# Patient Record
Sex: Female | Born: 1935 | Race: White | Hispanic: No | Smoking: Never smoker
Health system: Southern US, Community
[De-identification: ages and names within clinical notes are randomized; demographics above are authoritative.]

## PROBLEM LIST (undated history)

## (undated) DIAGNOSIS — N189 Chronic kidney disease, unspecified: Secondary | ICD-10-CM

## (undated) DIAGNOSIS — R001 Bradycardia, unspecified: Secondary | ICD-10-CM

## (undated) DIAGNOSIS — I5030 Unspecified diastolic (congestive) heart failure: Secondary | ICD-10-CM

## (undated) DIAGNOSIS — E039 Hypothyroidism, unspecified: Secondary | ICD-10-CM

## (undated) DIAGNOSIS — M5136 Other intervertebral disc degeneration, lumbar region: Secondary | ICD-10-CM

## (undated) DIAGNOSIS — M4120 Other idiopathic scoliosis, site unspecified: Secondary | ICD-10-CM

## (undated) DIAGNOSIS — I509 Heart failure, unspecified: Secondary | ICD-10-CM

## (undated) DIAGNOSIS — I480 Paroxysmal atrial fibrillation: Secondary | ICD-10-CM

## (undated) DIAGNOSIS — M549 Dorsalgia, unspecified: Secondary | ICD-10-CM

## (undated) DIAGNOSIS — T8859XA Other complications of anesthesia, initial encounter: Secondary | ICD-10-CM

## (undated) DIAGNOSIS — I4891 Unspecified atrial fibrillation: Secondary | ICD-10-CM

## (undated) DIAGNOSIS — M810 Age-related osteoporosis without current pathological fracture: Secondary | ICD-10-CM

## (undated) DIAGNOSIS — M51369 Other intervertebral disc degeneration, lumbar region without mention of lumbar back pain or lower extremity pain: Secondary | ICD-10-CM

## (undated) DIAGNOSIS — G459 Transient cerebral ischemic attack, unspecified: Secondary | ICD-10-CM

## (undated) DIAGNOSIS — R011 Cardiac murmur, unspecified: Secondary | ICD-10-CM

## (undated) DIAGNOSIS — I10 Essential (primary) hypertension: Secondary | ICD-10-CM

## (undated) DIAGNOSIS — G500 Trigeminal neuralgia: Secondary | ICD-10-CM

## (undated) DIAGNOSIS — G8929 Other chronic pain: Secondary | ICD-10-CM

## (undated) DIAGNOSIS — T4145XA Adverse effect of unspecified anesthetic, initial encounter: Secondary | ICD-10-CM

## (undated) DIAGNOSIS — E079 Disorder of thyroid, unspecified: Secondary | ICD-10-CM

## (undated) DIAGNOSIS — M1990 Unspecified osteoarthritis, unspecified site: Secondary | ICD-10-CM

## (undated) DIAGNOSIS — J90 Pleural effusion, not elsewhere classified: Secondary | ICD-10-CM

## (undated) DIAGNOSIS — Z95810 Presence of automatic (implantable) cardiac defibrillator: Secondary | ICD-10-CM

## (undated) DIAGNOSIS — R2681 Unsteadiness on feet: Secondary | ICD-10-CM

## (undated) DIAGNOSIS — I639 Cerebral infarction, unspecified: Secondary | ICD-10-CM

## (undated) DIAGNOSIS — G9340 Encephalopathy, unspecified: Secondary | ICD-10-CM

## (undated) DIAGNOSIS — I5033 Acute on chronic diastolic (congestive) heart failure: Secondary | ICD-10-CM

## (undated) DIAGNOSIS — J189 Pneumonia, unspecified organism: Secondary | ICD-10-CM

## (undated) DIAGNOSIS — K9041 Non-celiac gluten sensitivity: Secondary | ICD-10-CM

## (undated) HISTORY — DX: Non-celiac gluten sensitivity: K90.41

## (undated) HISTORY — DX: Other intervertebral disc degeneration, lumbar region without mention of lumbar back pain or lower extremity pain: M51.369

## (undated) HISTORY — DX: Unsteadiness on feet: R26.81

## (undated) HISTORY — PX: PACEMAKER INSERTION: SHX728

## (undated) HISTORY — DX: Other idiopathic scoliosis, site unspecified: M41.20

## (undated) HISTORY — DX: Chronic kidney disease, unspecified: N18.9

## (undated) HISTORY — DX: Encephalopathy, unspecified: G93.40

## (undated) HISTORY — PX: TOENAIL AVULSION: SHX1074

## (undated) HISTORY — PX: CATARACT EXTRACTION W/ INTRAOCULAR LENS  IMPLANT, BILATERAL: SHX1307

## (undated) HISTORY — DX: Other intervertebral disc degeneration, lumbar region: M51.36

---
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
## 2009-12-25 DIAGNOSIS — J90 Pleural effusion, not elsewhere classified: Secondary | ICD-10-CM

## 2009-12-25 HISTORY — DX: Pleural effusion, not elsewhere classified: J90

## 2013-08-25 HISTORY — PX: LOOP RECORDER IMPLANT: SHX5954

## 2014-07-14 ENCOUNTER — Ambulatory Visit
Admission: RE | Admit: 2014-07-14 | Discharge: 2014-07-14 | Disposition: A | Discharge status: Home / Self Care | Payer: PRIVATE HEALTH INSURANCE | Source: Ambulatory Visit | Attending: Family Medicine | Admitting: Family Medicine

## 2014-07-14 ENCOUNTER — Other Ambulatory Visit: Payer: Self-pay | Admitting: Family Medicine

## 2014-07-14 DIAGNOSIS — M545 Low back pain, unspecified: Secondary | ICD-10-CM

## 2014-12-13 ENCOUNTER — Emergency Department (HOSPITAL_COMMUNITY): Payer: Medicare PPO

## 2014-12-13 ENCOUNTER — Encounter (HOSPITAL_COMMUNITY): Payer: Self-pay | Admitting: Nurse Practitioner

## 2014-12-13 ENCOUNTER — Inpatient Hospital Stay (HOSPITAL_COMMUNITY)
Admission: EM | Admit: 2014-12-13 | Discharge: 2014-12-16 | DRG: 193 | Payer: Medicare PPO | Attending: Internal Medicine | Admitting: Internal Medicine

## 2014-12-13 DIAGNOSIS — Z8673 Personal history of transient ischemic attack (TIA), and cerebral infarction without residual deficits: Secondary | ICD-10-CM

## 2014-12-13 DIAGNOSIS — J189 Pneumonia, unspecified organism: Secondary | ICD-10-CM | POA: Diagnosis not present

## 2014-12-13 DIAGNOSIS — I480 Paroxysmal atrial fibrillation: Secondary | ICD-10-CM | POA: Diagnosis present

## 2014-12-13 DIAGNOSIS — R0602 Shortness of breath: Secondary | ICD-10-CM | POA: Diagnosis present

## 2014-12-13 DIAGNOSIS — I5031 Acute diastolic (congestive) heart failure: Secondary | ICD-10-CM | POA: Diagnosis present

## 2014-12-13 DIAGNOSIS — I4891 Unspecified atrial fibrillation: Secondary | ICD-10-CM | POA: Diagnosis present

## 2014-12-13 DIAGNOSIS — I509 Heart failure, unspecified: Secondary | ICD-10-CM

## 2014-12-13 DIAGNOSIS — G500 Trigeminal neuralgia: Secondary | ICD-10-CM

## 2014-12-13 DIAGNOSIS — M419 Scoliosis, unspecified: Secondary | ICD-10-CM | POA: Diagnosis present

## 2014-12-13 DIAGNOSIS — R232 Flushing: Secondary | ICD-10-CM

## 2014-12-13 DIAGNOSIS — R609 Edema, unspecified: Secondary | ICD-10-CM

## 2014-12-13 DIAGNOSIS — G459 Transient cerebral ischemic attack, unspecified: Secondary | ICD-10-CM

## 2014-12-13 HISTORY — DX: Trigeminal neuralgia: G50.0

## 2014-12-13 HISTORY — DX: Pneumonia, unspecified organism: J18.9

## 2014-12-13 HISTORY — DX: Transient cerebral ischemic attack, unspecified: G45.9

## 2014-12-13 LAB — I-STAT TROPONIN, ED: Troponin i, poc: 0 ng/mL (ref 0.00–0.08)

## 2014-12-13 LAB — CBC
HEMATOCRIT: 38.9 % (ref 36.0–46.0)
HEMOGLOBIN: 13.3 g/dL (ref 12.0–15.0)
MCH: 31.2 pg (ref 26.0–34.0)
MCHC: 34.2 g/dL (ref 30.0–36.0)
MCV: 91.3 fL (ref 78.0–100.0)
Platelets: 233 10*3/uL (ref 150–400)
RBC: 4.26 MIL/uL (ref 3.87–5.11)
RDW: 14.4 % (ref 11.5–15.5)
WBC: 9.3 10*3/uL (ref 4.0–10.5)

## 2014-12-13 LAB — BASIC METABOLIC PANEL
Anion gap: 14 (ref 5–15)
BUN: 12 mg/dL (ref 6–23)
CALCIUM: 8.7 mg/dL (ref 8.4–10.5)
CO2: 24 mEq/L (ref 19–32)
Chloride: 94 mEq/L — ABNORMAL LOW (ref 96–112)
Creatinine, Ser: 0.43 mg/dL — ABNORMAL LOW (ref 0.50–1.10)
GFR calc Af Amer: >90 mL/min (ref >90)
GFR, EST NON AFRICAN AMERICAN: 85 mL/min — AB (ref >90)
GLUCOSE: 80 mg/dL (ref 70–99)
Potassium: 4.2 mEq/L (ref 3.7–5.3)
Sodium: 132 mEq/L — ABNORMAL LOW (ref 137–147)

## 2014-12-13 LAB — CBG MONITORING, ED
GLUCOSE-CAPILLARY: 69 mg/dL — AB (ref 70–99)
GLUCOSE-CAPILLARY: 116 mg/dL — AB (ref 70–99)

## 2014-12-13 LAB — PRO B NATRIURETIC PEPTIDE: Pro B Natriuretic peptide (BNP): 2339 pg/mL — ABNORMAL HIGH (ref 0–450)

## 2014-12-13 MED ORDER — FUROSEMIDE 10 MG/ML IJ SOLN
40.0000 mg | Freq: Two times a day (BID) | INTRAMUSCULAR | Status: DC
  Administered 2014-12-13 – 2014-12-15 (×4): 40 mg via INTRAVENOUS
  Filled 2014-12-13 (×6): qty 4

## 2014-12-13 MED ORDER — FUROSEMIDE 10 MG/ML IJ SOLN
40.0000 mg | Freq: Once | INTRAMUSCULAR | Status: AC
  Administered 2014-12-13: 40 mg via INTRAVENOUS
  Filled 2014-12-13: qty 4

## 2014-12-13 MED ORDER — POLYETHYLENE GLYCOL 3350 17 G PO PACK
17.0000 g | PACK | Freq: Every day | ORAL | Status: DC | PRN
  Filled 2014-12-13: qty 1

## 2014-12-13 MED ORDER — METOPROLOL SUCCINATE ER 25 MG PO TB24: 25.0000 mg | ORAL_TABLET | Freq: Every day | ORAL | Status: DC

## 2014-12-13 MED ORDER — ACETAMINOPHEN 650 MG RE SUPP
650.0000 mg | Freq: Four times a day (QID) | RECTAL | Status: DC | PRN
Start: 2014-12-13 — End: 2014-12-16

## 2014-12-13 MED ORDER — PROGESTERONE MICRONIZED 100 MG PO CAPS
100.0000 mg | ORAL_CAPSULE | Freq: Every day | ORAL | Status: DC
  Administered 2014-12-13: 100 mg via ORAL
  Filled 2014-12-13 (×4): qty 1

## 2014-12-13 MED ORDER — CYCLOSPORINE 0.05 % OP EMUL
1.0000 [drp] | Freq: Every day | OPHTHALMIC | Status: DC
  Administered 2014-12-13 – 2014-12-15 (×3): 1 [drp] via OPHTHALMIC
  Filled 2014-12-13 (×4): qty 1

## 2014-12-13 MED ORDER — SODIUM CHLORIDE 0.9 % IJ SOLN
3.0000 mL | Freq: Two times a day (BID) | INTRAMUSCULAR | Status: DC
  Administered 2014-12-13 – 2014-12-16 (×4): 3 mL via INTRAVENOUS

## 2014-12-13 MED ORDER — SODIUM CHLORIDE 0.9 % IJ SOLN
3.0000 mL | Freq: Two times a day (BID) | INTRAMUSCULAR | Status: DC
  Administered 2014-12-13 – 2014-12-16 (×6): 3 mL via INTRAVENOUS

## 2014-12-13 MED ORDER — LORATADINE 10 MG PO TABS
10.0000 mg | ORAL_TABLET | Freq: Every day | ORAL | Status: DC
Start: 2014-12-13 — End: 2014-12-16
  Administered 2014-12-13 – 2014-12-14 (×2): 10 mg via ORAL
  Filled 2014-12-13 (×4): qty 1

## 2014-12-13 MED ORDER — DEXTROSE 5 % IV SOLN
500.0000 mg | INTRAVENOUS | Status: DC
  Administered 2014-12-13 – 2014-12-15 (×3): 500 mg via INTRAVENOUS
  Filled 2014-12-13 (×4): qty 500

## 2014-12-13 MED ORDER — CLOPIDOGREL BISULFATE 75 MG PO TABS
75.0000 mg | ORAL_TABLET | Freq: Every day | ORAL | Status: DC
  Administered 2014-12-13 – 2014-12-15 (×3): 75 mg via ORAL
  Filled 2014-12-13 (×4): qty 1

## 2014-12-13 MED ORDER — LEVOFLOXACIN IN D5W 750 MG/150ML IV SOLN
750.0000 mg | Freq: Once | INTRAVENOUS | Status: AC
  Administered 2014-12-13: 750 mg via INTRAVENOUS
  Filled 2014-12-13: qty 150

## 2014-12-13 MED ORDER — ALUM & MAG HYDROXIDE-SIMETH 200-200-20 MG/5ML PO SUSP: 30.0000 mL | Freq: Four times a day (QID) | ORAL | Status: DC | PRN

## 2014-12-13 MED ORDER — METOPROLOL SUCCINATE ER 50 MG PO TB24
50.0000 mg | ORAL_TABLET | Freq: Every day | ORAL | Status: DC
  Administered 2014-12-13 – 2014-12-15 (×3): 50 mg via ORAL
  Filled 2014-12-13 (×3): qty 1

## 2014-12-13 MED ORDER — ONDANSETRON HCL 4 MG/2ML IJ SOLN: 4.0000 mg | Freq: Four times a day (QID) | INTRAMUSCULAR | Status: DC | PRN

## 2014-12-13 MED ORDER — ENOXAPARIN SODIUM 40 MG/0.4ML ~~LOC~~ SOLN: 40.0000 mg | SUBCUTANEOUS | Status: DC

## 2014-12-13 MED ORDER — SODIUM CHLORIDE 0.9 % IJ SOLN: 3.0000 mL | INTRAMUSCULAR | Status: DC | PRN

## 2014-12-13 MED ORDER — PREGABALIN 25 MG PO CAPS
75.0000 mg | ORAL_CAPSULE | Freq: Two times a day (BID) | ORAL | Status: DC
  Administered 2014-12-13 – 2014-12-16 (×6): 75 mg via ORAL
  Filled 2014-12-13 (×6): qty 3

## 2014-12-13 MED ORDER — ESTRADIOL 1 MG PO TABS
0.5000 mg | ORAL_TABLET | Freq: Every day | ORAL | Status: DC
  Administered 2014-12-13 – 2014-12-14 (×2): 0.5 mg via ORAL
  Filled 2014-12-13 (×4): qty 0.5

## 2014-12-13 MED ORDER — SELENIUM 50 MCG PO TABS
50.0000 ug | ORAL_TABLET | Freq: Every day | ORAL | Status: DC
  Administered 2014-12-13 – 2014-12-15 (×2): 50 ug via ORAL
  Filled 2014-12-13 (×4): qty 1

## 2014-12-13 MED ORDER — DEXTROSE 5 % IV SOLN
1.0000 g | INTRAVENOUS | Status: DC
  Administered 2014-12-13 – 2014-12-15 (×3): 1 g via INTRAVENOUS
  Filled 2014-12-13 (×4): qty 10

## 2014-12-13 MED ORDER — SODIUM CHLORIDE 0.9 % IV SOLN: 250.0000 mL | INTRAVENOUS | Status: DC | PRN

## 2014-12-13 MED ORDER — ONDANSETRON HCL 4 MG PO TABS: 4.0000 mg | ORAL_TABLET | Freq: Four times a day (QID) | ORAL | Status: DC | PRN

## 2014-12-13 MED ORDER — DIPHENHYDRAMINE HCL 50 MG/ML IJ SOLN
25.0000 mg | Freq: Four times a day (QID) | INTRAMUSCULAR | Status: DC | PRN
  Administered 2014-12-13: 25 mg via INTRAVENOUS
  Filled 2014-12-13: qty 1

## 2014-12-13 MED ORDER — METOPROLOL SUCCINATE ER 25 MG PO TB24
25.0000 mg | ORAL_TABLET | Freq: Once | ORAL | Status: AC
  Administered 2014-12-13: 25 mg via ORAL

## 2014-12-13 MED ORDER — ACETAMINOPHEN 325 MG PO TABS: 650.0000 mg | ORAL_TABLET | Freq: Four times a day (QID) | ORAL | Status: DC | PRN

## 2014-12-13 NOTE — ED Notes (Signed)
Called to give report. RN to call back. 

## 2014-12-13 NOTE — ED Provider Notes (Signed)
CSN: [id number]     Arrival date & time 12/13/14  1157 History   First MD Initiated Contact with Patient 12/13/14 1231     Chief Complaint  Patient presents with  . Shortness of Breath     (Consider location/radiation/quality/duration/timing/severity/associated sxs/prior Treatment) HPI  Pt is a 80yo with h/o irregular heart beat and TIA who presents for increased shortness of breath following pneumonia diagnosis 2 days ago. She reports that recently she has had some cough and runny nose/congestion but no fevers. She was placed on azithro for CAP, 2 days ago by her PCP.   Patient's daughter, Dr. Selena BattenWendy McNeil, was present at the bedside and reports she has had intermittent afib over the past few years but nothing sustained and has never been on anticoagulation. She is on metoprolol for primarily sinus tachycardia.  Past Medical History  Diagnosis Date  . Sinus tachycardia   . Pneumonia   . Scoliosis   . TIA (transient ischemic attack)    Past Surgical History  Procedure Laterality Date  . Tonsillectomy     No family history on file. History  Substance Use Topics  . Smoking status: Never Smoker   . Smokeless tobacco: Not on file  . Alcohol Use: No   OB History    No data available     Review of Systems No fever, CP. No n/v/d.   Allergies  Codeine and Xylocaine  Home Medications   Prior to Admission medications   Not on File   BP 137/82 mmHg  Pulse 104  Temp(Src) 97.8 F (36.6 C) (Oral)  Resp 20  Ht 4\' 7"  (1.397 m)  Wt 100 lb (45.36 kg)  BMI 23.24 kg/m2  SpO2 99% Physical Exam  ED Course  Procedures (including critical care time) Labs Review Labs Reviewed  CBC  BASIC METABOLIC PANEL  PRO B NATRIURETIC PEPTIDE  I-STAT TROPOININ, ED    Imaging Review Dg Chest 2 View  12/13/2014   CLINICAL DATA:  Cough and difficulty breathing for 2 days  EXAM: CHEST  2 VIEW  COMPARISON:  December 11, 2014  FINDINGS: There is underlying emphysematous change. There is  again noted opacity in the lateral left base, probably representing a small area of consolidation with small effusion. Lungs elsewhere clear. Heart is mildly enlarged with pulmonary vascularity within normal limits. No adenopathy. Bones are osteoporotic. There is a focal area of calcification in the left carotid artery.  IMPRESSION: Opacity lateral left base, upright representing combination of focal infiltrate with small effusion. Lungs elsewhere clear. Underlying emphysema. Stable cardiomegaly.   Electronically Signed   By: Bretta BangWilliam  Woodruff M.D.   On: 12/13/2014 13:22     EKG Interpretation   Date/Time:  Sunday December 13 2014 12:09:57 EST Ventricular Rate:  113 PR Interval:    QRS Duration: 92 QT Interval:  326 QTC Calculation: 447 R Axis:   -75 Text Interpretation:  Atrial fibrillation with rapid ventricular response  Left anterior fascicular block Possible Anterior infarct , age  undetermined Abnormal ECG Confirmed by BEATON  MD, ROBERT ([id number]) on  12/13/2014 12:32:05 PM      MDM   Final diagnoses:  SOB (shortness of breath)   SOB, found to be in afib with RVR. Unclear whether pneumonia or afib is the primary etiology. CXR with LLL infiltrate plus small effusion. Awaiting BNP, trop, CBC, BMP.  BNP elevation and SOB with afib concerning for new onset heart failure. Lasix ordered. Will consult cardiology and call [facility] for admission  Abram SanderElena M Yehudah Standing, MD 12/14/14 1723  Nelia Shiobert L Beaton, MD 12/21/14 [phone]

## 2014-12-13 NOTE — ED Notes (Signed)
Pt has returned from being out of the department; pt placed back on the monitor, continuous pulse oximetry and blood pressure cuff

## 2014-12-13 NOTE — H&P (Addendum)
[facility] History and Physical  Melanie Chancellorrene H Boman XBJ:[id number] DOB: [birth year] DOA: 12/13/2014   PCP: Gaye AlkenBARNES,ELIZABETH STEWART, MD    Chief Complaint: Chest tightness  HPI: Melanie Obrien is a 80 y.o. female with past medical history of TIA, trigeminal neuralgia, hot flashes and severe scoliosis who presents with an episode of tightness in the center of her chest associated with some shortness of breath. The patient's daughter who is an M.D. is at the bedside and assisting with history.   The patient came to the ER because of the chest tightness and was noted to be in A. fib with RVR. Rate has slowed down spontaneously and is now in the 80s. She also gives a history of a cough which has been going on for a few days now. According to the daughter who is at the bedside, she coughed up some bloody sputum yesterday but otherwise has had a nonproductive cough. The patient does not have any other type of chest pain and has not had any fevers chills or sweats or other shortness of breath. She has had pneumonia once in the past with some loculations secondary to mucous plugging. Is not have a history of COPD or asthma but most likely has restrictive lung disease secondary to scoliosis.  Of note the daughter would also like to mention that the patient tends to have a "personality change" towards the evening. The patient does not have any dementia or problems with sundowning.   General: The patient denies anorexia, fever, weight loss Cardiac: Denie syncope, palpitations, pedal edema  Respiratory: Denies wheezing GI: Denies severe indigestion/heartburn, abdominal pain, nausea, vomiting, diarrhea and constipation- has Gluten intolerance GU: Denies hematuria, incontinence, dysuria  Musculoskeletal: has arthritis - does not take any medication for it Skin: Denies suspicious skin lesions Neurologic: Denies focal weakness or numbness, change in vision Psychiatry: Denies depression or anxiety.    Past Medical History  Diagnosis Date  . Pneumonia   . Scoliosis   . TIA (transient ischemic attack)   . Trigeminal neuralgia of left side of face after tooth extraction    . Hot flashes     Past Surgical History  Procedure Laterality Date  . Tonsillectomy      Social History: never smoked, does not drink alcohol Lives in independent living at [facility].  Uses a walker for her posture.    Allergies  Allergen Reactions  . Codeine Other (See Comments)    unknown  . Xylocaine [Lidocaine Hcl] Other (See Comments)    Uncontrolled shaking    Family history- none   Prior to Admission medications   Medication Sig Start Date End Date Taking? Authorizing Provider  Ascorbic Acid (VITAMIN C PO) Take 1 tablet by mouth daily.   Yes Historical Provider, MD  azithromycin (ZITHROMAX) 250 MG tablet Take 250-500 mg by mouth daily. 500mg  on the first day, then take 1 tablet daily   Yes Historical Provider, MD  b complex vitamins tablet Take 1 tablet by mouth daily.   Yes Historical Provider, MD  BETA CAROTENE PO Take 1 tablet by mouth daily.   Yes Historical Provider, MD  CALCIUM PO Take 1 tablet by mouth daily.   Yes Historical Provider, MD  CHROMIUM PO Take 1 tablet by mouth daily.   Yes Historical Provider, MD  clopidogrel (PLAVIX) 75 MG tablet Take 75 mg by mouth daily.   Yes Historical Provider, MD  Cyanocobalamin (VITAMIN B 12 PO) Take 1 tablet by mouth daily.   Yes Historical  Provider, MD  cycloSPORINE (RESTASIS) 0.05 % ophthalmic emulsion Place 1 drop into both eyes daily.   Yes Historical Provider, MD  estradiol (ESTRACE) 1 MG tablet Take 0.5 mg by mouth daily.   Yes Historical Provider, MD  fexofenadine (ALLEGRA) 180 MG tablet Take 180 mg by mouth daily.   Yes Historical Provider, MD  MAGNESIUM PO Take 1 tablet by mouth daily.   Yes Historical Provider, MD  metoprolol succinate (TOPROL-XL) 25 MG 24 hr tablet Take 25 mg by mouth daily.   Yes Historical Provider, MD  Multiple  Vitamins-Minerals (MULTIVITAMIN PO) Take 1 tablet by mouth daily.   Yes Historical Provider, MD  Multiple Vitamins-Minerals (ZINC PO) Take 1 tablet by mouth daily.   Yes Historical Provider, MD  PANTOTHENIC ACID PO Take 1 tablet by mouth daily.   Yes Historical Provider, MD  pregabalin (LYRICA) 75 MG capsule Take 75 mg by mouth 2 (two) times daily.   Yes Historical Provider, MD  progesterone (PROMETRIUM) 100 MG capsule Take 100 mg by mouth daily.   Yes Historical Provider, MD  selenium 50 MCG TABS tablet Take 50 mcg by mouth daily.   Yes Historical Provider, MD  VITAMIN A PO Take 1 tablet by mouth daily.   Yes Historical Provider, MD  VITAMIN E PO Take 1 tablet by mouth daily.   Yes Historical Provider, MD     Physical Exam: Filed Vitals:   12/13/14 1525 12/13/14 1530 12/13/14 1630 12/13/14 1706  BP: 148/83 130/87 123/53 129/73  Pulse: 111 107 86 85  Temp:      TempSrc:      Resp: 18 17 10 17   Height:      Weight:      SpO2: 94% 95% 95% 94%     General: Awake alert oriented 3, heart of hearing, no acute distress HEENT: Normocephalic and Atraumatic, Mucous membranes pink -tongue is dry                PERRLA; EOM intact; No scleral icterus,                 Nares: Patent, Oropharynx: Clear, Fair Dentition                 Neck: FROM, no cervical lymphadenopathy, thyromegaly, carotid bruit or JVD;  Breasts: deferred CHEST WALL: No tenderness  CHEST: Normal respiration, mild crackles in the left lung base-no wheezing-oxygenating in the mid 90s on room air HEART: Regular rate and rhythm; no murmurs rubs or gallops  BACK: + scoliosis; no CVA tenderness  ABDOMEN: Positive Bowel Sounds, soft, non-tender; no masses, no organomegaly Rectal Exam: deferred EXTREMITIES: No cyanosis, clubbing, or edema Genitalia: not examined  SKIN:  no rash or ulceration  CNS: Alert and Oriented x 4, Nonfocal exam, CN 2-12 intact  Labs on Admission:  Basic Metabolic Panel:  Recent Labs Lab  12/13/14 1405  NA 132*  K 4.2  CL 94*  CO2 24  GLUCOSE 80  BUN 12  CREATININE 0.43*  CALCIUM 8.7   Liver Function Tests: No results for input(s): AST, ALT, ALKPHOS, BILITOT, PROT, ALBUMIN in the last 168 hours. No results for input(s): LIPASE, AMYLASE in the last 168 hours. No results for input(s): AMMONIA in the last 168 hours. CBC:  Recent Labs Lab 12/13/14 1405  WBC 9.3  HGB 13.3  HCT 38.9  MCV 91.3  PLT 233   Cardiac Enzymes: No results for input(s): CKTOTAL, CKMB, CKMBINDEX, TROPONINI in the last 168 hours.  BNP (  last 3 results)  Recent Labs  12/13/14 1405  PROBNP 2339.0*   CBG:  Recent Labs Lab 12/13/14 1413 12/13/14 1537  GLUCAP 69* 116*    Radiological Exams on Admission: Dg Chest 2 View  12/13/2014   CLINICAL DATA:  Cough and difficulty breathing for 2 days  EXAM: CHEST  2 VIEW  COMPARISON:  December 11, 2014  FINDINGS: There is underlying emphysematous change. There is again noted opacity in the lateral left base, probably representing a small area of consolidation with small effusion. Lungs elsewhere clear. Heart is mildly enlarged with pulmonary vascularity within normal limits. No adenopathy. Bones are osteoporotic. There is a focal area of calcification in the left carotid artery.  IMPRESSION: Opacity lateral left base, upright representing combination of focal infiltrate with small effusion. Lungs elsewhere clear. Underlying emphysema. Stable cardiomegaly.   Electronically Signed   By: Bretta Bang M.D.   On: 12/13/2014 13:22    EKG: Independently reviewed. A-fib at 115 bpm  Assessment/Plan Principal Problem:   Pneumonia -Community acquired- left lower lobe -will place her on Levaquin -Other orders per pneumonia protocol include strep and Legionella antigens, sputum culture and HIV  Active Problems:   Atrial fibrillation/ CHF unspecified -Per cardiology notes, the plan is to increase Toprol from 25 mg to 50 mg daily-I will give her a  dose of 25 now as she already took 25 this morning and then increase the dose for tomorrow morning -She is currently rate controlled -Will place her on Lovenox for anticoagulation until a decision can be made in regards to more permanent anticoagulation -2-D echo has been ordered -Thyroid studies will be ordered for tomorrow morning -Cardiology is following- lasix ordered by cardiology- f/u on K+    TIA (transient ischemic attack) -Noted that she is not on an aspirin or a statin    Trigeminal neuralgia of left side of face -Continue Lyrica    Hot flashes -Continue estrogen and progesterone  Personality changes in the evening - Per history obtained from daughter, this is not sundowning  Consulted: [facility]  Code Status: full code  Family Communication: Selena Batten, MD - daughter ([place])- [phone] is at the bedside in case has been discussed with DVT Prophylaxis: Lovenox  Time spent: 65 minutes  Diontre Harps, MD [facility]  If 7PM-7AM, please contact night-coverage [email or web address] 12/13/2014, 5:30 PM

## 2014-12-13 NOTE — Progress Notes (Signed)
ANTIBIOTIC CONSULT NOTE - INITIAL  Pharmacy Consult for Ceftriaxone and Azithromycin Indication: CAP  Allergies  Allergen Reactions  . Codeine Other (See Comments)    unknown  . Levaquin [Levofloxacin In D5w] Itching  . Xylocaine [Lidocaine Hcl] Other (See Comments)    Uncontrolled shaking    Patient Measurements: Height: 4\' 7"  (139.7 cm) Weight: 100 lb (45.36 kg) IBW/kg (Calculated) : 34 Adjusted Body Weight:   Vital Signs: Temp: 97.8 F (36.6 C) (12/20 1209) Temp Source: Oral (12/20 1209) BP: 126/89 mmHg (12/20 1800) Pulse Rate: 120 (12/20 1800) Intake/Output from previous day:   Intake/Output from this shift: Total I/O In: -  Out: 2325 [Urine:2325]  Labs:  Recent Labs  12/13/14 1405  WBC 9.3  HGB 13.3  PLT 233  CREATININE 0.43*   Estimated Creatinine Clearance: 26.8 mL/min (by C-G formula based on Cr of 0.43). No results for input(s): VANCOTROUGH, VANCOPEAK, VANCORANDOM, GENTTROUGH, GENTPEAK, GENTRANDOM, TOBRATROUGH, TOBRAPEAK, TOBRARND, AMIKACINPEAK, AMIKACINTROU, AMIKACIN in the last 72 hours.   Microbiology: No results found for this or any previous visit (from the past 720 hour(s)).  Medical History: Past Medical History  Diagnosis Date  . Sinus tachycardia   . Pneumonia   . Scoliosis   . TIA (transient ischemic attack)   . Trigeminal neuralgia of left side of face 12/13/2014  . Hot flashes 12/13/2014    Medications:  See electronic med rec  Assessment: 89yof admitted for possible CAP. Patient was started on Levofloxacin but developed an allergic reaction. Pharmacy now consulted to start Azithromycin and Ceftriaxone for CAP. - Afeb, WBC wnl - Crcl 27    Plan:  1. Ceftriaxone 1g IV q24h 2. Azithromycin 500mg  IV q24h 3. No renal dose adjustment needed on these antibiotics. Pharmacy will sign off. Please reconsult if additional assistance is needed.   Cleon DewDulaney, [place] Robert  [phone] 12/13/2014,6:19 PM

## 2014-12-13 NOTE — ED Provider Notes (Signed)
Cardiology has called, believe pna is primary process. Dr. Butler Denmarkizwan w/ [facility] will admit, will start levaquin. Pt feeling somewhat improved after lasix.   1. Acute heart failure, unspecified heart failure type   2. SOB (shortness of breath)   3. Atrial fibrillation with RVR   4. CAP (community acquired pneumonia)      Toy CookeyMegan Haja Crego, MD 12/13/14 [phone]

## 2014-12-13 NOTE — Consult Note (Signed)
CARDIOLOGY CONSULT NOTE      Patient ID: Melanie Obrien MRN: [id number] DOB/AGE: 07/30/1919 80 y.o.  Admit date: 12/13/2014 Referring Rudean CurtPhysicianMegan Docherty, MD Primary Elson ClanPhysicianBARNES,ELIZABETH STEWART, MD Primary Cardiologist Eldridge DaceVaranasi Reason for Consultation shortness of breath  HPI: 80 year old woman who is known to me. She has had intermittent atrial fibrillation in the past. Over the course of the past several days, she had been short of breath. She was seen by her primary care doctor. A chest x-ray was done which showed a left lower lobe pneumonia. She was started on azithromycin. She took several doses but did not have any improvement. She noticed some increased palpitations as well. She noticed some lower extremity edema. Earlier today, she called her daughter because she could not walk within her house without being significantly short of breath. She presented to the emergency room. She has received a dose of Lasix. Repeat chest x-ray was performed.  Review of systems complete and found to be negative unless listed above   Past Medical History  Diagnosis Date  . Sinus tachycardia   . Pneumonia   . Scoliosis   . TIA (transient ischemic attack)     No family history on file.  History   Social History  . Marital Status: Widowed    Spouse Name: N/A    Number of Children: N/A  . Years of Education: N/A   Occupational History  . Not on file.   Social History Main Topics  . Smoking status: Never Smoker   . Smokeless tobacco: Not on file  . Alcohol Use: No  . Drug Use: No  . Sexual Activity: Not on file   Other Topics Concern  . Not on file   Social History Narrative  . No narrative on file    Past Surgical History  Procedure Laterality Date  . Tonsillectomy        (Not in a hospital admission)  Physical Exam: Vitals:   Filed Vitals:   12/13/14 1445 12/13/14 1524 12/13/14 1525 12/13/14 1530  BP: 151/77 135/111 148/83 130/87  Pulse: 105 107 111 107    Temp:      TempSrc:      Resp: 15 34 18 17  Height:      Weight:      SpO2: 96% 96% 94% 95%   I&O's:   Intake/Output Summary (Last 24 hours) at 12/13/14 1634 Last data filed at 12/13/14 1609  Gross per 24 hour  Intake      0 ml  Output   1050 ml  Net  -1050 ml   Physical exam: Frail, comfortable [place]/AT EOMI No JVD, No carotid bruit Irregularly irregular S1S2  No wheezing Soft. NT, nondistended No edema. No focal motor or sensory deficits Normal affect No rash  Labs:   Lab Results  Component Value Date   WBC 9.3 12/13/2014   HGB 13.3 12/13/2014   HCT 38.9 12/13/2014   MCV 91.3 12/13/2014   PLT 233 12/13/2014    Recent Labs Lab 12/13/14 1405  NA 132*  K 4.2  CL 94*  CO2 24  BUN 12  CREATININE 0.43*  CALCIUM 8.7  GLUCOSE 80   No results found for: CKTOTAL, CKMB, CKMBINDEX, TROPONINI No results found for: CHOL No results found for: HDL No results found for: LDLCALC No results found for: TRIG No results found for: CHOLHDL No results found for: LDLDIRECT    Radiology:  Left lower lobe pneumonia EKG:  Atrial fibrillation with rapid ventricular response.  ASSESSMENT AND PLAN:    Pneumonia: She may need a different antibiotic compared to azithromycin. I personally reviewed her chest x-ray from today as well as 2 days ago. Will defer to the hospitalist service.  Atrial fibrillation with rapid ventricular response: We'll obtain echocardiogram. I suspect some of this is diastolic dysfunction due to increased heart rate. The trigger for her atrial fibrillation was likely the pneumonia. Will increase metoprolol to metoprolol XL 50 mg daily at this point.  Edema: She has received IV Lasix. She will likely need several doses of IV Lasix to help with her fluid overload. BNP is also elevated.  Will follow along. Signed:   Fredric MareJay S. Yamil Oelke, MD, [facility] 12/13/2014, 4:34 PM

## 2014-12-13 NOTE — ED Notes (Addendum)
Pt started to have red streaking along veins after Levaquin was started. Pt c/o itching at the site. Antibiotic stopped and Dr Butler Denmarkizwan notified. IV is still patent with good blood return.

## 2014-12-13 NOTE — ED Notes (Addendum)
Daughter brought the pt today for increasing sob over this weekend. She was seen by PCP last week and started on zpak for possible pneumonia. She c/o mild discomfort in chest. Daughter went to check on her today and she could not walk across room without becoming SOB and increased heart rate. She is A&OX4, BREATHING EASILY NOW

## 2014-12-14 ENCOUNTER — Encounter (HOSPITAL_COMMUNITY): Payer: Self-pay

## 2014-12-14 DIAGNOSIS — G500 Trigeminal neuralgia: Secondary | ICD-10-CM

## 2014-12-14 DIAGNOSIS — R0602 Shortness of breath: Secondary | ICD-10-CM

## 2014-12-14 DIAGNOSIS — I359 Nonrheumatic aortic valve disorder, unspecified: Secondary | ICD-10-CM

## 2014-12-14 DIAGNOSIS — G459 Transient cerebral ischemic attack, unspecified: Secondary | ICD-10-CM

## 2014-12-14 LAB — CBC
HEMATOCRIT: 35 % — AB (ref 36.0–46.0)
HEMOGLOBIN: 11.9 g/dL — AB (ref 12.0–15.0)
MCH: 31.2 pg (ref 26.0–34.0)
MCHC: 34 g/dL (ref 30.0–36.0)
MCV: 91.9 fL (ref 78.0–100.0)
Platelets: 221 10*3/uL (ref 150–400)
RBC: 3.81 MIL/uL — ABNORMAL LOW (ref 3.87–5.11)
RDW: 14.8 % (ref 11.5–15.5)
WBC: 8.2 10*3/uL (ref 4.0–10.5)

## 2014-12-14 LAB — HIV ANTIBODY (ROUTINE TESTING W REFLEX): HIV 1&2 Ab, 4th Generation: NONREACTIVE

## 2014-12-14 LAB — BASIC METABOLIC PANEL
Anion gap: 12 (ref 5–15)
BUN: 15 mg/dL (ref 6–23)
CHLORIDE: 97 meq/L (ref 96–112)
CO2: 26 meq/L (ref 19–32)
CREATININE: 0.62 mg/dL (ref 0.50–1.10)
Calcium: 8.6 mg/dL (ref 8.4–10.5)
GFR calc Af Amer: 87 mL/min — ABNORMAL LOW (ref >90)
GFR calc non Af Amer: 75 mL/min — ABNORMAL LOW (ref >90)
Glucose, Bld: 92 mg/dL (ref 70–99)
Potassium: 4.2 mEq/L (ref 3.7–5.3)
Sodium: 135 mEq/L — ABNORMAL LOW (ref 137–147)

## 2014-12-14 LAB — T4, FREE: Free T4: 1.2 ng/dL (ref 0.80–1.80)

## 2014-12-14 LAB — TSH: TSH: 3.49 u[IU]/mL (ref 0.350–4.500)

## 2014-12-14 MED ORDER — OFF THE BEAT BOOK
Freq: Once | Status: AC
  Administered 2014-12-14: 1
  Filled 2014-12-14: qty 1

## 2014-12-14 MED ORDER — ENOXAPARIN SODIUM 30 MG/0.3ML ~~LOC~~ SOLN
30.0000 mg | SUBCUTANEOUS | Status: DC
  Administered 2014-12-14 – 2014-12-15 (×2): 30 mg via SUBCUTANEOUS
  Filled 2014-12-14 (×3): qty 0.3

## 2014-12-14 NOTE — Progress Notes (Signed)
ANTICOAGULATION CONSULT NOTE - Follow Up Consult  Pharmacy Consult for Lovenox Indication: VTE prophylaxis  Allergies  Allergen Reactions  . Codeine Other (See Comments)    unknown  . Levaquin [Levofloxacin In D5w] Itching  . Xylocaine [Lidocaine Hcl] Other (See Comments)    Uncontrolled shaking    Patient Measurements: Height: 4\' 7"  (139.7 cm) Weight: 92 lb 5.1 oz (41.876 kg) (Scale B) IBW/kg (Calculated) : 34 Heparin Dosing Weight:    Vital Signs: Temp: 97.8 F (36.6 C) (12/21 0507) Temp Source: Oral (12/21 0507) BP: 119/58 mmHg (12/21 0507) Pulse Rate: 78 (12/21 0507)  Labs:  Recent Labs  12/13/14 1405 12/14/14 0427  HGB 13.3 11.9*  HCT 38.9 35.0*  PLT 233 221  CREATININE 0.43* 0.62    Estimated Creatinine Clearance: 25.8 mL/min (by C-G formula based on Cr of 0.62).  Assessment: SOB 80 y/o F admitted with PNA vs afib. CXR with infiltrate: Azithro/Rocephin.  Anticoagulation: Patient to begin LMWH for VTE prophylaxis. With weight <45 kg (at 41kg) and CrCl<30 (25), requires reduced dosing. Baseline Hgb 13.3>>11.9 this AM. Plts 221 ok.  Goal of Therapy:  VTE prophx. Monitor platelets by anticoagulation protocol: Yes   Plan:  Lovenox 30mg  SQ q24h. No further dosage adjustment required. Pharmacy will sign off.   Candela Krul S. Merilynn Finlandobertson, PharmD, [facility] Clinical Staff Pharmacist Pager [phone]  Misty Stanleyobertson, Anjolie Majer Stillinger 12/14/2014,8:48 AM

## 2014-12-14 NOTE — Progress Notes (Addendum)
Patient Name: Melanie Obrien Date of Encounter: 12/14/2014     Principal Problem:   Pneumonia Active Problems:   Atrial fibrillation   TIA (transient ischemic attack)   Trigeminal neuralgia of left side of face   Hot flashes    SUBJECTIVE  The patient feels better, improved SOB, no CP.  CURRENT MEDS . azithromycin  500 mg Intravenous Q24H  . cefTRIAXone (ROCEPHIN)  IV  1 g Intravenous Q24H  . clopidogrel  75 mg Oral QHS  . cycloSPORINE  1 drop Both Eyes QHS  . enoxaparin (LOVENOX) injection  30 mg Subcutaneous Q24H  . estradiol  0.5 mg Oral Daily  . furosemide  40 mg Intravenous BID  . loratadine  10 mg Oral Daily  . metoprolol succinate  50 mg Oral Daily  . pregabalin  75 mg Oral BID  . progesterone  100 mg Oral Daily  . selenium  50 mcg Oral Daily  . sodium chloride  3 mL Intravenous Q12H  . sodium chloride  3 mL Intravenous Q12H    OBJECTIVE  Filed Vitals:   12/13/14 1840 12/13/14 2140 12/14/14 0200 12/14/14 0507  BP: 146/81 117/71 118/69 119/58  Pulse: 127 81 78 78  Temp: 98.3 F (36.8 C) 98 F (36.7 C) 97.9 F (36.6 C) 97.8 F (36.6 C)  TempSrc: Oral Oral Oral Oral  Resp: 22  20 20   Height: 4\' 7"  (1.397 m)     Weight: 94 lb 2.2 oz (42.7 kg)   92 lb 5.1 oz (41.876 kg)  SpO2: 99% 96% 96% 100%    Intake/Output Summary (Last 24 hours) at 12/14/14 1315 Last data filed at 12/14/14 0421  Gross per 24 hour  Intake      0 ml  Output   5025 ml  Net  -5025 ml   Filed Weights   12/13/14 1209 12/13/14 1840 12/14/14 0507  Weight: 100 lb (45.36 kg) 94 lb 2.2 oz (42.7 kg) 92 lb 5.1 oz (41.876 kg)    PHYSICAL EXAM  General: Pleasant, NAD. Neuro: Alert and oriented X 3. Moves all extremities spontaneously. Psych: Normal affect. HEENT:  Normal  Neck: Supple without bruits or JVD. Lungs:  Resp regular and unlabored, CTA. Heart: RRR no s3, s4, or murmurs. Abdomen: Soft, non-tender, non-distended, BS + x 4.  Extremities: No clubbing, cyanosis or edema.  DP/PT/Radials 2+ and equal bilaterally.  Accessory Clinical Findings  CBC  Recent Labs  12/13/14 1405 12/14/14 0427  WBC 9.3 8.2  HGB 13.3 11.9*  HCT 38.9 35.0*  MCV 91.3 91.9  PLT 233 221   Basic Metabolic Panel  Recent Labs  12/13/14 1405 12/14/14 0427  NA 132* 135*  K 4.2 4.2  CL 94* 97  CO2 24 26  GLUCOSE 80 92  BUN 12 15  CREATININE 0.43* 0.62  CALCIUM 8.7 8.6     Recent Labs  12/14/14 0427  TSH 3.490    TELE  Now back in NSR- with some NSVT  Radiology/Studies  Dg Chest 2 View  12/13/2014   CLINICAL DATA:  Cough and difficulty breathing for 2 days  EXAM: CHEST  2 VIEW  COMPARISON:  December 11, 2014  FINDINGS: There is underlying emphysematous change. There is again noted opacity in the lateral left base, probably representing a small area of consolidation with small effusion. Lungs elsewhere clear. Heart is mildly enlarged with pulmonary vascularity within normal limits. No adenopathy. Bones are osteoporotic. There is a focal area of calcification in the left  carotid artery.  IMPRESSION: Opacity lateral left base, upright representing combination of focal infiltrate with small effusion. Lungs elsewhere clear. Underlying emphysema. Stable cardiomegaly.   Electronically Signed   By: Bretta BangWilliam  Woodruff M.D.   On: 12/13/2014 13:22    ASSESSMENT AND PLAN  Pneumonia: cont Abx per IM  Atrial fibrillation w/ RVR:  -- Spontaneously converted into NSR. -- Continue Toprol XL 50mg  qd  NSVT- noted on tele. HR 60s. Cannot really increase BB. Continue to monitor  -- Continue Toprol XL 50mg  qd  Edema: Continue IV Lasix 40mg  BID -- 2D ECHO performed and reading pending  Signed, Janetta HoraHOMPSON, KATHRYN R PA-C  Pager [phone]   The patient was seen, examined and discussed with Carlean JewsKatie Thompson, PA-C and I agree with the above.   80 year old female admitted with CAP, found to be in a-fib with RVR, now cardioverted spontaneously to SR with few episodes of nsVT - 4-7  cycles. There is no much for increasing BB as she is bradycardic at baseline. She is mildly fluid overloaded, I would give another dose of lasix 40 mg tonight and re-evaluate in the am. She has diuresed significantly overnight with -5 L and negative 8 lbs.  CHADS2-VASc score 5, but poor anticoagulation candidate considering her age and fall risk.  Lars MassonELSON, Aniko Finnigan H 12/14/2014

## 2014-12-14 NOTE — Progress Notes (Signed)
UR completed Aarian Cleaver K. Maximiano Lott, RN, BSN, MSHL, CCM  12/14/2014 10:57 AM

## 2014-12-14 NOTE — Care Management Note (Signed)
    Page 1 of 1   12/14/2014     10:51:23 AM CARE MANAGEMENT NOTE 12/14/2014  Patient:  Melanie ChancellorWATERS,Melanie H   Account Number:  [id number]  Date Initiated:  12/14/2014  Documentation initiated by:  Donato SchultzHUTCHINSON,Koray Soter  Subjective/Objective Assessment:   CAP,- left lower lobe  Afib with New onset CHF possibly associated to  AVR     Action/Plan:   CM to follow for disposition needs   Anticipated DC Date:  12/16/2014   Anticipated DC Plan:  HOME/SELF CARE         Choice offered to / List presented to:             Status of service:  In process, will continue to follow Medicare Important Message given?  NA - LOS <3 / Initial given by admissions (If response is "NO", the following Medicare IM given date fields will be blank) Date Medicare IM given:   Medicare IM given by:   Date Additional Medicare IM given:   Additional Medicare IM given by:    Discharge Disposition:    Per UR Regulation:  Reviewed for med. necessity/level of care/duration of stay  If discussed at Long Length of Stay Meetings, dates discussed:    Comments:  Sherly Brodbeck RN, BSN, MSHL, CCM  Nurse - Case Manager,  ([facility] [place])  [phone]  12/14/2014 CAP,- left lower lobe Afib with New onset CHF possibly associated to  AVR IV Rocepin IV Lasix 40 BID Social:  93 you from home.  Patient's DTR is a physician. Hx/o 1 admission over the past 6 months. Dispo Plan:  Pending

## 2014-12-14 NOTE — Evaluation (Signed)
Physical Therapy Evaluation Patient Details Name: Melanie Obrien MRN: [id number] DOB: May 26, 1919 Today's Date: 12/14/2014   History of Present Illness  Patient is a 80 y/o female with PMH of TIA, trigeminal neuralgia, hot flashes and severe scoliosis who presents with an episode of tightness in the center of her chest associated with some shortness of breath and noted to be in A-fib with RVR. Admitted with CAP. Now returned to NSR.    Clinical Impression  Patient presents with functional limitations due to deficits listed in PT problem list (see below). Pt with balance deficits, dyspnea and drop in Sa02 with activity. Education on pursed lip breathing to improve ventilation and Sa02 during exercise. Pt would benefit from skilled PT to improve gait, balance and overall mobility so pt can maximize independence and return to PLOF.    Follow Up Recommendations Supervision/Assistance - 24 hour;No PT follow up    Equipment Recommendations  None recommended by PT    Recommendations for Other Services       Precautions / Restrictions Precautions Precautions: Fall Precaution Comments: drop in Sa02 Restrictions Weight Bearing Restrictions: No      Mobility  Bed Mobility Overal bed mobility: Needs Assistance Bed Mobility: Supine to Sit     Supine to sit: Supervision;HOB elevated     General bed mobility comments: USe of rails for assist. SUpervision for safety.  Transfers Overall transfer level: Needs assistance Equipment used: Rolling walker (2 wheeled) Transfers: Sit to/from Stand Sit to Stand: Min guard         General transfer comment: Min guard for safety. Posterior lean with LEs using bed rail to stabilize. Stood from [employer]OB x1, from chair x1.  Ambulation/Gait Ambulation/Gait assistance: Min guard;Min assist Ambulation Distance (Feet): 150 Feet Assistive device: Rolling walker (2 wheeled) Gait Pattern/deviations: Step-through pattern;Decreased stride length;Trunk  flexed   Gait velocity interpretation: Below normal speed for age/gender General Gait Details: Mildly unsteady gait. No overt LOB. Dyspnea present with Sa02 drop to 85%. VC's for pursed lip breathing, resolving Sa02 to >93%. 1 LOB when reaching backwards to grab something off counter requiring Min A to prevent LOB.  Stairs            Wheelchair Mobility    Modified Rankin (Stroke Patients Only)       Balance Overall balance assessment: Needs assistance Sitting-balance support: Feet supported;No upper extremity supported Sitting balance-Leahy Scale: Good Sitting balance - Comments: Able to reach outside BoS to donn shoes, increased time.    Standing balance support: During functional activity Standing balance-Leahy Scale: Fair Standing balance comment: Furniture reaching within room for support, requires BUE support on RW for longer distances.                              Pertinent Vitals/Pain Pain Assessment: No/denies pain    Home Living Family/patient expects to be discharged to:: Assisted living ([facility])               Home Equipment: Walker - 4 wheels      Prior Function Level of Independence: Independent with assistive device(s);Needs assistance      ADL's / Homemaking Assistance Needed: (I) for ADLs and does some meal prep. Goes to dining hall for 1 meal/day.  Comments: Independent ambulator within home and uses rollator for hallway/community ambulation.     Hand Dominance        Extremity/Trunk Assessment   Upper Extremity Assessment:  Overall [facility] IncWFL for tasks assessed;Defer to OT evaluation           Lower Extremity Assessment: Overall WFL for tasks assessed      Cervical / Trunk Assessment: Kyphotic  Communication   Communication: HOH  Cognition Arousal/Alertness: Awake/alert Behavior During Therapy: WFL for tasks assessed/performed Overall Cognitive Status: Within Functional Limits for tasks  assessed                      General Comments General comments (skin integrity, edema, etc.): Daughter present during PT evaluation. Discussed possibly staying at assisted living portion of [facility] for a few days.    Exercises        Assessment/Plan    PT Assessment Patient needs continued PT services  PT Diagnosis Difficulty walking   PT Problem List Cardiopulmonary status limiting activity;Decreased activity tolerance;Decreased balance;Decreased mobility  PT Treatment Interventions Balance training;Gait training;Patient/family education;Functional mobility training;Therapeutic activities;Therapeutic exercise;Neuromuscular re-education   PT Goals (Current goals can be found in the Care Plan section) Acute Rehab PT Goals Patient Stated Goal: to return home PT Goal Formulation: With patient Time For Goal Achievement: 12/28/14 Potential to Achieve Goals: Good    Frequency Min 3X/week   Barriers to discharge Decreased caregiver support Pt lives alone    Co-evaluation               End of Session Equipment Utilized During Treatment: Gait belt Activity Tolerance: Patient tolerated treatment well;Other (comment) (drop in Sa02) Patient left: in bed;with call bell/phone within reach;with family/visitor present (Instructed daughter to help pt return to bed prior to leaving and to put on bed alarm.) Nurse Communication: Mobility status         Time: [id number] PT Time Calculation (min) (ACUTE ONLY): 35 min   Charges:   PT Evaluation $Initial PT Evaluation Tier I: 1 Procedure PT Treatments $Gait Training: 8-22 mins   PT G CodesAlvie Heidelberg:          Folan, Dorian Renfro A 12/14/2014, 4:37 PM Alvie HeidelbergShauna Folan, PT, DPT [phone]

## 2014-12-14 NOTE — Progress Notes (Signed)
Patient ID: Melanie Obrien  female  ZOX:[id number]    DOB: 04-20-1919    DOA: 12/13/2014  PCP: Gaye AlkenBARNES,ELIZABETH STEWART, MD  Brief history of present illness Patient is a 80 year old female with TIA, trigeminal neuralgia, scoliosis, presented with episode of chest tightness, shortness of breath to the ER. Patient was noted to be in atrial fib relation with RVR. Patient also reported cough with some bloody sputum today before the admission. Otherwise no fevers or chills, nausea or vomiting. Patient has a history of pneumonia once in the past.  Chest x-ray in ED showed opacities in the left lateral base representing focal infiltrate with small effusion, underlying emphysema. Cardiology was consulted in ED.    Assessment/Plan: Principal Problem:  CAP/left lung pneumonia - Patient was seen by PCP last week and was started on Z-Pak for possible pneumonia, but patient had only taken 3 doses. Patient was given 1 dose of IV Levaquin in ED which caused a significant allergic reaction, red streaking along the veins after Levaquin was started with itching. - Continue IV Zithromax and IV Rocephin - HIV negative -Continue symptomatic treatment, out of bed to chair, PT evaluation    Active Problems:   Paroxysmal Atrial fibrillation - Currently back in normal sinus rhythm, continue beta blocker  - Discussed in detail with patient's daughter, Dr. Selena BattenWendy McNeil, patient has history of falls in the past however lately she has [facility] and has not had any falls.  - will defer to cardiology regarding decision about anticoagulation for her, for now continue Plavix     TIA (transient ischemic attack) - No focal neurological deficits, continue Plavix     Trigeminal neuralgia of left side of face -  continue Lyrica   DVT Prophylaxis: Levaquin   Code Status: full code   Family Communication: discussed in detail with Dr. Uvaldo RisingMcNeil, patient's  daughter   Disposition: hopefully tomorrow back to the facility, await PT evaluation   Consultants:  Cardiology  Procedures: Chest x-ray  Antibiotics   IV Rocephin 12/20    IV Zithromax, 12/20  Subjective: Patient seen and examined, currently normal sinus rhythm, stable, no fevers or chills any chest pain or shortness of breath   Objective  Weight change:   Intake/Output Summary (Last 24 hours) at 12/14/14 1045 Last data filed at 12/14/14 0421  Gross per 24 hour  Intake      0 ml  Output   5025 ml  Net  -5025 ml   Blood pressure 119/58, pulse 78, temperature 97.8 F (36.6 C), temperature source Oral, resp. rate 20, height 4\' 7"  (1.397 m), weight 41.876 kg (92 lb 5.1 oz), SpO2 100 %.  Physical Exam: General: Alert and awake, oriented x3, not in any acute distress. CVS: S1-S2 clear, NSR Chest: dec BS at bases  Abdomen: soft nontender, nondistended, normal bowel sounds  Extremities: no cyanosis, clubbing or edema noted bilaterally Neuro: Cranial nerves II-XII intact, no focal neurological deficits  Lab Results: Basic Metabolic Panel:  Recent Labs Lab 12/13/14 1405 12/14/14 0427  NA 132* 135*  K 4.2 4.2  CL 94* 97  CO2 24 26  GLUCOSE 80 92  BUN 12 15  CREATININE 0.43* 0.62  CALCIUM 8.7 8.6   Liver Function Tests: No results for input(s): AST, ALT, ALKPHOS, BILITOT, PROT, ALBUMIN in the last 168 hours. No results for input(s): LIPASE, AMYLASE in the last 168 hours. No results for input(s): AMMONIA in the last  168 hours. CBC:  Recent Labs Lab 12/13/14 1405 12/14/14 0427  WBC 9.3 8.2  HGB 13.3 11.9*  HCT 38.9 35.0*  MCV 91.3 91.9  PLT 233 221   Cardiac Enzymes: No results for input(s): CKTOTAL, CKMB, CKMBINDEX, TROPONINI in the last 168 hours. BNP: Invalid input(s): POCBNP CBG:  Recent Labs Lab 12/13/14 1413 12/13/14 1537  GLUCAP 69* 116*     Micro Results: No results found for this or any previous visit (from the past 240  hour(s)).  Studies/Results: Dg Chest 2 View  12/13/2014   CLINICAL DATA:  Cough and difficulty breathing for 2 days  EXAM: CHEST  2 VIEW  COMPARISON:  December 11, 2014  FINDINGS: There is underlying emphysematous change. There is again noted opacity in the lateral left base, probably representing a small area of consolidation with small effusion. Lungs elsewhere clear. Heart is mildly enlarged with pulmonary vascularity within normal limits. No adenopathy. Bones are osteoporotic. There is a focal area of calcification in the left carotid artery.  IMPRESSION: Opacity lateral left base, upright representing combination of focal infiltrate with small effusion. Lungs elsewhere clear. Underlying emphysema. Stable cardiomegaly.   Electronically Signed   By: Bretta BangWilliam  Woodruff M.D.   On: 12/13/2014 13:22    Medications: Scheduled Meds: . azithromycin  500 mg Intravenous Q24H  . cefTRIAXone (ROCEPHIN)  IV  1 g Intravenous Q24H  . clopidogrel  75 mg Oral QHS  . cycloSPORINE  1 drop Both Eyes QHS  . enoxaparin (LOVENOX) injection  30 mg Subcutaneous Q24H  . estradiol  0.5 mg Oral Daily  . furosemide  40 mg Intravenous BID  . loratadine  10 mg Oral Daily  . metoprolol succinate  50 mg Oral Daily  . pregabalin  75 mg Oral BID  . progesterone  100 mg Oral Daily  . selenium  50 mcg Oral Daily  . sodium chloride  3 mL Intravenous Q12H  . sodium chloride  3 mL Intravenous Q12H      LOS: 1 day   RAI,RIPUDEEP M.D. [facility] 12/14/2014, 10:45 AM Pager: [phone]  If 7PM-7AM, please contact night-coverage [email or web address] Password [email or web address]

## 2014-12-14 NOTE — Progress Notes (Signed)
  Echocardiogram 2D Echocardiogram has been performed.  Tyeshia Cornforth 12/14/2014, 12:27 PM

## 2014-12-15 DIAGNOSIS — I5033 Acute on chronic diastolic (congestive) heart failure: Secondary | ICD-10-CM

## 2014-12-15 LAB — CBC
HCT: 33 % — ABNORMAL LOW (ref 36.0–46.0)
Hemoglobin: 11.2 g/dL — ABNORMAL LOW (ref 12.0–15.0)
MCH: 31.9 pg (ref 26.0–34.0)
MCHC: 33.9 g/dL (ref 30.0–36.0)
MCV: 94 fL (ref 78.0–100.0)
PLATELETS: 200 10*3/uL (ref 150–400)
RBC: 3.51 MIL/uL — AB (ref 3.87–5.11)
RDW: 14.8 % (ref 11.5–15.5)
WBC: 7.3 10*3/uL (ref 4.0–10.5)

## 2014-12-15 LAB — BASIC METABOLIC PANEL
ANION GAP: 10 (ref 5–15)
BUN: 20 mg/dL (ref 6–23)
CALCIUM: 8 mg/dL — AB (ref 8.4–10.5)
CO2: 27 mmol/L (ref 19–32)
CREATININE: 0.62 mg/dL (ref 0.50–1.10)
Chloride: 95 mEq/L — ABNORMAL LOW (ref 96–112)
GFR calc Af Amer: 87 mL/min — ABNORMAL LOW (ref >90)
GFR calc non Af Amer: 75 mL/min — ABNORMAL LOW (ref >90)
GLUCOSE: 83 mg/dL (ref 70–99)
Potassium: 3.8 mmol/L (ref 3.5–5.1)
Sodium: 132 mmol/L — ABNORMAL LOW (ref 135–145)

## 2014-12-15 MED ORDER — FUROSEMIDE 20 MG PO TABS
20.0000 mg | ORAL_TABLET | Freq: Every day | ORAL | Status: DC
  Administered 2014-12-16: 20 mg via ORAL
  Filled 2014-12-15: qty 1

## 2014-12-15 MED ORDER — METOPROLOL SUCCINATE ER 25 MG PO TB24
75.0000 mg | ORAL_TABLET | Freq: Every day | ORAL | Status: DC
Start: 2014-12-16 — End: 2014-12-16
  Administered 2014-12-16: 75 mg via ORAL
  Filled 2014-12-15: qty 1

## 2014-12-15 MED ORDER — METOPROLOL SUCCINATE ER 25 MG PO TB24
25.0000 mg | ORAL_TABLET | Freq: Once | ORAL | Status: AC
  Administered 2014-12-15: 25 mg via ORAL
  Filled 2014-12-15: qty 1

## 2014-12-15 NOTE — Progress Notes (Signed)
Physical Therapy Treatment Patient Details Name: Melanie Obrien MRN: [id number] DOB: 13-Nov-1919 Today's Date: 12/15/2014    History of Present Illness Patient is a 80 y/o female with PMH of TIA, trigeminal neuralgia, hot flashes and severe scoliosis who presents with an episode of tightness in the center of her chest associated with some shortness of breath and noted to be in A-fib with RVR. Admitted with CAP. Now returned to NSR.    PT Comments    Patient progressing well with mobility, ambulating with and without device, no physical assist required. Patient perform various self care tasks in room with supervision. VSS, SpO2 >90% throughout session on room air. Will continue to see and progress as tolerated.   Follow Up Recommendations  Supervision/Assistance - 24 hour;No PT follow up     Equipment Recommendations  None recommended by PT    Recommendations for Other Services       Precautions / Restrictions Precautions Precautions: Fall Precaution Comments: drop in SpO2 Restrictions Weight Bearing Restrictions: No    Mobility  Bed Mobility Overal bed mobility: Needs Assistance Bed Mobility: Supine to Sit     Supine to sit: Supervision;HOB elevated Sit to supine: Supervision   General bed mobility comments: patient with use of bedrail during transition to sitting when coming to sitting.   Transfers Overall transfer level: Needs assistance Equipment used: Rolling walker (2 wheeled) Transfers: Sit to/from Stand Sit to Stand: Supervision         General transfer comment: performed various times from different surfaces.   Ambulation/Gait Ambulation/Gait assistance: Supervision Ambulation Distance (Feet): 310 Feet Assistive device: Rolling walker (2 wheeled) Gait Pattern/deviations: Step-through pattern;Decreased stride length;Trunk flexed Gait velocity: decreased  Gait velocity interpretation: Below normal speed for age/gender General Gait Details: improved  stabilty with ambulation over increased distance   Stairs            Wheelchair Mobility    Modified Rankin (Stroke Patients Only)       Balance Overall balance assessment: Needs assistance Sitting-balance support: Feet supported Sitting balance-Leahy Scale: Good     Standing balance support: During functional activity Standing balance-Leahy Scale: Fair Standing balance comment: at times, patient continues to reach out for countertops and window sill for support                    Cognition Arousal/Alertness: Awake/alert Behavior During Therapy: WFL for tasks assessed/performed Overall Cognitive Status: Within Functional Limits for tasks assessed                      Exercises  Encouraged BLE ankle and knee ROM prior to activity    General Comments General comments (skin integrity, edema, etc.): patient able to perform some self care tasks at the sink as well as use of BSC and in room activity without assistive device.  patient tolerated several moments of static standing for various activities prior to and post ambulation. SpO2 remained greater than 90% with activity at all times. Patient able to perform dynmaci sitting balance activities without assist.      Pertinent Vitals/Pain Pain Assessment: No/denies pain    Home Living                      Prior Function            PT Goals (current goals can now be found in the care plan section) Acute Rehab PT Goals Patient Stated Goal: to return home PT  Goal Formulation: With patient Time For Goal Achievement: 12/28/14 Potential to Achieve Goals: Good Progress towards PT goals: Progressing toward goals    Frequency  Min 3X/week    PT Plan Current plan remains appropriate    Co-evaluation             End of Session Equipment Utilized During Treatment: Gait belt Activity Tolerance: Patient tolerated treatment well;Other (comment) Patient left: in bed;with call bell/phone  within reach;with family/visitor present     Time: 0940-1005 PT Time Calculation (min) (ACUTE ONLY): 25 min  Charges:  $Gait Training: 8-22 mins $Therapeutic Activity: 8-22 mins                    G CodesFabio Asa:      Balin Vandegrift J 12/15/2014, 3:25 PM Charlotte Crumbevon Lynsi Dooner, PT DPT  [phone]

## 2014-12-15 NOTE — Progress Notes (Signed)
Patient ID: Melanie Obrien  female  AOZ:[id number]    DOB: Jul 31, 1919    DOA: 12/13/2014  PCP: Gaye Alken, MD  Brief history of present illness Patient is a 80 year old female with TIA, trigeminal neuralgia, scoliosis, presented with episode of chest tightness, shortness of breath to the ER. Patient was noted to be in atrial fib relation with RVR. Patient also reported cough with some bloody sputum today before the admission. Otherwise no fevers or chills, nausea or vomiting. Patient has a history of pneumonia once in the past.  Chest x-ray in ED showed opacities in the left lateral base representing focal infiltrate with small effusion, underlying emphysema. Cardiology was consulted in ED.    Assessment/Plan: Principal Problem:  CAP/left lung pneumonia - Patient was seen by PCP last week and was started on Z-Pak for possible pneumonia, but patient had only taken 3 doses. Patient was given 1 dose of IV Levaquin in ED which caused a significant allergic reaction, red streaking along the veins after Levaquin was started with itching. - Continue IV Zithromax and IV Rocephin - HIV negative - Patient ambulating with PT without any difficulty  Active Problems:   Paroxysmal Atrial fibrillation - Heart rate still not well controlled, while eating breakfast heart rate was 100-110  - CHADS2 -VASc 5 however poor anticoagulation candidate considering her age and fall risk by cardiology - Cards to increase beta blocker today, to 75 mg BID  Mild acute diastolic CHF secondary to A. fib with RVR -2-D echo showed EF of 55-60%, no regional wall motion abnormalities, moderate pulmonary hypertension -  Patient was placed on IV diuresis yesterday, negative balance of 6.3 liters today. Cardiology planning to transition to oral Lasix.     TIA (transient ischemic attack) - No focal neurological deficits, continue Plavix     Trigeminal neuralgia of left side of face -  continue Lyrica   DVT  Prophylaxis:  Lovenox  Code Status: full code   Family Communication: discussed in detail with Dr. Uvaldo Rising, patient's daughter   Disposition:   Consultants:  Cardiology  Procedures: Chest x-ray  Antibiotics   IV Rocephin 12/20    IV Zithromax, 12/20  Subjective: Patient seen and examined, heart rate not well controlled in the low 100s while in bed, denies any fever or chills or shortness of breath and ambulating without any difficulty  Objective  Weight change: -3.85 kg (-8 lb 7.8 oz)  Intake/Output Summary (Last 24 hours) at 12/15/14 1430 Last data filed at 12/15/14 1337  Gross per 24 hour  Intake    783 ml  Output   1900 ml  Net  -1117 ml   Blood pressure 132/96, pulse 107, temperature 97.8 F (36.6 C), temperature source Oral, resp. rate 16, height 4\' 7"  (1.397 m), weight 41.51 kg (91 lb 8.2 oz), SpO2 95 %.  Physical Exam: General: Alert and awake, oriented x3, not in any acute distress. CVS: S1-S2 clear, ireg Chest: dec BS at bases  Abdomen: soft nontender, nondistended, normal bowel sounds  Extremities: no cyanosis, clubbing or edema noted bilaterally Neuro: Cranial nerves II-XII intact, no focal neurological deficits  Lab Results: Basic Metabolic Panel:  Recent Labs Lab 12/14/14 0427 12/15/14 0400  NA 135* 132*  K 4.2 3.8  CL 97 95*  CO2 26 27  GLUCOSE 92 83  BUN 15 20  CREATININE 0.62 0.62  CALCIUM 8.6 8.0*   Liver Function Tests: No results for input(s): AST, ALT, ALKPHOS, BILITOT, PROT, ALBUMIN in the last  168 hours. No results for input(s): LIPASE, AMYLASE in the last 168 hours. No results for input(s): AMMONIA in the last 168 hours. CBC:  Recent Labs Lab 12/14/14 0427 12/15/14 0400  WBC 8.2 7.3  HGB 11.9* 11.2*  HCT 35.0* 33.0*  MCV 91.9 94.0  PLT 221 200   Cardiac Enzymes: No results for input(s): CKTOTAL, CKMB, CKMBINDEX, TROPONINI in the last 168 hours. BNP: Invalid input(s): POCBNP CBG:  Recent Labs Lab 12/13/14 1413  12/13/14 1537  GLUCAP 69* 116*     Micro Results: Recent Results (from the past 240 hour(s))  Culture, blood (routine x 2) Call MD if unable to obtain prior to antibiotics being given     Status: None (Preliminary result)   Collection Time: 12/13/14  6:15 PM  Result Value Ref Range Status   Specimen Description BLOOD RIGHT ARM  Final   Special Requests BOTTLES DRAWN AEROBIC AND ANAEROBIC 5ML  Final   Culture  Setup Time   Final    12/14/2014 02:30 Performed at [facility]    Culture   Final           BLOOD CULTURE RECEIVED NO GROWTH TO DATE CULTURE WILL BE HELD FOR 5 DAYS BEFORE ISSUING A FINAL NEGATIVE REPORT Performed at [facility]    Report Status PENDING  Incomplete  Culture, blood (routine x 2) Call MD if unable to obtain prior to antibiotics being given     Status: None (Preliminary result)   Collection Time: 12/13/14  6:30 PM  Result Value Ref Range Status   Specimen Description BLOOD RIGHT ARM  Final   Special Requests BOTTLES DRAWN AEROBIC ONLY 5CC  Final   Culture  Setup Time   Final    12/14/2014 02:30 Performed at [facility]    Culture   Final           BLOOD CULTURE RECEIVED NO GROWTH TO DATE CULTURE WILL BE HELD FOR 5 DAYS BEFORE ISSUING A FINAL NEGATIVE REPORT Performed at [facility]    Report Status PENDING  Incomplete    Studies/Results: Dg Chest 2 View  12/13/2014   CLINICAL DATA:  Cough and difficulty breathing for 2 days  EXAM: CHEST  2 VIEW  COMPARISON:  December 11, 2014  FINDINGS: There is underlying emphysematous change. There is again noted opacity in the lateral left base, probably representing a small area of consolidation with small effusion. Lungs elsewhere clear. Heart is mildly enlarged with pulmonary vascularity within normal limits. No adenopathy. Bones are osteoporotic. There is a focal area of calcification in the left carotid artery.  IMPRESSION: Opacity lateral left base, upright representing  combination of focal infiltrate with small effusion. Lungs elsewhere clear. Underlying emphysema. Stable cardiomegaly.   Electronically Signed   By: Bretta BangWilliam  Woodruff M.D.   On: 12/13/2014 13:22    Medications: Scheduled Meds: . azithromycin  500 mg Intravenous Q24H  . cefTRIAXone (ROCEPHIN)  IV  1 g Intravenous Q24H  . clopidogrel  75 mg Oral QHS  . cycloSPORINE  1 drop Both Eyes QHS  . enoxaparin (LOVENOX) injection  30 mg Subcutaneous Q24H  . estradiol  0.5 mg Oral Daily  . furosemide  40 mg Intravenous BID  . loratadine  10 mg Oral Daily  . metoprolol succinate  50 mg Oral Daily  . pregabalin  75 mg Oral BID  . progesterone  100 mg Oral Daily  . selenium  50 mcg Oral Daily  . sodium chloride  3 mL Intravenous Q12H  . sodium chloride  3 mL Intravenous Q12H      LOS: 2 days   Dariya Gainer M.D. [facility] 12/15/2014, 2:30 PM Pager: [phone]  If 7PM-7AM, please contact night-coverage [email or web address] Password [email or web address]

## 2014-12-15 NOTE — Plan of Care (Signed)
Problem: Phase I Progression Outcomes Goal: EF % per last Echo/documented,Core Reminder form on chart Outcome: Completed/Met Date Met:  12/15/14 Echo performed on 12/14/2014  EF% result - 55-60%

## 2014-12-15 NOTE — Progress Notes (Signed)
Patient Profile: 80 y/o female with h/o PAF now with Afib w/ RVR in the setting of PNA.   Subjective: No complaints. She feels that she is back to her baseline. She ambulated today back and forth to nurses station w/o dyspnea. She denies CP and palpitations.   Objective: Vital signs in last 24 hours: Temp:  [97.8 F (36.6 C)-98.4 F (36.9 C)] 97.8 F (36.6 C) (12/22 0945) Pulse Rate:  [61-107] 107 (12/22 0945) Resp:  [16-30] 16 (12/22 0945) BP: (109-132)/(44-96) 132/96 mmHg (12/22 0945) SpO2:  [95 %-100 %] 95 % (12/22 0945) Weight:  [91 lb 8.2 oz (41.51 kg)] 91 lb 8.2 oz (41.51 kg) (12/22 0500) Last BM Date: 12/14/14  Intake/Output from previous day: 12/21 0701 - 12/22 0700 In: 683 [P.O.:680; I.V.:3] Out: 1100 [Urine:1100] Intake/Output this shift: Total I/O In: 240 [P.O.:240] Out: 1400 [Urine:1400]  Medications . azithromycin  500 mg Intravenous Q24H  . cefTRIAXone (ROCEPHIN)  IV  1 g Intravenous Q24H  . clopidogrel  75 mg Oral QHS  . cycloSPORINE  1 drop Both Eyes QHS  . enoxaparin (LOVENOX) injection  30 mg Subcutaneous Q24H  . estradiol  0.5 mg Oral Daily  . furosemide  40 mg Intravenous BID  . loratadine  10 mg Oral Daily  . metoprolol succinate  50 mg Oral Daily  . pregabalin  75 mg Oral BID  . progesterone  100 mg Oral Daily  . selenium  50 mcg Oral Daily  . sodium chloride  3 mL Intravenous Q12H  . sodium chloride  3 mL Intravenous Q12H   PE: General appearance: alert, cooperative and no distress Neck: no carotid bruit and no JVD Lungs: faint rales in LLL, otherwise clear Heart: irregularly irregular rhythm Extremities: no LEE Pulses: 2+ and symmetric Skin: warm and dry Neurologic: Grossly normal  Lab Results:   Recent Labs  12/13/14 1405 12/14/14 0427 12/15/14 0400  WBC 9.3 8.2 7.3  HGB 13.3 11.9* 11.2*  HCT 38.9 35.0* 33.0*  PLT 233 221 200   BMET  Recent Labs  12/13/14 1405 12/14/14 0427 12/15/14 0400  NA 132* 135* 132*  K 4.2  4.2 3.8  CL 94* 97 95*  CO2 24 26 27   GLUCOSE 80 92 83  BUN 12 15 20   CREATININE 0.43* 0.62 0.62  CALCIUM 8.7 8.6 8.0*   2D echo 12/14/13 Study Conclusions  - Left ventricle: The cavity size was normal. Wall thickness was normal. Systolic function was normal. The estimated ejection fraction was in the range of 55% to 60%. Wall motion was normal; there were no regional wall motion abnormalities. Doppler parameters are consistent with high ventricular filling pressure. - Aortic valve: There was mild regurgitation. - Mitral valve: Severely calcified annulus. Mildly thickened leaflets . There was mild regurgitation. - Left atrium: The atrium was severely dilated. - Pulmonary arteries: Systolic pressure was moderately increased. PA peak pressure: 46 mm Hg (S). - Pericardium, extracardiac: A trivial pericardial effusion was identified. There was a left pleural effusion.  Impressions:  - Normal LV function; elevated left ventricular filling pressures; severe LAE; mild AI and MR; moderately elevated pulmonary pressure.   Assessment/Plan    Principal Problem:   Pneumonia Active Problems:   Atrial fibrillation   TIA (transient ischemic attack)   Trigeminal neuralgia of left side of face   Hot flashes   SOB (shortness of breath)  1. PNA: management per IM.   2. PAF: Patient was in NSR but reverted back into Afib earlier  today. Resting rate is in the upper 90s-110s. She asymptomatic. There is room in BP to further titrate metoprolol. May also consider adding amiodarone. Will discuss with MD. CHADS2-VASc score 5, but poor anticoagulation candidate considering her age and fall risk.  3. Volume-overload: EF normal at 55-60%. No DOE or resting dyspnea. Faint LLQ rales but no peripheral edema. Can switch back to PO lasix today. Renal function and electrolytes are both stable.    LOS: 2 days   Brittainy M. Delmer IslamSimmons, PA-C 12/15/2014 1:18 PM   The patient was  seen, examined and discussed with Brittainy M. Sharol HarnessSimmons, PA-C and I agree with the above.   The patient is back in atrial fibrillation with RVR and ventricular rate 100-110/minute starting at 8:30 am. I would increase metoprolol to 75 mg po BID and decrease lasix to 20 mg po daily. She is euvolemic. We will arrange for a follow up with Dr Eldridge DaceVaranasi. She will continue antibiotics for pneumonia.    Lars MassonELSON, Avyn Coate H 12/15/2014

## 2014-12-16 DIAGNOSIS — I4891 Unspecified atrial fibrillation: Secondary | ICD-10-CM | POA: Insufficient documentation

## 2014-12-16 DIAGNOSIS — I10 Essential (primary) hypertension: Secondary | ICD-10-CM

## 2014-12-16 LAB — BASIC METABOLIC PANEL
Anion gap: 7 (ref 5–15)
BUN: 18 mg/dL (ref 6–23)
CO2: 29 mmol/L (ref 19–32)
CREATININE: 0.65 mg/dL (ref 0.50–1.10)
Calcium: 8 mg/dL — ABNORMAL LOW (ref 8.4–10.5)
Chloride: 99 mEq/L (ref 96–112)
GFR, EST AFRICAN AMERICAN: 86 mL/min — AB (ref >90)
GFR, EST NON AFRICAN AMERICAN: 74 mL/min — AB (ref >90)
Glucose, Bld: 90 mg/dL (ref 70–99)
Potassium: 3.9 mmol/L (ref 3.5–5.1)
Sodium: 135 mmol/L (ref 135–145)

## 2014-12-16 LAB — CBC
HCT: 36.4 % (ref 36.0–46.0)
Hemoglobin: 12.2 g/dL (ref 12.0–15.0)
MCH: 30.7 pg (ref 26.0–34.0)
MCHC: 33.5 g/dL (ref 30.0–36.0)
MCV: 91.5 fL (ref 78.0–100.0)
Platelets: 214 10*3/uL (ref 150–400)
RBC: 3.98 MIL/uL (ref 3.87–5.11)
RDW: 14.4 % (ref 11.5–15.5)
WBC: 7.8 10*3/uL (ref 4.0–10.5)

## 2014-12-16 LAB — STREP PNEUMONIAE URINARY ANTIGEN: Strep Pneumo Urinary Antigen: NEGATIVE

## 2014-12-16 MED ORDER — AZITHROMYCIN 500 MG PO TABS: 500.0000 mg | ORAL_TABLET | Freq: Every day | ORAL | Status: DC

## 2014-12-16 MED ORDER — METOPROLOL SUCCINATE ER 25 MG PO TB24: 75.0000 mg | ORAL_TABLET | Freq: Every day | ORAL | Status: DC

## 2014-12-16 MED ORDER — FUROSEMIDE 20 MG PO TABS: 20.0000 mg | ORAL_TABLET | Freq: Every day | ORAL | Status: DC

## 2014-12-16 MED ORDER — AZITHROMYCIN 500 MG PO TABS
500.0000 mg | ORAL_TABLET | Freq: Every day | ORAL | Status: DC
  Administered 2014-12-16: 500 mg via ORAL
  Filled 2014-12-16: qty 1

## 2014-12-16 NOTE — Discharge Instructions (Signed)
Follow with Primary MD Melanie Obrien,Melanie STEWART, MD in 7 days   Get CBC, CMP, 2 view Chest X ray checked  by Primary MD next visit.    Activity: As tolerated with Full fall precautions use walker/cane & assistance as needed   Disposition ALF - [facility]   Diet: Heart Healthy  Check your Weight same time everyday, if you gain over 2 pounds, or you develop in leg swelling, experience more shortness of breath or chest pain, call your Primary MD immediately. Follow Cardiac Low Salt Diet and 1.5 lit/day fluid restriction.   On your next visit with your primary care physician please Get Medicines reviewed and adjusted.   Please request your Prim.MD to go over all Hospital Tests and Procedure/Radiological results at the follow up, please get all Hospital records sent to your Prim MD by signing hospital release before you go home.   If you experience worsening of your admission symptoms, develop shortness of breath, life threatening emergency, suicidal or homicidal thoughts you must seek medical attention immediately by calling 911 or calling your MD immediately  if symptoms less severe.  You Must read complete instructions/literature along with all the possible adverse reactions/side effects for all the Medicines you take and that have been prescribed to you. Take any new Medicines after you have completely understood and accpet all the possible adverse reactions/side effects.   Do not drive, operating heavy machinery, perform activities at heights, swimming or participation in water activities or provide baby sitting services if your were admitted for syncope or siezures until you have seen by Primary MD or a Neurologist and advised to do so again.  Do not drive when taking Pain medications.    Do not take more than prescribed Pain, Sleep and Anxiety Medications  Special Instructions: If you have smoked or chewed Tobacco  in the last 2 yrs please stop smoking, stop any regular  Alcohol  and or any Recreational drug use.  Wear Seat belts while driving.   Please note  You were cared for by a hospitalist during your hospital stay. If you have any questions about your discharge medications or the care you received while you were in the hospital after you are discharged, you can call the unit and asked to speak with the hospitalist on call if the hospitalist that took care of you is not available. Once you are discharged, your primary care physician will handle any further medical issues. Please note that NO REFILLS for any discharge medications will be authorized once you are discharged, as it is imperative that you return to your primary care physician (or establish a relationship with a primary care physician if you do not have one) for your aftercare needs so that they can reassess your need for medications and monitor your lab values.

## 2014-12-16 NOTE — Progress Notes (Signed)
Patient Profile: 80 y/o female with h/o PAF now with Afib w/ RVR in the setting of PNA.   Subjective: No complaints. She feels that she is back to her baseline. She denies CP and palpitations.   Objective: Vital signs in last 24 hours: Temp:  [97.8 F (36.6 C)-98.4 F (36.9 C)] 97.8 F (36.6 C) (12/23 0619) Pulse Rate:  [87-111] 93 (12/23 0619) Resp:  [16-18] 18 (12/23 0619) BP: (117-132)/(62-96) 117/77 mmHg (12/23 0619) SpO2:  [95 %-97 %] 95 % (12/23 0619) Weight:  [89 lb 8 oz (40.597 kg)] 89 lb 8 oz (40.597 kg) (12/23 0619) Last BM Date: 12/16/14  Intake/Output from previous day: 12/22 0701 - 12/23 0700 In: 1000 [P.O.:700; IV Piggyback:300] Out: 2300 [Urine:2300] Intake/Output this shift: Total I/O In: 360 [P.O.:360] Out: 200 [Urine:200]  Medications . azithromycin  500 mg Oral Daily  . cefTRIAXone (ROCEPHIN)  IV  1 g Intravenous Q24H  . clopidogrel  75 mg Oral QHS  . cycloSPORINE  1 drop Both Eyes QHS  . enoxaparin (LOVENOX) injection  30 mg Subcutaneous Q24H  . estradiol  0.5 mg Oral Daily  . furosemide  20 mg Oral Daily  . loratadine  10 mg Oral Daily  . metoprolol succinate  75 mg Oral Daily  . pregabalin  75 mg Oral BID  . progesterone  100 mg Oral Daily  . selenium  50 mcg Oral Daily  . sodium chloride  3 mL Intravenous Q12H  . sodium chloride  3 mL Intravenous Q12H   PE: General appearance: alert, cooperative and no distress Neck: no carotid bruit and no JVD Lungs: faint rales in LLL, otherwise clear Heart: irregularly irregular rhythm Extremities: no LEE Pulses: 2+ and symmetric Skin: warm and dry Neurologic: Grossly normal  Lab Results:   Recent Labs  12/14/14 0427 12/15/14 0400 12/16/14 0421  WBC 8.2 7.3 7.8  HGB 11.9* 11.2* 12.2  HCT 35.0* 33.0* 36.4  PLT 221 200 214   BMET  Recent Labs  12/14/14 0427 12/15/14 0400 12/16/14 0421  NA 135* 132* 135  K 4.2 3.8 3.9  CL 97 95* 99  CO2 26 27 29   GLUCOSE 92 83 90  BUN 15 20 18     CREATININE 0.62 0.62 0.65  CALCIUM 8.6 8.0* 8.0*   2D echo 12/14/13 Study Conclusions  - Left ventricle: The cavity size was normal. Wall thickness was normal. Systolic function was normal. The estimated ejection fraction was in the range of 55% to 60%. Wall motion was normal; there were no regional wall motion abnormalities. Doppler parameters are consistent with high ventricular filling pressure. - Aortic valve: There was mild regurgitation. - Mitral valve: Severely calcified annulus. Mildly thickened leaflets . There was mild regurgitation. - Left atrium: The atrium was severely dilated. - Pulmonary arteries: Systolic pressure was moderately increased. PA peak pressure: 46 mm Hg (S). - Pericardium, extracardiac: A trivial pericardial effusion was identified. There was a left pleural effusion.  Impressions:  - Normal LV function; elevated left ventricular filling pressures; severe LAE; mild AI and MR; moderately elevated pulmonary pressure.   Assessment/Plan    Principal Problem:   Pneumonia Active Problems:   Atrial fibrillation   TIA (transient ischemic attack)   Trigeminal neuralgia of left side of face   Hot flashes   SOB (shortness of breath)  1. PNA: management per IM. Continue ATB at home.  2. PAF: Patient was in NSR but reverted back into Afib yesterday, her metoprolol was increased to 75  mg PO BID. Marland Kitchen. Resting rate is in the upper 90s-105s, 90' this am. She asymptomatic. CHADS2-VASc score 5, but poor anticoagulation candidate considering her age and fall risk.  3. Volume-overload: EF normal at 55-60%. No DOE or resting dyspnea. Faint LLQ rales but no peripheral edema. Can switch back to PO 20 mg lasix. Renal function and electrolytes are both stable.   D/C home today and we will arrange a follow up in our clinic in 7-10 days.    Lars MassonELSON, Niley Helbig H 12/16/2014

## 2014-12-16 NOTE — Discharge Summary (Signed)
Melanie Obrien, is a 80 y.o. female  DOB [birth year]  MRN [id number].  Admission date:  12/13/2014  Admitting Physician  Calvert Cantor, MD  Discharge Date:  12/16/2014   Primary MD  Gaye Alken, MD  Recommendations for primary care physician for things to follow:    Monitor BMP closely, adjust diuretic dose and check a two-view chest x-ray in a week.   Admission Diagnosis  SOB (shortness of breath) [R06.02] CAP (community acquired pneumonia) [J18.9] Atrial fibrillation with RVR [I48.91] Acute heart failure, unspecified heart failure type [I50.9]   Discharge Diagnosis  SOB (shortness of breath) [R06.02] CAP (community acquired pneumonia) [J18.9] Atrial fibrillation with RVR [I48.91] Acute heart failure, unspecified heart failure type [I50.9]     Principal Problem:   Pneumonia Active Problems:   Atrial fibrillation   TIA (transient ischemic attack)   Trigeminal neuralgia of left side of face   Hot flashes   SOB (shortness of breath)   Atrial fibrillation with RVR      Past Medical History  Diagnosis Date  . Sinus tachycardia   . Pneumonia   . Scoliosis   . TIA (transient ischemic attack)   . Trigeminal neuralgia of left side of face 12/13/2014  . Hot flashes 12/13/2014    Past Surgical History  Procedure Laterality Date  . Tonsillectomy         History of present illness and  Hospital Course:     Kindly see H&P for history of present illness and admission details, please review complete Labs, Consult reports and Test reports for all details in brief  HPI  from the history and physical done on the day of admission  Melanie Obrien is a 80 y.o. female with past medical history of TIA, trigeminal neuralgia, hot flashes and severe scoliosis who presents with an episode of tightness in the center of  her chest associated with some shortness of breath. The patient's daughter who is an M.D. is at the bedside and assisting with history.   The patient came to the ER because of the chest tightness and was noted to be in A. fib with RVR. Rate has slowed down spontaneously and is now in the 80s. She also gives a history of a cough which has been going on for a few days now. According to the daughter who is at the bedside, she coughed up some bloody sputum yesterday but otherwise has had a nonproductive cough. The patient does not have any other type of chest pain and has not had any fevers chills or sweats or other shortness of breath. She has had pneumonia once in the past with some loculations secondary to mucous plugging. Is not have a history of COPD or asthma but most likely has restrictive lung disease secondary to scoliosis.    Hospital Course    CAP/left lung pneumonia - improved with azithromycin and Rocephin regimen, no shortness of breath or oxygen requirement, and relating without any distress, will give her 4 more days of oral azithromycin and  discharged. Request PCP to repeat 2 view chest x-ray in a week.     Paroxysmal Atrial fibrillation - Heart rate under 100 - CHADS2 -VASc 5 however poor anticoagulation candidate considering her age and fall risk by cardiology - Discussed with cardiologist Dr. Delton See, she has increased her beta blocker  to 75 mg BID, follow outpatient with Dr. Delton See within a week. He had for discharge by cardiology.    Mild acute diastolic CHF secondary to A. fib with RVR -2-D echo showed EF of 55-60%, no regional wall motion abnormalities, moderate pulmonary hypertension - Patient was placed on IV diuresis yesterday, negative balance of 6.3 liters today. Cardiology wants discharged on 20 mg daily Lasix, request PCP to monitor weight and BMP closely. Still has trace edema.    TIA (transient ischemic attack) - No focal neurological deficits, continue  Plavix      Trigeminal neuralgia of left side of face - continue Lyrica    Discharge Condition: Stable, discharged to ALF   Follow UP  Follow-up Information    Follow up with Abelino Derrick, PA-C On 12/30/2014.   Specialty:  Cardiology   Why:  @11 :30am   Contact information:   [address] [place] [address] [phone]       Follow up with Gaye Alken, MD. Schedule an appointment as soon as possible for a visit in 1 week.   Specialty:  Family Medicine   Contact information:   [address] [place] [address]         Discharge Instructions  and  Discharge Medications      Discharge Instructions    Discharge instructions    Complete by:  As directed   Follow with Primary MD Gaye Alken, MD in 7 days   Get CBC, CMP, 2 view Chest X ray checked  by Primary MD next visit.    Activity: As tolerated with Full fall precautions use walker/cane & assistance as needed   Disposition ALF - [facility]   Diet: Heart Healthy  Check your Weight same time everyday, if you gain over 2 pounds, or you develop in leg swelling, experience more shortness of breath or chest pain, call your Primary MD immediately. Follow Cardiac Low Salt Diet and 1.5 lit/day fluid restriction.   On your next visit with your primary care physician please Get Medicines reviewed and adjusted.   Please request your Prim.MD to go over all Hospital Tests and Procedure/Radiological results at the follow up, please get all Hospital records sent to your Prim MD by signing hospital release before you go home.   If you experience worsening of your admission symptoms, develop shortness of breath, life threatening emergency, suicidal or homicidal thoughts you must seek medical attention immediately by calling 911 or calling your MD immediately  if symptoms less severe.  You Must read complete instructions/literature along with all the possible adverse  reactions/side effects for all the Medicines you take and that have been prescribed to you. Take any new Medicines after you have completely understood and accpet all the possible adverse reactions/side effects.   Do not drive, operating heavy machinery, perform activities at heights, swimming or participation in water activities or provide baby sitting services if your were admitted for syncope or siezures until you have seen by Primary MD or a Neurologist and advised to do so again.  Do not drive when taking Pain medications.    Do not take more than prescribed Pain, Sleep and  Anxiety Medications  Special Instructions: If you have smoked or chewed Tobacco  in the last 2 yrs please stop smoking, stop any regular Alcohol  and or any Recreational drug use.  Wear Seat belts while driving.   Please note  You were cared for by a hospitalist during your hospital stay. If you have any questions about your discharge medications or the care you received while you were in the hospital after you are discharged, you can call the unit and asked to speak with the hospitalist on call if the hospitalist that took care of you is not available. Once you are discharged, your primary care physician will handle any further medical issues. Please note that NO REFILLS for any discharge medications will be authorized once you are discharged, as it is imperative that you return to your primary care physician (or establish a relationship with a primary care physician if you do not have one) for your aftercare needs so that they can reassess your need for medications and monitor your lab values.     Increase activity slowly    Complete by:  As directed             Medication List    TAKE these medications        azithromycin 500 MG tablet  Commonly known as:  ZITHROMAX  Take 1 tablet (500 mg total) by mouth daily.     b complex vitamins tablet  Take 1 tablet by mouth daily.     BETA CAROTENE PO  Take 1  tablet by mouth daily.     CALCIUM PO  Take 1 tablet by mouth daily.     CHROMIUM PO  Take 1 tablet by mouth daily.     clopidogrel 75 MG tablet  Commonly known as:  PLAVIX  Take 75 mg by mouth daily.     cycloSPORINE 0.05 % ophthalmic emulsion  Commonly known as:  RESTASIS  Place 1 drop into both eyes daily.     estradiol 1 MG tablet  Commonly known as:  ESTRACE  Take 0.5 mg by mouth daily.     fexofenadine 180 MG tablet  Commonly known as:  ALLEGRA  Take 180 mg by mouth daily.     furosemide 20 MG tablet  Commonly known as:  LASIX  Take 1 tablet (20 mg total) by mouth daily.     MAGNESIUM PO  Take 1 tablet by mouth daily.     metoprolol succinate 25 MG 24 hr tablet  Commonly known as:  TOPROL-XL  Take 3 tablets (75 mg total) by mouth daily.     MULTIVITAMIN PO  Take 1 tablet by mouth daily.     PANTOTHENIC ACID PO  Take 1 tablet by mouth daily.     pregabalin 75 MG capsule  Commonly known as:  LYRICA  Take 75 mg by mouth 2 (two) times daily.     progesterone 100 MG capsule  Commonly known as:  PROMETRIUM  Take 100 mg by mouth daily.     selenium 50 MCG Tabs tablet  Take 50 mcg by mouth daily.     VITAMIN A PO  Take 1 tablet by mouth daily.     VITAMIN B 12 PO  Take 1 tablet by mouth daily.     VITAMIN C PO  Take 1 tablet by mouth daily.     VITAMIN E PO  Take 1 tablet by mouth daily.     ZINC PO  Take 1  tablet by mouth daily.          Diet and Activity recommendation: See Discharge Instructions above   Consults obtained - Cards   Major procedures and Radiology Reports - PLEASE review detailed and final reports for all details, in brief -      Dg Chest 2 View  12/13/2014   CLINICAL DATA:  Cough and difficulty breathing for 2 days  EXAM: CHEST  2 VIEW  COMPARISON:  December 11, 2014  FINDINGS: There is underlying emphysematous change. There is again noted opacity in the lateral left base, probably representing a small area of  consolidation with small effusion. Lungs elsewhere clear. Heart is mildly enlarged with pulmonary vascularity within normal limits. No adenopathy. Bones are osteoporotic. There is a focal area of calcification in the left carotid artery.  IMPRESSION: Opacity lateral left base, upright representing combination of focal infiltrate with small effusion. Lungs elsewhere clear. Underlying emphysema. Stable cardiomegaly.   Electronically Signed   By: Bretta Bang M.D.   On: 12/13/2014 13:22    Micro Results     Recent Results (from the past 240 hour(s))  Culture, blood (routine x 2) Call MD if unable to obtain prior to antibiotics being given     Status: None (Preliminary result)   Collection Time: 12/13/14  6:15 PM  Result Value Ref Range Status   Specimen Description BLOOD RIGHT ARM  Final   Special Requests BOTTLES DRAWN AEROBIC AND ANAEROBIC  Final   Culture  Setup Time   Final    12/14/2014 02:30 Performed at [facility]    Culture   Final           BLOOD CULTURE RECEIVED NO GROWTH TO DATE CULTURE WILL BE HELD FOR 5 DAYS BEFORE ISSUING A FINAL NEGATIVE REPORT Performed at [facility]    Report Status PENDING  Incomplete  Culture, blood (routine x 2) Call MD if unable to obtain prior to antibiotics being given     Status: None (Preliminary result)   Collection Time: 12/13/14  6:30 PM  Result Value Ref Range Status   Specimen Description BLOOD RIGHT ARM  Final   Special Requests BOTTLES DRAWN AEROBIC ONLY 5CC  Final   Culture  Setup Time   Final    12/14/2014 02:30 Performed at [facility]    Culture   Final           BLOOD CULTURE RECEIVED NO GROWTH TO DATE CULTURE WILL BE HELD FOR 5 DAYS BEFORE ISSUING A FINAL NEGATIVE REPORT Performed at [facility]    Report Status PENDING  Incomplete       Today   Subjective:   Melanie Obrien today has no headache,no chest abdominal pain,no new weakness tingling or numbness, feels much better  wants to go home today.    Objective:   Blood pressure 117/77, pulse 93, temperature 97.8 F (36.6 C), temperature source Oral, resp. rate 18, height 4\' 7"  (1.397 m), weight 40.597 kg (89 lb 8 oz), SpO2 95 %.   Intake/Output Summary (Last 24 hours) at 12/16/14 1121 Last data filed at 12/16/14 1000  Gross per 24 hour  Intake   1120 ml  Output   1100 ml  Net     20 ml    Exam Awake Alert, Oriented x 3, No new F.N deficits, Normal affect [place].AT,PERRAL Supple Neck,No JVD, No cervical lymphadenopathy appriciated.  Symmetrical Chest wall movement, Good air movement bilaterally, CTAB RRR,No Gallops,Rubs or  new Murmurs, No Parasternal Heave +ve B.Sounds, Abd Soft, Non tender, No organomegaly appriciated, No rebound -guarding or rigidity. No Cyanosis, Clubbing , trace edema, No new Rash or bruise  Data Review   CBC w Diff: Lab Results  Component Value Date   WBC 7.8 12/16/2014   HGB 12.2 12/16/2014   HCT 36.4 12/16/2014   PLT 214 12/16/2014    CMP: Lab Results  Component Value Date   NA 135 12/16/2014   K 3.9 12/16/2014   CL 99 12/16/2014   CO2 29 12/16/2014   BUN 18 12/16/2014   CREATININE 0.65 12/16/2014  .   Total Time in preparing paper work, data evaluation and todays exam - 35 minutes  Leroy SeaSINGH,Waverly Tarquinio K M.D on 12/16/2014 at 11:21 AM  [facility] Office  [phone]

## 2014-12-17 LAB — LEGIONELLA ANTIGEN, URINE

## 2014-12-20 LAB — CULTURE, BLOOD (ROUTINE X 2)
CULTURE: NO GROWTH
Culture: NO GROWTH

## 2014-12-30 ENCOUNTER — Encounter: Payer: PRIVATE HEALTH INSURANCE | Admitting: Cardiology

## 2014-12-30 ENCOUNTER — Ambulatory Visit (INDEPENDENT_AMBULATORY_CARE_PROVIDER_SITE_OTHER): Payer: Medicare PPO | Admitting: Physician Assistant

## 2014-12-30 ENCOUNTER — Encounter: Payer: Self-pay | Admitting: Physician Assistant

## 2014-12-30 VITALS — BP 132/82 | HR 71 | Ht <= 58 in | Wt 100.1 lb

## 2014-12-30 DIAGNOSIS — J181 Lobar pneumonia, unspecified organism: Secondary | ICD-10-CM

## 2014-12-30 DIAGNOSIS — I5032 Chronic diastolic (congestive) heart failure: Secondary | ICD-10-CM

## 2014-12-30 DIAGNOSIS — J189 Pneumonia, unspecified organism: Secondary | ICD-10-CM

## 2014-12-30 DIAGNOSIS — I480 Paroxysmal atrial fibrillation: Secondary | ICD-10-CM

## 2014-12-30 MED ORDER — FUROSEMIDE 40 MG PO TABS: 40.0000 mg | ORAL_TABLET | ORAL | Status: DC

## 2014-12-30 MED ORDER — METOPROLOL SUCCINATE ER 100 MG PO TB24: 100.0000 mg | ORAL_TABLET | Freq: Every day | ORAL | Status: DC

## 2014-12-30 MED ORDER — FUROSEMIDE 20 MG PO TABS: 20.0000 mg | ORAL_TABLET | ORAL | Status: DC

## 2014-12-30 NOTE — Progress Notes (Signed)
Cardiology Office Note   Date:  12/30/2014   ID:  Melanie Obrien, DOB 1919-03-15, MRN [id number]  PCP:  Gaye AlkenBARNES,ELIZABETH STEWART, MD  Cardiologist:  Dr. Everette RankJay Varanasi     History of Present Illness: Melanie Obrien is a 81 y.o. female with a hx of PAF, prior TIA.  She was admitted last month for AFib with RVR in the setting of LLL pneumonia.  She required diuresis for volume overload.  EF was noted to be normal on Echo.  She was not felt to be a good candidate for anticoagulation due to age and fall risk.  She had intermittent NSR but reverted to AFib.    She returns today for follow-up. She is here with her daughter, Dr. Gweneth DimitriWendy McNeill.  Overall, her breathing is improved. She did seem to have more labored breathing today coming to the office. Weights have been fairly stable. She did have to increase her Lasix on one occasion. She denies orthopnea or PND. LE edema is mostly improved. She denies chest pain or syncope. Labs with her PCP apparently showed hyponatremia.   Studies:   - Echo (12/15):  EF 55-60%, no RWMA, mild AI, mild MR, PASP 46 mmHg, trivial pericardial effusion, L pleural effusion   Recent Labs: 12/13/2014: Pro B Natriuretic peptide (BNP) 2339.0* 12/14/2014: TSH 3.490 12/16/2014: BUN 18; Creatinine 0.65; Hemoglobin 12.2; Potassium 3.9; Sodium 135   Estimated Creatinine Clearance: 26.8 mL/min (by C-G formula based on Cr of 0.65).     Wt Readings from Last 3 Encounters:  12/30/14 100 lb 1.9 oz (45.414 kg)  12/16/14 89 lb 8 oz (40.597 kg)     Past Medical History  Diagnosis Date  . Sinus tachycardia   . Pneumonia   . Scoliosis   . TIA (transient ischemic attack)   . Trigeminal neuralgia of left side of face 12/13/2014  . Hot flashes 12/13/2014    Current Outpatient Prescriptions  Medication Sig Dispense Refill  . Ascorbic Acid (VITAMIN C PO) Take 1 tablet by mouth daily.    Marland Kitchen. b complex vitamins tablet Take 1 tablet by mouth daily.    Marland Kitchen. BETA CAROTENE PO Take  1 tablet by mouth daily.    Marland Kitchen. CALCIUM PO Take 1 tablet by mouth daily.    . CHROMIUM PO Take 1 tablet by mouth daily.    . clopidogrel (PLAVIX) 75 MG tablet Take 75 mg by mouth daily.    . Cyanocobalamin (VITAMIN B 12 PO) Take 1 tablet by mouth daily.    Marland Kitchen. estradiol (ESTRACE) 1 MG tablet Take 0.5 mg by mouth daily.    . fexofenadine (ALLEGRA) 180 MG tablet Take 180 mg by mouth daily.    . furosemide (LASIX) 20 MG tablet Take 1 tablet (20 mg total) by mouth daily. (Patient taking differently: Take 20 mg by mouth daily. Take one tab by mouth daily, only take two tabs by mouth daily for two days if weight increases by two pounds. Per Dr. Zachery DauerBarnes) 30 tablet 0  . MAGNESIUM PO Take 1 tablet by mouth daily.    . metoprolol succinate (TOPROL-XL) 25 MG 24 hr tablet Take 25 mg by mouth 2 (two) times daily. Take two 25 mg tablets by mouth in the morning and two 25 mg tablets in the evening.    . Multiple Vitamins-Minerals (MULTIVITAMIN PO) Take 1 tablet by mouth daily.    . Multiple Vitamins-Minerals (ZINC PO) Take 1 tablet by mouth daily.    Marland Kitchen. PANTOTHENIC ACID PO  Take 1 tablet by mouth daily.    . pregabalin (LYRICA) 75 MG capsule Take 75 mg by mouth 2 (two) times daily.    Marland Kitchen selenium 50 MCG TABS tablet Take 50 mcg by mouth daily.    Marland Kitchen VITAMIN A PO Take 1 tablet by mouth daily.    Marland Kitchen VITAMIN E PO Take 1 tablet by mouth daily.     No current facility-administered medications for this visit.     Allergies:   Codeine; Levaquin; and Xylocaine   Social History:  The patient  reports that she has never smoked. She does not have any smokeless tobacco history on file. She reports that she does not drink alcohol or use illicit drugs.   Family History:  The patient's family history is not on file.    ROS:  Please see the history of present illness.      All other systems reviewed and negative.    PHYSICAL EXAM: VS:  BP 132/82 mmHg  Pulse 71  Ht  (1.397 m)  Wt 100 lb 1.9 oz (45.414 kg)  BMI 23.27  kg/m2 Well nourished, well developed, in no acute distress HEENT: normal Neck: + JVD at 90 Cardiac:  normal S1, S2;  RRR;  no murmur   Lungs:  Decreased breath sounds bilaterally, no wheezing, rhonchi or rales Abd: soft, nontender, no hepatomegaly Ext: trace to 1+ bilateral LE edema Skin: warm and dry Neuro:  CNs 2-12 intact, no focal abnormalities noted  EKG:  NSR, HR 71, PACs, first-degree AV block (PR interval 238 ms), nonspecific ST-T wave changes      ASSESSMENT AND PLAN:  1.  Paroxysmal Atrial Fibrillation:  Maintaining normal sinus rhythm. She has been deemed a poor candidate for anticoagulation due to fall risk and advanced age. She is on Plavix for history of TIA. Continue current dose of Toprol XL 100 mg daily. 2.  Chronic Diastolic CHF:  Volume overall appears stable. However, her neck veins are elevated and she did have more labored breathing today.     -  Check a BNP, BMET.    -  Increase Lasix to 40 mg on Monday, Wednesday, Friday and 20 mg all other days.    -  Check BMET in 2 weeks. 3.  Community Acquired Pneumonia:   Seemingly resolved. Follow-up with primary care as indicated.  Disposition:   FU with Dr. Eldridge Dace in 1 month.   Signed, Brynda Rim, MHS 12/30/2014 3:45 PM    [facility] [address] [place], [place], [place]  [address] Phone: [phone]; Fax: [phone]

## 2014-12-30 NOTE — Patient Instructions (Addendum)
Your physician has recommended you make the following change in your medication:  1. INCREASE LASIX TO 40 MG ON MON, WED and FRI'S; ALL OTHER DAYS TAKE 20 MG 2. A REFILL FOR TOPROL XL WAS SENT IN FOR THE 100 MG TAB; TAKE 1 TABLET DAILY  LAB WORK TODAY; BMET, BNP  YOU HAVE BEEN GIVEN AN RX FOR REPEAT LAB WORK TO BE DONE WITH PRIMARY CARE IN 2 WEEKS; WITH THE RESULTS TO [facility], [facility] [phone]  Your physician recommends that you schedule a follow-up appointment on 02/04/15 @ 1:45 PM WITH DR. VARANASI

## 2014-12-31 ENCOUNTER — Telehealth: Payer: Self-pay | Admitting: Interventional Cardiology

## 2014-12-31 ENCOUNTER — Encounter: Payer: Self-pay | Admitting: Physician Assistant

## 2014-12-31 LAB — BRAIN NATRIURETIC PEPTIDE: PRO B NATRI PEPTIDE: 395 pg/mL — AB (ref 0.0–100.0)

## 2014-12-31 LAB — BASIC METABOLIC PANEL
BUN: 16 mg/dL (ref 6–23)
CALCIUM: 8.7 mg/dL (ref 8.4–10.5)
CO2: 31 meq/L (ref 19–32)
Chloride: 98 mEq/L (ref 96–112)
Creatinine, Ser: 0.7 mg/dL (ref 0.4–1.2)
GFR: 84.21 mL/min (ref >60.00)
GLUCOSE: 147 mg/dL — AB (ref 70–99)
Potassium: 5.8 mEq/L — ABNORMAL HIGH (ref 3.5–5.1)
SODIUM: 134 meq/L — AB (ref 135–145)

## 2014-12-31 NOTE — Telephone Encounter (Signed)
New message  ° ° °Patient daughter calling for test results.   °

## 2014-12-31 NOTE — Telephone Encounter (Signed)
s/w pt's daughter Toniann FailWendy who is pt's caretaker in regards to lab results and to have repeat bmet 1/8 due to ? hemolyzed sample, daughter is a Designer, fashion/clothingamily Practice MD and she will have lab work obtained at her office and will fax results to [employer] W. PA .

## 2015-01-01 ENCOUNTER — Encounter: Payer: Self-pay | Admitting: Physician Assistant

## 2015-01-04 ENCOUNTER — Encounter: Payer: PRIVATE HEALTH INSURANCE | Admitting: Physician Assistant

## 2015-01-13 ENCOUNTER — Encounter: Payer: Self-pay | Admitting: Interventional Cardiology

## 2015-01-13 ENCOUNTER — Ambulatory Visit (INDEPENDENT_AMBULATORY_CARE_PROVIDER_SITE_OTHER): Payer: Medicare PPO | Admitting: Interventional Cardiology

## 2015-01-13 ENCOUNTER — Ambulatory Visit (INDEPENDENT_AMBULATORY_CARE_PROVIDER_SITE_OTHER): Payer: Medicare PPO | Admitting: *Deleted

## 2015-01-13 VITALS — BP 102/72 | HR 66 | Ht <= 58 in | Wt 106.0 lb

## 2015-01-13 DIAGNOSIS — I480 Paroxysmal atrial fibrillation: Secondary | ICD-10-CM

## 2015-01-13 DIAGNOSIS — R55 Syncope and collapse: Secondary | ICD-10-CM

## 2015-01-13 DIAGNOSIS — R945 Abnormal results of liver function studies: Secondary | ICD-10-CM

## 2015-01-13 DIAGNOSIS — R7989 Other specified abnormal findings of blood chemistry: Secondary | ICD-10-CM

## 2015-01-13 DIAGNOSIS — I5031 Acute diastolic (congestive) heart failure: Secondary | ICD-10-CM

## 2015-01-13 LAB — MDC_IDC_ENUM_SESS_TYPE_INCLINIC

## 2015-01-13 NOTE — Progress Notes (Signed)
Patient ID: Melanie Obrien, female   DOB: 1919/07/01, 81 y.o.   MRN: [id number]    [address] [place], [place]  [address] Phone: [phone] Fax:  [phone]  Date:  01/16/2015   ID:  Melanie Obrien, DOB 1919/07/01, MRN [id number]  PCP:  Gaye AlkenBARNES,ELIZABETH STEWART, MD      History of Present Illness: Melanie Obrien is a 81 y.o. female who has had PAF and diastolic heart failure.  SHe was admitted and diuresed in the hospital in Dec 2015.  She was doing well after leaving the hospital. Over the last several weeks, she has noticed some increased lower extremity swelling. Her weight has also increased. She feels that her face is puffier than usual. She is a little bit more short of breath than usual. She just does not feel herself. She cannot feel any palpitations.  She had one visit with our PA after being in the hospital. She was in normal rhythm at that time. At her primary care doctor's office yesterday, she was found to be back in atrial fibrillation. Her daughter is a primary care doctor. I spoke with her about potentially starting amiodarone to maintain sinus rhythm. There were somewhat hesitant. The patient's lung capacity is also limited by scoliosis.  Cr 0.55 with PMD yesterday.  LFTs mildly elevated, thought to be form passive congestion. Wt Readings from Last 3 Encounters:  01/13/15 106 lb (48.081 kg)  12/30/14 100 lb 1.9 oz (45.414 kg)  12/16/14 89 lb 8 oz (40.597 kg)     Past Medical History  Diagnosis Date  . Sinus tachycardia   . Pneumonia   . Scoliosis   . TIA (transient ischemic attack)   . Trigeminal neuralgia of left side of face 12/13/2014  . Hot flashes 12/13/2014    Current Outpatient Prescriptions  Medication Sig Dispense Refill  . Ascorbic Acid (VITAMIN C PO) Take 1 tablet by mouth daily.    Marland Kitchen. b complex vitamins tablet Take 1 tablet by mouth daily.    Marland Kitchen. BETA CAROTENE PO Take 1 tablet by mouth daily.    Marland Kitchen. CALCIUM PO Take 1 tablet by mouth  daily.    . CHROMIUM PO Take 1 tablet by mouth daily.    . clopidogrel (PLAVIX) 75 MG tablet Take 75 mg by mouth daily.    . Cyanocobalamin (VITAMIN B 12 PO) Take 1 tablet by mouth daily.    Marland Kitchen. estradiol (ESTRACE) 1 MG tablet Take 0.5 mg by mouth daily.    . fexofenadine (ALLEGRA) 180 MG tablet Take 180 mg by mouth daily.    . furosemide (LASIX) 20 MG tablet Take 1 tablet (20 mg total) by mouth as directed. TAKE 2 TABS = 40 MG MON, WED, and FRI'S; ALL OTHER DAYS TAKE 1 TAB = 20 MG 90 tablet 11  . MAGNESIUM PO Take 1 tablet by mouth daily.    . metoprolol succinate (TOPROL-XL) 25 MG 24 hr tablet Take 25 mg by mouth daily. Take two 25MG  tablets by mouth in the morning and two 25mg  tablets in the evening.    . Multiple Vitamins-Minerals (MULTIVITAMIN PO) Take 1 tablet by mouth daily.    . Multiple Vitamins-Minerals (ZINC PO) Take 1 tablet by mouth daily.    Marland Kitchen. PANTOTHENIC ACID PO Take 1 tablet by mouth daily.    . pregabalin (LYRICA) 75 MG capsule Take 75 mg by mouth 2 (two) times daily.    . progesterone (PROMETRIUM) 100 MG capsule Take  100 mg by mouth daily.    Marland Kitchen selenium 50 MCG TABS tablet Take 50 mcg by mouth daily.    Marland Kitchen VITAMIN A PO Take 1 tablet by mouth daily.    Marland Kitchen VITAMIN E PO Take 1 tablet by mouth daily.     No current facility-administered medications for this visit.    Allergies:    Allergies  Allergen Reactions  . Codeine Other (See Comments)    unknown  . Levaquin [Levofloxacin In D5w] Itching  . Xylocaine [Lidocaine Hcl] Other (See Comments)    Uncontrolled shaking    Social History:  The patient  reports that she has never smoked. She does not have any smokeless tobacco history on file. She reports that she does not drink alcohol or use illicit drugs.   Family History:  The patient's family history is not on file.   ROS:  Please see the history of present illness.  No nausea, vomiting.  No fevers, chills.  No focal weakness.  No dysuria. As above; signs of fluid  overload; no palpitations.   All other systems reviewed and negative.   PHYSICAL EXAM: VS:  BP 102/72 mmHg  Pulse 66  Ht  (1.397 m)  Wt 106 lb (48.081 kg)  BMI 24.64 kg/m2 General: Well developed, well nourished, in no acute distress HEENT: normal Neck: no JVD, no carotid bruits Cardiac:  normal S1, S2; RRR; premature beats Lungs:  clear to auscultation bilaterally, no wheezing, rhonchi or rales Abd: soft, nontender, no hepatomegaly Ext: bilateral pitting edema2+ Skin: warm and dry Neuro:   no focal abnormalities noted Psych: normal affect      ASSESSMENT AND PLAN:  1. Acute on chronic diastolic heart failure:  Inrease Lasix to 40 mg BID for 4 days.  Try to get her weight from 106 to 100 lbs.  We discussed elective admission to the hospital for diuresis but she was not interested.  Out patient diuresis is reasonable.  She will come or a BMet on Monday and an office visit.   2. Trigger for CHF was likely going out of rhythm.  SHe was found to be out of rhythm yesterday with her PMD.  We discussed starting Amiodarone to maintain NSR since she seems to have CHF with the AFib.  She want to go back to the higher Metoprolol dose at this time and see what happens before trying Amio.  We also dscussed anticoagulation with Eliquis.  Given her comorbidities, she and her daughter are hesitant to try this.   3. Of note, patient had a LINQ monitor placed in [place] a few years ago.  She has not had it checked since being in [place].  I reviewed her monitor personally.  She has had mostly AFib in the past few months.  She has had some asymptomatic 3-4 second pauses.  She will have f/u arranged with EP MD.   LFTs mildly elevated, thought to be form passive congestion.  Signed, Fredric Mare, MD, [facility] 01/16/2015 8:46 PM

## 2015-01-13 NOTE — Patient Instructions (Addendum)
Your physician has recommended you make the following change in your medication:  1) START taking Lasix 40mg  twice daily through Saturday and then go back to original dosing starting Sunday.  Your physician recommends that you return for lab work on Monday (BMP, BNP)  Your physician recommends that you schedule a follow-up appointment on Monday (01/18/15) at 1:45 p.m.  [facility].

## 2015-01-13 NOTE — Progress Notes (Signed)
Loop check in clinic.  Pt with 111 tachy episodes that are T-wave oversensing ; 0 brady episodes; 4 pause episodes 3-4 seconds at random times without symptoms.    Plan to enroll in remote follow up and see in clinic annually.  Patient to be scheduled to establish with EP.

## 2015-01-16 ENCOUNTER — Encounter: Payer: Self-pay | Admitting: Interventional Cardiology

## 2015-01-16 DIAGNOSIS — R945 Abnormal results of liver function studies: Secondary | ICD-10-CM

## 2015-01-16 DIAGNOSIS — R7989 Other specified abnormal findings of blood chemistry: Secondary | ICD-10-CM | POA: Insufficient documentation

## 2015-01-16 DIAGNOSIS — R55 Syncope and collapse: Secondary | ICD-10-CM | POA: Insufficient documentation

## 2015-01-18 ENCOUNTER — Encounter: Payer: Self-pay | Admitting: Interventional Cardiology

## 2015-01-18 ENCOUNTER — Ambulatory Visit (INDEPENDENT_AMBULATORY_CARE_PROVIDER_SITE_OTHER): Payer: Medicare PPO | Admitting: Interventional Cardiology

## 2015-01-18 ENCOUNTER — Other Ambulatory Visit (INDEPENDENT_AMBULATORY_CARE_PROVIDER_SITE_OTHER): Payer: Medicare PPO

## 2015-01-18 ENCOUNTER — Other Ambulatory Visit: Payer: Self-pay | Admitting: Interventional Cardiology

## 2015-01-18 ENCOUNTER — Inpatient Hospital Stay (HOSPITAL_COMMUNITY)
Admission: AD | Admit: 2015-01-18 | Discharge: 2015-01-23 | DRG: 292 | Disposition: A | Discharge status: Home / Self Care | Payer: Medicare PPO | Source: Ambulatory Visit | Attending: Interventional Cardiology | Admitting: Interventional Cardiology

## 2015-01-18 ENCOUNTER — Encounter (HOSPITAL_COMMUNITY): Payer: Self-pay | Admitting: Interventional Cardiology

## 2015-01-18 VITALS — BP 110/72 | HR 110 | Ht <= 58 in | Wt 104.6 lb

## 2015-01-18 DIAGNOSIS — Z888 Allergy status to other drugs, medicaments and biological substances status: Secondary | ICD-10-CM

## 2015-01-18 DIAGNOSIS — I480 Paroxysmal atrial fibrillation: Secondary | ICD-10-CM | POA: Diagnosis present

## 2015-01-18 DIAGNOSIS — Z885 Allergy status to narcotic agent status: Secondary | ICD-10-CM

## 2015-01-18 DIAGNOSIS — K769 Liver disease, unspecified: Secondary | ICD-10-CM | POA: Diagnosis present

## 2015-01-18 DIAGNOSIS — R600 Localized edema: Secondary | ICD-10-CM | POA: Diagnosis present

## 2015-01-18 DIAGNOSIS — I5033 Acute on chronic diastolic (congestive) heart failure: Principal | ICD-10-CM | POA: Diagnosis present

## 2015-01-18 DIAGNOSIS — Z6822 Body mass index (BMI) 22.0-22.9, adult: Secondary | ICD-10-CM

## 2015-01-18 DIAGNOSIS — I5032 Chronic diastolic (congestive) heart failure: Secondary | ICD-10-CM | POA: Insufficient documentation

## 2015-01-18 DIAGNOSIS — Z8701 Personal history of pneumonia (recurrent): Secondary | ICD-10-CM

## 2015-01-18 DIAGNOSIS — M419 Scoliosis, unspecified: Secondary | ICD-10-CM | POA: Diagnosis present

## 2015-01-18 DIAGNOSIS — E440 Moderate protein-calorie malnutrition: Secondary | ICD-10-CM | POA: Diagnosis present

## 2015-01-18 DIAGNOSIS — Z8673 Personal history of transient ischemic attack (TIA), and cerebral infarction without residual deficits: Secondary | ICD-10-CM

## 2015-01-18 DIAGNOSIS — I5031 Acute diastolic (congestive) heart failure: Secondary | ICD-10-CM

## 2015-01-18 DIAGNOSIS — Z881 Allergy status to other antibiotic agents status: Secondary | ICD-10-CM

## 2015-01-18 DIAGNOSIS — I4891 Unspecified atrial fibrillation: Secondary | ICD-10-CM | POA: Diagnosis present

## 2015-01-18 HISTORY — DX: Acute on chronic diastolic (congestive) heart failure: I50.33

## 2015-01-18 HISTORY — DX: Unspecified atrial fibrillation: I48.91

## 2015-01-18 HISTORY — DX: Heart failure, unspecified: I50.9

## 2015-01-18 LAB — CBC WITH DIFFERENTIAL/PLATELET
Basophils Absolute: 0.1 10*3/uL (ref 0.0–0.1)
Basophils Relative: 1 % (ref 0–1)
EOS ABS: 0.2 10*3/uL (ref 0.0–0.7)
Eosinophils Relative: 3 % (ref 0–5)
HEMATOCRIT: 32.6 % — AB (ref 36.0–46.0)
Hemoglobin: 11.2 g/dL — ABNORMAL LOW (ref 12.0–15.0)
Lymphocytes Relative: 18 % (ref 12–46)
Lymphs Abs: 1.3 10*3/uL (ref 0.7–4.0)
MCH: 32 pg (ref 26.0–34.0)
MCHC: 34.4 g/dL (ref 30.0–36.0)
MCV: 93.1 fL (ref 78.0–100.0)
MONO ABS: 0.6 10*3/uL (ref 0.1–1.0)
MONOS PCT: 9 % (ref 3–12)
Neutro Abs: 5.1 10*3/uL (ref 1.7–7.7)
Neutrophils Relative %: 69 % (ref 43–77)
Platelets: 184 10*3/uL (ref 150–400)
RBC: 3.5 MIL/uL — AB (ref 3.87–5.11)
RDW: 14.8 % (ref 11.5–15.5)
WBC: 7.2 10*3/uL (ref 4.0–10.5)

## 2015-01-18 LAB — COMPREHENSIVE METABOLIC PANEL
ALT: 65 U/L — AB (ref 0–35)
ANION GAP: 7 (ref 5–15)
AST: 55 U/L — AB (ref 0–37)
Albumin: 2.9 g/dL — ABNORMAL LOW (ref 3.5–5.2)
Alkaline Phosphatase: 106 U/L (ref 39–117)
BUN: 20 mg/dL (ref 6–23)
CALCIUM: 8.2 mg/dL — AB (ref 8.4–10.5)
CO2: 27 mmol/L (ref 19–32)
Chloride: 100 mmol/L (ref 96–112)
Creatinine, Ser: 0.6 mg/dL (ref 0.50–1.10)
GFR calc Af Amer: 88 mL/min — ABNORMAL LOW (ref >90)
GFR, EST NON AFRICAN AMERICAN: 76 mL/min — AB (ref >90)
GLUCOSE: 93 mg/dL (ref 70–99)
POTASSIUM: 4.3 mmol/L (ref 3.5–5.1)
Sodium: 134 mmol/L — ABNORMAL LOW (ref 135–145)
TOTAL PROTEIN: 5.2 g/dL — AB (ref 6.0–8.3)
Total Bilirubin: 0.6 mg/dL (ref 0.3–1.2)

## 2015-01-18 LAB — BRAIN NATRIURETIC PEPTIDE: B NATRIURETIC PEPTIDE 5: 526.6 pg/mL — AB (ref 0.0–100.0)

## 2015-01-18 LAB — GLUCOSE, CAPILLARY
GLUCOSE-CAPILLARY: 104 mg/dL — AB (ref 70–99)
Glucose-Capillary: 142 mg/dL — ABNORMAL HIGH (ref 70–99)

## 2015-01-18 MED ORDER — ONDANSETRON HCL 4 MG/2ML IJ SOLN: 4.0000 mg | Freq: Four times a day (QID) | INTRAMUSCULAR | Status: DC | PRN

## 2015-01-18 MED ORDER — CLOPIDOGREL BISULFATE 75 MG PO TABS
75.0000 mg | ORAL_TABLET | Freq: Every day | ORAL | Status: DC
  Administered 2015-01-19 – 2015-01-23 (×5): 75 mg via ORAL
  Filled 2015-01-18 (×6): qty 1

## 2015-01-18 MED ORDER — SODIUM CHLORIDE 0.9 % IJ SOLN: 3.0000 mL | INTRAMUSCULAR | Status: DC | PRN

## 2015-01-18 MED ORDER — FUROSEMIDE 10 MG/ML IJ SOLN
40.0000 mg | Freq: Two times a day (BID) | INTRAMUSCULAR | Status: DC
  Administered 2015-01-18 – 2015-01-19 (×3): 40 mg via INTRAVENOUS
  Filled 2015-01-18 (×4): qty 4

## 2015-01-18 MED ORDER — ACETAMINOPHEN 325 MG PO TABS: 650.0000 mg | ORAL_TABLET | ORAL | Status: DC | PRN

## 2015-01-18 MED ORDER — METOPROLOL SUCCINATE ER 100 MG PO TB24
100.0000 mg | ORAL_TABLET | Freq: Every day | ORAL | Status: DC
  Administered 2015-01-19 – 2015-01-20 (×2): 100 mg via ORAL
  Filled 2015-01-18 (×2): qty 1

## 2015-01-18 MED ORDER — HEPARIN BOLUS VIA INFUSION
2000.0000 [IU] | Freq: Once | INTRAVENOUS | Status: AC
  Administered 2015-01-18: 2000 [IU] via INTRAVENOUS
  Filled 2015-01-18: qty 2000

## 2015-01-18 MED ORDER — PREGABALIN 25 MG PO CAPS
75.0000 mg | ORAL_CAPSULE | Freq: Two times a day (BID) | ORAL | Status: DC
  Administered 2015-01-18 – 2015-01-23 (×10): 75 mg via ORAL
  Filled 2015-01-18 (×10): qty 3

## 2015-01-18 MED ORDER — SODIUM CHLORIDE 0.9 % IJ SOLN
3.0000 mL | Freq: Two times a day (BID) | INTRAMUSCULAR | Status: DC
  Administered 2015-01-19 – 2015-01-23 (×6): 3 mL via INTRAVENOUS

## 2015-01-18 MED ORDER — HEPARIN (PORCINE) IN NACL 100-0.45 UNIT/ML-% IJ SOLN
650.0000 [IU]/h | INTRAMUSCULAR | Status: DC
  Administered 2015-01-18 – 2015-01-20 (×2): 650 [IU]/h via INTRAVENOUS
  Filled 2015-01-18 (×3): qty 250

## 2015-01-18 MED ORDER — LORATADINE 10 MG PO TABS
10.0000 mg | ORAL_TABLET | Freq: Every day | ORAL | Status: DC
Start: 2015-01-19 — End: 2015-01-23
  Administered 2015-01-19 – 2015-01-20 (×2): 10 mg via ORAL
  Filled 2015-01-18 (×5): qty 1

## 2015-01-18 MED ORDER — SODIUM CHLORIDE 0.9 % IV SOLN
250.0000 mL | INTRAVENOUS | Status: DC | PRN
  Administered 2015-01-18 – 2015-01-22 (×3): 250 mL via INTRAVENOUS

## 2015-01-18 NOTE — Progress Notes (Signed)
ANTICOAGULATION CONSULT NOTE - Initial Consult  Pharmacy Consult for Heparin Indication: atrial fibrillation  Allergies  Allergen Reactions  . Codeine Other (See Comments)    unknown  . Levaquin [Levofloxacin In D5w] Itching  . Xylocaine [Lidocaine Hcl] Other (See Comments)    Uncontrolled shaking    Patient Measurements: Height: 4\' 7"  (139.7 cm) Weight: 101 lb 12.8 oz (46.176 kg) IBW/kg (Calculated) : 34 Heparin Dosing Weight: 43.5 kg  Vital Signs: Temp: 97.6 F (36.4 C) (01/25 1628) Temp Source: Oral (01/25 1628) BP: 116/77 mmHg (01/25 1628) Pulse Rate: 98 (01/25 1628)  Labs: No results for input(s): HGB, HCT, PLT, APTT, LABPROT, INR, HEPARINUNFRC, CREATININE, CKTOTAL, CKMB, TROPONINI in the last 72 hours.  Estimated Creatinine Clearance: 26.4 mL/min (by C-G formula based on Cr of 0.7).   Medical History: Past Medical History  Diagnosis Date  . Sinus tachycardia   . Pneumonia   . Scoliosis   . TIA (transient ischemic attack)   . Trigeminal neuralgia of left side of face 12/13/2014  . Hot flashes 12/13/2014  . Acute on chronic diastolic heart failure   . Atrial fibrillation   . Lower extremity edema     Medications:  Prescriptions prior to admission  Medication Sig Dispense Refill Last Dose  . Ascorbic Acid (VITAMIN C PO) Take 1 tablet by mouth daily.   Taking  . b complex vitamins tablet Take 1 tablet by mouth daily.   Taking  . BETA CAROTENE PO Take 1 tablet by mouth daily.   Taking  . CALCIUM PO Take 1 tablet by mouth daily.   Taking  . CHROMIUM PO Take 1 tablet by mouth daily.   Taking  . clopidogrel (PLAVIX) 75 MG tablet Take 75 mg by mouth daily.   Taking  . Cyanocobalamin (VITAMIN B 12 PO) Take 1 tablet by mouth daily.   Taking  . estradiol (ESTRACE) 1 MG tablet Take 0.5 mg by mouth daily.   Taking  . fexofenadine (ALLEGRA) 180 MG tablet Take 180 mg by mouth daily.   Taking  . furosemide (LASIX) 20 MG tablet Take 1 tablet (20 mg total) by mouth as  directed. TAKE 2 TABS = 40 MG MON, WED, and FRI'S; ALL OTHER DAYS TAKE 1 TAB = 20 MG (Patient taking differently: Take 20 mg by mouth as directed. TAKE 2 TABS = 40 MG MON, WED, and FRI'S; ALL OTHER DAYS TAKE 1 TAB = 20 MG) 90 tablet 11 Taking  . MAGNESIUM PO Take 1 tablet by mouth daily.   Taking  . metoprolol succinate (TOPROL-XL) 25 MG 24 hr tablet Take 25 mg by mouth daily. Take two 25MG  tablets by mouth in the morning and two 25mg  tablets in the evening.   Taking  . Multiple Vitamins-Minerals (MULTIVITAMIN PO) Take 1 tablet by mouth daily.   Taking  . Multiple Vitamins-Minerals (ZINC PO) Take 1 tablet by mouth daily.   Taking  . PANTOTHENIC ACID PO Take 1 tablet by mouth daily.   Taking  . pregabalin (LYRICA) 75 MG capsule Take 75 mg by mouth 2 (two) times daily.   Taking  . progesterone (PROMETRIUM) 100 MG capsule Take 100 mg by mouth daily.   Taking  . selenium 50 MCG TABS tablet Take 50 mcg by mouth daily.   Taking  . VITAMIN A PO Take 1 tablet by mouth daily.   Taking  . VITAMIN E PO Take 1 tablet by mouth daily.   Taking   Scheduled:  . furosemide  40 mg Intravenous BID  . sodium chloride  3 mL Intravenous Q12H    Assessment: 81yo female with history of PAF and diastolic HF presents with edema and SOB. Pharmacy is consulted to dose heparin for atrial fibrillation. Pt is not on anticoagulation at home, but is on Plavix. Per the note, she has declined Eliquis in the past. CBC and sCr are wnl.  Goal of Therapy:  Heparin level 0.3-0.7 units/ml Monitor platelets by anticoagulation protocol: Yes   Plan:  Give 2000 units bolus x 1 Start heparin infusion at 650 units/hr Check anti-Xa level in 8 hours and daily while on heparin Continue to monitor H&H and platelets  F/u long term anticoagulation plan  Arlean Hopping. Newman Pies, PharmD Clinical Pharmacist Pager [phone] 01/18/2015,4:33 PM

## 2015-01-18 NOTE — Addendum Note (Signed)
Addended by: Tonita PhoenixBOWDEN, ROBIN K on: 01/18/2015 03:51 PM   Modules accepted: Orders

## 2015-01-18 NOTE — H&P (Signed)
Melanie Obrien is an 81 y.o. female.   Primary Cardiologist: Irish Lack PMD: Chief Complaint:  Edema, SHOB HPI: Melanie Obrien is a 81 y.o. female who has had PAF and diastolic heart failure. SHe was admitted and diuresed in the hospital in Dec 2015. She was doing well after leaving the hospital. Over the last several weeks, she has noticed some increased lower extremity swelling. Her weight has also increased. She feels that her face is puffier than usual. She is a little bit more short of breath than usual. She just does not feel herself. She cannot feel any palpitations.  She had one visit with our PA after being in the hospital. She was in normal rhythm at that time. At her primary care doctor's office yesterday, she was found to be back in atrial fibrillation. Her daughter is a primary care doctor. I spoke with her about potentially starting amiodarone to maintain sinus rhythm. There were somewhat hesitant. The patient's lung capacity is also limited by scoliosis.  LFTs mildly elevated, thought to be form passive congestion.  She returns today 5 days later for routine follow-up. She continues to have lower extremity edema. She had no significant diuresis. Her breathing is still somewhat impaired. She is now more willing to admitted to the hospital for elective IV diuresis.   Past Medical History  Diagnosis Date  . Sinus tachycardia   . Pneumonia   . Scoliosis   . TIA (transient ischemic attack)   . Trigeminal neuralgia of left side of face 12/13/2014  . Hot flashes 12/13/2014  . Acute on chronic diastolic heart failure   . Atrial fibrillation   . Lower extremity edema   . CHF (congestive heart failure)   . Shortness of breath dyspnea     Past Surgical History  Procedure Laterality Date  . Tonsillectomy      Family History  Problem Relation Age of Onset  . Hypertension Father    Social History:  reports that she has never smoked. She has never used smokeless tobacco. She reports  that she does not drink alcohol or use illicit drugs.  Allergies:  Allergies  Allergen Reactions  . Codeine Other (See Comments)    unknown  . Levaquin [Levofloxacin In D5w] Itching  . Xylocaine [Lidocaine Hcl] Other (See Comments)    Uncontrolled shaking    Medications Prior to Admission  Medication Sig Dispense Refill  . Ascorbic Acid (VITAMIN C PO) Take 1 g by mouth 3 (three) times daily.     Marland Kitchen b complex vitamins tablet Take 0.5 tablets by mouth 3 (three) times daily.     Marland Kitchen BETA CAROTENE PO Take 1 tablet by mouth daily.    Marland Kitchen CALCIUM PO Take 1 tablet by mouth daily.    . Cholecalciferol (VITAMIN D-3 PO) Take 1 tablet by mouth daily.    . CHROMIUM PO Take 1 tablet by mouth daily.    . clopidogrel (PLAVIX) 75 MG tablet Take 75 mg by mouth daily.    . Cyanocobalamin (VITAMIN B 12 PO) Take 1 tablet by mouth daily.    Marland Kitchen estradiol (ESTRACE) 1 MG tablet Take 0.5 mg by mouth daily.    . fexofenadine (ALLEGRA) 180 MG tablet Take 180 mg by mouth daily.    . furosemide (LASIX) 20 MG tablet Take 1 tablet (20 mg total) by mouth as directed. TAKE 2 TABS = 40 MG MON, WED, and FRI'S; ALL OTHER DAYS TAKE 1 TAB = 20 MG (Patient taking differently: Take  20 mg by mouth as directed. TAKE 2 TABS = 40 MG MON, WED, and FRI'S; ALL OTHER DAYS TAKE 1 TAB = 20 MG) 90 tablet 11  . MAGNESIUM PO Take 1 tablet by mouth daily.    . metoprolol succinate (TOPROL-XL) 25 MG 24 hr tablet Take 50 mg by mouth 2 (two) times daily. Take two 25MG tablets by mouth in the morning and two 58m tablets in the evening.    . Multiple Vitamins-Minerals (MULTIVITAMIN PO) Take 1 tablet by mouth daily.    . Multiple Vitamins-Minerals (ZINC PO) Take 1 tablet by mouth daily.    .Marland KitchenPANTOTHENIC ACID PO Take 1 tablet by mouth daily.    . pregabalin (LYRICA) 75 MG capsule Take 75 mg by mouth 2 (two) times daily.    . progesterone (PROMETRIUM) 100 MG capsule Take 100 mg by mouth every other day.     . selenium 50 MCG TABS tablet Take 50 mcg  by mouth daily.    .Marland KitchenVITAMIN A PO Take 1 tablet by mouth daily.    .Marland KitchenVITAMIN E PO Take 1 tablet by mouth daily.      Results for orders placed or performed during the hospital encounter of 01/18/15 (from the past 48 hour(s))  CBC WITH DIFFERENTIAL     Status: Abnormal   Collection Time: 01/18/15  5:15 PM  Result Value Ref Range   WBC 7.2 4.0 - 10.5 K/uL   RBC 3.50 (L) 3.87 - 5.11 MIL/uL   Hemoglobin 11.2 (L) 12.0 - 15.0 g/dL   HCT 32.6 (L) 36.0 - 46.0 %   MCV 93.1 78.0 - 100.0 fL   MCH 32.0 26.0 - 34.0 pg   MCHC 34.4 30.0 - 36.0 g/dL   RDW 14.8 11.5 - 15.5 %   Platelets 184 150 - 400 K/uL   Neutrophils Relative % 69 43 - 77 %   Neutro Abs 5.1 1.7 - 7.7 K/uL   Lymphocytes Relative 18 12 - 46 %   Lymphs Abs 1.3 0.7 - 4.0 K/uL   Monocytes Relative 9 3 - 12 %   Monocytes Absolute 0.6 0.1 - 1.0 K/uL   Eosinophils Relative 3 0 - 5 %   Eosinophils Absolute 0.2 0.0 - 0.7 K/uL   Basophils Relative 1 0 - 1 %   Basophils Absolute 0.1 0.0 - 0.1 K/uL  Comprehensive metabolic panel     Status: Abnormal   Collection Time: 01/18/15  5:15 PM  Result Value Ref Range   Sodium 134 (L) 135 - 145 mmol/L   Potassium 4.3 3.5 - 5.1 mmol/L   Chloride 100 96 - 112 mmol/L   CO2 27 19 - 32 mmol/L   Glucose, Bld 93 70 - 99 mg/dL   BUN 20 6 - 23 mg/dL   Creatinine, Ser 0.60 0.50 - 1.10 mg/dL   Calcium 8.2 (L) 8.4 - 10.5 mg/dL   Total Protein 5.2 (L) 6.0 - 8.3 g/dL   Albumin 2.9 (L) 3.5 - 5.2 g/dL   AST 55 (H) 0 - 37 U/L   ALT 65 (H) 0 - 35 U/L   Alkaline Phosphatase 106 39 - 117 U/L   Total Bilirubin 0.6 0.3 - 1.2 mg/dL   GFR calc non Af Amer 76 (L) >90 mL/min   GFR calc Af Amer 88 (L) >90 mL/min    Comment: (NOTE) The eGFR has been calculated using the CKD EPI equation. This calculation has not been validated in all clinical situations. eGFR's persistently <90  mL/min signify possible Chronic Kidney Disease.    Anion gap 7 5 - 15  Brain natriuretic peptide     Status: Abnormal   Collection  Time: 01/18/15  5:17 PM  Result Value Ref Range   B Natriuretic Peptide 526.6 (H) 0.0 - 100.0 pg/mL  Glucose, capillary     Status: Abnormal   Collection Time: 01/18/15  5:26 PM  Result Value Ref Range   Glucose-Capillary 104 (H) 70 - 99 mg/dL   Comment 1 Notify RN   Glucose, capillary     Status: Abnormal   Collection Time: 01/18/15  9:52 PM  Result Value Ref Range   Glucose-Capillary 142 (H) 70 - 99 mg/dL  Heparin level (unfractionated)     Status: None   Collection Time: 01/19/15 12:58 AM  Result Value Ref Range   Heparin Unfractionated 0.32 0.30 - 0.70 IU/mL    Comment:        IF HEPARIN RESULTS ARE BELOW EXPECTED VALUES, AND PATIENT DOSAGE HAS BEEN CONFIRMED, SUGGEST FOLLOW UP TESTING OF ANTITHROMBIN III LEVELS.   Basic metabolic panel     Status: Abnormal   Collection Time: 01/19/15 12:58 AM  Result Value Ref Range   Sodium 136 135 - 145 mmol/L   Potassium 4.1 3.5 - 5.1 mmol/L   Chloride 99 96 - 112 mmol/L   CO2 31 19 - 32 mmol/L   Glucose, Bld 97 70 - 99 mg/dL   BUN 20 6 - 23 mg/dL   Creatinine, Ser 0.65 0.50 - 1.10 mg/dL   Calcium 8.3 (L) 8.4 - 10.5 mg/dL   GFR calc non Af Amer 74 (L) >90 mL/min   GFR calc Af Amer 85 (L) >90 mL/min    Comment: (NOTE) The eGFR has been calculated using the CKD EPI equation. This calculation has not been validated in all clinical situations. eGFR's persistently <90 mL/min signify possible Chronic Kidney Disease.    Anion gap 6 5 - 15  CBC     Status: Abnormal   Collection Time: 01/19/15 12:58 AM  Result Value Ref Range   WBC 8.0 4.0 - 10.5 K/uL   RBC 3.82 (L) 3.87 - 5.11 MIL/uL   Hemoglobin 12.0 12.0 - 15.0 g/dL   HCT 35.0 (L) 36.0 - 46.0 %   MCV 91.6 78.0 - 100.0 fL   MCH 31.4 26.0 - 34.0 pg   MCHC 34.3 30.0 - 36.0 g/dL   RDW 14.8 11.5 - 15.5 %   Platelets 202 150 - 400 K/uL  Glucose, capillary     Status: None   Collection Time: 01/19/15  6:23 AM  Result Value Ref Range   Glucose-Capillary 87 70 - 99 mg/dL   No  results found.  ROS: Increased swelling, shortness of breath. No chest pain. No orthopnea. Rash on her lower extremities noted. No focal weakness. No bleeding problems. All other systems negative.  OBJECTIVE:   Vitals:   Filed Vitals:   01/18/15 2047 01/19/15 0223 01/19/15 0548 01/19/15 0555  BP: 136/71 117/81 123/65   Pulse: 97 93 95   Temp: 97.8 F (36.6 C) 98.3 F (36.8 C) 97.9 F (36.6 C)   TempSrc: Oral Oral Oral   Resp: 18 18 18    Height:      Weight:    98 lb 14.4 oz (44.861 kg)  SpO2: 98% 95% 98%    I&O's:    Intake/Output Summary (Last 24 hours) at 01/19/15 0731 Last data filed at 01/19/15 0600  Gross per 24 hour  Intake 219.41 ml  Output   1870 ml  Net -1650.59 ml   :     PHYSICAL EXAM General: Well developed, well nourished, in no acute distress Head:   Normal cephalic and atramatic  Lungs:   Clear bilaterally to auscultation. Bibasilar crackles Heart:  Irregularly irregular, S1 S2  No JVD.   Abdomen: abdomen soft and non-tender Msk:  Back normal,  Normal strength and tone for age. Extremities:  Bilateral 2+ pitting edema.  Multiple petechiae noted Neuro: Alert and oriented. Psych:  Normal affect, responds appropriately  LABS: Basic Metabolic Panel:  Recent Labs  01/18/15 1715 01/19/15 0058  NA 134* 136  K 4.3 4.1  CL 100 99  CO2 27 31  GLUCOSE 93 97  BUN 20 20  CREATININE 0.60 0.65  CALCIUM 8.2* 8.3*   Liver Function Tests:  Recent Labs  01/18/15 1715  AST 55*  ALT 65*  ALKPHOS 106  BILITOT 0.6  PROT 5.2*  ALBUMIN 2.9*   No results for input(s): LIPASE, AMYLASE in the last 72 hours. CBC:  Recent Labs  01/18/15 1715 01/19/15 0058  WBC 7.2 8.0  NEUTROABS 5.1  --   HGB 11.2* 12.0  HCT 32.6* 35.0*  MCV 93.1 91.6  PLT 184 202   Cardiac Enzymes: No results for input(s): CKTOTAL, CKMB, CKMBINDEX, TROPONINI in the last 72 hours. BNP: Invalid input(s): POCBNP D-Dimer: No results for input(s): DDIMER in the last 72  hours. Hemoglobin A1C: No results for input(s): HGBA1C in the last 72 hours. Fasting Lipid Panel: No results for input(s): CHOL, HDL, LDLCALC, TRIG, CHOLHDL, LDLDIRECT in the last 72 hours. Thyroid Function Tests: No results for input(s): TSH, T4TOTAL, T3FREE, THYROIDAB in the last 72 hours.  Invalid input(s): FREET3 Anemia Panel: No results for input(s): VITAMINB12, FOLATE, FERRITIN, TIBC, IRON, RETICCTPCT in the last 72 hours. Coag Panel:   No results found for: INR, PROTIME     Assessment/Plan 1. Acute on chronic diastolic heart failure: Increased Lasix to 40 mg BID for 4 days- now back to the usual 20-40 mg daily dose. Tried to get her weight from 106 to 100 lbs. Weight now has only decreased to 104 pounds. We discussed elective admission to the hospital for diuresis and she is now agreeable.  2. Trigger for CHF was likely going out of rhythm. SHe was found to be out of rhythm yesterday with her PMD. We discussed starting Amiodarone to maintain NSR since she seems to have CHF with the AFib. She went back to the higher Metoprolol dose in an attempt to avoid amiodarone, but continues to be in atrial fibrillation. She declined anticoagulation with Eliquis in the past. Given her comorbidities, she and her daughter are hesitant to try this. She is willing to take IV heparin in the hospital. Given the LFT abnormalities and a history of lung disease, amiodarone may not be the best choice. Would consider sotalol since she will be in the hospital. 3. Of note, patient had a LINQ monitor placed in [place] a few years ago.At last visit, it showed mostly AFib in the past few months. She had some asymptomatic 3-4 second pauses. She will have f/u arranged with EP MD. No syncope or lightheadedness.  4.  LFTs mildly elevated, thought to be form passive congestion. She now has some jaundice on exam. Will check LFTs and she may need a GI evaluation. 5. CODE STATUS discussed with the patient  and her daughter who is also a physician. The patient does not  want to be intubated. She would accept any other type of resuscitation. The limited resuscitation orders were placed. 6. She does not want her supplements restarted while she is in the hospital. She will only take her prescription medicines.  Kinsley Nicklaus S. 01/18/15  4:28 PM

## 2015-01-18 NOTE — Patient Instructions (Signed)
[facility].  [facility]

## 2015-01-18 NOTE — Progress Notes (Signed)
Patient ID: Melanie Obrien, female   DOB: 1919/04/28, 81 y.o.   MRN: [id number]    [address] [place], [place]  [address] Phone: [phone] Fax:  [phone]  Date:  01/18/2015   ID:  Melanie Obrien, DOB 1919/04/28, MRN [id number]  PCP:  Gaye AlkenBARNES,ELIZABETH STEWART, MD      History of Present Illness: Melanie Obrien is a 81 y.o. female who has had PAF and diastolic heart failure.  SHe was admitted and diuresed in the hospital in Dec 2015.  She was doing well after leaving the hospital. Over the last several weeks, she has noticed some increased lower extremity swelling. Her weight has also increased. She feels that her face is puffier than usual. She is a little bit more short of breath than usual. She just does not feel herself. She cannot feel any palpitations.  She had one visit with our PA after being in the hospital. She was in normal rhythm at that time. At her primary care doctor's office yesterday, she was found to be back in atrial fibrillation. Her daughter is a primary care doctor. I spoke with her about potentially starting amiodarone to maintain sinus rhythm. There were somewhat hesitant. The patient's lung capacity is also limited by scoliosis.    LFTs mildly elevated, thought to be form passive congestion.  She returns today 5 days later for routine follow-up. She continues to have lower extremity edema. She had no significant diuresis. Her breathing is still somewhat impaired. She is now more willing to admitted to the hospital for elective IV diuresis.  Wt Readings from Last 3 Encounters:  01/18/15 104 lb 9.6 oz (47.446 kg)  01/13/15 106 lb (48.081 kg)  12/30/14 100 lb 1.9 oz (45.414 kg)     Past Medical History  Diagnosis Date  . Sinus tachycardia   . Pneumonia   . Scoliosis   . TIA (transient ischemic attack)   . Trigeminal neuralgia of left side of face 12/13/2014  . Hot flashes 12/13/2014    No current outpatient prescriptions on file.   No  current facility-administered medications for this visit.    Allergies:    Allergies  Allergen Reactions  . Codeine Other (See Comments)    unknown  . Levaquin [Levofloxacin In D5w] Itching  . Xylocaine [Lidocaine Hcl] Other (See Comments)    Uncontrolled shaking    Social History:  The patient  reports that she has never smoked. She does not have any smokeless tobacco history on file. She reports that she does not drink alcohol or use illicit drugs.   Family History:  The patient's family history includes Hypertension in her father.   ROS:  Please see the history of present illness.  No nausea, vomiting.  No fevers, chills.  No focal weakness.  No dysuria. As above; signs of fluid overload; no palpitations.   All other systems reviewed and negative.   PHYSICAL EXAM: VS:  BP 110/72 mmHg  Pulse 110  Ht 4\' 7"  (1.397 m)  Wt 104 lb 9.6 oz (47.446 kg)  BMI 24.31 kg/m2  SpO2 98% General: Well developed, well nourished, in no acute distress HEENT: normal Neck: no JVD, no carotid bruits Cardiac:  normal S1, S2; irregularly irregular Lungs:  clear to auscultation bilaterally, no wheezing, rhonchi or rales Abd: soft, nontender, no hepatomegaly Ext: bilateral pitting edema2+ Skin: warm and dry Neuro:   no focal abnormalities noted Psych: normal affect      ASSESSMENT AND  PLAN:  1. Acute on chronic diastolic heart failure:  Increased Lasix to 40 mg BID for 4 days- now back to the usual 20-40 mg daily dose.  Tried to get her weight from 106 to 100 lbs.  Weight now has only decreased to 104 pounds. We discussed elective admission to the hospital for diuresis and she is now agreeable.   2. Trigger for CHF was likely going out of rhythm.  SHe was found to be out of rhythm yesterday with her PMD.  We discussed starting Amiodarone to maintain NSR since she seems to have CHF with the AFib.  She went back to the higher Metoprolol dose in an attempt to avoid amiodarone, but continues to be in  atrial fibrillation.  She declined anticoagulation with Eliquis in the past.  Given her comorbidities, she and her daughter are hesitant to try this.   She is willing to take IV heparin in the hospital.  Given the LFT abnormalities and a history of lung disease, amiodarone may not be the best choice. Would consider sotalol since she will be in the hospital. 3. Of note, patient had a LINQ monitor placed in [place] a few years ago.At last visit, it showed mostly AFib in the past few months.  She had some asymptomatic 3-4 second pauses.  She will have f/u arranged with EP MD.   No syncope or lightheadedness.  4.  LFTs mildly elevated, thought to be form passive congestion.  She now has some jaundice on exam. Will check LFTs and she may need a GI evaluation. 5.  CODE STATUS discussed with the patient and her daughter who is also a physician. The patient does not want to be intubated. She would accept any other type of resuscitation. The limited resuscitation orders were placed. 6.  She does not want her supplements restarted while she is in the hospital. She will only take her prescription medicines.  Signed, Fredric Mare, MD, [facility] 01/18/2015 4:20 PM

## 2015-01-19 DIAGNOSIS — Z8673 Personal history of transient ischemic attack (TIA), and cerebral infarction without residual deficits: Secondary | ICD-10-CM | POA: Diagnosis not present

## 2015-01-19 DIAGNOSIS — Z881 Allergy status to other antibiotic agents status: Secondary | ICD-10-CM | POA: Diagnosis not present

## 2015-01-19 DIAGNOSIS — Z6822 Body mass index (BMI) 22.0-22.9, adult: Secondary | ICD-10-CM | POA: Diagnosis not present

## 2015-01-19 DIAGNOSIS — R0602 Shortness of breath: Secondary | ICD-10-CM | POA: Diagnosis present

## 2015-01-19 DIAGNOSIS — Z888 Allergy status to other drugs, medicaments and biological substances status: Secondary | ICD-10-CM | POA: Diagnosis not present

## 2015-01-19 DIAGNOSIS — E440 Moderate protein-calorie malnutrition: Secondary | ICD-10-CM | POA: Diagnosis present

## 2015-01-19 DIAGNOSIS — Z8701 Personal history of pneumonia (recurrent): Secondary | ICD-10-CM | POA: Diagnosis not present

## 2015-01-19 DIAGNOSIS — Z885 Allergy status to narcotic agent status: Secondary | ICD-10-CM | POA: Diagnosis not present

## 2015-01-19 DIAGNOSIS — I5033 Acute on chronic diastolic (congestive) heart failure: Secondary | ICD-10-CM | POA: Diagnosis present

## 2015-01-19 DIAGNOSIS — I480 Paroxysmal atrial fibrillation: Secondary | ICD-10-CM | POA: Diagnosis present

## 2015-01-19 DIAGNOSIS — I4891 Unspecified atrial fibrillation: Secondary | ICD-10-CM

## 2015-01-19 DIAGNOSIS — M419 Scoliosis, unspecified: Secondary | ICD-10-CM | POA: Diagnosis present

## 2015-01-19 DIAGNOSIS — E43 Unspecified severe protein-calorie malnutrition: Secondary | ICD-10-CM

## 2015-01-19 LAB — BASIC METABOLIC PANEL
Anion gap: 6 (ref 5–15)
BUN: 20 mg/dL (ref 6–23)
CALCIUM: 8.3 mg/dL — AB (ref 8.4–10.5)
CO2: 31 mmol/L (ref 19–32)
CREATININE: 0.65 mg/dL (ref 0.50–1.10)
Chloride: 99 mmol/L (ref 96–112)
GFR calc Af Amer: 85 mL/min — ABNORMAL LOW (ref >90)
GFR, EST NON AFRICAN AMERICAN: 74 mL/min — AB (ref >90)
GLUCOSE: 97 mg/dL (ref 70–99)
Potassium: 4.1 mmol/L (ref 3.5–5.1)
Sodium: 136 mmol/L (ref 135–145)

## 2015-01-19 LAB — CBC
HCT: 35 % — ABNORMAL LOW (ref 36.0–46.0)
Hemoglobin: 12 g/dL (ref 12.0–15.0)
MCH: 31.4 pg (ref 26.0–34.0)
MCHC: 34.3 g/dL (ref 30.0–36.0)
MCV: 91.6 fL (ref 78.0–100.0)
PLATELETS: 202 10*3/uL (ref 150–400)
RBC: 3.82 MIL/uL — ABNORMAL LOW (ref 3.87–5.11)
RDW: 14.8 % (ref 11.5–15.5)
WBC: 8 10*3/uL (ref 4.0–10.5)

## 2015-01-19 LAB — GLUCOSE, CAPILLARY
GLUCOSE-CAPILLARY: 87 mg/dL (ref 70–99)
GLUCOSE-CAPILLARY: 96 mg/dL (ref 70–99)
Glucose-Capillary: 134 mg/dL — ABNORMAL HIGH (ref 70–99)

## 2015-01-19 LAB — HEPARIN LEVEL (UNFRACTIONATED)
HEPARIN UNFRACTIONATED: 0.32 [IU]/mL (ref 0.30–0.70)
Heparin Unfractionated: 0.51 IU/mL (ref 0.30–0.70)

## 2015-01-19 MED ORDER — ENSURE COMPLETE PO LIQD
237.0000 mL | ORAL | Status: DC
  Administered 2015-01-19 – 2015-01-20 (×2): 237 mL via ORAL

## 2015-01-19 NOTE — Clinical Social Work Psychosocial (Signed)
     Clinical Social Work Department BRIEF PSYCHOSOCIAL ASSESSMENT 01/19/2015  Patient:  Melanie Obrien,Melanie H     Account Number:  [id number]     Admit date:  01/18/2015  Clinical Social Worker:  Obrien,SAMANTHA, CLINICAL SOCIAL WORKER  Date/Time:  01/19/2015 03:54 PM  Referred by:  Physician  Date Referred:  01/19/2015 Referred for  Psychosocial assessment   Other Referral:   Interview type:  Patient Other interview type:    PSYCHOSOCIAL DATA Living Status:  FACILITY Admitted from facility:  [facility] Level of care:  Independent Living Primary support name:  Melanie Obrien Primary support relationship to patient:  CHILD, ADULT Degree of support available:   The pt states that her daughter and family are very supportive.   home- [phone]   work- [phone]   cell- [phone]         CURRENT CONCERNS Current Concerns  Other - See comment   Other Concerns:   Return to Independent Living vs increased level of care if recommended by PT    SOCIAL WORK ASSESSMENT / PLAN SW talked with pt regarding [facility] ready for discharge. Before  hospital stay [facility]. Pt is agreeable and [facility]. Physical Therapy will evaluate her to determine if she needs a higher level of careat d/c. [facility] and they are willing to accommodate her needs when she returns if she needs a higher level of care. Fl2 initiated and placed on chart for MD's signature.   Assessment/plan status:  Psychosocial Support/Ongoing Assessment of Needs Other assessment/ plan:   SW called daughter and left a voicemail for possible needs. SW will update FL2 when physical therapy evaluates PT.   Information/referral to community resources:   None at this time. Patient defers SNF list as she wants to [facility] if indicated.    PATIENTS/FAMILYS RESPONSE TO  PLAN OF CARE: Pt states her family is very supportive. Pt is very nice and pleasant. She states that [facility] and that she is happy that they can accommodate her needs if needed. A message has been left with the family re: d/c needs/options.  Melanie Obrien, BSW Student Intern    01/19/15  I have read and reviewed Intern's assessment and agree to it's contents.  Melanie Frederickonna T. Darrian Goodwill, LCSW, [phone]

## 2015-01-19 NOTE — Progress Notes (Signed)
INITIAL NUTRITION ASSESSMENT  DOCUMENTATION CODES Per approved criteria  -Not Applicable   INTERVENTION: Provide Ensure Complete once daily, provides 350 kcal and 13 grams of protein RD to continue to monitor PO intake for adequacy  NUTRITION DIAGNOSIS: Predicted sub optimal energy inatake related to advanced age and exacerbation of CHF as evidenced by pt's chart.   Goal: Pt to meet >/= 90% of their estimated nutrition needs   Monitor:  PO intake, weight trend, labs, I/O's  Reason for Assessment: Consult; concern for protein calorie malnutrition  81 y.o. female  Admitting Dx: Acute on chronic diastolic heart failure  ASSESSMENT: 81 y.o. female who has had PAF and diastolic heart failure. SHe was admitted and diuresed in the hospital in Dec 2015. She was doing well after leaving the hospital. Over the last several weeks, she has noticed some increased lower extremity swelling. Her weight has also increased.   Pt asleep at time of visit. She appears thin with moderate fat wasting of orbital region and moderate muscle wasting of temporal region. Lunch tray at bedside with 95% consumed- chicken, potatoes, asparagus and green beans. Per nursing notes. Pt ate 50% of breakfast this morning. No evidence of weight loss per weight history below. Unable to determine if patient meets nutrition-related criteria for malnutrition at this time; will follow-up at later date when pt is awake.   Labs reviewed.   Height: Ht Readings from Last 1 Encounters:  01/18/15 4\' 7"  (1.397 m)    Weight: Wt Readings from Last 1 Encounters:  01/19/15 98 lb 14.4 oz (44.861 kg)    Ideal Body Weight: 92 lbs  % Ideal Body Weight: 106%  Wt Readings from Last 10 Encounters:  01/19/15 98 lb 14.4 oz (44.861 kg)  01/18/15 104 lb 9.6 oz (47.446 kg)  01/13/15 106 lb (48.081 kg)  12/30/14 100 lb 1.9 oz (45.414 kg)  12/16/14 89 lb 8 oz (40.597 kg)    Usual Body Weight: ~100 lbs per weight history  % Usual  Body Weight: 98%  BMI:  Body mass index is 22.99 kg/(m^2).  Estimated Nutritional Needs: Kcal: 1150-1300 Protein: 55-65 grams Fluid: 1.3 L/day  Skin: +2 RLE and LLE edema  Diet Order: Diet 2 gram sodium; Gluten Free  EDUCATION NEEDS: -No education needs identified at this time   Intake/Output Summary (Last 24 hours) at 01/19/15 1539 Last data filed at 01/19/15 1300  Gross per 24 hour  Intake 879.41 ml  Output   3421 ml  Net -2541.59 ml    Last BM: 1/25  Labs:   Recent Labs Lab 01/18/15 1715 01/19/15 0058  NA 134* 136  K 4.3 4.1  CL 100 99  CO2 27 31  BUN 20 20  CREATININE 0.60 0.65  CALCIUM 8.2* 8.3*  GLUCOSE 93 97    CBG (last 3)   Recent Labs  01/18/15 2152 01/19/15 0623 01/19/15 1057  GLUCAP 142* 87 96    Scheduled Meds: . clopidogrel  75 mg Oral Daily  . furosemide  40 mg Intravenous BID  . loratadine  10 mg Oral Daily  . metoprolol succinate  100 mg Oral Daily  . pregabalin  75 mg Oral BID  . sodium chloride  3 mL Intravenous Q12H    Continuous Infusions: . heparin 650 Units/hr (01/18/15 1847)    Past Medical History  Diagnosis Date  . Sinus tachycardia   . Pneumonia   . Scoliosis   . TIA (transient ischemic attack)   . Trigeminal neuralgia of left  side of face 12/13/2014  . Hot flashes 12/13/2014  . Acute on chronic diastolic heart failure   . Atrial fibrillation   . Lower extremity edema   . CHF (congestive heart failure)   . Shortness of breath dyspnea     Past Surgical History  Procedure Laterality Date  . Tonsillectomy      Ian Malkin RD, LDN Inpatient Clinical Dietitian Pager: [phone] After Hours Pager: [phone]

## 2015-01-19 NOTE — Progress Notes (Signed)
UR completed 

## 2015-01-19 NOTE — Evaluation (Signed)
Occupational Therapy Evaluation and Discharge Summary Patient Details Name: DEASHA CLENDENIN MRN: [id number] DOB: 29-Sep-1919 Today's Date: 01/19/2015    History of Present Illness Patient is a 81 y/o female with PMH of TIA, trigeminal neuralgia, hot flashes and severe scoliosis who presents with an episode of tightness in the center of her chest associated with some shortness of breath and noted to be in A-fib with RVR. Admitted with CAP. Now returned to NSR.   Clinical Impression   Pt admitted for above diagnosis and overall is doing very well.  Pt remains independent with her basic adls and should be able to return to [facility] when medically stable.  No further OT needs.    Follow Up Recommendations  No OT follow up;Supervision - Intermittent    Equipment Recommendations  None recommended by OT    Recommendations for Other Services       Precautions / Restrictions Precautions Precautions: None Restrictions Weight Bearing Restrictions: No      Mobility Bed Mobility Overal bed mobility: Modified Independent             General bed mobility comments: extra time to get to side of bed but independent  Transfers Overall transfer level: Modified independent Equipment used: None             General transfer comment: safe and independent with transfers.    Balance Overall balance assessment: No apparent balance deficits (not formally assessed)                                          ADL Overall ADL's : At baseline                                       General ADL Comments: Pt does very well with adls. Pt walked hallways, groomed, toileted and donned socks all with no  assist.     Vision                     Perception Perception Perception Tested?: No   Praxis Praxis Praxis tested?: Within functional limits    Pertinent Vitals/Pain Pain Assessment: No/denies pain     Hand Dominance Right    Extremity/Trunk Assessment Upper Extremity Assessment Upper Extremity Assessment: Overall WFL for tasks assessed (mild ongoing "catch" in R shoulder)   Lower Extremity Assessment Lower Extremity Assessment: Defer to PT evaluation   Cervical / Trunk Assessment Cervical / Trunk Assessment: Kyphotic (pt with h/o scoliosis)   Communication Communication Communication: HOH   Cognition Arousal/Alertness: Awake/alert Behavior During Therapy: WFL for tasks assessed/performed Overall Cognitive Status: Within Functional Limits for tasks assessed                     General Comments       Exercises       Shoulder Instructions      Home Living Family/patient expects to be discharged to:: Private residence Living Arrangements: Alone;Other (Comment) ([facility]) Available Help at Discharge: Available PRN/intermittently Type of Home: Apartment Home Access: Level entry     Home Layout: One level     Bathroom Shower/Tub: Tub/shower unit;Curtain Shower/tub characteristics: Engineer, building services: Standard Bathroom Accessibility: Yes How Accessible: Accessible via walker Home Equipment: Walker - 4 wheels  Prior Functioning/Environment Level of Independence: Independent with assistive device(s)        Comments: Independent ambulator within home and uses rollator for hallway/community ambulation.    OT Diagnosis:     OT Problem List:     OT Treatment/Interventions:      OT Goals(Current goals can be found in the care plan section) Acute Rehab OT Goals Patient Stated Goal: to get back to [facility] OT Goal Formulation: All assessment and education complete, DC therapy  OT Frequency:     Barriers to D/C:            Co-evaluation              End of Session Nurse Communication: Mobility status  Activity Tolerance: Patient tolerated treatment well Patient left: in bed;with call bell/phone within reach   Time:  1155-1225 OT Time Calculation (min): 30 min Charges:  OT General Charges $OT Visit: 1 Procedure OT Evaluation $Initial OT Evaluation Tier I: 1 Procedure OT Treatments $Self Care/Home Management : 8-22 mins G-Codes: OT G-codes **NOT FOR INPATIENT CLASS** Functional Assessment Tool Used: clinical judgement Functional Limitation: Self care Self Care Current Status (A5409(G8987): At least 1 percent but less than 20 percent impaired, limited or restricted Self Care Goal Status (W1191(G8988): At least 1 percent but less than 20 percent impaired, limited or restricted Self Care Discharge Status [phone](G8989): At least 1 percent but less than 20 percent impaired, limited or restricted  Hope BuddsJones, Kyian Obst Anne 01/19/2015, 12:34 PM  [phone]

## 2015-01-19 NOTE — Progress Notes (Addendum)
ANTICOAGULATION CONSULT NOTE  Pharmacy Consult for Heparin Indication: atrial fibrillation  Allergies  Allergen Reactions  . Codeine Other (See Comments)    unknown  . Levaquin [Levofloxacin In D5w] Itching  . Xylocaine [Lidocaine Hcl] Other (See Comments)    Uncontrolled shaking    Patient Measurements: Height: 4\' 7"  (139.7 cm) Weight: 101 lb 12.8 oz (46.176 kg) IBW/kg (Calculated) : 34 Heparin Dosing Weight: 43.5 kg  Vital Signs: Temp: 97.8 F (36.6 C) (01/25 2047) Temp Source: Oral (01/25 2047) BP: 136/71 mmHg (01/25 2047) Pulse Rate: 97 (01/25 2047)  Labs:  Recent Labs  01/18/15 1715 01/19/15 0058  HGB 11.2* 12.0  HCT 32.6* 35.0*  PLT 184 202  HEPARINUNFRC  --  0.32  CREATININE 0.60 0.65    Estimated Creatinine Clearance: 26.4 mL/min (by C-G formula based on Cr of 0.65).  Scheduled:  . clopidogrel  75 mg Oral Daily  . furosemide  40 mg Intravenous BID  . loratadine  10 mg Oral Daily  . metoprolol succinate  100 mg Oral Daily  . pregabalin  75 mg Oral BID  . sodium chloride  3 mL Intravenous Q12H    Assessment: 81 yo female with Afib for heparin  Goal of Therapy:  Heparin level 0.3-0.7 units/ml Monitor platelets by anticoagulation protocol: Yes   Plan:  Continue Heparin at current rate  Recheck level in am to verify  Geannie RisenGreg Abbott, PharmD, BCPS   ADDEN -AM heparin level therapeutic -h/h + plts 12/35, 200 -f/u plans for PO anticoagulation -monitor for s/sx bleeding   Agapito GamesAlison Jhamari Markowicz, PharmD, BCPS Clinical Pharmacist Pager: [phone] 01/19/2015 10:02 AM

## 2015-01-19 NOTE — Progress Notes (Signed)
Melanie Obrien is a 81 y.o. female who has had PAF and diastolic heart failure. SHe was admitted and diuresed in the hospital in Dec 2015. She was doing well after leaving the hospital. Over the last several weeks, she has noticed some increased lower extremity swelling. Her weight has also increased. She feels that her face is puffier than usual. She is a little bit more short of breath than usual. She just does not feel herself. She cannot feel any palpitations.  She had one visit with our PA after being in the hospital. She was in normal rhythm at that time. At her primary care doctor's office yesterday, she was found to be back in atrial fibrillation. Her daughter is a primary care doctor. I spoke with her about potentially starting amiodarone to maintain sinus rhythm. There were somewhat hesitant. The patient's lung capacity is also limited by scoliosis.  LFTs mildly elevated, thought to be form passive congestion.  She returns today 5 days later for routine follow-up. She continues to have lower extremity edema. She had no significant diuresis. Her breathing is still somewhat impaired. She is now more willing to admitted to the hospital for elective IV diuresis.  Primary Cardiologist: Eldridge Dace  Subjective:  Feels much better this AM after initial diuresis.  Less SOB & edema improved.  Objective:  Vital Signs in the last 24 hours: Temp:  [97.6 F (36.4 C)-98.3 F (36.8 C)] 97.9 F (36.6 C) (01/26 0548) Pulse Rate:  [93-110] 95 (01/26 0548) Resp:  [18-20] 18 (01/26 0548) BP: (110-136)/(65-81) 123/65 mmHg (01/26 0548) SpO2:  [95 %-99 %] 98 % (01/26 0548) FiO2 (%):  [21 %] 21 % (01/25 1633) Weight:  [98 lb 14.4 oz (44.861 kg)-104 lb 9.6 oz (47.446 kg)] 98 lb 14.4 oz (44.861 kg) (01/26 0555)  Intake/Output from previous day: 01/25 0701 - 01/26 0700 In: 219.4 [I.V.:219.4] Out: 1870 [Urine:1870] Intake/Output from this shift: Total I/O In: 120 [P.O.:120] Out: 1 [Stool:1]  Wt  Readings from Last 3 Encounters:  01/19/15 98 lb 14.4 oz (44.861 kg)  01/18/15 104 lb 9.6 oz (47.446 kg)  01/13/15 106 lb (48.081 kg)   Physical Exam: General appearance: alert, cooperative, appears stated age, no distress and thin & frail appearing. Neck: no adenopathy, no carotid bruit and minimal JVD but + HJR Lungs: Normal effort, trace bibasal rales, good air movement. Heart: irregularly irregular rhythm and normal S1&S2, No obvious  M/R/G Abdomen: soft, non-tender; bowel sounds normal; no masses,  no organomegaly and mildly protuberant with probable trace ascites Extremities: edema ~1-2+ and no ulcers, gangrene or trophic changes Pulses: 2+ and symmetric Skin: mild erythematous macular rash on RLE, diffuse small petiechiae Neurologic: Grossly normal  Lab Results:  Recent Labs  01/18/15 1715 01/19/15 0058  WBC 7.2 8.0  HGB 11.2* 12.0  PLT 184 202    Recent Labs  01/18/15 1715 01/19/15 0058  NA 134* 136  K 4.3 4.1  CL 100 99  CO2 27 31  GLUCOSE 93 97  BUN 20 20  CREATININE 0.60 0.65   No results for input(s): TROPONINI in the last 72 hours.  Invalid input(s): CK, MB Hepatic Function Panel  Recent Labs  01/18/15 1715  PROT 5.2*  ALBUMIN 2.9*  AST 55*  ALT 65*  ALKPHOS 106  BILITOT 0.6   No results for input(s): CHOL in the last 72 hours. No results for input(s): PROTIME in the last 72 hours.  Imaging: No CXR  Cardiac Studies: Tele - Afib  90s-110s  Echo from 12/14/2014: EF 55-60%, no regional WMA, Severe LA dilation (makes Afib more difficult to cardiovert & maintain NSR), mild MR, moderately increase PAP ~46 mmHg  Assessment/Plan:  Principal Problem:   Acute on chronic diastolic heart failure Active Problems:   Atrial fibrillation with RVR   SOB (shortness of breath)   Lower extremity edema   Paroxysmal atrial fibrillation   Protein Malnutrition --  1. Acute on chronic diastolic heart failure: Unsuccessful attempt at reducing home dry wgt  with increased PO Lasix of 40 mg BID for 4 days. Tried to get her weight from 106 to 100 lbs. Weight now has only decreased to 104 pounds. Admitted for IV Diuresis.  Current inpatient wgt is 98 lb (probably different scales).  Suspect that recurrence of Afib with ~RVR has triggered current exacerbation.  Continue IV Lasix for today & expect potentially converting to po tomorrow - 40 mg daily  With current BP - would not use afterload reduction given recent increase in BB dose for Rate control  PRN Diltiazem for additional rate control if BP tolerates  2. Trigger for CHF was likely going back to Afib. on 1/24.  Decision was to avoid Amiodarone. Declined long term Anticoagulartion (AC)>  On IV Heparin for now - if Afib proves difficult to control, would potentially consider TEE-DCCV & ~1 month of full AC.  BB dose was increased to 100 mg bid on admission - would like to see how she does with increase rate control before considering antiarrhythmic.    Would consider sotalol since she will be in the hospital.  On LINQ monitor - ASx 3-4 Second pauses noted - keep on tele, will need @ least OP EP if not inpt input.    LFTs mildly elevated, thought to be form passive congestion. She now has some jaundice on exam. Will check LFTs and she may need a GI evaluation.  Low serum protein - suspect some component of malnutrition --  Nutrition consult requested to help assess nutritional needs, I suspect that at least part of her edema is related to low oncotic pressure vs. All CHF related.   CODE STATUS: Dr. Eldridge DaceVaranasi discussed with the patient and her daughter who is also a physician. The patient does not want to be intubated. She would accept any other type of resuscitation. The limited resuscitation orders were placed.    LOS: 1 day    HARDING, DAVID W 01/19/2015, 10:12 AM

## 2015-01-20 DIAGNOSIS — K769 Liver disease, unspecified: Secondary | ICD-10-CM

## 2015-01-20 LAB — COMPREHENSIVE METABOLIC PANEL
ALBUMIN: 2.7 g/dL — AB (ref 3.5–5.2)
ALT: 47 U/L — ABNORMAL HIGH (ref 0–35)
AST: 38 U/L — AB (ref 0–37)
Alkaline Phosphatase: 94 U/L (ref 39–117)
Anion gap: 9 (ref 5–15)
BILIRUBIN TOTAL: 0.7 mg/dL (ref 0.3–1.2)
BUN: 19 mg/dL (ref 6–23)
CO2: 28 mmol/L (ref 19–32)
CREATININE: 0.74 mg/dL (ref 0.50–1.10)
Calcium: 8.2 mg/dL — ABNORMAL LOW (ref 8.4–10.5)
Chloride: 98 mmol/L (ref 96–112)
GFR calc Af Amer: 82 mL/min — ABNORMAL LOW (ref >90)
GFR, EST NON AFRICAN AMERICAN: 71 mL/min — AB (ref >90)
Glucose, Bld: 88 mg/dL (ref 70–99)
POTASSIUM: 3.8 mmol/L (ref 3.5–5.1)
Sodium: 135 mmol/L (ref 135–145)
Total Protein: 5.1 g/dL — ABNORMAL LOW (ref 6.0–8.3)

## 2015-01-20 LAB — GLUCOSE, CAPILLARY
GLUCOSE-CAPILLARY: 58 mg/dL — AB (ref 70–99)
Glucose-Capillary: 129 mg/dL — ABNORMAL HIGH (ref 70–99)
Glucose-Capillary: 98 mg/dL (ref 70–99)
Glucose-Capillary: 143 mg/dL — ABNORMAL HIGH (ref 70–99)

## 2015-01-20 LAB — CBC
HCT: 34.8 % — ABNORMAL LOW (ref 36.0–46.0)
Hemoglobin: 12.1 g/dL (ref 12.0–15.0)
MCH: 31.6 pg (ref 26.0–34.0)
MCHC: 34.8 g/dL (ref 30.0–36.0)
MCV: 90.9 fL (ref 78.0–100.0)
PLATELETS: 192 10*3/uL (ref 150–400)
RBC: 3.83 MIL/uL — ABNORMAL LOW (ref 3.87–5.11)
RDW: 14.7 % (ref 11.5–15.5)
WBC: 6.9 10*3/uL (ref 4.0–10.5)

## 2015-01-20 LAB — HEPARIN LEVEL (UNFRACTIONATED): HEPARIN UNFRACTIONATED: 0.37 [IU]/mL (ref 0.30–0.70)

## 2015-01-20 MED ORDER — AMIODARONE HCL 200 MG PO TABS
200.0000 mg | ORAL_TABLET | Freq: Three times a day (TID) | ORAL | Status: DC
  Administered 2015-01-20 – 2015-01-23 (×8): 200 mg via ORAL
  Filled 2015-01-20 (×11): qty 1

## 2015-01-20 MED ORDER — FUROSEMIDE 10 MG/ML IJ SOLN
40.0000 mg | Freq: Every day | INTRAMUSCULAR | Status: DC
  Administered 2015-01-20: 40 mg via INTRAVENOUS

## 2015-01-20 MED ORDER — AMIODARONE HCL 200 MG PO TABS: 200.0000 mg | ORAL_TABLET | Freq: Every day | ORAL | Status: DC

## 2015-01-20 MED ORDER — METOPROLOL SUCCINATE ER 50 MG PO TB24
50.0000 mg | ORAL_TABLET | Freq: Every day | ORAL | Status: DC
  Administered 2015-01-21 – 2015-01-23 (×3): 50 mg via ORAL
  Filled 2015-01-20 (×4): qty 1

## 2015-01-20 MED ORDER — AMIODARONE HCL 200 MG PO TABS
200.0000 mg | ORAL_TABLET | Freq: Two times a day (BID) | ORAL | Status: DC
  Filled 2015-01-20: qty 1

## 2015-01-20 MED ORDER — FUROSEMIDE 40 MG PO TABS
40.0000 mg | ORAL_TABLET | Freq: Every day | ORAL | Status: DC
  Filled 2015-01-20 (×2): qty 1

## 2015-01-20 MED ORDER — FUROSEMIDE 40 MG PO TABS
40.0000 mg | ORAL_TABLET | Freq: Once | ORAL | Status: AC
  Administered 2015-01-20: 40 mg via ORAL
  Filled 2015-01-20: qty 1

## 2015-01-20 NOTE — Evaluation (Signed)
Physical Therapy Evaluation Patient Details Name: Melanie Obrien MRN: [id number] DOB: 05-Mar-1919 Today's Date: 01/20/2015   History of Present Illness  Patient is a 81 y/o female with PMH of TIA, trigeminal neuralgia, hot flashes and severe scoliosis who presents with an episode of tightness in the center of her chest associated with some shortness of breath and noted to be in A-fib with RVR. Admitted with CAP. Now returned to [facility].  Clinical Impression  Pt was seen for evaluation of her physical function and demonstrates some unsteadiness with transfers and gait.  Her plan was to go directly to her apt but talked with her about a very short stay in SNF there.  Pt is reluctant not to just go back to apt and will assess her as she continues in the hospital to see if she progresses beyond this level.    Follow Up Recommendations SNF    Equipment Recommendations  None recommended by PT    Recommendations for Other Services       Precautions / Restrictions Precautions Precautions: None Restrictions Weight Bearing Restrictions: No      Mobility  Bed Mobility Overal bed mobility: Modified Independent             General bed mobility comments: extra time to get to side of bed but independent  Transfers Overall transfer level: Modified independent Equipment used: Rolling walker (2 wheeled)             General transfer comment: impulsive with standing effort  Ambulation/Gait Ambulation/Gait assistance: Min assist Ambulation Distance (Feet): 100 Feet Assistive device: Rolling walker (2 wheeled) Gait Pattern/deviations: Step-through pattern;Decreased step length - right;Decreased step length - left;Decreased stride length;Shuffle;Trunk flexed;Wide base of support (agitated about using the walker instead of rollator) Gait velocity: reduced Gait velocity interpretation: Below normal speed for age/gender (difficult to fully assess as pt stops often to complain abou) General  Gait Details: difficult to fully assess as pt stops often to complain about using RW and the telemetry unit being in her pocket  Stairs            Wheelchair Mobility    Modified Rankin (Stroke Patients Only)       Balance Overall balance assessment: Needs assistance           Standing balance-Leahy Scale: Fair (dynamic fair-) Standing balance comment: leans over excessively without an AD                             Pertinent Vitals/Pain Pain Assessment: No/denies pain    Home Living Family/patient expects to be discharged to:: Private residence Living Arrangements: Alone;Other (Comment) Available Help at Discharge: Available PRN/intermittently Type of Home: Apartment Home Access: Level entry     Home Layout: One level Home Equipment: Walker - 4 wheels Additional Comments: Pt is defensive about PT [facility]    Prior Function Level of Independence: Independent with assistive device(s)         Comments: Independent ambulator within home and uses rollator for hallway/community ambulation.     Hand Dominance   Dominant Hand: Right    Extremity/Trunk Assessment   Upper Extremity Assessment: Overall WFL for tasks assessed           Lower Extremity Assessment: Overall WFL for tasks assessed      Cervical / Trunk Assessment: Kyphotic;Other exceptions (compression fracture in the last year)  Communication   [facility]  Cognition Arousal/Alertness: Awake/alert Behavior During Therapy: WFL for tasks assessed/performed Overall Cognitive Status: Within Functional Limits for tasks assessed                      General Comments General comments (skin integrity, edema, etc.): LE edema is fairly mild in LE's and with pt now presenting with postural issues, some may have been pre-existing but pt adamant that she is no different too.  If this is true, she would benefit from a short stay in SNF part  of [facility].  Her likelihood of agreeing is moderately positive.    Exercises        Assessment/Plan    PT Assessment Patient needs continued PT services  PT Diagnosis Difficulty walking   PT Problem List Decreased strength;Decreased range of motion;Decreased activity tolerance;Decreased balance;Decreased mobility;Decreased coordination;Decreased knowledge of use of DME;Decreased safety awareness;Cardiopulmonary status limiting activity  PT Treatment Interventions DME instruction;Gait training;Functional mobility training;Therapeutic activities;Therapeutic exercise;Balance training;Neuromuscular re-education;Cognitive remediation;Patient/family education   PT Goals (Current goals can be found in the Care Plan section) Acute Rehab PT Goals Patient Stated Goal: to get back to [facility] PT Goal Formulation: With patient Time For Goal Achievement: 01/30/15 Potential to Achieve Goals: Good    Frequency Min 2X/week   Barriers to discharge Decreased caregiver support      Co-evaluation               End of Session Equipment Utilized During Treatment: Other (comment) (FWW) Activity Tolerance:  (Bathed alone, reports she had to and then chose to but tired) Patient left: in bed;with call bell/phone within reach Nurse Communication: Mobility status         Time: 1610-9604 PT Time Calculation (min) (ACUTE ONLY): 23 min   Charges:   PT Evaluation $Initial PT Evaluation Tier I: 1 Procedure PT Treatments $Gait Training: 8-22 mins   PT G CodesIvar Drape 02-11-2015, 11:44 AM  Samul Dada, PT MS Acute Rehab Dept. Number: [phone]

## 2015-01-20 NOTE — Progress Notes (Addendum)
Melanie Obrien is a 81 y.o. female who has had PAF and diastolic heart failure. SHe was admitted and diuresed in the hospital in Dec 2015. She was doing well after leaving the hospital. Over the last several weeks, she has noticed some increased lower extremity swelling. Her weight has also increased. She feels that her face is puffier than usual. She is a little bit more short of breath than usual. She just does not feel herself. She cannot feel any palpitations.  She had one visit with our PA after being in the hospital. She was in normal rhythm at that time. At her primary care doctor's office yesterday, she was found to be back in atrial fibrillation. Her daughter is a primary care doctor. I spoke with her about potentially starting amiodarone to maintain sinus rhythm. There were somewhat hesitant. The patient's lung capacity is also limited by scoliosis.  LFTs mildly elevated, thought to be form passive congestion.  She returns today 5 days later for routine follow-up. She continues to have lower extremity edema. She had no significant diuresis. Her breathing is still somewhat impaired. She is now more willing to admitted to the hospital for elective IV diuresis.  Primary Cardiologist: Eldridge Dace  Subjective:  Feels much better this AM after initial diuresis.  Less SOB & edema improved.  Objective:  Vital Signs in the last 24 hours: Temp:  [97.6 F (36.4 C)-98 F (36.7 C)] 98 F (36.7 C) (01/27 0930) Pulse Rate:  [61-94] 91 (01/27 0930) Resp:  [16-18] 18 (01/27 0930) BP: (112-164)/(49-84) 112/55 mmHg (01/27 0930) SpO2:  [95 %-99 %] 96 % (01/27 0930) Weight:  [95 lb 9.6 oz (43.364 kg)] 95 lb 9.6 oz (43.364 kg) (01/27 0539)  Intake/Output from previous day: 01/26 0701 - 01/27 0700 In: 1088 [P.O.:900; I.V.:188] Out: 3726 [Urine:3725; Stool:1] Intake/Output from this shift: Total I/O In: 340 [P.O.:340] Out: 600 [Urine:600]  Wt Readings from Last 3 Encounters:  01/20/15 95 lb 9.6  oz (43.364 kg)  01/18/15 104 lb 9.6 oz (47.446 kg)  01/13/15 106 lb (48.081 kg)   Physical Exam: General appearance: alert, cooperative, appears stated age, no distress and thin & frail appearing. Neck: no adenopathy, no carotid bruit and minimal JVD but + HJR Lungs: Normal effort, trace bibasal rales, good air movement. Heart: irregularly irregular rhythm and normal S1&S2, No obvious  M/R/G Abdomen: soft, non-tender; bowel sounds normal; no masses,  no organomegaly and mildly protuberant with probable trace ascites Extremities: edema ~1-2+ and no ulcers, gangrene or trophic changes Pulses: 2+ and symmetric Skin: mild erythematous macular rash on RLE, diffuse small petiechiae Neurologic: Grossly normal  Lab Results:  Recent Labs  01/19/15 0058 01/20/15 0354  WBC 8.0 6.9  HGB 12.0 12.1  PLT 202 192    Recent Labs  01/19/15 0058 01/20/15 0354  NA 136 135  K 4.1 3.8  CL 99 98  CO2 31 28  GLUCOSE 97 88  BUN 20 19  CREATININE 0.65 0.74   No results for input(s): TROPONINI in the last 72 hours.  Invalid input(s): CK, MB Hepatic Function Panel  Recent Labs  01/20/15 0354  PROT 5.1*  ALBUMIN 2.7*  AST 38*  ALT 47*  ALKPHOS 94  BILITOT 0.7   No results for input(s): CHOL in the last 72 hours. No results for input(s): PROTIME in the last 72 hours.  Imaging: No CXR  Cardiac Studies: Tele - Afib 60-90s  Echo from 12/14/2014: EF 55-60%, no regional WMA, Severe LA  dilation (makes Afib more difficult to cardiovert & maintain NSR), mild MR, moderately increase PAP ~46 mmHg  EKG - Afib - 81 bpm, LAFB (-52)  Wt Readings from Last 3 Encounters:  01/20/15 95 lb 9.6 oz (43.364 kg)  01/18/15 104 lb 9.6 oz (47.446 kg)  01/13/15 106 lb (48.081 kg)    Assessment/Plan:  Principal Problem:   Acute on chronic diastolic heart failure Active Problems:   Atrial fibrillation with RVR   SOB (shortness of breath)   Lower extremity edema   Paroxysmal atrial fibrillation    Hepatopathy - congestive   Protein Malnutrition?--  1. Acute on chronic diastolic heart failure: Unsuccessful attempt at reducing home dry wgt with increased PO Lasix of 40 mg BID for 4 days. Tried to get her weight from 106 to 100 lbs. Weight now has only decreased to 104 pounds. Admitted for IV Diuresis.  Current inpatient wgt is 95 lb (probably different scales).  Suspect that recurrence of Afib with ~RVR has triggered current exacerbation.  Net UOP -4.35 L  IV Lasix this AM & convert to PO this PM - 40 mg daily  With current BP - would not use afterload reduction given recent increase in BB dose for Rate control  PRN Diltiazem for additional rate control if BP tolerates  2. Trigger for CHF was likely going back to Afib. on 1/24.  Decision was to avoid Amiodarone. Declined long term Anticoagulartion (AC)>  On IV Heparin for now - if Afib proves difficult to control, would potentially consider TEE-DCCV & ~1 month of full AC.  BB dose was increased to 100 mg bid on admission - rate seems overall improved -- would not want to increase further due to concern for bradycardia.  Would consider sotalol since she will be in the hospital. -- discuss with EP (I am not certain that Sotalol is a good option)  On LINQ monitor - ASx 3-4 Second pauses noted - keep on tele, will need @ least OP EP if not inpt input.    LFTs mildly elevated, thought to be form passive congestion -- levels have dropped appropriately.   Low serum protein - suspect some component of malnutrition --  Nutrition consult requested to help assess nutritional needs, I suspect that at least part of her edema is related to low oncotic pressure vs. All CHF related.  Ensure ordered Appreciate Dietician input: suspect sub-optimal energy intake    CODE STATUS: Dr. Eldridge Dace discussed with the patient and her daughter who is also a physician. The patient does not want to be intubated. She would accept any other type of  resuscitation. The limited resuscitation orders were placed.    LOS: 2 days    Marwa Fuhrman W 01/20/2015, 12:02 PM  I discussed the patient's care with Dr. Eldridge Dace and her daughter who is also a physician. I also discussed her case with Dr. Sharrell Ku from electrophysiology. He indicated that he would not recommend sotalol in someone her age. He felt the safest option would be to consider amiodarone. The concern of amiodarone for long-term toxicity, and after discussion with the patient's daughter who then discussed with the patient, we felt that we'll give her quality of his posterior quality of life. We'll therefore reduce metoprolol to 50 mg daily and start amiodarone loading with 200 mg 3 times a day for 3 days followed by twice a day for 3 days followed by once daily afterwards. Provided she is still on the 3 times a day dosing  I would continue anticoagulation, would not necessarily anticoagulated beyond Plavix on discharge.  Marykay LexHARDING, Tonjua Rossetti W, MD

## 2015-01-20 NOTE — Progress Notes (Signed)
ANTICOAGULATION CONSULT NOTE  Pharmacy Consult for heparin Indication: atrial fibrillation  Allergies  Allergen Reactions  . Codeine Other (See Comments)    unknown  . Levaquin [Levofloxacin In D5w] Itching  . Xylocaine [Lidocaine Hcl] Other (See Comments)    Uncontrolled shaking    Patient Measurements: Height: 4\' 7"  (139.7 cm) Weight: 95 lb 9.6 oz (43.364 kg) IBW/kg (Calculated) : 34 Heparin Dosing Weight:   Vital Signs: Temp: 97.9 F (36.6 C) (01/27 0539) Temp Source: Oral (01/27 0539) BP: 115/66 mmHg (01/27 0539) Pulse Rate: 61 (01/27 0539)  Labs:  Recent Labs  01/18/15 1715 01/19/15 0058 01/19/15 0731 01/20/15 0354  HGB 11.2* 12.0  --  12.1  HCT 32.6* 35.0*  --  34.8*  PLT 184 202  --  192  HEPARINUNFRC  --  0.32 0.51 0.37  CREATININE 0.60 0.65  --  0.74    Estimated Creatinine Clearance: 25.7 mL/min (by C-G formula based on Cr of 0.74).   Medical History: Past Medical History  Diagnosis Date  . Sinus tachycardia   . Pneumonia   . Scoliosis   . TIA (transient ischemic attack)   . Trigeminal neuralgia of left side of face 12/13/2014  . Hot flashes 12/13/2014  . Acute on chronic diastolic heart failure   . Atrial fibrillation   . Lower extremity edema   . CHF (congestive heart failure)   . Shortness of breath dyspnea     Medications:  See EMR  Assessment: 81 yo F with AFib and dHF - not on anticoagulation PTA.  May require DCCV. Remains on heparin gtt while longterm plan for anticoag is being decided.  HL therapeutic this morning. CBC stable and wnl. No signs of bleeding.  Goal of Therapy:  Heparin level 0.3-0.7 units/ml Monitor platelets by anticoagulation protocol: Yes   Plan:  -Continue heparin at 650 units/hr -Daily HL, CBC -F/u plan for longterm anticoagulation  -Monitor for s/sx bleeding   Agapito GamesAlison Lauriel Helin, PharmD, BCPS Clinical Pharmacist Pager: [phone] 01/20/2015 10:04 AM

## 2015-01-20 NOTE — Plan of Care (Signed)
Problem: Acute Rehab PT Goals(only PT should resolve) Goal: Pt Will Ambulate And upright posture with no LOB or unsteadiness

## 2015-01-21 DIAGNOSIS — E440 Moderate protein-calorie malnutrition: Secondary | ICD-10-CM | POA: Insufficient documentation

## 2015-01-21 LAB — CBC
HCT: 37 % (ref 36.0–46.0)
Hemoglobin: 12.8 g/dL (ref 12.0–15.0)
MCH: 31.5 pg (ref 26.0–34.0)
MCHC: 34.6 g/dL (ref 30.0–36.0)
MCV: 91.1 fL (ref 78.0–100.0)
Platelets: 217 10*3/uL (ref 150–400)
RBC: 4.06 MIL/uL (ref 3.87–5.11)
RDW: 14.6 % (ref 11.5–15.5)
WBC: 7.8 10*3/uL (ref 4.0–10.5)

## 2015-01-21 LAB — HEPARIN LEVEL (UNFRACTIONATED): Heparin Unfractionated: 0.5 IU/mL (ref 0.30–0.70)

## 2015-01-21 LAB — GLUCOSE, CAPILLARY
GLUCOSE-CAPILLARY: 98 mg/dL (ref 70–99)
GLUCOSE-CAPILLARY: 126 mg/dL — AB (ref 70–99)
Glucose-Capillary: 81 mg/dL (ref 70–99)
Glucose-Capillary: 120 mg/dL — ABNORMAL HIGH (ref 70–99)

## 2015-01-21 MED ORDER — FUROSEMIDE 20 MG PO TABS
20.0000 mg | ORAL_TABLET | Freq: Every day | ORAL | Status: DC
  Administered 2015-01-22: 20 mg via ORAL
  Filled 2015-01-21 (×2): qty 1

## 2015-01-21 MED ORDER — ENSURE COMPLETE PO LIQD
237.0000 mL | Freq: Two times a day (BID) | ORAL | Status: DC
  Administered 2015-01-21 – 2015-01-22 (×3): 237 mL via ORAL

## 2015-01-21 NOTE — Progress Notes (Signed)
NUTRITION FOLLOW-UP ASSESSMENT  DOCUMENTATION CODES Per approved criteria  -Non-severe (moderate) malnutrition in the context of chronic illness  Pt meets criteria for MODERATE MALNUTRITION in the context of CHRONIC ILLNESS as evidenced by moderate loss of muscle mass and moderate loss of subcutaneous fat.  INTERVENTION: Increase Ensure Complete to BID, each supplement provides 350 kcal and 13 grams of protein Encourage PO intake  Nutrition Diagnosis: Predicted sub optimal energy inatake related to advanced age and exacerbation of CHF as evidenced by pt's chart; discontinued- PO 100%  New Nutrition Diagnosis:  Increased nutrient needs related to muscle and fat wasting as evidenced by physical exam and estimated energy/protein needs.  Goal: Pt to meet >/= 90% of their estimated nutrition needs   Monitor:  PO intake, weight trend, labs   81 y.o. female  Admitting Dx: Acute on chronic diastolic heart failure  ASSESSMENT: 81 y.o. female who has had PAF and diastolic heart failure. SHe was admitted and diuresed in the hospital in Dec 2015. She was doing well after leaving the hospital. Over the last several weeks, she has noticed some increased lower extremity swelling. Her weight has also increased.  Pt states that her appetite is good and she is eating 100% of her meals. She reports that she usually weighs 93 lbs and eats 3 meals daily. She typically eats 2 vegetables and one protein at each meal, sometimes including a gluten free carbohydrate. She also keeps her refrigerator stocked with Ensure supplements and drinks one on occasion. Per nursing notes pt is eating 80-100% of meals. Pt acknowledges her low body weight and her low muscle mass. RD encouraged pt to continue eating 3 balances meals daily and to drink Ensure 1-2 times daily to promote weight gain and muscle repletion.   Nutrition Focused Physical Exam:  Subcutaneous Fat:  Orbital Region: moderate wasting Upper Arm  Region: moderate wasting Thoracic and Lumbar Region: NA  Muscle:  Temple Region: moderate wasting Clavicle Bone Region: moderate wasting Clavicle and Acromion Bone Region: mild wasting Scapular Bone Region: NA Dorsal Hand: mild to moderate wasting Patellar Region: moderate to severe wasting Anterior Thigh Region: moderate wasting Posterior Calf Region: moderate wasting  Edema: none noted  Height: Ht Readings from Last 1 Encounters:  01/18/15  (1.397 m)    Weight: Wt Readings from Last 1 Encounters:  01/21/15 91 lb 7.9 oz (41.5 kg)   BMI:  Body mass index is 21.26 kg/(m^2).  Re-estimated Nutritional Needs: Kcal: 1300-1500 Protein: 60-70 grams Fluid: 1.3 L/day  Skin: +2 RLE and LLE edema  Diet Order: Diet 2 gram sodium    Intake/Output Summary (Last 24 hours) at 01/21/15 1125 Last data filed at 01/21/15 0911  Gross per 24 hour  Intake   1468 ml  Output   1670 ml  Net   -202 ml    Last BM: 1/26  Labs:   Recent Labs Lab 01/18/15 1715 01/19/15 0058 01/20/15 0354  NA 134* 136 135  K 4.3 4.1 3.8  CL 100 99 98  CO2 BUN CREATININE 0.60 0.65 0.74  CALCIUM 8.2* 8.3* 8.2*  GLUCOSE 93 97 88    CBG (last 3)   Recent Labs  01/20/15 1654 01/20/15 2117 01/21/15 0640  GLUCAP 98 143* 98    Scheduled Meds: . amiodarone  200 mg Oral TID   Followed by  . [START ON 01/23/2015] amiodarone  200 mg Oral BID   Followed by  . [START ON  01/27/2015] amiodarone  200 mg Oral Daily  . clopidogrel  75 mg Oral Daily  . feeding supplement (ENSURE COMPLETE)  237 mL Oral Q24H  . [START ON 01/22/2015] furosemide  20 mg Oral Daily  . loratadine  10 mg Oral Daily  . metoprolol succinate  50 mg Oral Daily  . pregabalin  75 mg Oral BID  . sodium chloride  3 mL Intravenous Q12H    Continuous Infusions: . heparin 650 Units/hr (01/20/15 0300)    Ian Malkineanne Barnett RD, LDN Inpatient Clinical Dietitian Pager: [phone] After Hours Pager: [phone]

## 2015-01-21 NOTE — Care Management Note (Unsigned)
    Page 1 of 1   01/22/2015     4:34:32 PM CARE MANAGEMENT NOTE 01/22/2015  Patient:  Melanie Obrien,Melanie Obrien   Account Number:  [id number]  Date Initiated:  01/21/2015  Documentation initiated by:  Donae Kueker  Subjective/Objective Assessment:   Pt adm on 01/18/15 with CHF, CP.  [facility]  She has supportive daughter in the area.     Action/Plan:   PT recommending SNF, though pt not interested in this.  [facility] though would probably benefit from this.  Will follow up.   Anticipated DC Date:  01/22/2015   Anticipated DC Plan:  HOME W HOME HEALTH SERVICES      DC Planning Services  CM consult      Choice offered to / List presented to:             Status of service:  In process, will continue to follow Medicare Important Message given?  YES (If response is "NO", the following Medicare IM given date fields will be blank) Date Medicare IM given:  01/21/2015 Medicare IM given by:  Birgitta Uhlir Date Additional Medicare IM given:   Additional Medicare IM given by:    Discharge Disposition:    Per UR Regulation:  Reviewed for med. necessity/level of care/duration of stay  If discussed at Long Length of Stay Meetings, dates discussed:    Comments:  01/22/15 Sidney AceJulie Harlee Pursifull, RN, BSN [phone] PT and OT recommending HH follow up; [facility] as well.  [facility], but she is very independent, and defensive about having help.  MD: please order if you agree--would appreciate you [facility] if ordered.

## 2015-01-21 NOTE — Progress Notes (Signed)
CSW met with patient this afternoon to discuss d/c plan/options.  PT recommends short term SNF.  Patient refuses this option and plans to return to her apartment at d/c. She does not feel that she requires Home health services despite CSW's encouragement to do so.  Patient states that her daughter supports her desire to return to her apartment at d/c and wanted to "show" the CSW that she did not need PT; patient got out of bed- pushing her IV pole and ambulated in the room. CSW strongly encouraged her to return to bed stating that CSW is not trained to assist her with ambulation.  Patient related "I know you're not- that's why I wanted to show you that I don't need any help."  CSW will discuss with RNCM in the am but she will be resistant to any type of home care and refuses SNF. CSW will sign off. Lorie Phenix. Pauline Good, [place]

## 2015-01-21 NOTE — Progress Notes (Signed)
ANTICOAGULATION CONSULT NOTE  Pharmacy Consult for heparin Indication: atrial fibrillation  Allergies  Allergen Reactions  . Codeine Other (See Comments)    unknown  . Levaquin [Levofloxacin In D5w] Itching  . Xylocaine [Lidocaine Hcl] Other (See Comments)    Uncontrolled shaking    Patient Measurements: Height: 4\' 7"  (139.7 cm) Weight: 91 lb 7.9 oz (41.5 kg) (scale b) IBW/kg (Calculated) : 34 Heparin Dosing Weight:   Vital Signs: Temp: 97.5 F (36.4 C) (01/28 0522) Temp Source: Oral (01/28 0522) BP: 108/62 mmHg (01/28 0522) Pulse Rate: 98 (01/28 0522)  Labs:  Recent Labs  01/18/15 1715  01/19/15 0058 01/19/15 0731 01/20/15 0354 01/21/15 0544  HGB 11.2*  --  12.0  --  12.1 12.8  HCT 32.6*  --  35.0*  --  34.8* 37.0  PLT 184  --  202  --  192 217  HEPARINUNFRC  --   < > 0.32 0.51 0.37 0.50  CREATININE 0.60  --  0.65  --  0.74  --   < > = values in this interval not displayed.  Estimated Creatinine Clearance: 25.1 mL/min (by C-G formula based on Cr of 0.74).   Medical History: Past Medical History  Diagnosis Date  . Sinus tachycardia   . Pneumonia   . Scoliosis   . TIA (transient ischemic attack)   . Trigeminal neuralgia of left side of face 12/13/2014  . Hot flashes 12/13/2014  . Acute on chronic diastolic heart failure   . Atrial fibrillation   . Lower extremity edema   . CHF (congestive heart failure)   . Shortness of breath dyspnea     Medications:  See EMR  Assessment: 81 yo F with AFib and dHF - not on anticoagulation PTA.  May require DCCV. Remains on heparin gtt while longterm plan for anticoag is being decided.  HL therapeutic this morning. CBC stable and wnl. No signs of bleeding.  Goal of Therapy:  Heparin level 0.3-0.7 units/ml Monitor platelets by anticoagulation protocol: Yes   Plan:  -Continue heparin at 650 units/hr -Daily HL, CBC -Monitor for s/sx bleeding -Anticipate stopping heparin tomorrow  Agapito GamesAlison Daron Stutz, PharmD,  BCPS Clinical Pharmacist Pager: [phone] 01/21/2015 9:31 AM

## 2015-01-21 NOTE — Progress Notes (Signed)
Patient Name: Melanie Obrien Date of Encounter: 01/21/2015  Principal Problem:   Acute on chronic diastolic heart failure Active Problems:   Atrial fibrillation with RVR   Lower extremity edema   Paroxysmal atrial fibrillation   Hepatopathy - congestive   Malnutrition of moderate degree   Primary Cardiologist: Dr Eldridge Dace  Patient Profile: 81 yo female w/ hx PAF, D-CHF, refused long-term anticoag, was admitted 01/27 w/ rapid atrial fib, CHF.  SUBJECTIVE: Breathing better, no palpitations, no presyncope, not lightheaded  OBJECTIVE Filed Vitals:   01/20/15 2018 01/21/15 0522 01/21/15 0949 01/21/15 1534  BP: 132/80 108/62 96/47 120/72  Pulse: 99 98 97 90  Temp: 98.3 F (36.8 C) 97.5 F (36.4 C)  97.4 F (36.3 C)  TempSrc: Oral Oral  Oral  Resp: 18 16    Height:      Weight:  91 lb 7.9 oz (41.5 kg)    SpO2: 96% 95%  97%    Intake/Output Summary (Last 24 hours) at 01/21/15 1556 Last data filed at 01/21/15 1227  Gross per 24 hour  Intake   1028 ml  Output   1220 ml  Net   -192 ml   Filed Weights   01/19/15 0555 01/20/15 0539 01/21/15 0522  Weight: 98 lb 14.4 oz (44.861 kg) 95 lb 9.6 oz (43.364 kg) 91 lb 7.9 oz (41.5 kg)    PHYSICAL EXAM General: Well developed, well nourished, female in no acute distress. Head: Normocephalic, atraumatic.  Neck: Supple without bruits, JVD 9 cm. Lungs:  Resp regular and unlabored, rales bases. Heart: Irreg irreg, S1, S2, no S3, S4, soft murmur; no rub. Abdomen: Soft, non-tender, non-distended, BS + x 4.  Extremities: No clubbing, cyanosis, no edema.  Neuro: Alert and oriented X 3. Moves all extremities spontaneously. Psych: Normal affect.  LABS: CBC: Recent Labs  01/18/15 1715  01/20/15 0354 01/21/15 0544  WBC 7.2  < > 6.9 7.8  NEUTROABS 5.1  --   --   --   HGB 11.2*  < > 12.1 12.8  HCT 32.6*  < > 34.8* 37.0  MCV 93.1  < > 90.9 91.1  PLT 184  < > 192 217  < > = values in this interval not displayed.  Basic  Metabolic Panel:  Recent Labs  40/98/11 0058 01/20/15 0354  NA 136 135  K 4.1 3.8  CL 99 98  CO2 31 28  GLUCOSE 97 88  BUN 20 19  CREATININE 0.65 0.74  CALCIUM 8.3* 8.2*   Liver Function Tests:  Recent Labs  01/18/15 1715 01/20/15 0354  AST 55* 38*  ALT 65* 47*  ALKPHOS 106 94  BILITOT 0.6 0.7  PROT 5.2* 5.1*  ALBUMIN 2.9* 2.7*   B NATRIURETIC PEPTIDE  Date Value Ref Range Status  01/18/2015 526.6* 0.0 - 100.0 pg/mL Final   BNP: PRO B NATRIURETIC PEPTIDE (BNP)  Date/Time Value Ref Range Status  12/30/2014 04:19 PM 395.0* 0.0 - 100.0 pg/mL Final  12/13/2014 02:05 PM 2339.0* 0 - 450 pg/mL Final   Lab Results  Component Value Date   TSH 3.490 12/14/2014    TELE:   Atrial fib  Current Medications:  . amiodarone  200 mg Oral TID   Followed by  . [START ON 01/23/2015] amiodarone  200 mg Oral BID   Followed by  . [START ON 01/27/2015] amiodarone  200 mg Oral Daily  . clopidogrel  75 mg Oral Daily  . feeding supplement (ENSURE COMPLETE)  237  mL Oral BID PC  . [START ON 01/22/2015] furosemide  20 mg Oral Daily  . loratadine  10 mg Oral Daily  . metoprolol succinate  50 mg Oral Daily  . pregabalin  75 mg Oral BID  . sodium chloride  3 mL Intravenous Q12H   . heparin 650 Units/hr (01/20/15 0300)    ASSESSMENT AND PLAN: Principal Problem:   Acute on chronic diastolic heart failure - pt is below dry weight, Lasix 40 mg IV given once yesterday, will hold Lasix today, restart in am at 20 mg by mouth daily  Active Problems:   Atrial fibrillation with RVR - amiodarone started yesterday, tapered dosing ordered. She consistently refuses long-term anticoagulation, so we'll continue heparin until a.m. her Toprol-XL dose was decreased from 100 mg daily down to 50 mg daily starting today. Continue to follow heart rate and blood pressure; Dr. Abe PeopleVaransi recommended sotalol, M.D. Advise. Her LFTs are abnormal which is her baseline. TSH was last checked in December 2015 and was  normal.    Lower extremity edema - see above, improved    Paroxysmal atrial fibrillation - see above, LINQ monitor showed 3-4 second pauses. Consider EP eval, M.D. advise on inpatient versus outpatient. Continue beta blocker at decreased dose, keep on telemetry    Hepatopathy - congestive - see above, improved    Deconditioning - seen by physical therapy and SNF placement recommended. Social work is aware of patient. [facility] and wishes to go back there. She should continue to work with physical therapy and see how tolerated.  Signed, Cassie FreerRhonda Barett, PA-C   01/21/2015   I have seen, examined and evaluated the patient this AM along with Ms. Raford PitcherBarrett.  After reviewing all the available data and chart,  I agree with her findings, examination as well as impression recommendations.  Please see addendum to note from yesterday. The plan is to start amiodarone for rate/rhythm control. I backed off on her beta blocker. We will not restart diuretic until tomorrow at a lower dose oral 20 mg.  Her LFTs  Have improved with diuresis. I don't think this should preclude us using amiodarone as it would likely confirm congestive hepatopathy.  I would like to continue heparin at least until tomorrow while she is starting her oral load of amiodarone. Difficult situation because she does not want to do long-term and granulation/bulimic regulation of even for short period as an outpatient. I simply concerned that with initiation of an antiarrhythmic agent that we may chemically cardiovert her and would like to be somewhat protected during this period.   appreciate dietitian evaluation with identification of moderate protein malnutrition. She has been given supplementation with Ensure. She has eaten everything on her plate.  Expectation is that we will try to evaluate her mobility and conditioning level. Continue to work with physical therapy while she is here, she was  independent prior to arrival and probably is able to be independent on discharge with rehabilitation.  Marykay LexHARDING, DAVID W, M.D., M.S. Interventional Cardiologist   Pager # [phone]

## 2015-01-22 LAB — GLUCOSE, CAPILLARY
GLUCOSE-CAPILLARY: 124 mg/dL — AB (ref 70–99)
Glucose-Capillary: 124 mg/dL — ABNORMAL HIGH (ref 70–99)
Glucose-Capillary: 89 mg/dL (ref 70–99)

## 2015-01-22 LAB — COMPREHENSIVE METABOLIC PANEL
ALBUMIN: 3 g/dL — AB (ref 3.5–5.2)
ALT: 40 U/L — AB (ref 0–35)
ANION GAP: 8 (ref 5–15)
AST: 47 U/L — AB (ref 0–37)
Alkaline Phosphatase: 92 U/L (ref 39–117)
BILIRUBIN TOTAL: 0.7 mg/dL (ref 0.3–1.2)
BUN: 16 mg/dL (ref 6–23)
CO2: 27 mmol/L (ref 19–32)
Calcium: 8.7 mg/dL (ref 8.4–10.5)
Chloride: 99 mmol/L (ref 96–112)
Creatinine, Ser: 0.88 mg/dL (ref 0.50–1.10)
GFR calc Af Amer: 63 mL/min — ABNORMAL LOW (ref >90)
GFR calc non Af Amer: 55 mL/min — ABNORMAL LOW (ref >90)
Glucose, Bld: 148 mg/dL — ABNORMAL HIGH (ref 70–99)
Potassium: 4.5 mmol/L (ref 3.5–5.1)
Sodium: 134 mmol/L — ABNORMAL LOW (ref 135–145)
Total Protein: 5.6 g/dL — ABNORMAL LOW (ref 6.0–8.3)

## 2015-01-22 LAB — HEPARIN LEVEL (UNFRACTIONATED): Heparin Unfractionated: 0.44 IU/mL (ref 0.30–0.70)

## 2015-01-22 MED ORDER — FUROSEMIDE 40 MG PO TABS
40.0000 mg | ORAL_TABLET | Freq: Every day | ORAL | Status: DC
  Administered 2015-01-23: 40 mg via ORAL
  Filled 2015-01-22 (×2): qty 1

## 2015-01-22 MED ORDER — HEPARIN SODIUM (PORCINE) 5000 UNIT/ML IJ SOLN
5000.0000 [IU] | Freq: Three times a day (TID) | INTRAMUSCULAR | Status: DC
  Administered 2015-01-22 – 2015-01-23 (×3): 5000 [IU] via SUBCUTANEOUS
  Filled 2015-01-22 (×5): qty 1

## 2015-01-22 NOTE — Progress Notes (Signed)
Pt SATs AT 100% after ambulating and working with PT

## 2015-01-22 NOTE — Progress Notes (Signed)
Melanie Obrien is a 81 y.o. female who has had PAF and diastolic heart failure. SHe was admitted and diuresed in the hospital in Dec 2015. She was doing well after leaving the hospital. Over the last several weeks, she has noticed some increased lower extremity swelling. Her weight has also increased. She feels that her face is puffier than usual. She is a little bit more short of breath than usual. She just does not feel herself. She cannot feel any palpitations.  She had one visit with our PA after being in the hospital. She was in normal rhythm at that time. At her primary care doctor's office yesterday, she was found to be back in atrial fibrillation. Her daughter is a primary care doctor. I spoke with her about potentially starting amiodarone to maintain sinus rhythm. There were somewhat hesitant. The patient's lung capacity is also limited by scoliosis.  LFTs mildly elevated, thought to be form passive congestion.  She returns today 5 days later for routine follow-up. She continues to have lower extremity edema. She had no significant diuresis. Her breathing is still somewhat impaired. She is now more willing to admitted to the hospital for elective IV diuresis.  Primary Cardiologist: Eldridge Dace  Subjective:  Feels better Ate well yesterday  Objective:  Vital Signs in the last 24 hours: Temp:  [97.4 F (36.3 C)-98.1 F (36.7 C)] 98 F (36.7 C) (01/29 0505) Pulse Rate:  [90-97] 92 (01/29 0505) Resp:  [18-20] 20 (01/29 0505) BP: (96-120)/(47-72) 118/70 mmHg (01/29 0505) SpO2:  [96 %-98 %] 96 % (01/29 0505) Weight:  [96 lb 5.5 oz (43.7 kg)] 96 lb 5.5 oz (43.7 kg) (01/29 0505)  Intake/Output from previous day: 01/28 0701 - 01/29 0700 In: 996 [P.O.:840; I.V.:156] Out: 350 [Urine:350] Intake/Output from this shift:    Wt Readings from Last 3 Encounters:  01/22/15 96 lb 5.5 oz (43.7 kg)  01/18/15 104 lb 9.6 oz (47.446 kg)  01/13/15 106 lb (48.081 kg)   Physical Exam: General  appearance: alert, cooperative, appears stated age, no distress and thin & frail appearing. Neck: no adenopathy, no carotid bruit and minimal JVD but + HJR Lungs: Normal effort, trace bibasal rales, good air movement. Heart: irregularly irregular rhythm and normal S1&S2, No obvious  M/R/G Abdomen: soft, non-tender; bowel sounds normal; no masses,  no organomegaly and mildly protuberant with probable trace ascites Extremities: edema ~1-2+ and no ulcers, gangrene or trophic changes Pulses: 2+ and symmetric Skin: mild erythematous macular rash on RLE, diffuse small petiechiae Neurologic: Grossly normal  Lab Results:  Recent Labs  01/20/15 0354 01/21/15 0544  WBC 6.9 7.8  HGB 12.1 12.8  PLT 192 217    Recent Labs  01/20/15 0354  NA 135  K 3.8  CL 98  CO2 28  GLUCOSE 88  BUN 19  CREATININE 0.74   No results for input(s): TROPONINI in the last 72 hours.  Invalid input(s): CK, MB Hepatic Function Panel  Recent Labs  01/20/15 0354  PROT 5.1*  ALBUMIN 2.7*  AST 38*  ALT 47*  ALKPHOS 94  BILITOT 0.7   No results for input(s): CHOL in the last 72 hours. No results for input(s): PROTIME in the last 72 hours.  Imaging: No CXR  Cardiac Studies: Tele - Afib 60-90s  Echo from 12/14/2014: EF 55-60%, no regional WMA, Severe LA dilation (makes Afib more difficult to cardiovert & maintain NSR), mild MR, moderately increase PAP ~46 mmHg  EKG - Afib - 81 bpm, LAFB (-  52)  Wt Readings from Last 3 Encounters:  01/22/15 96 lb 5.5 oz (43.7 kg)  01/18/15 104 lb 9.6 oz (47.446 kg)  01/13/15 106 lb (48.081 kg)    Assessment/Plan:  Principal Problem:   Acute on chronic diastolic heart failure Active Problems:   Atrial fibrillation with RVR   Lower extremity edema   Paroxysmal atrial fibrillation   Hepatopathy - congestive   Malnutrition of moderate degree   1. Acute on chronic diastolic heart failure - exacerbated by Afib RVR: Unsuccessful attempt at reducing home dry wgt  with increased PO Lasix of 40 mg BID for 4 days. Tried to get her weight from 106 to 100 lbs. Weight now has only decreased to 104 pounds. Admitted for IV Diuresis.  Current inpatient wgt is 96 lb (probably different scales) - up from 91 lb yesterday.  Suspect that recurrence of Afib with ~RVR has triggered current exacerbation.  Net UOP - ~5 L  Convert to PO this PM - 40 mg daily  Cutting back BB dose may allow for future addition of afterload reduction - but BP is not yet stable enough  2. Trigger for CHF was likely going back to Afib. on 1/24.  Declined long term Anticoagulartion (AC)>  On IV Heparin for now - d/c today as she is not intending to do full AC after d/c - continue Plavix  Per d/w with Dr. Eldridge DaceVaranasi, ER Dr. Ladona Ridgelaylor & Pt + her Dtr - we finally decided to go with Amiodarone for Rate/Rhythm control (less BP effect) - with the thought of Quality of life options.    LFTs mildly elevated, thought to be form passive congestion -- levels have dropped appropriately; should not preclude use of Amiodaron.  Moderate malnutrition -- Ensure supplementation, Eating all of meals  Appreciate Dietician input: suspect sub-optimal energy intake   Deconditioning - PT/OT --> initial recs were SNF, PT would not agree to that, [facility].  CODE STATUS: Dr. Eldridge DaceVaranasi discussed with the patient and her daughter who is also a physician. The patient does not want to be intubated. She would accept any other type of resuscitation. The limited resuscitation orders were placed.  DISPO: Would like to see how she does on PO Lasix & ambulation today.  IF HR has improved & stable in AM - could consider d/c tomorrow.   LOS: 4 days    HARDING, DAVID W 01/22/2015, 8:44 AM

## 2015-01-22 NOTE — Progress Notes (Signed)
Pt in a deep sleep this morning when RN came by for morning medication administration. Pt had complained prior that no one was letting her rest and she had a rough night. PT Devon present and made Pt aware of Pts irritable mood and Pt says she just needs to rest and that is why she is cranky. RN came back after Pt had awaken to administer morning meds. RN will continue to monitor.

## 2015-01-22 NOTE — Progress Notes (Addendum)
Physical Therapy Treatment Patient Details Name: Melanie Obrien MRN: [id number] DOB: 1919-11-02 Today's Date: 01/22/2015    History of Present Illness Patient is a 81 y/o female with PMH of TIA, trigeminal neuralgia, hot flashes and severe scoliosis who presents with an episode of tightness in the center of her chest associated with some shortness of breath and noted to be in A-fib with RVR. Admitted with CAP. Now returned to NSR.    PT Comments    Patient with increased frustration and agitation this session. Patient non-compliant with use of assistive devices and continues to demonstrates instability and increased fall risk, multiple LOB requiring assist or causing patient to reach out and catch herself with wall or railings this session. Patient non receptive to cues for safety or mobility.  At this time, I can not say for sure that patient is safe for independence given recent history of admissions and poor compliance and safety awareness. Highly recommend ST SNF or HHPT and supervision for mobility improvements for which patient is refusing. Will continue to see acutely.   Follow Up Recommendations  Home health PT;Supervision - Intermittent, patient refusing SNF     Equipment Recommendations  None recommended by PT    Recommendations for Other Services       Precautions / Restrictions Precautions Precautions: None Restrictions Weight Bearing Restrictions: No    Mobility  Bed Mobility Overal bed mobility: Modified Independent             General bed mobility comments: extra time to get to side of bed but independent  Transfers Overall transfer level: Modified independent Equipment used: Rolling walker (2 wheeled)             General transfer comment: impulsive with standing effort  Ambulation/Gait Ambulation/Gait assistance: Min guard;Min assist Ambulation Distance (Feet): 160 Feet Assistive device:  (pushing IV pole 10 ft, refusing RW) Gait  Pattern/deviations: Step-through pattern;Decreased step length - right;Decreased step length - left;Decreased stride length;Shuffle;Trunk flexed;Wide base of support Gait velocity: reduced Gait velocity interpretation: Below normal speed for age/gender General Gait Details: patient stoped 4 times during ambulation WU:JWJXre:back pain and to complain about mobility. Patient insistent on using IV pole, advised patient she will not have IV pole to push at home, encouraged use of RW, patient adamently refusing. several noted LOB causing patient to reach out for wall and rail to catch herself. Min assist for stability at times.    Stairs            Wheelchair Mobility    Modified Rankin (Stroke Patients Only)       Balance             Standing balance-Leahy Scale: Fair (dynamic fair-) Standing balance comment: significant trunk flexion and poor compliance with use of assistive device                    Cognition Arousal/Alertness: Awake/alert Behavior During Therapy: Impulsive Overall Cognitive Status: Within Functional Limits for tasks assessed Area of Impairment: Safety/judgement         Safety/Judgement: Decreased awareness of safety          Exercises      General Comments        Pertinent Vitals/Pain      Home Living                      Prior Function            PT Goals (  current goals can now be found in the care plan section) Acute Rehab PT Goals Patient Stated Goal: to get back to [facility] PT Goal Formulation: With patient Time For Goal Achievement: 01/30/15 Potential to Achieve Goals: Good Progress towards PT goals: Progressing toward goals    Frequency  Min 2X/week    PT Plan Current plan remains appropriate    Co-evaluation             End of Session Equipment Utilized During Treatment: Gait belt Activity Tolerance: Patient limited by fatigue;Treatment limited secondary to agitation Patient left: in bed;with call  bell/phone within reach (nurse in room providing meds)     Time: 1121-1140 PT Time Calculation (min) (ACUTE ONLY): 19 min  Charges:  $Gait Training: 8-22 mins                    G CodesFabio Asa 2015-01-31, 1:57 PM Charlotte Crumb, PT DPT  [phone]

## 2015-01-23 LAB — GLUCOSE, CAPILLARY: Glucose-Capillary: 97 mg/dL (ref 70–99)

## 2015-01-23 LAB — HEPARIN LEVEL (UNFRACTIONATED): Heparin Unfractionated: <0.1 IU/mL — ABNORMAL LOW (ref 0.30–0.70)

## 2015-01-23 MED ORDER — FUROSEMIDE 40 MG PO TABS: 40.0000 mg | ORAL_TABLET | Freq: Every day | ORAL | Status: DC

## 2015-01-23 MED ORDER — AMIODARONE HCL 200 MG PO TABS
200.0000 mg | ORAL_TABLET | Freq: Two times a day (BID) | ORAL | Status: DC
Start: 2015-01-23 — End: 2015-01-25

## 2015-01-23 MED ORDER — METOPROLOL SUCCINATE ER 50 MG PO TB24: 50.0000 mg | ORAL_TABLET | Freq: Every day | ORAL | Status: DC

## 2015-01-23 NOTE — Progress Notes (Signed)
Patient discharged to home, transported by daughter.  IV removed prior to discharge; IV site clean, dry, and intact.  Discharge instructions, education, medications, and follow-up discussed with patient and daughter prior to discharge; patient and daughter voiced understanding of information.

## 2015-01-23 NOTE — Discharge Summary (Signed)
Physician Discharge Summary     Cardiologist:  Eldridge Dace Patient ID: Melanie Obrien MRN: [id number] DOB/AGE: 1919/04/19 81 y.o.  Admit date: 01/18/2015 Discharge date: 01/23/2015  Admission Diagnoses:  Acute on chronic diastolic heart failure, Afib RVR.  Discharge Diagnoses:  Principal Problem:   Acute on chronic diastolic heart failure Active Problems:   Atrial fibrillation with RVR   Lower extremity edema   Paroxysmal atrial fibrillation   Hepatopathy - congestive   Malnutrition of moderate degree   Discharged Condition: stable  Hospital Course:    Melanie Obrien is a 81 y.o. female who has had PAF and diastolic heart failure. She was admitted and diuresed in the hospital in Dec 2015. She was doing well after leaving the hospital. Over the last several weeks, she has noticed some increased lower extremity swelling. Her weight has also increased. She feels that her face is puffier than usual. She is a little bit more short of breath than usual. She just does not feel herself. She cannot feel any palpitations.   She had one visit with our PA after being in the hospital. She was in normal rhythm at that time. At her primary care doctor's office yesterday, she was found to be back in atrial fibrillation. Her daughter is a primary care doctor. I spoke with her about potentially starting amiodarone to maintain sinus rhythm. There were somewhat hesitant. The patient's lung capacity is also limited by scoliosis. LFTs mildly elevated, thought to be form passive congestion.   She returns today 5 days later for routine follow-up. She continues to have lower extremity edema. She had no significant diuresis. Her breathing is still somewhat impaired. She is now more willing to admitted to the hospital for elective IV diuresis.  She was started on IV lasix and ultimately diuresed 5.8L.  She was converted back to  PO daily.  Some of her edema was thought to be related to low protein levels.  For  her Afib, she was started on Amiodarone  TID and overall rate control improved. It will be tapered to  daily. She was initially put on increased lopressor without go rate control.  She is now on toprol 50 daily and plavix.  She has refused anticoagulation.  She was noted to have asymptomatic 3-4 second pauses on the monitor.   LFTs were mildly elevated and thought to be from hepatic congestion.  She was seen by PT who recommended Home Health PT, however, the patient has declined.  The morning of her discharge she reported walking the length of the hall without too much difficulty.  The patient was seen by Dr. Royann Shivers who felt she was stable for DC home.    Consults: Nutrition, PT/OT  Significant Diagnostic Studies:  CMP     Component Value Date/Time   NA 134* 01/22/2015 0844   K 4.5 01/22/2015 0844   CL 99 01/22/2015 0844   CO2 27 01/22/2015 0844   GLUCOSE 148* 01/22/2015 0844   BUN 16 01/22/2015 0844   CREATININE 0.88 01/22/2015 0844   CALCIUM 8.7 01/22/2015 0844   PROT 5.6* 01/22/2015 0844   ALBUMIN 3.0* 01/22/2015 0844   AST 47* 01/22/2015 0844   ALT 40* 01/22/2015 0844   ALKPHOS 92 01/22/2015 0844   BILITOT 0.7 01/22/2015 0844   GFRNONAA 55* 01/22/2015 0844   GFRAA 63* 01/22/2015 0844      Treatments:  See above  Discharge Exam: Blood pressure 109/62, pulse 82, temperature 97.2 F (36.2 C), temperature source Oral,  resp. rate 18, height 4\' 7"  (1.397 m), weight 98 lb 6.4 oz (44.634 kg), SpO2 100 %.   Disposition: [facility]-Another Health Care Institution Not Defined      Discharge Instructions    Diet - low sodium heart healthy    Complete by:  As directed      Discharge instructions    Complete by:  As directed   Monitor your weight every morning.  If you gain 3 pounds in 24 hours, or 5 pounds in a week, call the office for instructions.     Increase activity slowly    Complete by:  As directed             Medication List    TAKE these medications         amiodarone 200 MG tablet  Commonly known as:  PACERONE  Take 1 tablet (200 mg total) by mouth 2 (two) times daily.     b complex vitamins tablet  Take 0.5 tablets by mouth 3 (three) times daily.     BETA CAROTENE PO  Take 1 tablet by mouth daily.     CALCIUM PO  Take 1 tablet by mouth daily.     CHROMIUM PO  Take 1 tablet by mouth daily.     clopidogrel 75 MG tablet  Commonly known as:  PLAVIX  Take 75 mg by mouth daily.     estradiol 1 MG tablet  Commonly known as:  ESTRACE  Take 0.5 mg by mouth daily.     fexofenadine 180 MG tablet  Commonly known as:  ALLEGRA  Take 180 mg by mouth daily.     furosemide 40 MG tablet  Commonly known as:  LASIX  Take 1 tablet (40 mg total) by mouth daily.     MAGNESIUM PO  Take 1 tablet by mouth daily.     metoprolol succinate 50 MG 24 hr tablet  Commonly known as:  TOPROL-XL  Take 1 tablet (50 mg total) by mouth daily. Take with or immediately following a meal.     MULTIVITAMIN PO  Take 1 tablet by mouth daily.     PANTOTHENIC ACID PO  Take 1 tablet by mouth daily.     pregabalin 75 MG capsule  Commonly known as:  LYRICA  Take 75 mg by mouth 2 (two) times daily.     progesterone 100 MG capsule  Commonly known as:  PROMETRIUM  Take 100 mg by mouth every other day.     selenium 50 MCG Tabs tablet  Take 50 mcg by mouth daily.     VITAMIN A PO  Take 1 tablet by mouth daily.     VITAMIN B 12 PO  Take 1 tablet by mouth daily.     VITAMIN C PO  Take 1 g by mouth 3 (three) times daily.     VITAMIN D-3 PO  Take 1 tablet by mouth daily.     VITAMIN E PO  Take 1 tablet by mouth daily.     ZINC PO  Take 1 tablet by mouth daily.       Follow-up Information    Follow up with Corky CraftsVARANASI,JAYADEEP S., MD.   Specialty:  Interventional Cardiology   Why:  The office will call with the follow up appt date and time   Contact information:   [address] [place] [address] [phone]      Greater  than 30 minutes was spent completing the patient's discharge.  SignedWilburt Finlay, PAC 01/23/2015, 12:28 PM

## 2015-01-23 NOTE — Progress Notes (Signed)
Subjective: Feeling better.  Ambulated down the hall without any assistance. Some SOB.  Objective: Vital signs in last 24 hours: Temp:  [97.1 F (36.2 C)-97.9 F (36.6 C)] 97.2 F (36.2 C) (01/30 0444) Pulse Rate:  [82-100] 82 (01/30 0444) Resp:  [18-20] 18 (01/30 0444) BP: (109-132)/(55-85) 109/62 mmHg (01/30 0444) SpO2:  [100 %] 100 % (01/30 0444) Weight:  [98 lb 6.4 oz (44.634 kg)] 98 lb 6.4 oz (44.634 kg) (01/30 0444) Last BM Date: 01/23/15  Intake/Output from previous day: 01/29 0701 - 01/30 0700 In: 724.5 [P.O.:640; I.V.:84.5] Out: 1450 [Urine:1450] Intake/Output this shift: Total I/O In: 240 [P.O.:240] Out: 250 [Urine:250]  Medications Current Facility-Administered Medications  Medication Dose Route Frequency Provider Last Rate Last Dose  . 0.9 %  sodium chloride infusion  250 mL Intravenous PRN Corky CraftsJayadeep S Varanasi, MD 10 mL/hr at 01/22/15 2234 250 mL at 01/22/15 2234  . acetaminophen (TYLENOL) tablet 650 mg  650 mg Oral Q4H PRN Corky CraftsJayadeep S Varanasi, MD      . amiodarone (PACERONE) tablet 200 mg  200 mg Oral TID Marykay Lexavid W Harding, MD   200 mg at 01/23/15 1016   Followed by  . amiodarone (PACERONE) tablet 200 mg  200 mg Oral BID Marykay Lexavid W Harding, MD       Followed by  . [START ON 01/27/2015] amiodarone (PACERONE) tablet 200 mg  200 mg Oral Daily Marykay Lexavid W Harding, MD      . clopidogrel (PLAVIX) tablet 75 mg  75 mg Oral Daily Corky CraftsJayadeep S Varanasi, MD   75 mg at 01/23/15 1016  . feeding supplement (ENSURE COMPLETE) (ENSURE COMPLETE) liquid 237 mL  237 mL Oral BID PC Lorraine Laxeanne J Barnett, RD   237 mL at 01/22/15 2032  . furosemide (LASIX) tablet 40 mg  40 mg Oral Daily Marykay Lexavid W Harding, MD   40 mg at 01/23/15 [id number]  . heparin injection 5,000 Units  5,000 Units Subcutaneous 3 times per day Marykay Lexavid W Harding, MD   5,000 Units at 01/23/15 [id number]  . loratadine (CLARITIN) tablet 10 mg  10 mg Oral Daily Corky CraftsJayadeep S Varanasi, MD   10 mg at 01/20/15 1114  . metoprolol succinate (TOPROL-XL) 24 hr  tablet 50 mg  50 mg Oral Daily Marykay Lexavid W Harding, MD   50 mg at 01/23/15 1016  . ondansetron (ZOFRAN) injection 4 mg  4 mg Intravenous Q6H PRN Corky CraftsJayadeep S Varanasi, MD      . pregabalin (LYRICA) capsule 75 mg  75 mg Oral BID Corky CraftsJayadeep S Varanasi, MD   75 mg at 01/23/15 1016  . sodium chloride 0.9 % injection 3 mL  3 mL Intravenous Q12H Corky CraftsJayadeep S Varanasi, MD   3 mL at 01/23/15 1018  . sodium chloride 0.9 % injection 3 mL  3 mL Intravenous PRN Corky CraftsJayadeep S Varanasi, MD        PE: General appearance: alert, cooperative and no distress Lungs: clear to auscultation bilaterally Heart: irregularly irregular rhythm and No MM Extremities: No LEE Pulses: 2+ and symmetric Skin: Warm and dry Neurologic: Grossly normal  Lab Results:   Recent Labs  01/21/15 0544  WBC 7.8  HGB 12.8  HCT 37.0  PLT 217   BMET  Recent Labs  01/22/15 0844  NA 134*  K 4.5  CL 99  CO2 27  GLUCOSE 148*  BUN 16  CREATININE 0.88  CALCIUM 8.7      Assessment/Plan  Principal Problem:   Acute on chronic diastolic heart failure Net  fluids: -0.7/-5.8L.  On Lasix  PO daily as of yesterday. Home lasix was QOD 20/40mg .  Potassium and SCr WNL.    Atrial fibrillation with RVR AMIO  TID(new) for rate control.  toprol XL 50.  Rate overall controlled.  She only weight 98#.  Can probably decrease Amio to  daily.  Declines anticoag. On plavix   Lower extremity edema  Resolved   Hepatopathy - congestive, stable   Malnutrition of moderate degree     She has declined home PT.  Ambulated the length of the hall without apparent difficulties.   FU in 1-2 weeks.    LOS: 5 days    HAGER, BRYAN PA-C 01/23/2015 11:05 AM   I have seen and examined the patient along with HAGER, BRYAN PA-C.  I have reviewed the chart, notes and new data.  I agree with PA/NP's note.  Feels back to baseline. Good ventricular rate control. Needs early follow up. DC home.  Thurmon Fair, MD, [facility] [phone] 01/23/2015, 12:10 PM

## 2015-01-25 ENCOUNTER — Encounter: Payer: Self-pay | Admitting: Internal Medicine

## 2015-01-25 ENCOUNTER — Ambulatory Visit (INDEPENDENT_AMBULATORY_CARE_PROVIDER_SITE_OTHER): Payer: Medicare PPO | Admitting: Internal Medicine

## 2015-01-25 VITALS — BP 124/50 | HR 55 | Ht 60.0 in | Wt 102.4 lb

## 2015-01-25 DIAGNOSIS — I5033 Acute on chronic diastolic (congestive) heart failure: Secondary | ICD-10-CM

## 2015-01-25 DIAGNOSIS — R001 Bradycardia, unspecified: Secondary | ICD-10-CM | POA: Diagnosis not present

## 2015-01-25 DIAGNOSIS — I480 Paroxysmal atrial fibrillation: Secondary | ICD-10-CM | POA: Diagnosis not present

## 2015-01-25 DIAGNOSIS — Z8673 Personal history of transient ischemic attack (TIA), and cerebral infarction without residual deficits: Secondary | ICD-10-CM

## 2015-01-25 DIAGNOSIS — Z95818 Presence of other cardiac implants and grafts: Secondary | ICD-10-CM

## 2015-01-25 LAB — MDC_IDC_ENUM_SESS_TYPE_INCLINIC

## 2015-01-25 MED ORDER — APIXABAN 2.5 MG PO TABS: 2.5000 mg | ORAL_TABLET | Freq: Two times a day (BID) | ORAL | Status: DC

## 2015-01-25 MED ORDER — TORSEMIDE 20 MG PO TABS: 20.0000 mg | ORAL_TABLET | Freq: Every day | ORAL | Status: DC

## 2015-01-25 NOTE — Progress Notes (Signed)
Patient Care Team: Gaye AlkenElizabeth Stewart Barnes, MD as PCP - General (Family Medicine)   HPI  [name] is a 81 y.o. female Seen to establish follow-up for her implantable loop recorder. She has a history of a TIA and atrial fibrillation detected on her monitor.  She has a history of acute on chronic HFpEF treated with diuresis. She was in fact hospitalized just last week for this with atrial fibrillation with a rapid rate. She was diuresed intravenously with a loss of 6 L of fluid. She was started on amiodarone. She also has adjunctive beta blockers  She takes Plavix for anticoagulation    Last few days she's had increasing shortness of breath and has put on 4 pounds in the last 48 hours. She has notable edema. She has not responded to her Lasix.  She has a history of falls that prompted her implantable loop recorder. She has had none in about 2 years.  Implantable loop recorder has demonstrated posttermination pauses.  Past Medical History  Diagnosis Date  . Sinus tachycardia   . Pneumonia   . Scoliosis   . TIA (transient ischemic attack)   . Trigeminal neuralgia of left side of face 12/13/2014  . Hot flashes 12/13/2014  . Acute on chronic diastolic heart failure   . Atrial fibrillation   . Lower extremity edema   . CHF (congestive heart failure)   . Shortness of breath dyspnea     Past Surgical History  Procedure Laterality Date  . Tonsillectomy      Current Outpatient Prescriptions  Medication Sig Dispense Refill  . amiodarone (PACERONE) 200 MG tablet Take 200 mg by mouth daily.    . Ascorbic Acid (VITAMIN C PO) Take 1 g by mouth 3 (three) times daily.     Marland Kitchen. b complex vitamins tablet Take 0.5 tablets by mouth 3 (three) times daily.     Marland Kitchen. BETA CAROTENE PO Take 1 tablet by mouth daily.    Marland Kitchen. CALCIUM PO Take 1 tablet by mouth daily.    . Cholecalciferol (VITAMIN D-3 PO) Take 1 tablet by mouth daily.    . CHROMIUM PO Take 1 tablet by mouth daily.    .  clopidogrel (PLAVIX) 75 MG tablet Take 75 mg by mouth daily.    . Cyanocobalamin (VITAMIN B 12 PO) Take 1 tablet by mouth daily.    Marland Kitchen. estradiol (ESTRACE) 1 MG tablet Take 0.5 mg by mouth daily.    . fexofenadine (ALLEGRA) 180 MG tablet Take 180 mg by mouth daily.    . furosemide (LASIX) 40 MG tablet Take 1 tablet (40 mg total) by mouth daily. 30 tablet 5  . MAGNESIUM PO Take 1 tablet by mouth daily.    . metoprolol succinate (TOPROL-XL) 50 MG 24 hr tablet Take 1 tablet (50 mg total) by mouth daily. Take with or immediately following a meal. 30 tablet 5  . Multiple Vitamins-Minerals (MULTIVITAMIN PO) Take 1 tablet by mouth daily.    Marland Kitchen. PANTOTHENIC ACID PO Take 1 tablet by mouth daily.    . pregabalin (LYRICA) 50 MG capsule Take 50 mg by mouth 2 (two) times daily.    . progesterone (PROMETRIUM) 100 MG capsule Take 100 mg by mouth every other day.     . selenium 50 MCG TABS tablet Take 50 mcg by mouth daily.    Marland Kitchen. VITAMIN A PO Take 1 tablet by mouth daily.    Marland Kitchen. VITAMIN E PO Take 1 tablet by  mouth daily.     No current facility-administered medications for this visit.    History   Social History  . Marital Status: Widowed    Spouse Name: N/A    Number of Children: N/A  . Years of Education: N/A   Occupational History  . Not on file.   Social History Main Topics  . Smoking status: Never Smoker   . Smokeless tobacco: Never Used  . Alcohol Use: No  . Drug Use: No  . Sexual Activity: Not on file   Other Topics Concern  . Not on file   Social History Narrative     Review of Systems negative except from HPI and PMH  Physical Exam BP 124/50 mmHg  Pulse 55  Ht 5' (1.524 m)  Wt 102 lb 6.4 oz (46.448 kg)  BMI 20.00 kg/m2 Well developed and well nourished in no acute distress HENT normal E scleral and icterus clear Neck Supple JVP9-10 ; carotids brisk and full Clear to ausculation  Regular rate and rhythm, 3/6  Murmur at apex   gallops or rub Soft with active bowel sounds No  clubbing cyanosis 2+ Edema Alert and oriented, grossly normal motor and sensory function Skin Warm and Dry  ECG demonstrates sinus rhythm with frequent PACs at a rate of 55 intervals 23/10/47 Left axis deviation and poor R-wave progression Low-voltage limb leads  Loop recorder was interrogated demonstrating atrial fibrillation, posttermination pauses and sinus bradycardia  Assessment and  Plan  Atrial fibrillation-paroxysmal  Sinus bradycardia  Implantable loop recorder  First-degree AV block/left axis deviation  Acute on chronic HFpEF  History of TIA  The patient is acutely decompensated with increased weight and volume overload. She has been in sinus rhythm although there has been apparently quite a declining as her heart rate today is in the mid 40s.  This bradycardia is normal certainly contributing to her heart failure; we will discontinue the metoprolol.  The continued accumulation amiodarone may well result in recurrence or persistence of symptomatic bradycardia; this will prompt the need for backup bradycardia pacing.  At this juncture however, I am hopeful but not optimistic that the discontinuation of the beta blocker will be sufficient.  She also has evidence of posttermination pauses telemetry health without evidence of posttermination. Is another potential source of injury. We will continue to monitor her implantable recorder to catch this.  Her conduction system disease is also a potential problem with the amiodarone  Given the right-sided heart failure, we will change her furosemide to torsemide in hopes that we can have improved diuresis. She is scheduled to see Dr. Hedy Jacob next week. I will defer to him further management of her heart failure. She is advised to call us if  diuresis is not effected or symptoms worsen.  We also had a lengthy discussion regarding stroke prevention. Her CHADS-VASc score is age-13, TIA-2, hypertension-1, heart failure-1, gender-1 >=7  associated with a nearly 10% annual risk of stroke. Plavix is insufficient. After lengthy discussion, the patient and her daughter, Dr. Glennon Hamilton, had agreed to switch from Plavix to apixaban. We will doses at 2.5 mg twice daily. We have discussed the associated risk of falls.

## 2015-01-25 NOTE — Patient Instructions (Addendum)
Your physician has recommended you make the following change in your medication:  1) STOP Furosemide  2) STOP Plavix 3) STOP Metoprolol 4) START Torsemide 20 mg daily 5) START Eliquis 2.5 mg twice daily  Your physician recommends that you schedule a follow-up appointment in: 2 months with Dr. Graciela HusbandsKlein.

## 2015-02-04 ENCOUNTER — Encounter: Payer: Self-pay | Admitting: Interventional Cardiology

## 2015-02-04 ENCOUNTER — Ambulatory Visit (INDEPENDENT_AMBULATORY_CARE_PROVIDER_SITE_OTHER): Payer: Medicare PPO | Admitting: Interventional Cardiology

## 2015-02-04 VITALS — BP 110/58 | HR 112 | Ht 60.0 in | Wt 100.0 lb

## 2015-02-04 DIAGNOSIS — I480 Paroxysmal atrial fibrillation: Secondary | ICD-10-CM

## 2015-02-04 DIAGNOSIS — R0602 Shortness of breath: Secondary | ICD-10-CM

## 2015-02-04 LAB — BASIC METABOLIC PANEL WITH GFR
BUN: 26 mg/dL — ABNORMAL HIGH (ref 6–23)
CO2: 35 meq/L — ABNORMAL HIGH (ref 19–32)
Calcium: 8.6 mg/dL (ref 8.4–10.5)
Chloride: 98 meq/L (ref 96–112)
Creatinine, Ser: 0.82 mg/dL (ref 0.40–1.20)
GFR: 68.99 mL/min (ref >60.00)
Glucose, Bld: 142 mg/dL — ABNORMAL HIGH (ref 70–99)
Potassium: 3.9 meq/L (ref 3.5–5.1)
Sodium: 137 meq/L (ref 135–145)

## 2015-02-04 LAB — BRAIN NATRIURETIC PEPTIDE: Pro B Natriuretic peptide (BNP): 330 pg/mL — ABNORMAL HIGH (ref 0.0–100.0)

## 2015-02-04 NOTE — Patient Instructions (Signed)
Your physician recommends that you weigh, daily, at the same time every day, and in the same amount of clothing.  Your physician has recommended you make the following change in your medication:  1) TAKE an extra Torsemide 20mg  if there is a weight gain or greater than 2 pounds  Your physician recommends that you have lab work today (BMET, BNP)  Your physician recommends that you schedule a follow-up appointment in: 4 months with Dr. Eldridge DaceVaranasi.

## 2015-02-04 NOTE — Progress Notes (Signed)
Patient ID: Melanie Chancellorrene H Sealey, female   DOB: Jun 04, 1919, 81 y.o.   MRN: [id number] Patient ID: Melanie Chancellorrene H Nienow, female   DOB: Jun 04, 1919, 81 y.o.   MRN: [id number]    [address] [place], [place]  [address] Phone: [phone]) [phone] Fax:  [phone]  Date:  02/04/2015   ID:  Melanie Chancellorrene H Amon, DOB Jun 04, 1919, MRN [id number]  PCP:  Gaye AlkenBARNES,ELIZABETH STEWART, MD      History of Present Illness: Melanie Obrien is a 81 y.o. female who has had PAF and diastolic heart failure.  SHe was admitted and diuresed in the hospital in Dec 2015.  She was doing well after leaving the hospital. Over the last several weeks, she has noticed some increased lower extremity swelling. Her weight has also increased. She feels that her face is puffier than usual. She is a little bit more short of breath than usual. She just does not feel herself. She cannot feel any palpitations.  She had more shortness of breath. She was found to be fluid overloaded and admitted again. She was started on amiodarone for rate and rhythm control. She was also switched to Eliquis. Overall, she is doing better. Her torsemide is working better than furosemide.  Wt Readings from Last 3 Encounters:  02/04/15 100 lb (45.36 kg)  01/25/15 102 lb 6.4 oz (46.448 kg)  01/23/15 98 lb 6.4 oz (44.634 kg)     Past Medical History  Diagnosis Date  . Sinus tachycardia   . Pneumonia   . Scoliosis   . TIA (transient ischemic attack)   . Trigeminal neuralgia of left side of face 12/13/2014  . Hot flashes 12/13/2014  . Acute on chronic diastolic heart failure   . Atrial fibrillation   . Lower extremity edema   . CHF (congestive heart failure)   . Shortness of breath dyspnea     Current Outpatient Prescriptions  Medication Sig Dispense Refill  . amiodarone (PACERONE) 200 MG tablet Take 200 mg by mouth daily.    Marland Kitchen. apixaban (ELIQUIS) 2.5 MG TABS tablet Take 1 tablet (2.5 mg total) by mouth 2 (two) times daily. 60 tablet 0  . apixaban (ELIQUIS)  2.5 MG TABS tablet Take 1 tablet (2.5 mg total) by mouth 2 (two) times daily. 60 tablet 2  . Ascorbic Acid (VITAMIN C PO) Take 1 g by mouth 3 (three) times daily.     Marland Kitchen. b complex vitamins tablet Take 0.5 tablets by mouth 3 (three) times daily.     Marland Kitchen. BETA CAROTENE PO Take 1 tablet by mouth daily.    Marland Kitchen. CALCIUM PO Take 1 tablet by mouth daily.    . Cholecalciferol (VITAMIN D-3 PO) Take 1 tablet by mouth daily.    . CHROMIUM PO Take 1 tablet by mouth daily.    . Cyanocobalamin (VITAMIN B 12 PO) Take 1 tablet by mouth daily.    Marland Kitchen. estradiol (ESTRACE) 1 MG tablet Take 0.5 mg by mouth daily.    . fexofenadine (ALLEGRA) 180 MG tablet Take 180 mg by mouth daily.    Marland Kitchen. MAGNESIUM PO Take 1 tablet by mouth daily.    . Multiple Vitamins-Minerals (MULTIVITAMIN PO) Take 1 tablet by mouth daily.    Marland Kitchen. PANTOTHENIC ACID PO Take 1 tablet by mouth daily.    . pregabalin (LYRICA) 50 MG capsule Take 50 mg by mouth 2 (two) times daily. TAKE S 75MG  IN AM AND 50 MG IN PM    . progesterone (PROMETRIUM) 100 MG  capsule Take 100 mg by mouth every other day.     . selenium 50 MCG TABS tablet Take 50 mcg by mouth daily.    Marland Kitchen torsemide (DEMADEX) 20 MG tablet Take 1 tablet (20 mg total) by mouth daily. 60 tablet 3  . VITAMIN A PO Take 1 tablet by mouth daily.    Marland Kitchen VITAMIN E PO Take 1 tablet by mouth daily.     No current facility-administered medications for this visit.    Allergies:    Allergies  Allergen Reactions  . Codeine Other (See Comments)    unknown  . Levaquin [Levofloxacin In D5w] Itching  . Loratadine     TACHYCARDIA  . Xylocaine [Lidocaine Hcl] Other (See Comments)    Uncontrolled shaking    Social History:  The patient  reports that she has never smoked. She has never used smokeless tobacco. She reports that she does not drink alcohol or use illicit drugs.   Family History:  The patient's family history includes Hypertension in her father.   ROS:  Please see the history of present illness.  No  nausea, vomiting.  No fevers, chills.  No focal weakness.  No dysuria. ocasional palpitations.   All other systems reviewed and negative.   PHYSICAL EXAM: VS:  BP 110/58 mmHg  Pulse 112  Ht 5' (1.524 m)  Wt 100 lb (45.36 kg)  BMI 19.53 kg/m2 General: Well developed, well nourished, in no acute distress HEENT: normal Neck: no JVD, no carotid bruits Cardiac:  normal S1, S2; irregularly irregular Lungs:  clear to auscultation bilaterally, no wheezing, rhonchi or rales Abd: soft, nontender, no hepatomegaly Ext: bilateral pitting edema2+ Skin: warm and dry Neuro:   no focal abnormalities noted Psych: normal affect      ASSESSMENT AND PLAN:  1. Acute on chronic diastolic heart failure: doing much better on the oral torsemide. If her weight goes up by more than 2 points, would have her take an extra 20 mg torsemide tablet.  Mild DOE.  2. Atrial fibrillation: Still on amiodarone. No other beta blocker to avoid any further pauses.  They are willing to undergo pacemaker implantation if this is necessary. 3. Of note, patient had a LINQ monitor placed in [place] a few years ago.At last visit, it showed mostly AFib in the past few months.  She had some asymptomatic 3-4 second pauses. Will check that her home monitoring of the LINQ is picking up the transmissions. 4.  LFTs mildly elevated, thought to be from passive congestion.  Her LFTs improved with diuresis. 5.  CODE STATUS discussed prior to her admissionwith the patient and her daughter who is also a physician. The patient does not want to be intubated. She would accept any other type of resuscitation. The limited resuscitation orders were placed. 6. Occasional dizziness: Watch for any signs of dehydration. The patient is drinking plenty of water.  Improved in the last few days.   Signed, Fredric Mare, MD, [facility] 02/04/2015 3:18 PM

## 2015-02-08 ENCOUNTER — Telehealth: Payer: Self-pay | Admitting: *Deleted

## 2015-02-08 DIAGNOSIS — E0590 Thyrotoxicosis, unspecified without thyrotoxic crisis or storm: Secondary | ICD-10-CM

## 2015-02-08 NOTE — Telephone Encounter (Signed)
Spoke with daughter and made her aware that Dr. Eldridge DaceVaranasi would like her TSH scheduled 2-3 months after starting Amiodarone. Pt is to see Dr. Graciela HusbandsKlein on 04/06/15. Made lab appt for same day. Daughter verbalized understanding and was in agreement with this plan.

## 2015-02-08 NOTE — Telephone Encounter (Signed)
Pause episode on LINQ for 2-14 and 2-9 but unable to reach pt due number not being correct in Epic. Pt has appointment with SK 04-06-15 @ 1600.

## 2015-02-08 NOTE — Telephone Encounter (Signed)
-----   Message from Corky CraftsJayadeep S Varanasi, MD sent at 02/04/2015  6:14 PM EST ----- Renal function stable.  BNP improved.

## 2015-02-25 ENCOUNTER — Ambulatory Visit (INDEPENDENT_AMBULATORY_CARE_PROVIDER_SITE_OTHER): Payer: Medicare PPO | Admitting: *Deleted

## 2015-02-25 ENCOUNTER — Encounter: Payer: Self-pay | Admitting: Internal Medicine

## 2015-02-25 DIAGNOSIS — R55 Syncope and collapse: Secondary | ICD-10-CM

## 2015-02-25 LAB — MDC_IDC_ENUM_SESS_TYPE_REMOTE: MDC IDC SESS DTM: 20160303070500

## 2015-02-26 NOTE — Progress Notes (Signed)
Loop recorder 

## 2015-03-02 ENCOUNTER — Telehealth: Payer: Self-pay | Admitting: Internal Medicine

## 2015-03-02 NOTE — Telephone Encounter (Signed)
New Message        Pt's daughter calling stating that pt is having brachycardia and needs to be seen sooner, Dr. Graciela HusbandsKlein does not have any open appts that are sooner that pt's upcoming appt in April. Pt also wants to talk to Dr. Graciela HusbandsKlein about a pacemaker. Please call back and advise.

## 2015-03-03 NOTE — Telephone Encounter (Signed)
Patient's dtr (Dr. Uvaldo RisingMcNeil) returned my call. She explains mom has not been feeling well since episodes yesterday but that this is not uncommon after these incidents.  Patient normally doesn't feel good, is tired and weight goes up when HR stays in the 60s for 24-48 hours. Daughter states that they will continue to monitor for another week and let us know if concern for follow up is needed. She understands that there is nothing to be done at this time related to bradycardia during sleep.  Reported vital signs below: 3/4   BP 118-129/60, HR 55-65 3/5   Pulse went back up, weight went from 99 to 102 pounds.  Took an extra Torsemide, resulting in loss of 3 pounds. 3/8  BP 101/56, HR 80-92 3/9  BP 107/60, weight 100 lb (this is near baseline weight of 98-99 lb).

## 2015-03-04 ENCOUNTER — Encounter: Payer: Self-pay | Admitting: Internal Medicine

## 2015-03-09 ENCOUNTER — Encounter: Payer: Self-pay | Admitting: Internal Medicine

## 2015-03-11 ENCOUNTER — Encounter: Payer: Self-pay | Admitting: Internal Medicine

## 2015-03-23 ENCOUNTER — Encounter: Payer: Self-pay | Admitting: Internal Medicine

## 2015-03-25 ENCOUNTER — Encounter: Payer: Self-pay | Admitting: Internal Medicine

## 2015-03-26 ENCOUNTER — Ambulatory Visit (INDEPENDENT_AMBULATORY_CARE_PROVIDER_SITE_OTHER): Payer: Medicare PPO | Admitting: *Deleted

## 2015-03-26 ENCOUNTER — Ambulatory Visit (INDEPENDENT_AMBULATORY_CARE_PROVIDER_SITE_OTHER): Payer: Medicare PPO | Admitting: Internal Medicine

## 2015-03-26 ENCOUNTER — Encounter: Payer: Self-pay | Admitting: Internal Medicine

## 2015-03-26 VITALS — BP 128/64 | HR 82 | Ht 60.0 in | Wt 100.0 lb

## 2015-03-26 DIAGNOSIS — R55 Syncope and collapse: Secondary | ICD-10-CM

## 2015-03-26 DIAGNOSIS — I480 Paroxysmal atrial fibrillation: Secondary | ICD-10-CM

## 2015-03-26 DIAGNOSIS — I5032 Chronic diastolic (congestive) heart failure: Secondary | ICD-10-CM

## 2015-03-26 DIAGNOSIS — Z79899 Other long term (current) drug therapy: Secondary | ICD-10-CM

## 2015-03-26 LAB — MDC_IDC_ENUM_SESS_TYPE_INCLINIC
Date Time Interrogation Session: 20160401161123
Zone Setting Detection Interval: 2000 ms
Zone Setting Detection Interval: 3000 ms
Zone Setting Detection Interval: 430 ms

## 2015-03-26 LAB — BASIC METABOLIC PANEL
BUN: 23 mg/dL (ref 6–23)
CO2: 29 mEq/L (ref 19–32)
Calcium: 8.8 mg/dL (ref 8.4–10.5)
Chloride: 96 mEq/L (ref 96–112)
Creat: 0.98 mg/dL (ref 0.50–1.10)
Glucose, Bld: 98 mg/dL (ref 70–99)
POTASSIUM: 4.3 meq/L (ref 3.5–5.3)
SODIUM: 138 meq/L (ref 135–145)

## 2015-03-26 LAB — MAGNESIUM: Magnesium: 2 mg/dL (ref 1.5–2.5)

## 2015-03-26 NOTE — Patient Instructions (Addendum)
Your physician recommends that you continue on your current medications as directed. Please refer to the Current Medication list given to you today.  Lab Today:Bmet, Magnesium, TSH  Follow up with Dr.Varanasi as planned in June 2016  Your physician wants you to follow-up in: 1 year with Dr.Klein You will receive a reminder letter in the mail two months in advance. If you don't receive a letter, please call our office to schedule the follow-up appointment.

## 2015-03-26 NOTE — Progress Notes (Signed)
Patient Care Team: Juluis Rainier, MD as PCP - General (Family Medicine)   HPI  Melanie Obrien is a 81 y.o. female Seen to establish follow-up for her implantable loop recorder. She has a history of a TIA and atrial fibrillation detected on her monitor. She is here with her daughter Dr. [name].  She has a history of acute on chronic HFpEF treated with diuresis. At her last visit we change her furosemide to torsemide with a marvelous response. We also switched her from Plavix to apixaban.    She has had an intercurrent fall. Loop interrogation demonstrated no associated arrhythmia. She has had however, a number of posttermination pauses of greater than or equal to 4 seconds. These have been asymptomatic.   Device interrogation is also demonstrated a gradual decrease in the burden of atrial fibrillation as well as a gradual decrease in the average daily heart rate.   Past Medical History  Diagnosis Date  . Sinus tachycardia   . Pneumonia   . Scoliosis   . TIA (transient ischemic attack)   . Trigeminal neuralgia of left side of face 12/13/2014  . Hot flashes 12/13/2014  . Acute on chronic diastolic heart failure   . Atrial fibrillation   . Lower extremity edema   . CHF (congestive heart failure)   . Shortness of breath dyspnea     Past Surgical History  Procedure Laterality Date  . Tonsillectomy      Current Outpatient Prescriptions  Medication Sig Dispense Refill  . amiodarone (PACERONE) 200 MG tablet Take 200 mg by mouth daily.    Marland Kitchen apixaban (ELIQUIS) 2.5 MG TABS tablet Take 1 tablet (2.5 mg total) by mouth 2 (two) times daily. 60 tablet 0  . Ascorbic Acid (VITAMIN C PO) Take 1 g by mouth 3 (three) times daily.     Marland Kitchen b complex vitamins tablet Take 0.5 tablets by mouth 3 (three) times daily.     Marland Kitchen BETA CAROTENE PO Take 1 tablet by mouth daily.    Marland Kitchen CALCIUM PO Take 1 tablet by mouth daily.    . Cholecalciferol (VITAMIN D-3 PO) Take 1 tablet by mouth daily.     . CHROMIUM PO Take 1 tablet by mouth daily.    . Cyanocobalamin (VITAMIN B 12 PO) Take 1 tablet by mouth daily.    Marland Kitchen estradiol (ESTRACE) 1 MG tablet Take 0.5 mg by mouth daily.    . fexofenadine (ALLEGRA) 180 MG tablet Take 180 mg by mouth daily.    Marland Kitchen MAGNESIUM PO Take 1 tablet by mouth daily.    . Multiple Vitamins-Minerals (MULTIVITAMIN PO) Take 1 tablet by mouth daily.    Marland Kitchen PANTOTHENIC ACID PO Take 1 tablet by mouth daily.    . pregabalin (LYRICA) 50 MG capsule Take 50 mg by mouth 2 (two) times daily. TAKE S  IN AM AND 50 MG IN PM    . progesterone (PROMETRIUM) 100 MG capsule Take 100 mg by mouth every other day.     . selenium 50 MCG TABS tablet Take 50 mcg by mouth daily.    Marland Kitchen torsemide (DEMADEX) 20 MG tablet Take 1 tablet (20 mg total) by mouth daily. 60 tablet 3  . VITAMIN A PO Take 1 tablet by mouth daily.    Marland Kitchen VITAMIN E PO Take 1 tablet by mouth daily.     No current facility-administered medications for this visit.    History   Social History  . Marital Status:  Widowed    Spouse Name: N/A  . Number of Children: N/A  . Years of Education: N/A   Occupational History  . Not on file.   Social History Main Topics  . Smoking status: Never Smoker   . Smokeless tobacco: Never Used  . Alcohol Use: No  . Drug Use: No  . Sexual Activity: Not on file   Other Topics Concern  . Not on file   Social History Narrative     Review of Systems negative except from HPI and PMH  Physical Exam BP 128/64 mmHg  Pulse 82  Ht 5' (1.524 m)  Wt 100 lb (45.36 kg)  BMI 19.53 kg/m2 Well developed and well nourished in no acute distress HENT normal E scleral and icterus clear Neck Supple JVP 6-8 ; carotids brisk and full Clear to ausculation  Regular rate and rhythm, 3/6  Murmur at apex   gallops or rub Soft with active bowel sounds No clubbing cyanosis 2+ Edema Alert and oriented, grossly normal motor and sensory function Skin Warm and Dry  ECG demonstrates sinus rhythm  with frequent PACs at a rate of 55 intervals 23/10/47 Left axis deviation and poor R-wave progression Low-voltage limb leads  Loop recorder was interrogated demonstrating atrial fibrillation, posttermination pauses and sinus bradycardia  Assessment and  Plan  Atrial fibrillation-paroxysmal  Sinus bradycardia post termination pauses  Implantable loop recorder  First-degree AV block/left axis deviation  chronic HFpEF  History of TIA  Falls  Her falls and has not been associated with arrhythmia. She has had posttermination pauses; these have not been associated with symptoms. We discussed the potential role of pacing; this juncture we wi ll continue to observe her rhythm. These are automatically detected by way of her implantable loop recorder.  We spent more than 50% of our >45 min visit in face to face counseling regarding the above  She remains somewhat mottled volume overloaded; however, this is quite good for her and she is pleased with this.  It is her daughter's impression that some of her days when she is relatively hypotensive i.e. blood pressure 100 or so, that she is modestly weaker. We discussed nonpharmacological treatment options including the use of an abdominal binder. This is used with some trepidation because of its potential to augment venous return and thereby aggravate her congestive heart failure  We spent more than 50% of our >45 min visit in face to face counseling regarding the above

## 2015-03-27 LAB — TSH: TSH: 6.835 u[IU]/mL — ABNORMAL HIGH (ref 0.350–4.500)

## 2015-03-29 ENCOUNTER — Encounter: Payer: Self-pay | Admitting: Internal Medicine

## 2015-03-30 ENCOUNTER — Encounter: Payer: Self-pay | Admitting: Interventional Cardiology

## 2015-03-30 ENCOUNTER — Telehealth: Payer: Self-pay | Admitting: *Deleted

## 2015-03-30 NOTE — Telephone Encounter (Signed)
This encounter was created in error - please disregard.

## 2015-03-30 NOTE — Progress Notes (Signed)
Loop recorder 

## 2015-03-30 NOTE — Telephone Encounter (Signed)
Called patient's dtr to inform her of lab results earlier this morning. After reviewing with Dr Graciela HusbandsKlein he would like to ammend lab result statement and change to checking T3 & T4 with 3 month repeat TSH. Routed this information to PCP/[facility]. Dtr will call their office tomorrow to review this and arrange lab draw

## 2015-03-30 NOTE — Telephone Encounter (Signed)
New message     Returning a call to a nurse 

## 2015-03-31 ENCOUNTER — Telehealth: Payer: Self-pay

## 2015-03-31 ENCOUNTER — Other Ambulatory Visit: Payer: Self-pay

## 2015-03-31 DIAGNOSIS — I480 Paroxysmal atrial fibrillation: Secondary | ICD-10-CM

## 2015-03-31 MED ORDER — TORSEMIDE 20 MG PO TABS: ORAL_TABLET | ORAL | Status: DC

## 2015-03-31 NOTE — Telephone Encounter (Signed)
Refill done.  

## 2015-04-05 ENCOUNTER — Encounter: Payer: Self-pay | Admitting: Internal Medicine

## 2015-04-06 ENCOUNTER — Encounter: Payer: Medicare PPO | Admitting: Internal Medicine

## 2015-04-06 ENCOUNTER — Other Ambulatory Visit: Payer: Medicare PPO

## 2015-04-17 ENCOUNTER — Emergency Department (HOSPITAL_COMMUNITY): Payer: Medicare PPO

## 2015-04-17 ENCOUNTER — Observation Stay (HOSPITAL_COMMUNITY)
Admission: EM | Admit: 2015-04-17 | Discharge: 2015-04-17 | Disposition: A | Discharge status: Skilled Nursing Facility | Payer: Medicare PPO | Attending: Internal Medicine | Admitting: Internal Medicine

## 2015-04-17 ENCOUNTER — Encounter (HOSPITAL_COMMUNITY): Payer: Self-pay | Admitting: Emergency Medicine

## 2015-04-17 DIAGNOSIS — Z8673 Personal history of transient ischemic attack (TIA), and cerebral infarction without residual deficits: Secondary | ICD-10-CM | POA: Diagnosis not present

## 2015-04-17 DIAGNOSIS — I480 Paroxysmal atrial fibrillation: Secondary | ICD-10-CM

## 2015-04-17 DIAGNOSIS — W010XXA Fall on same level from slipping, tripping and stumbling without subsequent striking against object, initial encounter: Secondary | ICD-10-CM | POA: Insufficient documentation

## 2015-04-17 DIAGNOSIS — I6782 Cerebral ischemia: Secondary | ICD-10-CM | POA: Insufficient documentation

## 2015-04-17 DIAGNOSIS — S42291A Other displaced fracture of upper end of right humerus, initial encounter for closed fracture: Secondary | ICD-10-CM | POA: Diagnosis not present

## 2015-04-17 DIAGNOSIS — W19XXXD Unspecified fall, subsequent encounter: Secondary | ICD-10-CM

## 2015-04-17 DIAGNOSIS — Y9301 Activity, walking, marching and hiking: Secondary | ICD-10-CM | POA: Diagnosis not present

## 2015-04-17 DIAGNOSIS — I5032 Chronic diastolic (congestive) heart failure: Secondary | ICD-10-CM | POA: Diagnosis not present

## 2015-04-17 DIAGNOSIS — J309 Allergic rhinitis, unspecified: Secondary | ICD-10-CM | POA: Insufficient documentation

## 2015-04-17 DIAGNOSIS — S42292A Other displaced fracture of upper end of left humerus, initial encounter for closed fracture: Secondary | ICD-10-CM | POA: Diagnosis not present

## 2015-04-17 DIAGNOSIS — W19XXXA Unspecified fall, initial encounter: Secondary | ICD-10-CM

## 2015-04-17 DIAGNOSIS — Z885 Allergy status to narcotic agent status: Secondary | ICD-10-CM | POA: Insufficient documentation

## 2015-04-17 DIAGNOSIS — M25512 Pain in left shoulder: Secondary | ICD-10-CM

## 2015-04-17 DIAGNOSIS — J302 Other seasonal allergic rhinitis: Secondary | ICD-10-CM

## 2015-04-17 DIAGNOSIS — I10 Essential (primary) hypertension: Secondary | ICD-10-CM | POA: Diagnosis not present

## 2015-04-17 LAB — BASIC METABOLIC PANEL
ANION GAP: 10 (ref 5–15)
BUN: 27 mg/dL — ABNORMAL HIGH (ref 6–23)
CO2: 30 mmol/L (ref 19–32)
Calcium: 8.3 mg/dL — ABNORMAL LOW (ref 8.4–10.5)
Chloride: 97 mmol/L (ref 96–112)
Creatinine, Ser: 0.91 mg/dL (ref 0.50–1.10)
GFR calc Af Amer: 61 mL/min — ABNORMAL LOW (ref >90)
GFR, EST NON AFRICAN AMERICAN: 52 mL/min — AB (ref >90)
Glucose, Bld: 115 mg/dL — ABNORMAL HIGH (ref 70–99)
POTASSIUM: 3.9 mmol/L (ref 3.5–5.1)
Sodium: 137 mmol/L (ref 135–145)

## 2015-04-17 LAB — CBC
HEMATOCRIT: 31.7 % — AB (ref 36.0–46.0)
HEMOGLOBIN: 11 g/dL — AB (ref 12.0–15.0)
MCH: 32.2 pg (ref 26.0–34.0)
MCHC: 34.7 g/dL (ref 30.0–36.0)
MCV: 92.7 fL (ref 78.0–100.0)
Platelets: 202 10*3/uL (ref 150–400)
RBC: 3.42 MIL/uL — AB (ref 3.87–5.11)
RDW: 15.8 % — ABNORMAL HIGH (ref 11.5–15.5)
WBC: 9.8 10*3/uL (ref 4.0–10.5)

## 2015-04-17 MED ORDER — AMIODARONE HCL 200 MG PO TABS
200.0000 mg | ORAL_TABLET | Freq: Every day | ORAL | Status: DC
  Administered 2015-04-17: 200 mg via ORAL
  Filled 2015-04-17: qty 1

## 2015-04-17 MED ORDER — PREGABALIN 25 MG PO CAPS
50.0000 mg | ORAL_CAPSULE | Freq: Two times a day (BID) | ORAL | Status: DC
  Administered 2015-04-17: 50 mg via ORAL
  Filled 2015-04-17: qty 2

## 2015-04-17 MED ORDER — SODIUM CHLORIDE 0.9 % IJ SOLN
3.0000 mL | Freq: Two times a day (BID) | INTRAMUSCULAR | Status: DC
  Administered 2015-04-17: 3 mL via INTRAVENOUS

## 2015-04-17 MED ORDER — OXYCODONE HCL 5 MG PO TABS: 5.0000 mg | ORAL_TABLET | Freq: Four times a day (QID) | ORAL | Status: DC | PRN

## 2015-04-17 MED ORDER — PROGESTERONE MICRONIZED 100 MG PO CAPS: 100.0000 mg | ORAL_CAPSULE | ORAL | Status: DC

## 2015-04-17 MED ORDER — SELENIUM 50 MCG PO TABS: 50.0000 ug | ORAL_TABLET | Freq: Every day | ORAL | Status: DC

## 2015-04-17 MED ORDER — ESTRADIOL 1 MG PO TABS
0.5000 mg | ORAL_TABLET | Freq: Every day | ORAL | Status: DC
  Administered 2015-04-17: 0.5 mg via ORAL
  Filled 2015-04-17: qty 0.5

## 2015-04-17 MED ORDER — HYDROCODONE-ACETAMINOPHEN 5-325 MG PO TABS: 1.0000 | ORAL_TABLET | ORAL | Status: DC | PRN

## 2015-04-17 MED ORDER — TORSEMIDE 20 MG PO TABS
20.0000 mg | ORAL_TABLET | Freq: Every day | ORAL | Status: DC
  Administered 2015-04-17: 20 mg via ORAL
  Filled 2015-04-17: qty 1

## 2015-04-17 MED ORDER — APIXABAN 2.5 MG PO TABS
2.5000 mg | ORAL_TABLET | Freq: Two times a day (BID) | ORAL | Status: DC
  Administered 2015-04-17: 2.5 mg via ORAL
  Filled 2015-04-17 (×2): qty 1

## 2015-04-17 MED ORDER — PREGABALIN 50 MG PO CAPS: 50.0000 mg | ORAL_CAPSULE | Freq: Two times a day (BID) | ORAL | Status: DC

## 2015-04-17 MED ORDER — LORATADINE 10 MG PO TABS
10.0000 mg | ORAL_TABLET | Freq: Every day | ORAL | Status: DC
  Administered 2015-04-17: 10 mg via ORAL
  Filled 2015-04-17: qty 1

## 2015-04-17 MED ORDER — B COMPLEX PO TABS: 0.5000 | ORAL_TABLET | Freq: Three times a day (TID) | ORAL | Status: DC

## 2015-04-17 MED ORDER — FENTANYL CITRATE (PF) 100 MCG/2ML IJ SOLN: 50.0000 ug | INTRAMUSCULAR | Status: DC | PRN

## 2015-04-17 MED ORDER — ADULT MULTIVITAMIN W/MINERALS CH
1.0000 | ORAL_TABLET | Freq: Every day | ORAL | Status: DC
  Administered 2015-04-17: 1 via ORAL
  Filled 2015-04-17: qty 1

## 2015-04-17 NOTE — Clinical Social Work Placement (Signed)
   CLINICAL SOCIAL WORK PLACEMENT  NOTE  Date:  04/17/2015  Patient Details  Name: Melanie Chancellorrene H Taliercio MRN: [id number] Date of Birth: 1919-04-11  Clinical Social Work is seeking post-discharge placement for this patient at the Skilled  Nursing Facility level of care (*CSW will initial, date and re-position this form in  chart as items are completed):  Yes   Patient/family provided with [facility] Clinical Social Work Department's list of facilities offering this level of care within the geographic area requested by the patient (or if unable, by the patient's family).  Yes   Patient/family informed of their freedom to choose among providers that offer the needed level of care, that participate in Medicare, Medicaid or managed care program needed by the patient, have an available bed and are willing to accept the patient.  Yes   Patient/family informed of [facility]'s ownership interest in [facility] and [facility], as well as of the fact that they are under no obligation to receive care at these facilities.  PASRR submitted to EDS on 04/17/15     PASRR number received on 04/17/15     Existing PASRR number confirmed on       FL2 transmitted to all facilities in geographic area requested by pt/family on 04/17/15     FL2 transmitted to all facilities within larger geographic area on       Patient informed that his/her managed care company has contracts with or will negotiate with certain facilities, including the following:   (YES)     Yes   Patient/family informed of bed offers received.  Patient chooses bed at  [facility] )     Physician recommends and patient chooses bed at      Patient to be transferred to  [facility] on 04/17/15.  Patient to be transferred to facility by  (Pt's dtr, Toniann FailWendy to transport pt back to facility.)     Patient family notified on 04/17/15 of transfer.  Name of family member notified:   (Pt's dtr, Selena BattenWendy McNeil)      PHYSICIAN Please sign FL2, Please prepare prescriptions     Additional Comment:    _______________________________________________ Derenda FennelBashira Lamyah Creed, MSW, LCSWA [phone] 04/17/2015 11:14 AM

## 2015-04-17 NOTE — ED Provider Notes (Signed)
This chart was scribed for Melanie AlbeIva Niurka Benecke, MD by Bronson CurbJacqueline Melvin, ED Scribe. This patient was seen in room A05C/A05C and the patient's care was started at 1:20 AM.   HPI Comments: Melanie Obrien is a 81 y.o. female who presents to the Emergency Department complaining of a fall that occurred PTA. Patient was ambulating with walker on a flat surface when she suspects her shoe "caught" on the floor causing her to fall face forward. Patient reports impact primarily on the left side, but denies LOC. There is associated sudden onset, constant, moderate, left shoulder pain, left hip pain, and pain to the left jaw. She denies any other injuries. Patient is right hand dominant.  Patient is not established with an orthopedist.  PHYSICAL EXAM: MUSC: Diffuse swelling of the left anterior shoulder. Pain with swelling of left hip HENT: Tenderness of the left cheek area with mild swelling but no bruising NECK: Diffuse tenderness midline and in the paraspinous muscles. BACK: tenderness of the upper thoracic spine, but rest of thoracic spine and lumbar are nontender.     COORDINATION OF CARE: At 0129 Discussed treatment plan with patient which includes CT and imaging. Patient agrees.      Medical screening examination/treatment/procedure(s) were conducted as a shared visit with non-physician practitioner(s) and myself.  I personally evaluated the patient during the encounter.   EKG Interpretation   Date/Time:  Saturday April 17 2015 01:47:01 EDT Ventricular Rate:  95 PR Interval:    QRS Duration: 112 QT Interval:  394 QTC Calculation: 495 R Axis:   -81 Text Interpretation:  Age not entered, assumed to be  81 years old for  purpose of ECG interpretation Atrial flutter with predominant 3:1 AV block  Left anterior fascicular block Abnormal R-wave progression, late  transition Borderline T abnormalities, anterior leads Borderline prolonged  QT interval Artifact in lead(s) I II aVR aVL aVF No significant  change  since last tracing 20 Jan 2015 Confirmed by [facility]  MD-I, Ellyce Lafevers ([id number]) on  04/17/2015 1:57:50 AM      Melanie AlbeIva Braxtyn Dorff, MD, Concha PyoFACEP    Rasean Joos, MD 04/17/15 [phone]

## 2015-04-17 NOTE — ED Notes (Signed)
Report attempted 

## 2015-04-17 NOTE — Discharge Instructions (Signed)
Use your sling full time when out of bed.  Sleeping in a recliner is often the easiest way to sleep with this injury  You may remove your sling for bathing or when sitting/laying down.

## 2015-04-17 NOTE — Clinical Social Work Note (Signed)
Clinical Social Worker has faxed patient's clinicals several times through 2 different fax machines and [facility] reported [facility] are not receiving faxes. Facility stated although they are not received information pt is welcomed to return as a skilled resident. [facility] Medicare insurance authorization is to be initiated by facility.   CSW has reported information to pt's daughter, Toniann FailWendy and dtr is agreeable to plan. CSW also notified MD. MD to sign prescriptions and FL-2 on shadow chart.   Clinical Social Worker facilitated patient discharge including contacting patient family and facility to confirm patient discharge plans.  Clinical information faxed to facility and family agreeable with plan. Patient's [facility].  RN to call report prior to discharge to [phone] and request to be transferred to skilled facility. Report to be given to Platoharlene or [name] at [facility]. Patient will be admitted into room 33.  DC to be prepared and placed on chart for MD signature.   Clinical Social Worker will sign off for now as social work intervention is no longer needed. Please consult us again if new need arises.  Derenda FennelBashira Sivan Cuello, MSW, LCSWA [phone] 04/17/2015 1:56 PM

## 2015-04-17 NOTE — Progress Notes (Signed)
Pt daughter handed discharge packet provided by CSW to give to facility. Arabella MerlesP. Amo Sneha Willig RN.

## 2015-04-17 NOTE — ED Notes (Signed)
Pt states she does not want any pain meds at this time, was instructed to let staff know if she changes her mind.

## 2015-04-17 NOTE — Progress Notes (Signed)
UR completed 

## 2015-04-17 NOTE — H&P (Addendum)
[facility] History and Physical  CADI RHINEHART EXB:[id number] DOB: 08-08-1919 DOA: 04/17/2015  Referring physician: EDP PCP: Gaye Alken, MD   Chief Complaint: Fall   HPI: Melanie Obrien is a 81 y.o. female who tripped while walking with walker and fell.  Landed on R side.  Right sided shoulder pain and deformity after fall, worse with movement better with rest.  No syncope related to fall, patient believes she tripped.  No LOC, is having a cardiac work up done by a cardiologist for low BP and lightheadedness episodes as outpatient anyhow.  Review of Systems: Systems reviewed.  As above, otherwise negative  Past Medical History  Diagnosis Date  . Sinus tachycardia   . Pneumonia   . Scoliosis   . TIA (transient ischemic attack)   . Trigeminal neuralgia of left side of face 12/13/2014  . Hot flashes 12/13/2014  . Acute on chronic diastolic heart failure   . Atrial fibrillation   . Lower extremity edema   . CHF (congestive heart failure)   . Shortness of breath dyspnea    Past Surgical History  Procedure Laterality Date  . Tonsillectomy     Social History:  reports that she has never smoked. She has never used smokeless tobacco. She reports that she does not drink alcohol or use illicit drugs.  Allergies  Allergen Reactions  . Codeine Other (See Comments)    unknown  . Levaquin [Levofloxacin In D5w] Itching  . Loratadine     TACHYCARDIA  . Xylocaine [Lidocaine Hcl] Other (See Comments)    Uncontrolled shaking    Family History  Problem Relation Age of Onset  . Hypertension Father      Prior to Admission medications   Medication Sig Start Date End Date Taking? Authorizing Provider  amiodarone (PACERONE) 200 MG tablet Take 200 mg by mouth daily.   Yes Historical Provider, MD  apixaban (ELIQUIS) 2.5 MG TABS tablet Take 1 tablet (2.5 mg total) by mouth 2 (two) times daily. 01/25/15  Yes Duke Salvia, MD  Ascorbic Acid (VITAMIN C PO) Take 1 g by  mouth 3 (three) times daily.    Yes Historical Provider, MD  b complex vitamins tablet Take 0.5 tablets by mouth 3 (three) times daily.    Yes Historical Provider, MD  BETA CAROTENE PO Take 1 tablet by mouth daily.   Yes Historical Provider, MD  CALCIUM PO Take 1 tablet by mouth daily.   Yes Historical Provider, MD  Cholecalciferol (VITAMIN D-3 PO) Take 1 tablet by mouth daily.   Yes Historical Provider, MD  CHROMIUM PO Take 1 tablet by mouth daily.   Yes Historical Provider, MD  Cyanocobalamin (VITAMIN B 12 PO) Take 1 tablet by mouth daily.   Yes Historical Provider, MD  estradiol (ESTRACE) 1 MG tablet Take 0.5 mg by mouth daily.   Yes Historical Provider, MD  fexofenadine (ALLEGRA) 180 MG tablet Take 180 mg by mouth daily.   Yes Historical Provider, MD  MAGNESIUM PO Take 1 tablet by mouth daily.   Yes Historical Provider, MD  Multiple Vitamin (MULTIVITAMIN WITH MINERALS) TABS tablet Take 1 tablet by mouth daily.   Yes Historical Provider, MD  pregabalin (LYRICA) 25 MG capsule Take 50-75 mg by mouth 2 (two) times daily. Take 75 mg every morning and take 50 mg every evening   Yes Historical Provider, MD  progesterone (PROMETRIUM) 100 MG capsule Take 100 mg by mouth every other day.    Yes Historical Provider, MD  selenium 50 MCG TABS tablet Take 50 mcg by mouth daily.   Yes Historical Provider, MD  torsemide (DEMADEX) 20 MG tablet Pt takes 1 tab daily and may take 1 extra daily if weight gain of 2 lbs or more Patient taking differently: Take 20 mg by mouth daily. Pt takes 1 tab daily and may take 1 extra daily if weight gain of 2 lbs or more 03/31/15  Yes Corky Crafts, MD  VITAMIN A PO Take 1 tablet by mouth daily.   Yes Historical Provider, MD  VITAMIN E PO Take 1 tablet by mouth daily.   Yes Historical Provider, MD   Physical Exam: Filed Vitals:   04/17/15 0400  BP: 103/57  Pulse: 114  Temp:   Resp:     BP 103/57 mmHg  Pulse 114  Temp(Src) 98.4 F (36.9 C) (Oral)  Resp 18   SpO2 96%  General Appearance:    Alert, oriented, no distress, appears stated age, slightly hard of hearing.  Head:    Normocephalic, atraumatic  Eyes:    PERRL, EOMI, sclera non-icteric        Nose:   Nares without drainage or epistaxis. Mucosa, turbinates normal  Throat:   Moist mucous membranes. Oropharynx without erythema or exudate.  Neck:   Supple. No carotid bruits.  No thyromegaly.  No lymphadenopathy.   Back:     No CVA tenderness, no spinal tenderness  Lungs:     Clear to auscultation bilaterally, without wheezes, rhonchi or rales  Chest wall:    No tenderness to palpitation  Heart:    Regular rate and rhythm without murmurs, gallops, rubs  Abdomen:     Soft, non-tender, nondistended, normal bowel sounds, no organomegaly  Genitalia:    deferred  Rectal:    deferred  Extremities:   No clubbing, cyanosis or edema.  Right arm propped up with pillows.  Pulses:   2+ and symmetric all extremities  Skin:   Skin color, texture, turgor normal, no rashes or lesions  Lymph nodes:   Cervical, supraclavicular, and axillary nodes normal  Neurologic:   CNII-XII intact. Normal strength, sensation and reflexes      throughout    Labs on Admission:  Basic Metabolic Panel:  Recent Labs Lab 04/17/15 0137  NA 137  K 3.9  CL 97  CO2 30  GLUCOSE 115*  BUN 27*  CREATININE 0.91  CALCIUM 8.3*   Liver Function Tests: No results for input(s): AST, ALT, ALKPHOS, BILITOT, PROT, ALBUMIN in the last 168 hours. No results for input(s): LIPASE, AMYLASE in the last 168 hours. No results for input(s): AMMONIA in the last 168 hours. CBC:  Recent Labs Lab 04/17/15 0137  WBC 9.8  HGB 11.0*  HCT 31.7*  MCV 92.7  PLT 202   Cardiac Enzymes: No results for input(s): CKTOTAL, CKMB, CKMBINDEX, TROPONINI in the last 168 hours.  BNP (last 3 results)  Recent Labs  12/13/14 1405 12/30/14 1619 02/04/15 1446  PROBNP 2339.0* 395.0* 330.0*   CBG: No results for input(s): GLUCAP in the last  168 hours.  Radiological Exams on Admission: Dg Chest 1 View  04/17/2015   CLINICAL DATA:  Status post fall; concern for chest injury. Initial encounter.  EXAM: CHEST  1 VIEW  COMPARISON:  Chest radiograph performed 01/12/2015  FINDINGS: The lungs are well-aerated and clear. There is no evidence of focal opacification, pleural effusion or pneumothorax.  The cardiomediastinal silhouette is borderline normal in size. There is a comminuted fracture involving  the left humeral head and neck, with mild impaction and displacement of a greater tuberosity fragment.  IMPRESSION: 1. Comminuted fracture involving the left humeral head and neck, with mild impaction and displacement of a greater tuberosity fragment. 2. No acute cardiopulmonary process seen. No displaced rib fractures identified.   Electronically Signed   By: Roanna Raider M.D.   On: 04/17/2015 02:37   Dg Thoracic Spine 2 View  04/17/2015   CLINICAL DATA:  Mid back pain after a fall.  EXAM: THORACIC SPINE - 2 VIEW  COMPARISON:  Chest x-rays dated 01/12/2015 and 12/24/2014  FINDINGS: There is no fracture or bone destruction. There is a thoracolumbar scoliosis which is unchanged. This is most severe in the lumbar spine.  IMPRESSION: No acute abnormality of the thoracic spine.   Electronically Signed   By: Francene Boyers M.D.   On: 04/17/2015 02:36   Ct Head Wo Contrast  04/17/2015   CLINICAL DATA:  Status post fall. Fell face forward, impacting mainly on the left. Left jaw pain. Concern for head or cervical spine injury.  EXAM: CT HEAD WITHOUT CONTRAST  CT MAXILLOFACIAL WITHOUT CONTRAST  CT CERVICAL SPINE WITHOUT CONTRAST  TECHNIQUE: Multidetector CT imaging of the head, cervical spine, and maxillofacial structures were performed using the standard protocol without intravenous contrast. Multiplanar CT image reconstructions of the cervical spine and maxillofacial structures were also generated.  COMPARISON:  None.  FINDINGS: CT HEAD FINDINGS  There is no  evidence of acute infarction, mass lesion, or intra- or extra-axial hemorrhage on CT.  Prominence of the ventricles and sulci reflects mild cortical volume loss. Mild cerebellar atrophy is noted. Scattered periventricular and subcortical white matter change likely reflects small vessel ischemic microangiopathy.  The brainstem and fourth ventricle are within normal limits. The basal ganglia are unremarkable in appearance. The cerebral hemispheres demonstrate grossly normal gray-white differentiation. No mass effect or midline shift is seen.  There is no evidence of fracture; visualized osseous structures are unremarkable in appearance. The orbits are within normal limits. The paranasal sinuses and right mastoid air cells are well-aerated. There is mild partial opacification of the left mastoid air cells. No significant soft tissue abnormalities are seen.  CT MAXILLOFACIAL FINDINGS  There is no evidence of fracture or dislocation. The maxilla and mandible appear intact. The nasal bone is unremarkable in appearance. The visualized dentition demonstrates no acute abnormality.  The orbits are intact bilaterally. The visualized paranasal sinuses and mastoid air cells are well-aerated.  Calcification is noted at the carotid bifurcations bilaterally. The parapharyngeal fat planes are preserved. The nasopharynx, oropharynx and hypopharynx are unremarkable in appearance. The visualized portions of the valleculae and piriform sinuses are grossly unremarkable.  The parotid and submandibular glands are within normal limits. No cervical lymphadenopathy is seen.  CT CERVICAL SPINE FINDINGS  There is no evidence of acute fracture or subluxation. There is grade 1 anterolisthesis of C4 on C5, and grade 1 anterolisthesis of C6 on C7, with intervertebral disc space loss at C5-C6. This reflects underlying facet disease. Prevertebral soft tissues are within normal limits. Mild degenerative change is noted at the dens.  The visualized  portions of the thyroid gland are unremarkable in appearance. The visualized lung apices are clear. No significant soft tissue abnormalities are seen.  IMPRESSION: 1. No evidence of traumatic intracranial injury or fracture. 2. No evidence of acute fracture or subluxation along the cervical spine. 3. No evidence of fracture or dislocation with regard to the maxillofacial structures. 4. Mild cortical  volume loss and scattered small vessel ischemic microangiopathy. 5. Mild partial opacification of the left mastoid air cells. 6. Calcification at the carotid bifurcations bilaterally. 7. Mild degenerative change noted along the cervical spine.   Electronically Signed   By: Roanna Raider M.D.   On: 04/17/2015 03:52   Ct Cervical Spine Wo Contrast  04/17/2015   CLINICAL DATA:  Status post fall. Fell face forward, impacting mainly on the left. Left jaw pain. Concern for head or cervical spine injury.  EXAM: CT HEAD WITHOUT CONTRAST  CT MAXILLOFACIAL WITHOUT CONTRAST  CT CERVICAL SPINE WITHOUT CONTRAST  TECHNIQUE: Multidetector CT imaging of the head, cervical spine, and maxillofacial structures were performed using the standard protocol without intravenous contrast. Multiplanar CT image reconstructions of the cervical spine and maxillofacial structures were also generated.  COMPARISON:  None.  FINDINGS: CT HEAD FINDINGS  There is no evidence of acute infarction, mass lesion, or intra- or extra-axial hemorrhage on CT.  Prominence of the ventricles and sulci reflects mild cortical volume loss. Mild cerebellar atrophy is noted. Scattered periventricular and subcortical white matter change likely reflects small vessel ischemic microangiopathy.  The brainstem and fourth ventricle are within normal limits. The basal ganglia are unremarkable in appearance. The cerebral hemispheres demonstrate grossly normal gray-white differentiation. No mass effect or midline shift is seen.  There is no evidence of fracture; visualized  osseous structures are unremarkable in appearance. The orbits are within normal limits. The paranasal sinuses and right mastoid air cells are well-aerated. There is mild partial opacification of the left mastoid air cells. No significant soft tissue abnormalities are seen.  CT MAXILLOFACIAL FINDINGS  There is no evidence of fracture or dislocation. The maxilla and mandible appear intact. The nasal bone is unremarkable in appearance. The visualized dentition demonstrates no acute abnormality.  The orbits are intact bilaterally. The visualized paranasal sinuses and mastoid air cells are well-aerated.  Calcification is noted at the carotid bifurcations bilaterally. The parapharyngeal fat planes are preserved. The nasopharynx, oropharynx and hypopharynx are unremarkable in appearance. The visualized portions of the valleculae and piriform sinuses are grossly unremarkable.  The parotid and submandibular glands are within normal limits. No cervical lymphadenopathy is seen.  CT CERVICAL SPINE FINDINGS  There is no evidence of acute fracture or subluxation. There is grade 1 anterolisthesis of C4 on C5, and grade 1 anterolisthesis of C6 on C7, with intervertebral disc space loss at C5-C6. This reflects underlying facet disease. Prevertebral soft tissues are within normal limits. Mild degenerative change is noted at the dens.  The visualized portions of the thyroid gland are unremarkable in appearance. The visualized lung apices are clear. No significant soft tissue abnormalities are seen.  IMPRESSION: 1. No evidence of traumatic intracranial injury or fracture. 2. No evidence of acute fracture or subluxation along the cervical spine. 3. No evidence of fracture or dislocation with regard to the maxillofacial structures. 4. Mild cortical volume loss and scattered small vessel ischemic microangiopathy. 5. Mild partial opacification of the left mastoid air cells. 6. Calcification at the carotid bifurcations bilaterally. 7. Mild  degenerative change noted along the cervical spine.   Electronically Signed   By: Roanna Raider M.D.   On: 04/17/2015 03:52   Dg Shoulder Left  04/17/2015   CLINICAL DATA:  Status post fall from standing, with injury to the left shoulder. Left shoulder pain, swelling and bruising. Limited range of motion. Initial encounter.  EXAM: LEFT SHOULDER - 2+ VIEW  COMPARISON:  None.  FINDINGS: There is  a comminuted fracture involving the left humeral head and neck, with a displaced greater tuberosity fragment. There is no evidence of dislocation. Humeral head fragments remain seated at the glenoid fossa.  No additional fractures are seen. The visualized portions of the lungs are clear. The left acromioclavicular joint is unremarkable in appearance.  IMPRESSION: Comminuted fracture involving the left humeral head and neck, with displaced greater tuberosity fragment. No evidence of dislocation.   Electronically Signed   By: Roanna Raider M.D.   On: 04/17/2015 01:28   Dg Hip Unilat With Pelvis 2-3 Views Left  04/17/2015   CLINICAL DATA:  Left hip pain after a fall.  EXAM: LEFT HIP (WITH PELVIS) 2-3 VIEWS  COMPARISON:  None.  FINDINGS: There is no fracture or dislocation. There are slight osteophytes on the left femoral head and on the acetabulum. Fairly severe degenerative disc disease in the lumbar spine with a scoliosis.  IMPRESSION: No acute abnormalities.   Electronically Signed   By: Francene Boyers M.D.   On: 04/17/2015 02:34   Ct Maxillofacial Wo Cm  04/17/2015   CLINICAL DATA:  Status post fall. Fell face forward, impacting mainly on the left. Left jaw pain. Concern for head or cervical spine injury.  EXAM: CT HEAD WITHOUT CONTRAST  CT MAXILLOFACIAL WITHOUT CONTRAST  CT CERVICAL SPINE WITHOUT CONTRAST  TECHNIQUE: Multidetector CT imaging of the head, cervical spine, and maxillofacial structures were performed using the standard protocol without intravenous contrast. Multiplanar CT image reconstructions of the  cervical spine and maxillofacial structures were also generated.  COMPARISON:  None.  FINDINGS: CT HEAD FINDINGS  There is no evidence of acute infarction, mass lesion, or intra- or extra-axial hemorrhage on CT.  Prominence of the ventricles and sulci reflects mild cortical volume loss. Mild cerebellar atrophy is noted. Scattered periventricular and subcortical white matter change likely reflects small vessel ischemic microangiopathy.  The brainstem and fourth ventricle are within normal limits. The basal ganglia are unremarkable in appearance. The cerebral hemispheres demonstrate grossly normal gray-white differentiation. No mass effect or midline shift is seen.  There is no evidence of fracture; visualized osseous structures are unremarkable in appearance. The orbits are within normal limits. The paranasal sinuses and right mastoid air cells are well-aerated. There is mild partial opacification of the left mastoid air cells. No significant soft tissue abnormalities are seen.  CT MAXILLOFACIAL FINDINGS  There is no evidence of fracture or dislocation. The maxilla and mandible appear intact. The nasal bone is unremarkable in appearance. The visualized dentition demonstrates no acute abnormality.  The orbits are intact bilaterally. The visualized paranasal sinuses and mastoid air cells are well-aerated.  Calcification is noted at the carotid bifurcations bilaterally. The parapharyngeal fat planes are preserved. The nasopharynx, oropharynx and hypopharynx are unremarkable in appearance. The visualized portions of the valleculae and piriform sinuses are grossly unremarkable.  The parotid and submandibular glands are within normal limits. No cervical lymphadenopathy is seen.  CT CERVICAL SPINE FINDINGS  There is no evidence of acute fracture or subluxation. There is grade 1 anterolisthesis of C4 on C5, and grade 1 anterolisthesis of C6 on C7, with intervertebral disc space loss at C5-C6. This reflects underlying facet  disease. Prevertebral soft tissues are within normal limits. Mild degenerative change is noted at the dens.  The visualized portions of the thyroid gland are unremarkable in appearance. The visualized lung apices are clear. No significant soft tissue abnormalities are seen.  IMPRESSION: 1. No evidence of traumatic intracranial injury or fracture. 2.  No evidence of acute fracture or subluxation along the cervical spine. 3. No evidence of fracture or dislocation with regard to the maxillofacial structures. 4. Mild cortical volume loss and scattered small vessel ischemic microangiopathy. 5. Mild partial opacification of the left mastoid air cells. 6. Calcification at the carotid bifurcations bilaterally. 7. Mild degenerative change noted along the cervical spine.   Electronically Signed   By: Roanna RaiderJeffery  Chang M.D.   On: 04/17/2015 03:52    EKG: Independently reviewed.  Assessment/Plan Active Problems:   Humeral head fracture   1. Humeral head fracture - 1. Likely non-operative, Dr. Eulah PontMurphy consulted by EDP to see patient in AM. 2. Will admit patient since she lives home alone and uses walker 3. PT/OT 4. SW consult, will need facilitation of placement in her facility's ALF part. 5. norco for pain if needed 6. Will go ahead and let patient eat and keep her on eliquis since likely non-op.    Code Status: Full Code  Family Communication: No family in room Disposition Plan: Admit to obs   Time spent: 30 min  Georgie Haque M. [facility] Pager [phone]  If 7AM-7PM, please contact the day team taking care of the patient [email or web address] Password [email or web address] 04/17/2015, 4:38 AM

## 2015-04-17 NOTE — Consult Note (Signed)
ORTHOPAEDIC CONSULTATION  REQUESTING PHYSICIAN: Barton Dubois, MD  Chief Complaint: Left shoulder pain  HPI: Melanie Obrien is a 80 y.o. female who complains of a mechanical fall onto her left side she bruised her left hip and has pain in her left shoulder with any motion. She denies any numbness or tingling.  Past Medical History  Diagnosis Date  . Sinus tachycardia   . Pneumonia   . Scoliosis   . TIA (transient ischemic attack)   . Trigeminal neuralgia of left side of face 12/13/2014  . Hot flashes 12/13/2014  . Acute on chronic diastolic heart failure   . Atrial fibrillation   . Lower extremity edema   . CHF (congestive heart failure)   . Shortness of breath dyspnea    Past Surgical History  Procedure Laterality Date  . Tonsillectomy     History   Social History  . Marital Status: Widowed    Spouse Name: N/A  . Number of Children: N/A  . Years of Education: N/A   Social History Main Topics  . Smoking status: Never Smoker   . Smokeless tobacco: Never Used  . Alcohol Use: No  . Drug Use: No  . Sexual Activity: Not on file   Other Topics Concern  . None   Social History Narrative   Family History  Problem Relation Age of Onset  . Hypertension Father    Allergies  Allergen Reactions  . Codeine Other (See Comments)    unknown  . Levaquin [Levofloxacin In D5w] Itching  . Loratadine     TACHYCARDIA  . Xylocaine [Lidocaine Hcl] Other (See Comments)    Uncontrolled shaking   Prior to Admission medications   Medication Sig Start Date End Date Taking? Authorizing Provider  amiodarone (PACERONE) 200 MG tablet Take 200 mg by mouth daily.   Yes Historical Provider, MD  apixaban (ELIQUIS) 2.5 MG TABS tablet Take 1 tablet (2.5 mg total) by mouth 2 (two) times daily. 01/25/15  Yes Deboraha Sprang, MD  Ascorbic Acid (VITAMIN C PO) Take 1 g by mouth 3 (three) times daily.    Yes Historical Provider, MD  b complex vitamins tablet Take 0.5 tablets by mouth 3  (three) times daily.    Yes Historical Provider, MD  BETA CAROTENE PO Take 1 tablet by mouth daily.   Yes Historical Provider, MD  CALCIUM PO Take 1 tablet by mouth daily.   Yes Historical Provider, MD  Cholecalciferol (VITAMIN D-3 PO) Take 1 tablet by mouth daily.   Yes Historical Provider, MD  CHROMIUM PO Take 1 tablet by mouth daily.   Yes Historical Provider, MD  Cyanocobalamin (VITAMIN B 12 PO) Take 1 tablet by mouth daily.   Yes Historical Provider, MD  estradiol (ESTRACE) 1 MG tablet Take 0.5 mg by mouth daily.   Yes Historical Provider, MD  fexofenadine (ALLEGRA) 180 MG tablet Take 180 mg by mouth daily.   Yes Historical Provider, MD  MAGNESIUM PO Take 1 tablet by mouth daily.   Yes Historical Provider, MD  Multiple Vitamin (MULTIVITAMIN WITH MINERALS) TABS tablet Take 1 tablet by mouth daily.   Yes Historical Provider, MD  pregabalin (LYRICA) 25 MG capsule Take 50-75 mg by mouth 2 (two) times daily. Take 75 mg every morning and take 50 mg every evening   Yes Historical Provider, MD  progesterone (PROMETRIUM) 100 MG capsule Take 100 mg by mouth every other day.    Yes Historical Provider, MD  selenium 50 MCG  TABS tablet Take 50 mcg by mouth daily.   Yes Historical Provider, MD  torsemide (DEMADEX) 20 MG tablet Pt takes 1 tab daily and may take 1 extra daily if weight gain of 2 lbs or more Patient taking differently: Take 20 mg by mouth daily. Pt takes 1 tab daily and may take 1 extra daily if weight gain of 2 lbs or more 03/31/15  Yes Jettie Booze, MD  VITAMIN A PO Take 1 tablet by mouth daily.   Yes Historical Provider, MD  VITAMIN E PO Take 1 tablet by mouth daily.   Yes Historical Provider, MD   Dg Chest 1 View  04/17/2015   CLINICAL DATA:  Status post fall; concern for chest injury. Initial encounter.  EXAM: CHEST  1 VIEW  COMPARISON:  Chest radiograph performed 01/12/2015  FINDINGS: The lungs are well-aerated and clear. There is no evidence of focal opacification, pleural  effusion or pneumothorax.  The cardiomediastinal silhouette is borderline normal in size. There is a comminuted fracture involving the left humeral head and neck, with mild impaction and displacement of a greater tuberosity fragment.  IMPRESSION: 1. Comminuted fracture involving the left humeral head and neck, with mild impaction and displacement of a greater tuberosity fragment. 2. No acute cardiopulmonary process seen. No displaced rib fractures identified.   Electronically Signed   By: Garald Balding M.D.   On: 04/17/2015 02:37   Dg Thoracic Spine 2 View  04/17/2015   CLINICAL DATA:  Mid back pain after a fall.  EXAM: THORACIC SPINE - 2 VIEW  COMPARISON:  Chest x-rays dated 01/12/2015 and 12/24/2014  FINDINGS: There is no fracture or bone destruction. There is a thoracolumbar scoliosis which is unchanged. This is most severe in the lumbar spine.  IMPRESSION: No acute abnormality of the thoracic spine.   Electronically Signed   By: Lorriane Shire M.D.   On: 04/17/2015 02:36   Ct Head Wo Contrast  04/17/2015   CLINICAL DATA:  Status post fall. Fell face forward, impacting mainly on the left. Left jaw pain. Concern for head or cervical spine injury.  EXAM: CT HEAD WITHOUT CONTRAST  CT MAXILLOFACIAL WITHOUT CONTRAST  CT CERVICAL SPINE WITHOUT CONTRAST  TECHNIQUE: Multidetector CT imaging of the head, cervical spine, and maxillofacial structures were performed using the standard protocol without intravenous contrast. Multiplanar CT image reconstructions of the cervical spine and maxillofacial structures were also generated.  COMPARISON:  None.  FINDINGS: CT HEAD FINDINGS  There is no evidence of acute infarction, mass lesion, or intra- or extra-axial hemorrhage on CT.  Prominence of the ventricles and sulci reflects mild cortical volume loss. Mild cerebellar atrophy is noted. Scattered periventricular and subcortical white matter change likely reflects small vessel ischemic microangiopathy.  The brainstem and  fourth ventricle are within normal limits. The basal ganglia are unremarkable in appearance. The cerebral hemispheres demonstrate grossly normal gray-white differentiation. No mass effect or midline shift is seen.  There is no evidence of fracture; visualized osseous structures are unremarkable in appearance. The orbits are within normal limits. The paranasal sinuses and right mastoid air cells are well-aerated. There is mild partial opacification of the left mastoid air cells. No significant soft tissue abnormalities are seen.  CT MAXILLOFACIAL FINDINGS  There is no evidence of fracture or dislocation. The maxilla and mandible appear intact. The nasal bone is unremarkable in appearance. The visualized dentition demonstrates no acute abnormality.  The orbits are intact bilaterally. The visualized paranasal sinuses and mastoid air cells are  well-aerated.  Calcification is noted at the carotid bifurcations bilaterally. The parapharyngeal fat planes are preserved. The nasopharynx, oropharynx and hypopharynx are unremarkable in appearance. The visualized portions of the valleculae and piriform sinuses are grossly unremarkable.  The parotid and submandibular glands are within normal limits. No cervical lymphadenopathy is seen.  CT CERVICAL SPINE FINDINGS  There is no evidence of acute fracture or subluxation. There is grade 1 anterolisthesis of C4 on C5, and grade 1 anterolisthesis of C6 on C7, with intervertebral disc space loss at C5-C6. This reflects underlying facet disease. Prevertebral soft tissues are within normal limits. Mild degenerative change is noted at the dens.  The visualized portions of the thyroid gland are unremarkable in appearance. The visualized lung apices are clear. No significant soft tissue abnormalities are seen.  IMPRESSION: 1. No evidence of traumatic intracranial injury or fracture. 2. No evidence of acute fracture or subluxation along the cervical spine. 3. No evidence of fracture or  dislocation with regard to the maxillofacial structures. 4. Mild cortical volume loss and scattered small vessel ischemic microangiopathy. 5. Mild partial opacification of the left mastoid air cells. 6. Calcification at the carotid bifurcations bilaterally. 7. Mild degenerative change noted along the cervical spine.   Electronically Signed   By: Garald Balding M.D.   On: 04/17/2015 03:52   Ct Cervical Spine Wo Contrast  04/17/2015   CLINICAL DATA:  Status post fall. Fell face forward, impacting mainly on the left. Left jaw pain. Concern for head or cervical spine injury.  EXAM: CT HEAD WITHOUT CONTRAST  CT MAXILLOFACIAL WITHOUT CONTRAST  CT CERVICAL SPINE WITHOUT CONTRAST  TECHNIQUE: Multidetector CT imaging of the head, cervical spine, and maxillofacial structures were performed using the standard protocol without intravenous contrast. Multiplanar CT image reconstructions of the cervical spine and maxillofacial structures were also generated.  COMPARISON:  None.  FINDINGS: CT HEAD FINDINGS  There is no evidence of acute infarction, mass lesion, or intra- or extra-axial hemorrhage on CT.  Prominence of the ventricles and sulci reflects mild cortical volume loss. Mild cerebellar atrophy is noted. Scattered periventricular and subcortical white matter change likely reflects small vessel ischemic microangiopathy.  The brainstem and fourth ventricle are within normal limits. The basal ganglia are unremarkable in appearance. The cerebral hemispheres demonstrate grossly normal gray-white differentiation. No mass effect or midline shift is seen.  There is no evidence of fracture; visualized osseous structures are unremarkable in appearance. The orbits are within normal limits. The paranasal sinuses and right mastoid air cells are well-aerated. There is mild partial opacification of the left mastoid air cells. No significant soft tissue abnormalities are seen.  CT MAXILLOFACIAL FINDINGS  There is no evidence of fracture  or dislocation. The maxilla and mandible appear intact. The nasal bone is unremarkable in appearance. The visualized dentition demonstrates no acute abnormality.  The orbits are intact bilaterally. The visualized paranasal sinuses and mastoid air cells are well-aerated.  Calcification is noted at the carotid bifurcations bilaterally. The parapharyngeal fat planes are preserved. The nasopharynx, oropharynx and hypopharynx are unremarkable in appearance. The visualized portions of the valleculae and piriform sinuses are grossly unremarkable.  The parotid and submandibular glands are within normal limits. No cervical lymphadenopathy is seen.  CT CERVICAL SPINE FINDINGS  There is no evidence of acute fracture or subluxation. There is grade 1 anterolisthesis of C4 on C5, and grade 1 anterolisthesis of C6 on C7, with intervertebral disc space loss at C5-C6. This reflects underlying facet disease. Prevertebral soft tissues are  within normal limits. Mild degenerative change is noted at the dens.  The visualized portions of the thyroid gland are unremarkable in appearance. The visualized lung apices are clear. No significant soft tissue abnormalities are seen.  IMPRESSION: 1. No evidence of traumatic intracranial injury or fracture. 2. No evidence of acute fracture or subluxation along the cervical spine. 3. No evidence of fracture or dislocation with regard to the maxillofacial structures. 4. Mild cortical volume loss and scattered small vessel ischemic microangiopathy. 5. Mild partial opacification of the left mastoid air cells. 6. Calcification at the carotid bifurcations bilaterally. 7. Mild degenerative change noted along the cervical spine.   Electronically Signed   By: Garald Balding M.D.   On: 04/17/2015 03:52   Dg Shoulder Left  04/17/2015   CLINICAL DATA:  Status post fall from standing, with injury to the left shoulder. Left shoulder pain, swelling and bruising. Limited range of motion. Initial encounter.   EXAM: LEFT SHOULDER - 2+ VIEW  COMPARISON:  None.  FINDINGS: There is a comminuted fracture involving the left humeral head and neck, with a displaced greater tuberosity fragment. There is no evidence of dislocation. Humeral head fragments remain seated at the glenoid fossa.  No additional fractures are seen. The visualized portions of the lungs are clear. The left acromioclavicular joint is unremarkable in appearance.  IMPRESSION: Comminuted fracture involving the left humeral head and neck, with displaced greater tuberosity fragment. No evidence of dislocation.   Electronically Signed   By: Garald Balding M.D.   On: 04/17/2015 01:28   Dg Hip Unilat With Pelvis 2-3 Views Left  04/17/2015   CLINICAL DATA:  Left hip pain after a fall.  EXAM: LEFT HIP (WITH PELVIS) 2-3 VIEWS  COMPARISON:  None.  FINDINGS: There is no fracture or dislocation. There are slight osteophytes on the left femoral head and on the acetabulum. Fairly severe degenerative disc disease in the lumbar spine with a scoliosis.  IMPRESSION: No acute abnormalities.   Electronically Signed   By: Lorriane Shire M.D.   On: 04/17/2015 02:34   Ct Maxillofacial Wo Cm  04/17/2015   CLINICAL DATA:  Status post fall. Fell face forward, impacting mainly on the left. Left jaw pain. Concern for head or cervical spine injury.  EXAM: CT HEAD WITHOUT CONTRAST  CT MAXILLOFACIAL WITHOUT CONTRAST  CT CERVICAL SPINE WITHOUT CONTRAST  TECHNIQUE: Multidetector CT imaging of the head, cervical spine, and maxillofacial structures were performed using the standard protocol without intravenous contrast. Multiplanar CT image reconstructions of the cervical spine and maxillofacial structures were also generated.  COMPARISON:  None.  FINDINGS: CT HEAD FINDINGS  There is no evidence of acute infarction, mass lesion, or intra- or extra-axial hemorrhage on CT.  Prominence of the ventricles and sulci reflects mild cortical volume loss. Mild cerebellar atrophy is noted. Scattered  periventricular and subcortical white matter change likely reflects small vessel ischemic microangiopathy.  The brainstem and fourth ventricle are within normal limits. The basal ganglia are unremarkable in appearance. The cerebral hemispheres demonstrate grossly normal gray-white differentiation. No mass effect or midline shift is seen.  There is no evidence of fracture; visualized osseous structures are unremarkable in appearance. The orbits are within normal limits. The paranasal sinuses and right mastoid air cells are well-aerated. There is mild partial opacification of the left mastoid air cells. No significant soft tissue abnormalities are seen.  CT MAXILLOFACIAL FINDINGS  There is no evidence of fracture or dislocation. The maxilla and mandible appear intact. The nasal  bone is unremarkable in appearance. The visualized dentition demonstrates no acute abnormality.  The orbits are intact bilaterally. The visualized paranasal sinuses and mastoid air cells are well-aerated.  Calcification is noted at the carotid bifurcations bilaterally. The parapharyngeal fat planes are preserved. The nasopharynx, oropharynx and hypopharynx are unremarkable in appearance. The visualized portions of the valleculae and piriform sinuses are grossly unremarkable.  The parotid and submandibular glands are within normal limits. No cervical lymphadenopathy is seen.  CT CERVICAL SPINE FINDINGS  There is no evidence of acute fracture or subluxation. There is grade 1 anterolisthesis of C4 on C5, and grade 1 anterolisthesis of C6 on C7, with intervertebral disc space loss at C5-C6. This reflects underlying facet disease. Prevertebral soft tissues are within normal limits. Mild degenerative change is noted at the dens.  The visualized portions of the thyroid gland are unremarkable in appearance. The visualized lung apices are clear. No significant soft tissue abnormalities are seen.  IMPRESSION: 1. No evidence of traumatic intracranial  injury or fracture. 2. No evidence of acute fracture or subluxation along the cervical spine. 3. No evidence of fracture or dislocation with regard to the maxillofacial structures. 4. Mild cortical volume loss and scattered small vessel ischemic microangiopathy. 5. Mild partial opacification of the left mastoid air cells. 6. Calcification at the carotid bifurcations bilaterally. 7. Mild degenerative change noted along the cervical spine.   Electronically Signed   By: Garald Balding M.D.   On: 04/17/2015 03:52    Positive ROS: All other systems have been reviewed and were otherwise negative with the exception of those mentioned in the HPI and as above.  Labs cbc  Recent Labs  04/17/15 0137  WBC 9.8  HGB 11.0*  HCT 31.7*  PLT 202    Labs inflam No results for input(s): CRP in the last 72 hours.  Invalid input(s): ESR  Labs coag No results for input(s): INR, PTT in the last 72 hours.  Invalid input(s): PT   Recent Labs  04/17/15 0137  NA 137  K 3.9  CL 97  CO2 30  GLUCOSE 115*  BUN 27*  CREATININE 0.91  CALCIUM 8.3*    Physical Exam: Filed Vitals:   04/17/15 0500  BP: 99/54  Pulse: 97  Temp:   Resp:    General: Alert, no acute distress Cardiovascular: No pedal edema Respiratory: No cyanosis, no use of accessory musculature GI: No organomegaly, abdomen is soft and non-tender Skin: No lesions in the area of chief complaint other than those listed below in [facility] exam.  Neurologic: Sensation intact distally Psychiatric: Patient is competent for consent with normal mood and affect Lymphatic: No axillary or cervical lymphadenopathy  MUSCULOSKELETAL:  LUE pain with any range of motion compartments are soft sensations intact in median radial and ulnar nerve distributions good muscle function. Some swelling about her shoulder. LLE: Painless logroll no pain with hip range of motion some tenderness over her greater trip. Neurovascularly intact. Other extremities are  atraumatic with painless ROM and NVI.  Assessment: Left proximal humerus fracture  Plan: Physician of her shoulder is acceptable in my opinion and fixation and reduction of this does not outweigh the risks of surgery. I discussed this was with her I discussed with her the fact that she will ultimately lose range of motion but she would like to go forward with nonoperative treatment and I agree with this plan.  Sling for comfort full-time follow-up with me in 1-2 weeks for repeat x-rays.    Melanie Obrien,  Melanie Amble, MD Cell [phone]   04/17/2015 7:16 AM

## 2015-04-17 NOTE — Discharge Summary (Signed)
Physician Discharge Summary  Melanie Obrien ZOX:[id number] DOB: [birth year] DOA: 04/17/2015  PCP: Gaye Alken, MD  Admit date: 04/17/2015 Discharge date: 04/17/2015  Time spent: 30 minutes  Recommendations for Outpatient Follow-up:  1. Repeat CBC to follow Hgb trend 2. Repeat BMET to follow electrolytes and renal function  Discharge Diagnoses:  Active Problems:   Humeral head fracture paroxysmal atrial fibrillation Chronic diastolic heart failure HTN Physical deconditioning   Discharge Condition: stable and improved. Patient will be discharge to SNF for physical rehabilitation and assistance.  Diet recommendation: low sodium diet  Filed Weights   04/17/15 0530  Weight: 46.584 kg (102 lb 11.2 oz)    History of present illness:  81 y.o. female who tripped while walking with walker and fell. Landed on R side. Right sided shoulder pain and deformity after fall, worse with movement better with rest.  No syncope related to fall, no prodromic symptoms; patient believes she tripped. No LOC.  Hospital Course:  1-left humeral fracture: per orthopedic surgery and patient decision will follow conservative management initially. -she with be discharge with PRN analgesics -24/7 left shoulder immobilizer -follow up in 1-2 weeks in Dr. Eulah Pont office for further evaluation and decision on treatment -Patient to be discharge to SNF section at [facility] where she reside for assistance, care and rehabilitation.  2-paroxysmal atrial fibrillation: irregular rate appreciated; mild tachycardia -follow up with cardiology in outpatient setting -continue eliquis -continue amiodarone  3-HTN: patient to follow low sodium diet -continue low dose demadex  4-chronic diastolic heart failure: compensated and stable currently -will continue demadex -daily weights and low sodium diet  5-physical deconditioning/needs for assistance and limited activity level with left shoulder  fracture: -will discharge to SNF for rehab  *rest of medical problems intact. Will continue current medication regimen  Procedures:  See below for x-ray reports   Consultations:  Dr. Eulah Pont (Orthopedic service)  Discharge Exam: Filed Vitals:   04/17/15 1056  BP: 117/62  Pulse: 126  Temp: 98.7 F (37.1 C)  Resp: 18    General: afebrile, AAOX3; asking to be discharge to SNF section of [facility] where she reside. Reported pain left shoulder present mainly with movement; well controlled otherwise Cardiovascular: no rubs, or gallops, irregular rate/tachycardia appreciated Respiratory: good air movement, no wheezing or crackles Abd: soft, NT, ND, positive BS Extremities: no edema or cyanosis. Patient with pain and significant limited range of motion on her left shoulder  Discharge Instructions   Discharge Instructions    Diet - low sodium heart healthy    Complete by:  As directed      Discharge instructions    Complete by:  As directed   Use tylenol 500mg  every six hours as needed for moderate pain and breakthrough Maintain good hydration Daily weights, low sodium diet (less than 2.5 grams daily) Physical therapy as per facility protocol Keep left shoulder immobilizer in place all the time and follow with Dr. Eulah Pont (orthopedic service in 1-2 weeks after discharge)          Current Discharge Medication List    START taking these medications   Details  oxyCODONE (OXY IR/ROXICODONE) 5 MG immediate release tablet Take 1 tablet (5 mg total) by mouth every 6 (six) hours as needed for severe pain. Qty: 30 tablet, Refills: 0      CONTINUE these medications which have NOT CHANGED   Details  amiodarone (PACERONE) 200 MG tablet Take 200 mg by mouth daily.   Associated Diagnoses: Paroxysmal atrial fibrillation  apixaban (ELIQUIS) 2.5 MG TABS tablet Take 1 tablet (2.5 mg total) by mouth 2 (two) times daily. Qty: 60 tablet, Refills: 0   Associated Diagnoses: Paroxysmal  atrial fibrillation    Ascorbic Acid (VITAMIN C PO) Take 1 g by mouth 3 (three) times daily.     b complex vitamins tablet Take 0.5 tablets by mouth 3 (three) times daily.     BETA CAROTENE PO Take 1 tablet by mouth daily.    CALCIUM PO Take 1 tablet by mouth daily.    Cholecalciferol (VITAMIN D-3 PO) Take 1 tablet by mouth daily.    CHROMIUM PO Take 1 tablet by mouth daily.    Cyanocobalamin (VITAMIN B 12 PO) Take 1 tablet by mouth daily.    estradiol (ESTRACE) 1 MG tablet Take 0.5 mg by mouth daily.    fexofenadine (ALLEGRA) 180 MG tablet Take 180 mg by mouth daily.    MAGNESIUM PO Take 1 tablet by mouth daily.    Multiple Vitamin (MULTIVITAMIN WITH MINERALS) TABS tablet Take 1 tablet by mouth daily.    pregabalin (LYRICA) 25 MG capsule Take 50-75 mg by mouth 2 (two) times daily. Take 75 mg every morning and take 50 mg every evening    progesterone (PROMETRIUM) 100 MG capsule Take 100 mg by mouth every other day.     selenium 50 MCG TABS tablet Take 50 mcg by mouth daily.    torsemide (DEMADEX) 20 MG tablet Pt takes 1 tab daily and may take 1 extra daily if weight gain of 2 lbs or more Qty: 45 tablet, Refills: 3   Associated Diagnoses: Paroxysmal atrial fibrillation    VITAMIN A PO Take 1 tablet by mouth daily.    VITAMIN E PO Take 1 tablet by mouth daily.       Allergies  Allergen Reactions  . Codeine Other (See Comments)    unknown  . Levaquin [Levofloxacin In D5w] Itching  . Loratadine     TACHYCARDIA  . Xylocaine [Lidocaine Hcl] Other (See Comments)    Uncontrolled shaking   Follow-up Information    Follow up with MURPHY, TIMOTHY D, MD In 1 week.   Specialty:  Orthopedic Surgery   Contact information:   [address] [place] [address] [phone]       Follow up with Gaye Alken, MD. Schedule an appointment as soon as possible for a visit in 10 days.   Specialty:  Family Medicine   Contact information:   [address] [place] [address] [phone]       The results of significant diagnostics from this hospitalization (including imaging, microbiology, ancillary and laboratory) are listed below for reference.    Significant Diagnostic Studies: Dg Chest 1 View  04/17/2015   CLINICAL DATA:  Status post fall; concern for chest injury. Initial encounter.  EXAM: CHEST  1 VIEW  COMPARISON:  Chest radiograph performed 01/12/2015  FINDINGS: The lungs are well-aerated and clear. There is no evidence of focal opacification, pleural effusion or pneumothorax.  The cardiomediastinal silhouette is borderline normal in size. There is a comminuted fracture involving the left humeral head and neck, with mild impaction and displacement of a greater tuberosity fragment.  IMPRESSION: 1. Comminuted fracture involving the left humeral head and neck, with mild impaction and displacement of a greater tuberosity fragment. 2. No acute cardiopulmonary process seen. No displaced rib fractures identified.   Electronically Signed   By: Roanna Raider M.D.   On: 04/17/2015 02:37  Dg Thoracic Spine 2 View  04/17/2015   CLINICAL DATA:  Mid back pain after a fall.  EXAM: THORACIC SPINE - 2 VIEW  COMPARISON:  Chest x-rays dated 01/12/2015 and 12/24/2014  FINDINGS: There is no fracture or bone destruction. There is a thoracolumbar scoliosis which is unchanged. This is most severe in the lumbar spine.  IMPRESSION: No acute abnormality of the thoracic spine.   Electronically Signed   By: Francene Boyers M.D.   On: 04/17/2015 02:36   Ct Head Wo Contrast  04/17/2015   CLINICAL DATA:  Status post fall. Fell face forward, impacting mainly on the left. Left jaw pain. Concern for head or cervical spine injury.  EXAM: CT HEAD WITHOUT CONTRAST  CT MAXILLOFACIAL WITHOUT CONTRAST  CT CERVICAL SPINE WITHOUT CONTRAST  TECHNIQUE: Multidetector CT imaging of the head, cervical spine, and maxillofacial structures were performed using the standard  protocol without intravenous contrast. Multiplanar CT image reconstructions of the cervical spine and maxillofacial structures were also generated.  COMPARISON:  None.  FINDINGS: CT HEAD FINDINGS  There is no evidence of acute infarction, mass lesion, or intra- or extra-axial hemorrhage on CT.  Prominence of the ventricles and sulci reflects mild cortical volume loss. Mild cerebellar atrophy is noted. Scattered periventricular and subcortical white matter change likely reflects small vessel ischemic microangiopathy.  The brainstem and fourth ventricle are within normal limits. The basal ganglia are unremarkable in appearance. The cerebral hemispheres demonstrate grossly normal gray-white differentiation. No mass effect or midline shift is seen.  There is no evidence of fracture; visualized osseous structures are unremarkable in appearance. The orbits are within normal limits. The paranasal sinuses and right mastoid air cells are well-aerated. There is mild partial opacification of the left mastoid air cells. No significant soft tissue abnormalities are seen.  CT MAXILLOFACIAL FINDINGS  There is no evidence of fracture or dislocation. The maxilla and mandible appear intact. The nasal bone is unremarkable in appearance. The visualized dentition demonstrates no acute abnormality.  The orbits are intact bilaterally. The visualized paranasal sinuses and mastoid air cells are well-aerated.  Calcification is noted at the carotid bifurcations bilaterally. The parapharyngeal fat planes are preserved. The nasopharynx, oropharynx and hypopharynx are unremarkable in appearance. The visualized portions of the valleculae and piriform sinuses are grossly unremarkable.  The parotid and submandibular glands are within normal limits. No cervical lymphadenopathy is seen.  CT CERVICAL SPINE FINDINGS  There is no evidence of acute fracture or subluxation. There is grade 1 anterolisthesis of C4 on C5, and grade 1 anterolisthesis of C6 on  C7, with intervertebral disc space loss at C5-C6. This reflects underlying facet disease. Prevertebral soft tissues are within normal limits. Mild degenerative change is noted at the dens.  The visualized portions of the thyroid gland are unremarkable in appearance. The visualized lung apices are clear. No significant soft tissue abnormalities are seen.  IMPRESSION: 1. No evidence of traumatic intracranial injury or fracture. 2. No evidence of acute fracture or subluxation along the cervical spine. 3. No evidence of fracture or dislocation with regard to the maxillofacial structures. 4. Mild cortical volume loss and scattered small vessel ischemic microangiopathy. 5. Mild partial opacification of the left mastoid air cells. 6. Calcification at the carotid bifurcations bilaterally. 7. Mild degenerative change noted along the cervical spine.   Electronically Signed   By: Roanna Raider M.D.   On: 04/17/2015 03:52   Ct Cervical Spine Wo Contrast  04/17/2015   CLINICAL DATA:  Status post fall.  Fell face forward, impacting mainly on the left. Left jaw pain. Concern for head or cervical spine injury.  EXAM: CT HEAD WITHOUT CONTRAST  CT MAXILLOFACIAL WITHOUT CONTRAST  CT CERVICAL SPINE WITHOUT CONTRAST  TECHNIQUE: Multidetector CT imaging of the head, cervical spine, and maxillofacial structures were performed using the standard protocol without intravenous contrast. Multiplanar CT image reconstructions of the cervical spine and maxillofacial structures were also generated.  COMPARISON:  None.  FINDINGS: CT HEAD FINDINGS  There is no evidence of acute infarction, mass lesion, or intra- or extra-axial hemorrhage on CT.  Prominence of the ventricles and sulci reflects mild cortical volume loss. Mild cerebellar atrophy is noted. Scattered periventricular and subcortical white matter change likely reflects small vessel ischemic microangiopathy.  The brainstem and fourth ventricle are within normal limits. The basal ganglia  are unremarkable in appearance. The cerebral hemispheres demonstrate grossly normal gray-white differentiation. No mass effect or midline shift is seen.  There is no evidence of fracture; visualized osseous structures are unremarkable in appearance. The orbits are within normal limits. The paranasal sinuses and right mastoid air cells are well-aerated. There is mild partial opacification of the left mastoid air cells. No significant soft tissue abnormalities are seen.  CT MAXILLOFACIAL FINDINGS  There is no evidence of fracture or dislocation. The maxilla and mandible appear intact. The nasal bone is unremarkable in appearance. The visualized dentition demonstrates no acute abnormality.  The orbits are intact bilaterally. The visualized paranasal sinuses and mastoid air cells are well-aerated.  Calcification is noted at the carotid bifurcations bilaterally. The parapharyngeal fat planes are preserved. The nasopharynx, oropharynx and hypopharynx are unremarkable in appearance. The visualized portions of the valleculae and piriform sinuses are grossly unremarkable.  The parotid and submandibular glands are within normal limits. No cervical lymphadenopathy is seen.  CT CERVICAL SPINE FINDINGS  There is no evidence of acute fracture or subluxation. There is grade 1 anterolisthesis of C4 on C5, and grade 1 anterolisthesis of C6 on C7, with intervertebral disc space loss at C5-C6. This reflects underlying facet disease. Prevertebral soft tissues are within normal limits. Mild degenerative change is noted at the dens.  The visualized portions of the thyroid gland are unremarkable in appearance. The visualized lung apices are clear. No significant soft tissue abnormalities are seen.  IMPRESSION: 1. No evidence of traumatic intracranial injury or fracture. 2. No evidence of acute fracture or subluxation along the cervical spine. 3. No evidence of fracture or dislocation with regard to the maxillofacial structures. 4. Mild  cortical volume loss and scattered small vessel ischemic microangiopathy. 5. Mild partial opacification of the left mastoid air cells. 6. Calcification at the carotid bifurcations bilaterally. 7. Mild degenerative change noted along the cervical spine.   Electronically Signed   By: Roanna RaiderJeffery  Chang M.D.   On: 04/17/2015 03:52   Dg Shoulder Left  04/17/2015   CLINICAL DATA:  Status post fall from standing, with injury to the left shoulder. Left shoulder pain, swelling and bruising. Limited range of motion. Initial encounter.  EXAM: LEFT SHOULDER - 2+ VIEW  COMPARISON:  None.  FINDINGS: There is a comminuted fracture involving the left humeral head and neck, with a displaced greater tuberosity fragment. There is no evidence of dislocation. Humeral head fragments remain seated at the glenoid fossa.  No additional fractures are seen. The visualized portions of the lungs are clear. The left acromioclavicular joint is unremarkable in appearance.  IMPRESSION: Comminuted fracture involving the left humeral head and neck, with displaced greater  tuberosity fragment. No evidence of dislocation.   Electronically Signed   By: Roanna Raider M.D.   On: 04/17/2015 01:28   Dg Hip Unilat With Pelvis 2-3 Views Left  04/17/2015   CLINICAL DATA:  Left hip pain after a fall.  EXAM: LEFT HIP (WITH PELVIS) 2-3 VIEWS  COMPARISON:  None.  FINDINGS: There is no fracture or dislocation. There are slight osteophytes on the left femoral head and on the acetabulum. Fairly severe degenerative disc disease in the lumbar spine with a scoliosis.  IMPRESSION: No acute abnormalities.   Electronically Signed   By: Francene Boyers M.D.   On: 04/17/2015 02:34   Ct Maxillofacial Wo Cm  04/17/2015   CLINICAL DATA:  Status post fall. Fell face forward, impacting mainly on the left. Left jaw pain. Concern for head or cervical spine injury.  EXAM: CT HEAD WITHOUT CONTRAST  CT MAXILLOFACIAL WITHOUT CONTRAST  CT CERVICAL SPINE WITHOUT CONTRAST  TECHNIQUE:  Multidetector CT imaging of the head, cervical spine, and maxillofacial structures were performed using the standard protocol without intravenous contrast. Multiplanar CT image reconstructions of the cervical spine and maxillofacial structures were also generated.  COMPARISON:  None.  FINDINGS: CT HEAD FINDINGS  There is no evidence of acute infarction, mass lesion, or intra- or extra-axial hemorrhage on CT.  Prominence of the ventricles and sulci reflects mild cortical volume loss. Mild cerebellar atrophy is noted. Scattered periventricular and subcortical white matter change likely reflects small vessel ischemic microangiopathy.  The brainstem and fourth ventricle are within normal limits. The basal ganglia are unremarkable in appearance. The cerebral hemispheres demonstrate grossly normal gray-white differentiation. No mass effect or midline shift is seen.  There is no evidence of fracture; visualized osseous structures are unremarkable in appearance. The orbits are within normal limits. The paranasal sinuses and right mastoid air cells are well-aerated. There is mild partial opacification of the left mastoid air cells. No significant soft tissue abnormalities are seen.  CT MAXILLOFACIAL FINDINGS  There is no evidence of fracture or dislocation. The maxilla and mandible appear intact. The nasal bone is unremarkable in appearance. The visualized dentition demonstrates no acute abnormality.  The orbits are intact bilaterally. The visualized paranasal sinuses and mastoid air cells are well-aerated.  Calcification is noted at the carotid bifurcations bilaterally. The parapharyngeal fat planes are preserved. The nasopharynx, oropharynx and hypopharynx are unremarkable in appearance. The visualized portions of the valleculae and piriform sinuses are grossly unremarkable.  The parotid and submandibular glands are within normal limits. No cervical lymphadenopathy is seen.  CT CERVICAL SPINE FINDINGS  There is no evidence  of acute fracture or subluxation. There is grade 1 anterolisthesis of C4 on C5, and grade 1 anterolisthesis of C6 on C7, with intervertebral disc space loss at C5-C6. This reflects underlying facet disease. Prevertebral soft tissues are within normal limits. Mild degenerative change is noted at the dens.  The visualized portions of the thyroid gland are unremarkable in appearance. The visualized lung apices are clear. No significant soft tissue abnormalities are seen.  IMPRESSION: 1. No evidence of traumatic intracranial injury or fracture. 2. No evidence of acute fracture or subluxation along the cervical spine. 3. No evidence of fracture or dislocation with regard to the maxillofacial structures. 4. Mild cortical volume loss and scattered small vessel ischemic microangiopathy. 5. Mild partial opacification of the left mastoid air cells. 6. Calcification at the carotid bifurcations bilaterally. 7. Mild degenerative change noted along the cervical spine.   Electronically Signed  By: Roanna Raider M.D.   On: 04/17/2015 03:52   Labs: Basic Metabolic Panel:  Recent Labs Lab 04/17/15 0137  NA 137  K 3.9  CL 97  CO2 30  GLUCOSE 115*  BUN 27*  CREATININE 0.91  CALCIUM 8.3*   CBC:  Recent Labs Lab 04/17/15 0137  WBC 9.8  HGB 11.0*  HCT 31.7*  MCV 92.7  PLT 202   BNP (last 3 results)  Recent Labs  01/18/15 1717  BNP 526.6*    ProBNP (last 3 results)  Recent Labs  12/13/14 1405 12/30/14 1619 02/04/15 1446  PROBNP 2339.0* 395.0* 330.0*    Signed:  Vassie Loll  [facility] 04/17/2015, 12:33 PM

## 2015-04-17 NOTE — Progress Notes (Signed)
Pt discharge education and instructions completed; report called off to Brayton ElAndy Agyeman Boafo [facility]Charlene [facility]. Pt IV and telemetry removed. Pt sling remains intact to LUE; pt discharge to SNF with daughter to transport her home. Pt transported off unit via wheelchair with belongings and family at side. Arabella MerlesP. Amo Dawn Kiper RN.

## 2015-04-17 NOTE — Clinical Social Work Note (Signed)
Clinical Social Work Assessment  Patient Details  Name: Melanie Obrien MRN: [id number] Date of Birth: 02-15-1919  Date of referral:  04/17/15               Reason for consult:  Facility Placement                 Housing/Transportation Living arrangements for the past 2 months:  Independent Living Facility Source of Information:  Adult Children Patient Interpreter Needed:  None Criminal Activity/Legal Involvement Pertinent to Current Situation/Hospitalization:  No - Comment as needed Significant Relationships:  Adult Children Lives with:  Facility Resident Do you feel safe going back to the place where you live?  Yes Need for family participation in patient care:   Yes  Care giving concerns:  Pt's daughter, Selena BattenWendy McNeil reported she is pleased to learn patient is be transitioning SNF for short-term rehab.    Social Worker assessment / plan:  Visual merchandiserClinical Social Worker spoke with pt's dtr in reference to post-acute placement for SNF. CSW introduced CSW role and SNF process. Pt's dtr reported pt has [facility] an independent [facility] years and would like for her to return even if pt is needing short-term placement. Pt's dtr further reported she would [facility] to [facility]. Pt stated she is just ready to go home and is anxious to return. CSW has contacted [facility] to report MD is requiring pt to return to SNF level of care. CSW is currently awaiting a returned phone call from nursing staff to confirm pt can return with SNF care. CSW has prepared clinicals and [facility] FL[facility]. [place] is also completed for Short term SNF. No further concerns reported by family. CSW will continue to follow pt and pt's family for continued support and to facilitate pt's discharge needs once medically stable.  Employment status:  Retired Database administratornsurance information:  Managed Medicare PT Recommendations:  Skilled Nursing Facility Information  / Referral to community resources:  Skilled Nursing Facility  Patient/Family's Response to care:  Pt's reported she is ready to return home. Pt's daughter is pleased with care and only concern was that pt was seen by orthopedic MD before discharging. Pt's dtr also relieved to discover pt will be transitioning to a higher level of care.   Patient/Family's Understanding of and Emotional Response to Diagnosis, Current Treatment, and Prognosis:  Pt's dtr pleasant and pleased with care. Pt's dtr is a medical provider and expressed understanding of pt's diagnoses.   Emotional Assessment Appearance:  Appears stated age Attitude/Demeanor/Rapport:  Other Affect (typically observed):  Anxious Orientation:  Oriented to Self, Oriented to Place, Oriented to  Time, Oriented to Situation Alcohol / Substance use:  Never Used Psych involvement (Current and /or in the community):  No (Comment)  Discharge Needs  Concerns to be addressed:  Denies Needs/Concerns at this time Readmission within the last 30 days:  No Current discharge risk:  Physical Impairment Barriers to Discharge:  Barriers Resolved   Derenda FennelBashira Channah Godeaux, MSW, LCSWA [phone] 04/17/2015 11:11 AM

## 2015-04-17 NOTE — Evaluation (Signed)
Physical Therapy Evaluation Patient Details Name: Melanie Obrien MRN: [id number] DOB: January 18, 1919 Today's Date: 04/17/2015   History of Present Illness  Pt is a 81 y.o. Female admitted 04/17/15 for left humerus fx sustained after a fall at home. Plan is for non-operative tx of fx.  Clinical Impression  Pt admitted with above diagnosis. Pt currently with functional limitations due to the deficits listed below (see PT Problem List).  Pt will benefit from skilled PT to increase their independence and safety with mobility to allow discharge to the venue listed below.  Recommend returning to skilled portion of [facility] for short term SNF rehab to maximize her safety and mobility with L UE fx and sling. Spoke with weekend SW on the phone and told her PT/OT recs.     Follow Up Recommendations SNF    Equipment Recommendations  None recommended by PT    Recommendations for Other Services       Precautions / Restrictions Precautions Precautions: Fall;Shoulder Type of Shoulder Precautions: sling on when OOB; OK to have off for ADLs and sitting/supine; no ROM of LUE Shoulder Interventions: Shoulder sling/immobilizer;Off for dressing/bathing/exercises (on during ambulation) Precaution Comments: Educated pt on precautions Required Braces or Orthoses: Sling Restrictions Weight Bearing Restrictions: Yes LUE Weight Bearing: Non weight bearing      Mobility  Bed Mobility Overal bed mobility: Needs Assistance Bed Mobility: Supine to Sit     Supine to sit: Min guard;HOB elevated     General bed mobility comments: up in recliner upon arrival  Transfers Overall transfer level: Needs assistance Equipment used: 1 person hand held assist Transfers: Sit to/from Stand Sit to Stand: Min assist         General transfer comment: Does well scooting forward to edge of chair.  Decreased eccentric control with stand to sit.  Ambulation/Gait Ambulation/Gait assistance: Min assist Ambulation  Distance (Feet): 25 Feet Assistive device: 1 person hand held assist Gait Pattern/deviations: Wide base of support;Decreased step length - right;Decreased step length - left Gait velocity: decreased   General Gait Details: Guarded gait due to L UE.  Unsteadiness noted  Stairs            Wheelchair Mobility    Modified Rankin (Stroke Patients Only)       Balance Overall balance assessment: Needs assistance           Standing balance-Leahy Scale: Fair Standing balance comment: Fair static fair (-) dynamic                             Pertinent Vitals/Pain Pain Assessment: Faces Faces Pain Scale: Hurts whole lot Pain Location: Wincing with repositioning, but at rest no pain Pain Intervention(s): Monitored during session;Repositioned;Limited activity within patient's tolerance    Home Living Family/patient expects to be discharged to:: Private residence Living Arrangements: Alone Available Help at Discharge: Available PRN/intermittently Type of Home: Apartment Home Access: Level entry     Home Layout: One level Home Equipment: Walker - 4 wheels Additional Comments[facility]    Prior Function Level of Independence: Independent with assistive device(s)         Comments: Uses 4WW for ambulation     Hand Dominance   Dominant Hand: Right    Extremity/Trunk Assessment   Upper Extremity Assessment: Defer to OT evaluation       LUE Deficits / Details: No ROM of left shoulder. Non-operative tx for humerus fx.  Lower Extremity Assessment: Overall WFL for tasks assessed      Cervical / Trunk Assessment: Kyphotic  Communication   Communication: HOH  Cognition Arousal/Alertness: Awake/alert Behavior During Therapy: WFL for tasks assessed/performed Overall Cognitive Status: Within Functional Limits for tasks assessed                      General Comments      Exercises        Assessment/Plan    PT  Assessment Patient needs continued PT services  PT Diagnosis Difficulty walking   PT Problem List Decreased activity tolerance;Decreased balance;Pain;Decreased mobility  PT Treatment Interventions Gait training;Functional mobility training;Therapeutic activities;Therapeutic exercise;Balance training   PT Goals (Current goals can be found in the Care Plan section) Acute Rehab PT Goals Patient Stated Goal: to return home to ILF PT Goal Formulation: With patient Time For Goal Achievement: 05/01/15 Potential to Achieve Goals: Good    Frequency Min 5X/week   Barriers to discharge        Co-evaluation               End of Session   Activity Tolerance: Patient tolerated treatment well;Other (comment) (sling had not arrived so ambulated just in room with pt holding her L UE.  Sling arrived at end of session and ortho tech applied.) Patient left: in chair;with chair alarm set;with nursing/sitter in room Nurse Communication: Mobility status    Functional Assessment Tool Used: clinical judgement Functional Limitation: Mobility: Walking and moving around Mobility: Walking and Moving Around Current Status (Z6109): At least 1 percent but less than 20 percent impaired, limited or restricted Mobility: Walking and Moving Around Goal Status [phone]): At least 1 percent but less than 20 percent impaired, limited or restricted    Time: 1043-1109 PT Time Calculation (min) (ACUTE ONLY): 26 min   Charges:   PT Evaluation $Initial PT Evaluation Tier I: 1 Procedure PT Treatments $Therapeutic Activity: 8-22 mins   PT G Codes:   PT G-Codes **NOT FOR INPATIENT CLASS** Functional Assessment Tool Used: clinical judgement Functional Limitation: Mobility: Walking and moving around Mobility: Walking and Moving Around Current Status (U9811): At least 1 percent but less than 20 percent impaired, limited or restricted Mobility: Walking and Moving Around Goal Status [phone]): At least 1 percent but less  than 20 percent impaired, limited or restricted    [facility] LUBEC[facility] 04/17/2015, 11:30 AM

## 2015-04-17 NOTE — ED Provider Notes (Signed)
CSN: [id number]     Arrival date & time 04/17/15  0004 History   First MD Initiated Contact with Patient 04/17/15 [phone]     Chief Complaint  Patient presents with  . Fall    (Consider location/radiation/quality/duration/timing/severity/associated sxs/prior Treatment) HPI Comments: Patient is a 81 year old female with a history of TIA, acute on chronic diastolic heart failure, and atrial fibrillation on chronic Eliquis who presents to the emergency department for further evaluation of left shoulder pain. Patient reports that she was walking with her walker this evening at 2300 when she tripped and fell on her left side. Patient denies any loss of consciousness. She has had constant shoulder pain since her fall which is improved with immobilization and worsened with shoulder movement. No medications taken prior to arrival. She denies associated chest pain, shortness of breath, nausea, and vomiting. No lightheadedness or dizziness prior to falling.  PCP - Dr. Adair Laundry - None Patient lives independently at [facility] in an apartment  Patient is a 81 y.o. female presenting with fall. The history is provided by the patient. No language interpreter was used.  Fall Associated symptoms include arthralgias. Pertinent negatives include no chest pain, headaches, nausea, numbness, vomiting or weakness.    Past Medical History  Diagnosis Date  . Sinus tachycardia   . Pneumonia   . Scoliosis   . TIA (transient ischemic attack)   . Trigeminal neuralgia of left side of face 12/13/2014  . Hot flashes 12/13/2014  . Acute on chronic diastolic heart failure   . Atrial fibrillation   . Lower extremity edema   . CHF (congestive heart failure)   . Shortness of breath dyspnea    Past Surgical History  Procedure Laterality Date  . Tonsillectomy     Family History  Problem Relation Age of Onset  . Hypertension Father    History  Substance Use Topics  . Smoking status: Never Smoker   .  Smokeless tobacco: Never Used  . Alcohol Use: No   OB History    No data available      Review of Systems  Respiratory: Negative for shortness of breath.   Cardiovascular: Negative for chest pain.  Gastrointestinal: Negative for nausea and vomiting.  Musculoskeletal: Positive for arthralgias.  Neurological: Negative for dizziness, weakness, light-headedness, numbness and headaches.  All other systems reviewed and are negative.   Allergies  Codeine; Levaquin; Loratadine; and Xylocaine  Home Medications   Prior to Admission medications   Medication Sig Start Date End Date Taking? Authorizing Provider  amiodarone (PACERONE) 200 MG tablet Take 200 mg by mouth daily.   Yes Historical Provider, MD  apixaban (ELIQUIS) 2.5 MG TABS tablet Take 1 tablet (2.5 mg total) by mouth 2 (two) times daily. 01/25/15  Yes Duke Salvia, MD  Ascorbic Acid (VITAMIN C PO) Take 1 g by mouth 3 (three) times daily.    Yes Historical Provider, MD  b complex vitamins tablet Take 0.5 tablets by mouth 3 (three) times daily.    Yes Historical Provider, MD  BETA CAROTENE PO Take 1 tablet by mouth daily.   Yes Historical Provider, MD  CALCIUM PO Take 1 tablet by mouth daily.   Yes Historical Provider, MD  Cholecalciferol (VITAMIN D-3 PO) Take 1 tablet by mouth daily.   Yes Historical Provider, MD  CHROMIUM PO Take 1 tablet by mouth daily.   Yes Historical Provider, MD  Cyanocobalamin (VITAMIN B 12 PO) Take 1 tablet by mouth daily.   Yes Historical  Provider, MD  estradiol (ESTRACE) 1 MG tablet Take 0.5 mg by mouth daily.   Yes Historical Provider, MD  fexofenadine (ALLEGRA) 180 MG tablet Take 180 mg by mouth daily.   Yes Historical Provider, MD  MAGNESIUM PO Take 1 tablet by mouth daily.   Yes Historical Provider, MD  Multiple Vitamin (MULTIVITAMIN WITH MINERALS) TABS tablet Take 1 tablet by mouth daily.   Yes Historical Provider, MD  pregabalin (LYRICA) 25 MG capsule Take 50-75 mg by mouth 2 (two) times daily.  Take 75 mg every morning and take 50 mg every evening   Yes Historical Provider, MD  progesterone (PROMETRIUM) 100 MG capsule Take 100 mg by mouth every other day.    Yes Historical Provider, MD  selenium 50 MCG TABS tablet Take 50 mcg by mouth daily.   Yes Historical Provider, MD  torsemide (DEMADEX) 20 MG tablet Pt takes 1 tab daily and may take 1 extra daily if weight gain of 2 lbs or more Patient taking differently: Take 20 mg by mouth daily. Pt takes 1 tab daily and may take 1 extra daily if weight gain of 2 lbs or more 03/31/15  Yes Corky Crafts, MD  VITAMIN A PO Take 1 tablet by mouth daily.   Yes Historical Provider, MD  VITAMIN E PO Take 1 tablet by mouth daily.   Yes Historical Provider, MD   BP 112/62 mmHg  Pulse 101  Temp(Src) 98.4 F (36.9 C) (Oral)  Resp 18  SpO2 99%   Physical Exam  Constitutional: She is oriented to person, place, and time. She appears well-developed and well-nourished. No distress.  HENT:  Head: Normocephalic and atraumatic.  No hematoma or contusion noted. No battle's sign or raccoons eyes.  Eyes: Conjunctivae and EOM are normal. No scleral icterus.  Neck: Normal range of motion.  Cardiovascular: Regular rhythm and intact distal pulses.   Mild tachycardia. Distal radial pulse 2+ in the LUE  Pulmonary/Chest: Effort normal. No respiratory distress.  Respirations even and unlabored  Musculoskeletal:       Left shoulder: She exhibits decreased range of motion, tenderness, bony tenderness, deformity (anterior deformity of L shoulder) and pain. She exhibits no swelling, no effusion and normal strength.       Left upper arm: She exhibits tenderness. She exhibits no bony tenderness, no swelling and no deformity.  Neurological: She is alert and oriented to person, place, and time. She exhibits normal muscle tone. Coordination normal.  GCS 15. Speech is goal oriented. Sensation to light touch intact in bilateral upper extremities. 5/5 grip strength in the  left upper extremity.  Skin: Skin is warm and dry. No rash noted. She is not diaphoretic. No erythema. No pallor.  Psychiatric: She has a normal mood and affect. Her behavior is normal.  Nursing note and vitals reviewed.   ED Course  Procedures (including critical care time) Labs Review Labs Reviewed  CBC - Abnormal; Notable for the following:    RBC 3.42 (*)    Hemoglobin 11.0 (*)    HCT 31.7 (*)    RDW 15.8 (*)    All other components within normal limits  BASIC METABOLIC PANEL - Abnormal; Notable for the following:    Glucose, Bld 115 (*)    BUN 27 (*)    Calcium 8.3 (*)    GFR calc non Af Amer 52 (*)    GFR calc Af Amer 61 (*)    All other components within normal limits    Imaging  Review Dg Chest 1 View  04/17/2015   CLINICAL DATA:  Status post fall; concern for chest injury. Initial encounter.  EXAM: CHEST  1 VIEW  COMPARISON:  Chest radiograph performed 01/12/2015  FINDINGS: The lungs are well-aerated and clear. There is no evidence of focal opacification, pleural effusion or pneumothorax.  The cardiomediastinal silhouette is borderline normal in size. There is a comminuted fracture involving the left humeral head and neck, with mild impaction and displacement of a greater tuberosity fragment.  IMPRESSION: 1. Comminuted fracture involving the left humeral head and neck, with mild impaction and displacement of a greater tuberosity fragment. 2. No acute cardiopulmonary process seen. No displaced rib fractures identified.   Electronically Signed   By: Roanna RaiderJeffery  Chang M.D.   On: 04/17/2015 02:37   Dg Thoracic Spine 2 View  04/17/2015   CLINICAL DATA:  Mid back pain after a fall.  EXAM: THORACIC SPINE - 2 VIEW  COMPARISON:  Chest x-rays dated 01/12/2015 and 12/24/2014  FINDINGS: There is no fracture or bone destruction. There is a thoracolumbar scoliosis which is unchanged. This is most severe in the lumbar spine.  IMPRESSION: No acute abnormality of the thoracic spine.    Electronically Signed   By: Francene BoyersJames  Maxwell M.D.   On: 04/17/2015 02:36   Ct Head Wo Contrast  04/17/2015   CLINICAL DATA:  Status post fall. Fell face forward, impacting mainly on the left. Left jaw pain. Concern for head or cervical spine injury.  EXAM: CT HEAD WITHOUT CONTRAST  CT MAXILLOFACIAL WITHOUT CONTRAST  CT CERVICAL SPINE WITHOUT CONTRAST  TECHNIQUE: Multidetector CT imaging of the head, cervical spine, and maxillofacial structures were performed using the standard protocol without intravenous contrast. Multiplanar CT image reconstructions of the cervical spine and maxillofacial structures were also generated.  COMPARISON:  None.  FINDINGS: CT HEAD FINDINGS  There is no evidence of acute infarction, mass lesion, or intra- or extra-axial hemorrhage on CT.  Prominence of the ventricles and sulci reflects mild cortical volume loss. Mild cerebellar atrophy is noted. Scattered periventricular and subcortical white matter change likely reflects small vessel ischemic microangiopathy.  The brainstem and fourth ventricle are within normal limits. The basal ganglia are unremarkable in appearance. The cerebral hemispheres demonstrate grossly normal gray-white differentiation. No mass effect or midline shift is seen.  There is no evidence of fracture; visualized osseous structures are unremarkable in appearance. The orbits are within normal limits. The paranasal sinuses and right mastoid air cells are well-aerated. There is mild partial opacification of the left mastoid air cells. No significant soft tissue abnormalities are seen.  CT MAXILLOFACIAL FINDINGS  There is no evidence of fracture or dislocation. The maxilla and mandible appear intact. The nasal bone is unremarkable in appearance. The visualized dentition demonstrates no acute abnormality.  The orbits are intact bilaterally. The visualized paranasal sinuses and mastoid air cells are well-aerated.  Calcification is noted at the carotid bifurcations  bilaterally. The parapharyngeal fat planes are preserved. The nasopharynx, oropharynx and hypopharynx are unremarkable in appearance. The visualized portions of the valleculae and piriform sinuses are grossly unremarkable.  The parotid and submandibular glands are within normal limits. No cervical lymphadenopathy is seen.  CT CERVICAL SPINE FINDINGS  There is no evidence of acute fracture or subluxation. There is grade 1 anterolisthesis of C4 on C5, and grade 1 anterolisthesis of C6 on C7, with intervertebral disc space loss at C5-C6. This reflects underlying facet disease. Prevertebral soft tissues are within normal limits. Mild degenerative change is  noted at the dens.  The visualized portions of the thyroid gland are unremarkable in appearance. The visualized lung apices are clear. No significant soft tissue abnormalities are seen.  IMPRESSION: 1. No evidence of traumatic intracranial injury or fracture. 2. No evidence of acute fracture or subluxation along the cervical spine. 3. No evidence of fracture or dislocation with regard to the maxillofacial structures. 4. Mild cortical volume loss and scattered small vessel ischemic microangiopathy. 5. Mild partial opacification of the left mastoid air cells. 6. Calcification at the carotid bifurcations bilaterally. 7. Mild degenerative change noted along the cervical spine.   Electronically Signed   By: Roanna Raider M.D.   On: 04/17/2015 03:52   Ct Cervical Spine Wo Contrast  04/17/2015   CLINICAL DATA:  Status post fall. Fell face forward, impacting mainly on the left. Left jaw pain. Concern for head or cervical spine injury.  EXAM: CT HEAD WITHOUT CONTRAST  CT MAXILLOFACIAL WITHOUT CONTRAST  CT CERVICAL SPINE WITHOUT CONTRAST  TECHNIQUE: Multidetector CT imaging of the head, cervical spine, and maxillofacial structures were performed using the standard protocol without intravenous contrast. Multiplanar CT image reconstructions of the cervical spine and  maxillofacial structures were also generated.  COMPARISON:  None.  FINDINGS: CT HEAD FINDINGS  There is no evidence of acute infarction, mass lesion, or intra- or extra-axial hemorrhage on CT.  Prominence of the ventricles and sulci reflects mild cortical volume loss. Mild cerebellar atrophy is noted. Scattered periventricular and subcortical white matter change likely reflects small vessel ischemic microangiopathy.  The brainstem and fourth ventricle are within normal limits. The basal ganglia are unremarkable in appearance. The cerebral hemispheres demonstrate grossly normal gray-white differentiation. No mass effect or midline shift is seen.  There is no evidence of fracture; visualized osseous structures are unremarkable in appearance. The orbits are within normal limits. The paranasal sinuses and right mastoid air cells are well-aerated. There is mild partial opacification of the left mastoid air cells. No significant soft tissue abnormalities are seen.  CT MAXILLOFACIAL FINDINGS  There is no evidence of fracture or dislocation. The maxilla and mandible appear intact. The nasal bone is unremarkable in appearance. The visualized dentition demonstrates no acute abnormality.  The orbits are intact bilaterally. The visualized paranasal sinuses and mastoid air cells are well-aerated.  Calcification is noted at the carotid bifurcations bilaterally. The parapharyngeal fat planes are preserved. The nasopharynx, oropharynx and hypopharynx are unremarkable in appearance. The visualized portions of the valleculae and piriform sinuses are grossly unremarkable.  The parotid and submandibular glands are within normal limits. No cervical lymphadenopathy is seen.  CT CERVICAL SPINE FINDINGS  There is no evidence of acute fracture or subluxation. There is grade 1 anterolisthesis of C4 on C5, and grade 1 anterolisthesis of C6 on C7, with intervertebral disc space loss at C5-C6. This reflects underlying facet disease.  Prevertebral soft tissues are within normal limits. Mild degenerative change is noted at the dens.  The visualized portions of the thyroid gland are unremarkable in appearance. The visualized lung apices are clear. No significant soft tissue abnormalities are seen.  IMPRESSION: 1. No evidence of traumatic intracranial injury or fracture. 2. No evidence of acute fracture or subluxation along the cervical spine. 3. No evidence of fracture or dislocation with regard to the maxillofacial structures. 4. Mild cortical volume loss and scattered small vessel ischemic microangiopathy. 5. Mild partial opacification of the left mastoid air cells. 6. Calcification at the carotid bifurcations bilaterally. 7. Mild degenerative change noted along the  cervical spine.   Electronically Signed   By: Roanna Raider M.D.   On: 04/17/2015 03:52   Dg Shoulder Left  04/17/2015   CLINICAL DATA:  Status post fall from standing, with injury to the left shoulder. Left shoulder pain, swelling and bruising. Limited range of motion. Initial encounter.  EXAM: LEFT SHOULDER - 2+ VIEW  COMPARISON:  None.  FINDINGS: There is a comminuted fracture involving the left humeral head and neck, with a displaced greater tuberosity fragment. There is no evidence of dislocation. Humeral head fragments remain seated at the glenoid fossa.  No additional fractures are seen. The visualized portions of the lungs are clear. The left acromioclavicular joint is unremarkable in appearance.  IMPRESSION: Comminuted fracture involving the left humeral head and neck, with displaced greater tuberosity fragment. No evidence of dislocation.   Electronically Signed   By: Roanna Raider M.D.   On: 04/17/2015 01:28   Dg Hip Unilat With Pelvis 2-3 Views Left  04/17/2015   CLINICAL DATA:  Left hip pain after a fall.  EXAM: LEFT HIP (WITH PELVIS) 2-3 VIEWS  COMPARISON:  None.  FINDINGS: There is no fracture or dislocation. There are slight osteophytes on the left femoral head  and on the acetabulum. Fairly severe degenerative disc disease in the lumbar spine with a scoliosis.  IMPRESSION: No acute abnormalities.   Electronically Signed   By: Francene Boyers M.D.   On: 04/17/2015 02:34   Ct Maxillofacial Wo Cm  04/17/2015   CLINICAL DATA:  Status post fall. Fell face forward, impacting mainly on the left. Left jaw pain. Concern for head or cervical spine injury.  EXAM: CT HEAD WITHOUT CONTRAST  CT MAXILLOFACIAL WITHOUT CONTRAST  CT CERVICAL SPINE WITHOUT CONTRAST  TECHNIQUE: Multidetector CT imaging of the head, cervical spine, and maxillofacial structures were performed using the standard protocol without intravenous contrast. Multiplanar CT image reconstructions of the cervical spine and maxillofacial structures were also generated.  COMPARISON:  None.  FINDINGS: CT HEAD FINDINGS  There is no evidence of acute infarction, mass lesion, or intra- or extra-axial hemorrhage on CT.  Prominence of the ventricles and sulci reflects mild cortical volume loss. Mild cerebellar atrophy is noted. Scattered periventricular and subcortical white matter change likely reflects small vessel ischemic microangiopathy.  The brainstem and fourth ventricle are within normal limits. The basal ganglia are unremarkable in appearance. The cerebral hemispheres demonstrate grossly normal gray-white differentiation. No mass effect or midline shift is seen.  There is no evidence of fracture; visualized osseous structures are unremarkable in appearance. The orbits are within normal limits. The paranasal sinuses and right mastoid air cells are well-aerated. There is mild partial opacification of the left mastoid air cells. No significant soft tissue abnormalities are seen.  CT MAXILLOFACIAL FINDINGS  There is no evidence of fracture or dislocation. The maxilla and mandible appear intact. The nasal bone is unremarkable in appearance. The visualized dentition demonstrates no acute abnormality.  The orbits are intact  bilaterally. The visualized paranasal sinuses and mastoid air cells are well-aerated.  Calcification is noted at the carotid bifurcations bilaterally. The parapharyngeal fat planes are preserved. The nasopharynx, oropharynx and hypopharynx are unremarkable in appearance. The visualized portions of the valleculae and piriform sinuses are grossly unremarkable.  The parotid and submandibular glands are within normal limits. No cervical lymphadenopathy is seen.  CT CERVICAL SPINE FINDINGS  There is no evidence of acute fracture or subluxation. There is grade 1 anterolisthesis of C4 on C5, and grade 1 anterolisthesis  of C6 on C7, with intervertebral disc space loss at C5-C6. This reflects underlying facet disease. Prevertebral soft tissues are within normal limits. Mild degenerative change is noted at the dens.  The visualized portions of the thyroid gland are unremarkable in appearance. The visualized lung apices are clear. No significant soft tissue abnormalities are seen.  IMPRESSION: 1. No evidence of traumatic intracranial injury or fracture. 2. No evidence of acute fracture or subluxation along the cervical spine. 3. No evidence of fracture or dislocation with regard to the maxillofacial structures. 4. Mild cortical volume loss and scattered small vessel ischemic microangiopathy. 5. Mild partial opacification of the left mastoid air cells. 6. Calcification at the carotid bifurcations bilaterally. 7. Mild degenerative change noted along the cervical spine.   Electronically Signed   By: Roanna Raider M.D.   On: 04/17/2015 03:52     EKG Interpretation   Date/Time:  Saturday April 17 2015 01:47:01 EDT Ventricular Rate:  95 PR Interval:    QRS Duration: 112 QT Interval:  394 QTC Calculation: 495 R Axis:   -81 Text Interpretation:  Age not entered, assumed to be  81 years old for  purpose of ECG interpretation Atrial flutter with predominant 3:1 AV block  Left anterior fascicular block Abnormal R-wave  progression, late  transition Borderline T abnormalities, anterior leads Borderline prolonged  QT interval Artifact in lead(s) I II aVR aVL aVF No significant change  since last tracing 20 Jan 2015 Confirmed by [facility]  MD-I, IVA ([id number]) on  04/17/2015 1:57:50 AM      MDM   Final diagnoses:  Fall  Fracture of humeral head, closed, left, initial encounter  Left shoulder pain    81 year old female presents to the emergency department for further evaluation of left shoulder pain after a trip and fall at home. Patient found to have a comminuted fracture involving the left humeral head and neck as well as a displaced greater tuberosity fragment. She is neurovascularly intact on my exam. No other injuries noted on exam or imaging.  Case discussed with Dr. Eulah Pont, on call for orthopedic surgery. He states that the injury is likely nonoperative; however, patient lives independently and ambulates with a walker at baseline. She is unable to return home because of the limitations of this injury. Plan to admit to the hospitalist service for pain control; social work to be consulted in the morning. Dr. Eulah Pont to also see and evaluate patient in the AM.   Filed Vitals:   04/17/15 0035 04/17/15 0245 04/17/15 0330 04/17/15 0400  BP:  112/62 107/51 103/57  Pulse: 108 101 103 114  Temp:      TempSrc:      Resp:      SpO2: 97% 99% 96% 96%     Antony Madura, PA-C 04/17/15 0502  Devoria Albe, MD 04/17/15 3086

## 2015-04-17 NOTE — ED Notes (Signed)
Pt. tripped and fell at home this evening , no LOC / ambulatory , reports pain at left shoulder joint worse with movement .

## 2015-04-17 NOTE — Progress Notes (Signed)
Admitted pt to rm 3E19 from ED via wheelchair, placed on bed comfortably, pt c/o 8/10 pain to her left shoulder but declined pain medicine. Pt also refused to answer admission questions telling the RN that she wants to rest at this time. Shift assessment done. CCMD notified for this pt's admission and confirmed tele box # 20 AFib. Will continue to monitor.

## 2015-04-17 NOTE — Evaluation (Signed)
Occupational Therapy Evaluation Patient Details Name: Melanie Obrien MRN: [id number] DOB: 06-Oct-1919 Today's Date: 04/17/2015    History of Present Illness Pt is a 81 y.o. Female admitted 04/17/15 for left humerus fx sustained after a fall at home. Plan is for non-operative tx of fx.   Clinical Impression   PTA pt lived at home at ILF and was independent with ADLs with use of 4WW. Pt is limited by LUE immobilization and pain due to fx and requires assistance for ADLs and min A hand held assist for mobility. Pt will benefit from ST Rehab at SNF due to level of impairment and assist needed at d/c. Pt will benefit from acute OT to address ADLs and UE impairment.     Follow Up Recommendations  SNF;Supervision/Assistance - 24 hour    Equipment Recommendations  None recommended by OT    Recommendations for Other Services       Precautions / Restrictions Precautions Precautions: Fall;Shoulder Type of Shoulder Precautions: sling on when OOB; OK to have off for ADLs and sitting/supine; no ROM of LUE Shoulder Interventions: Shoulder sling/immobilizer;Off for dressing/bathing/exercises (on during ambulation) Precaution Comments: Educated pt on precautions Required Braces or Orthoses: Sling Restrictions Weight Bearing Restrictions: Yes LUE Weight Bearing: Non weight bearing      Mobility Bed Mobility Overal bed mobility: Needs Assistance Bed Mobility: Supine to Sit     Supine to sit: Min guard;HOB elevated     General bed mobility comments: Use of bed rail with RUE. Pt able to progress to EOB with min guard.   Transfers Overall transfer level: Needs assistance Equipment used: 1 person hand held assist Transfers: Sit to/from Stand Sit to Stand: Min assist         General transfer comment: Min A to stand and maintain balance. Pt able to ambulate around foot of bed and sit in recliner chair with hand held assist.          ADL Overall ADL's : Needs  assistance/impaired Eating/Feeding: Set up;Sitting Eating/Feeding Details (indicate cue type and reason): needs assist to open containers and cut food Grooming: Set up;Sitting   Upper Body Bathing: Moderate assistance;Sitting   Lower Body Bathing: Moderate assistance;Sit to/from stand   Upper Body Dressing : Maximal assistance;Sitting   Lower Body Dressing: Maximal assistance;Sit to/from stand   Toilet Transfer: Minimal assistance;Ambulation (1 person hand held assist)           Functional mobility during ADLs: Minimal assistance (1 person hand held assist) General ADL Comments: Pt requires increased assist due to LUE impairment     Vision Additional Comments: No change from baseline          Pertinent Vitals/Pain Pain Assessment: No/denies pain     Hand Dominance Right   Extremity/Trunk Assessment Upper Extremity Assessment Upper Extremity Assessment: LUE deficits/detail LUE Deficits / Details: No ROM of left shoulder. Non-operative tx for humerus fx.    Lower Extremity Assessment Lower Extremity Assessment: Defer to PT evaluation   Cervical / Trunk Assessment Cervical / Trunk Assessment: Kyphotic   Communication Communication Communication: HOH   Cognition Arousal/Alertness: Awake/alert Behavior During Therapy: WFL for tasks assessed/performed Overall Cognitive Status: Within Functional Limits for tasks assessed                                Home Living Family/patient expects to be discharged to:: Private residence Living Arrangements: Alone Available Help at Discharge: Available PRN/intermittently Type  of Home: Apartment Home Access: Level entry     Home Layout: One level     Bathroom Shower/Tub: Tub/shower unit;Curtain   Firefighter: Standard     Home Equipment: Environmental consultant - 4 wheels   Additional Comments: Pt lives at [facility] ILF      Prior Functioning/Environment Level of Independence: Independent with assistive  device(s)        Comments: Uses 4WW for ambulation    OT Diagnosis: Generalized weakness;Acute pain   OT Problem List: Decreased strength;Decreased range of motion;Decreased activity tolerance;Impaired balance (sitting and/or standing);Decreased knowledge of precautions;Impaired UE functional use;Pain   OT Treatment/Interventions: Self-care/ADL training;Therapeutic exercise;Energy conservation;DME and/or AE instruction;Therapeutic activities;Patient/family education;Balance training    OT Goals(Current goals can be found in the care plan section) Acute Rehab OT Goals Patient Stated Goal: to return home to ILF OT Goal Formulation: With patient Time For Goal Achievement: 05/01/15 Potential to Achieve Goals: Good ADL Goals Pt Will Perform Upper Body Bathing: with min assist;sitting Pt Will Perform Upper Body Dressing: with min assist;sitting Pt Will Transfer to Toilet: with supervision;ambulating Pt Will Perform Toileting - Clothing Manipulation and hygiene: with supervision;sit to/from stand  OT Frequency: Min 2X/week   Barriers to D/C: Decreased caregiver support             End of Session Equipment Utilized During Treatment: Gait belt;Other (comment) (sling)  Activity Tolerance: Patient tolerated treatment well Patient left: in chair;with call bell/phone within reach;with chair alarm set   Time: 1610-9604 OT Time Calculation (min): 31 min Charges:  OT General Charges $OT Visit: 1 Procedure OT Evaluation $Initial OT Evaluation Tier I: 1 Procedure OT Treatments $Self Care/Home Management : 8-22 mins G-Codes: OT G-codes **NOT FOR INPATIENT CLASS** Functional Assessment Tool Used: clinical judgement Functional Limitation: Self care Self Care Current Status (V4098): At least 60 percent but less than 80 percent impaired, limited or restricted Self Care Goal Status (J1914): At least 20 percent but less than 40 percent impaired, limited or restricted  Rae Lips 04/17/2015, 10:25 AM  Carney Living, OTR/L Occupational Therapist [phone] (pager)

## 2015-04-20 ENCOUNTER — Non-Acute Institutional Stay (SKILLED_NURSING_FACILITY): Payer: Medicare PPO | Admitting: Nurse Practitioner

## 2015-04-20 ENCOUNTER — Encounter: Payer: Self-pay | Admitting: Nurse Practitioner

## 2015-04-20 DIAGNOSIS — I480 Paroxysmal atrial fibrillation: Secondary | ICD-10-CM

## 2015-04-20 DIAGNOSIS — I5032 Chronic diastolic (congestive) heart failure: Secondary | ICD-10-CM

## 2015-04-20 DIAGNOSIS — I10 Essential (primary) hypertension: Secondary | ICD-10-CM | POA: Diagnosis not present

## 2015-04-20 DIAGNOSIS — S42292S Other displaced fracture of upper end of left humerus, sequela: Secondary | ICD-10-CM

## 2015-04-20 DIAGNOSIS — J302 Other seasonal allergic rhinitis: Secondary | ICD-10-CM | POA: Diagnosis not present

## 2015-04-20 DIAGNOSIS — I5033 Acute on chronic diastolic (congestive) heart failure: Secondary | ICD-10-CM | POA: Diagnosis not present

## 2015-04-20 DIAGNOSIS — R232 Flushing: Secondary | ICD-10-CM

## 2015-04-20 DIAGNOSIS — G500 Trigeminal neuralgia: Secondary | ICD-10-CM | POA: Diagnosis not present

## 2015-04-20 DIAGNOSIS — I110 Hypertensive heart disease with heart failure: Secondary | ICD-10-CM | POA: Insufficient documentation

## 2015-04-20 DIAGNOSIS — N951 Menopausal and female climacteric states: Secondary | ICD-10-CM

## 2015-04-20 LAB — CBC AND DIFFERENTIAL
HEMATOCRIT: 22 % — AB (ref 36–46)
Hemoglobin: 7.6 g/dL — AB (ref 12.0–16.0)
WBC: 12 10^3/mL

## 2015-04-20 LAB — BASIC METABOLIC PANEL
BUN: 20 mg/dL (ref 4–21)
CREATININE: 0.6 mg/dL (ref 0.5–1.1)
GLUCOSE: 97 mg/dL
POTASSIUM: 3.7 mmol/L (ref 3.4–5.3)
Sodium: 132 mmol/L — AB (ref 137–147)

## 2015-04-20 NOTE — Assessment & Plan Note (Addendum)
Takes Progesterone 100mg qod and Estrace 0.5mg daily.   

## 2015-04-20 NOTE — Assessment & Plan Note (Signed)
-  HTN: patient to follow low sodium diet -continue low dose demadex

## 2015-04-20 NOTE — Assessment & Plan Note (Signed)
Clinically compensated, takes Trosemide 20mg daily.   

## 2015-04-20 NOTE — Assessment & Plan Note (Signed)
Pain is managed with Lyrica 50mg bid.   

## 2015-04-20 NOTE — Progress Notes (Signed)
Patient ID: Melanie Obrien, female   DOB: 12-31-1918, 81 y.o.   MRN: [id number]   Code Status: full code  Allergies  Allergen Reactions  . Codeine Other (See Comments)    unknown  . Levaquin [Levofloxacin In D5w] Itching  . Loratadine     TACHYCARDIA  . Xylocaine [Lidocaine Hcl] Other (See Comments)    Uncontrolled shaking    Chief Complaint  Patient presents with  . Medical Management of Chronic Issues  . Acute Visit  . Hospitalization Follow-up    HPI: Patient is a 81 y.o. female seen in the SNF at [facility] today for evaluation of  chronic medical conditions.     Hospitalized 04/17/15 fro Humeral head fracture paroxysmal atrial fibrillation Chronic diastolic heart failure  Presented to ED for s/p fell. Landed on R side. Right sided shoulder pain and deformity after fall, worse with movement better with rest.        Problem List Items Addressed This Visit    Paroxysmal atrial fibrillation (Chronic)    Heart rate is in control. Takes Amiodarone  daily. Eliquis 2.5mg  bid for thromboembolism risk reduction.       Trigeminal neuralgia of left side of face    Pain is managed with Lyrica  bid.       Hot flashes    Takes Progesterone  qod and Estrace 0.5mg  daily.       Acute on chronic diastolic heart failure    Clinically compensated, takes Trosemide  daily.       Fracture of humeral head, left, closed - Primary    04/17/15 CXR   IMPRESSION: 1. Comminuted fracture involving the left humeral head and neck, with mild impaction and displacement of a greater tuberosity fragment. 2. No acute cardiopulmonary process seen. No displaced rib fractures Identified.  Prn Tylenol and Oxycodone available for pain  - conservative management initially. - 24/7 left shoulder immobilizer -follow up Dr. Eulah Pont office for further evaluation and decision on treatment          Other seasonal allergic rhinitis    Takes allegra  daily.       HTN (hypertension)    -HTN: patient to follow low sodium diet -continue low dose demadex         Review of Systems:  Review of Systems  Constitutional: Positive for fatigue. Negative for fever, chills, diaphoresis, activity change, appetite change and unexpected weight change.  HENT: Positive for congestion and hearing loss. Negative for dental problem, drooling, ear discharge, ear pain, facial swelling, mouth sores, nosebleeds, postnasal drip, rhinorrhea, sinus pressure, sneezing, sore throat, tinnitus, trouble swallowing and voice change.        Rhinitis.   Eyes: Negative for photophobia, pain, discharge, redness, itching and visual disturbance.  Respiratory: Negative for apnea, cough, choking, chest tightness, shortness of breath, wheezing and stridor.   Cardiovascular: Positive for leg swelling.  Gastrointestinal: Negative for nausea, vomiting, abdominal pain, diarrhea, constipation, blood in stool, abdominal distention, anal bleeding and rectal pain.  Endocrine: Negative for cold intolerance, heat intolerance, polydipsia, polyphagia and polyuria.  Genitourinary: Positive for frequency. Negative for dysuria, urgency, hematuria, flank pain, decreased urine volume, difficulty urinating, genital sores and pelvic pain.  Musculoskeletal: Positive for arthralgias and gait problem. Negative for myalgias, back pain, joint swelling, neck pain and neck stiffness.       Left humerus head fx. Left arm swelling and massive ecchymoses   Skin: Positive for color change. Negative for pallor, rash and wound.  Massive ecchymoses left arm   Allergic/Immunologic: Positive for environmental allergies. Negative for food allergies and immunocompromised state.  Neurological: Negative for dizziness, tremors, seizures, syncope, facial asymmetry, speech difficulty, weakness, light-headedness, numbness and headaches.  Hematological: Negative for adenopathy. Does not bruise/bleed easily.    Psychiatric/Behavioral: Negative for hallucinations, behavioral problems, confusion, sleep disturbance, dysphoric mood, decreased concentration and agitation. The patient is not nervous/anxious and is not hyperactive.      Past Medical History  Diagnosis Date  . Sinus tachycardia   . Pneumonia   . Scoliosis   . TIA (transient ischemic attack)   . Trigeminal neuralgia of left side of face 12/13/2014  . Hot flashes 12/13/2014  . Acute on chronic diastolic heart failure   . Atrial fibrillation   . Lower extremity edema   . CHF (congestive heart failure)   . Shortness of breath dyspnea    Past Surgical History  Procedure Laterality Date  . Tonsillectomy     Social History:   reports that she has never smoked. She has never used smokeless tobacco. She reports that she does not drink alcohol or use illicit drugs.  Family History  Problem Relation Age of Onset  . Hypertension Father     Medications: Patient's Medications  New Prescriptions   No medications on file  Previous Medications   AMIODARONE (PACERONE) 200 MG TABLET    Take 200 mg by mouth daily.   APIXABAN (ELIQUIS) 2.5 MG TABS TABLET    Take 1 tablet (2.5 mg total) by mouth 2 (two) times daily.   ASCORBIC ACID (VITAMIN C PO)    Take 1 g by mouth 3 (three) times daily.    B COMPLEX VITAMINS TABLET    Take 0.5 tablets by mouth 3 (three) times daily.    BETA CAROTENE PO    Take 1 tablet by mouth daily.   CALCIUM PO    Take 1 tablet by mouth daily.   CHOLECALCIFEROL (VITAMIN D-3 PO)    Take 1 tablet by mouth daily.   CHROMIUM PO    Take 1 tablet by mouth daily.   CYANOCOBALAMIN (VITAMIN B 12 PO)    Take 1 tablet by mouth daily.   ESTRADIOL (ESTRACE) 1 MG TABLET    Take 0.5 mg by mouth daily.   FEXOFENADINE (ALLEGRA) 180 MG TABLET    Take 180 mg by mouth daily.   MAGNESIUM PO    Take 1 tablet by mouth daily.   MULTIPLE VITAMIN (MULTIVITAMIN WITH MINERALS) TABS TABLET    Take 1 tablet by mouth daily.   OXYCODONE (OXY  IR/ROXICODONE) 5 MG IMMEDIATE RELEASE TABLET    Take 1 tablet (5 mg total) by mouth every 6 (six) hours as needed for severe pain.   PREGABALIN (LYRICA) 25 MG CAPSULE    Take 50-75 mg by mouth 2 (two) times daily. Take 75 mg every morning and take 50 mg every evening   PROGESTERONE (PROMETRIUM) 100 MG CAPSULE    Take 100 mg by mouth every other day.    SELENIUM 50 MCG TABS TABLET    Take 50 mcg by mouth daily.   TORSEMIDE (DEMADEX) 20 MG TABLET    Pt takes 1 tab daily and may take 1 extra daily if weight gain of 2 lbs or more   VITAMIN A PO    Take 1 tablet by mouth daily.   VITAMIN E PO    Take 1 tablet by mouth daily.  Modified Medications   No medications on  file  Discontinued Medications   No medications on file     Physical Exam: Physical Exam  Constitutional: She is oriented to person, place, and time. She appears well-developed and well-nourished. No distress.  HENT:  Head: Normocephalic and atraumatic.  Right Ear: External ear normal.  Left Ear: External ear normal.  Nose: Nose normal.  Mouth/Throat: Oropharynx is clear and moist. No oropharyngeal exudate.  Eyes: Conjunctivae and EOM are normal. Pupils are equal, round, and reactive to light. Right eye exhibits no discharge. Left eye exhibits no discharge. No scleral icterus.  Neck: Normal range of motion. Neck supple. No JVD present. No tracheal deviation present. No thyromegaly present.  Cardiovascular: Normal rate, normal heart sounds and intact distal pulses.  Exam reveals no gallop and no friction rub.   No murmur heard. Irregular heart beats.   Pulmonary/Chest: Effort normal and breath sounds normal. No stridor. No respiratory distress. She has no wheezes. She has no rales. She exhibits no tenderness.  Abdominal: Soft. Bowel sounds are normal. She exhibits no distension and no mass. There is no tenderness. There is no rebound and no guarding.  Musculoskeletal: She exhibits edema and tenderness.  Left humerus fx-left arm  swelling. BLE 1+ chronic edema.   Neurological: She is alert and oriented to person, place, and time. She has normal reflexes. She displays normal reflexes. No cranial nerve deficit. She exhibits normal muscle tone. Coordination normal.  Skin: No rash noted. She is not diaphoretic. No erythema. No pallor.  Massive ecchymoses left arm  Psychiatric: She has a normal mood and affect. Her behavior is normal. Judgment and thought content normal.   Filed Vitals:   04/20/15 1129  BP: 118/60  Pulse: 102  Temp: 98.1 F (36.7 C)  TempSrc: Tympanic  Resp: 18      Labs reviewed: Basic Metabolic Panel:  Recent Labs  16/10/96 0427  02/04/15 1446 03/26/15 1617 04/17/15 0137  NA 135*  < > 137 138 137  K 4.2  < > 3.9 4.3 3.9  CL 97  < > 98 96 97  CO2 26  < > 35* 29 30  GLUCOSE 92  < > 142* 98 115*  BUN 15  < > 26* 23 27*  CREATININE 0.62  < > 0.82 0.98 0.91  CALCIUM 8.6  < > 8.6 8.8 8.3*  MG  --   --   --  2.0  --   TSH 3.490  --   --  6.835*  --   < > = values in this interval not displayed. Liver Function Tests:  Recent Labs  01/18/15 1715 01/20/15 0354 01/22/15 0844  AST 55* 38* 47*  ALT 65* 47* 40*  ALKPHOS 106 94 92  BILITOT 0.6 0.7 0.7  PROT 5.2* 5.1* 5.6*  ALBUMIN 2.9* 2.7* 3.0*   No results for input(s): LIPASE, AMYLASE in the last 8760 hours. No results for input(s): AMMONIA in the last 8760 hours. CBC:  Recent Labs  01/18/15 1715  01/20/15 0354 01/21/15 0544 04/17/15 0137  WBC 7.2  < > 6.9 7.8 9.8  NEUTROABS 5.1  --   --   --   --   HGB 11.2*  < > 12.1 12.8 11.0*  HCT 32.6*  < > 34.8* 37.0 31.7*  MCV 93.1  < > 90.9 91.1 92.7  PLT 184  < > 192 217 202  < > = values in this interval not displayed. Lipid Panel: No results for input(s): CHOL, HDL, LDLCALC, TRIG, CHOLHDL, LDLDIRECT  in the last 8760 hours.  Past Procedures:  04/17/15 CT head, cervical spine, maxillary   IMPRESSION: 1. No evidence of traumatic intracranial injury or fracture. 2. No  evidence of acute fracture or subluxation along the cervical spine. 3. No evidence of fracture or dislocation with regard to the maxillofacial structures. 4. Mild cortical volume loss and scattered small vessel ischemic microangiopathy. 5. Mild partial opacification of the left mastoid air cells. 6. Calcification at the carotid bifurcations bilaterally. 7. Mild degenerative change noted along the cervical spine.  04/17/15 X-ray thoracic spine:  IMPRESSION: No acute abnormality of the thoracic spine.  04/17/15 CXR   IMPRESSION: 1. Comminuted fracture involving the left humeral head and neck, with mild impaction and displacement of a greater tuberosity fragment. 2. No acute cardiopulmonary process seen. No displaced rib fractures identified.  Assessment/Plan Fracture of humeral head, left, closed 04/17/15 CXR   IMPRESSION: 1. Comminuted fracture involving the left humeral head and neck, with mild impaction and displacement of a greater tuberosity fragment. 2. No acute cardiopulmonary process seen. No displaced rib fractures Identified.  Prn Tylenol and Oxycodone available for pain  - conservative management initially. - 24/7 left shoulder immobilizer -follow up Dr. Eulah Pont office for further evaluation and decision on treatment       Paroxysmal atrial fibrillation Heart rate is in control. Takes Amiodarone  daily. Eliquis 2.5mg  bid for thromboembolism risk reduction.    Trigeminal neuralgia of left side of face Pain is managed with Lyrica  bid.    Hot flashes Takes Progesterone  qod and Estrace 0.5mg  daily.    Acute on chronic diastolic heart failure Clinically compensated, takes Trosemide  daily.    Other seasonal allergic rhinitis Takes allegra  daily.    HTN (hypertension) -HTN: patient to follow low sodium diet -continue low dose demadex     Family/ Staff Communication: observe the patient.   Goals of Care:  [place]  Labs/tests ordered: CBC and BMP

## 2015-04-20 NOTE — Assessment & Plan Note (Addendum)
04/17/15 CXR   IMPRESSION: 1. Comminuted fracture involving the left humeral head and neck, with mild impaction and displacement of a greater tuberosity fragment. 2. No acute cardiopulmonary process seen. No displaced rib fractures Identified.  Prn Tylenol and Oxycodone available for pain  - conservative management initially. - 24/7 left shoulder immobilizer -follow up Dr. Eulah PontMurphy office for further evaluation and decision on treatment

## 2015-04-20 NOTE — Assessment & Plan Note (Signed)
Heart rate is in control. Takes Amiodarone 200mg daily. Eliquis 2.5mg bid for thromboembolism risk reduction.   

## 2015-04-20 NOTE — Assessment & Plan Note (Signed)
Takes allegra 180mg daily 

## 2015-04-21 LAB — MDC_IDC_ENUM_SESS_TYPE_REMOTE: MDC IDC SESS DTM: 20160404060500

## 2015-04-23 ENCOUNTER — Other Ambulatory Visit: Payer: Self-pay | Admitting: Nurse Practitioner

## 2015-04-23 DIAGNOSIS — D62 Acute posthemorrhagic anemia: Secondary | ICD-10-CM | POA: Insufficient documentation

## 2015-04-24 ENCOUNTER — Encounter: Payer: Self-pay | Admitting: Internal Medicine

## 2015-04-24 ENCOUNTER — Non-Acute Institutional Stay: Payer: Medicare PPO | Admitting: Internal Medicine

## 2015-04-24 DIAGNOSIS — I10 Essential (primary) hypertension: Secondary | ICD-10-CM

## 2015-04-24 DIAGNOSIS — R0600 Dyspnea, unspecified: Secondary | ICD-10-CM | POA: Insufficient documentation

## 2015-04-24 DIAGNOSIS — I480 Paroxysmal atrial fibrillation: Secondary | ICD-10-CM

## 2015-04-24 DIAGNOSIS — S42292S Other displaced fracture of upper end of left humerus, sequela: Secondary | ICD-10-CM

## 2015-04-24 DIAGNOSIS — R2681 Unsteadiness on feet: Secondary | ICD-10-CM | POA: Diagnosis not present

## 2015-04-24 DIAGNOSIS — K9041 Non-celiac gluten sensitivity: Secondary | ICD-10-CM | POA: Insufficient documentation

## 2015-04-24 DIAGNOSIS — R600 Localized edema: Secondary | ICD-10-CM

## 2015-04-24 DIAGNOSIS — R531 Weakness: Secondary | ICD-10-CM | POA: Diagnosis not present

## 2015-04-24 DIAGNOSIS — W19XXXD Unspecified fall, subsequent encounter: Secondary | ICD-10-CM | POA: Diagnosis not present

## 2015-04-24 DIAGNOSIS — D62 Acute posthemorrhagic anemia: Secondary | ICD-10-CM

## 2015-04-24 DIAGNOSIS — I5032 Chronic diastolic (congestive) heart failure: Secondary | ICD-10-CM

## 2015-04-24 DIAGNOSIS — M4120 Other idiopathic scoliosis, site unspecified: Secondary | ICD-10-CM | POA: Insufficient documentation

## 2015-04-24 DIAGNOSIS — M5136 Other intervertebral disc degeneration, lumbar region: Secondary | ICD-10-CM | POA: Insufficient documentation

## 2015-04-24 NOTE — Progress Notes (Signed)
Patient ID: Melanie Obrien, female   DOB: 09/12/1919, 81 y.o.   MRN: [id number]    HISTORY AND PHYSICAL  Location:  [facility] Room Number: N37 Place of Service: SNF (31)   Extended Emergency Contact Information Primary Emergency Contact: Jerral Bonito [place] Phone: [phone] Work Phone: [phone] Mobile Phone: [phone] Relation: Daughter  Advanced Directive information Does patient have an advance directive?: No (Full code), Would patient like information on creating an advanced directive?: No - patient declined information  Chief Complaint  Patient presents with  . New Admit To SNF    Following evaluation in hospital emergency room    HPI:  Admission to skilled nursing facility 04/17/2015. Patient was seen in the hospital emergency room 04/17/2015 and assessed after a fall. She had a fracture that was comminuted of the left humeral head. She was referred to Dr. Fredonia Highland. Patient is known to have an unstable gait and using a walker regularly. Fall was a simple mechanical fall.  Patient is medically complicated and there are multiple active diagnoses. She has been under evaluation and treatment by [facility] cardiology, including Dr. Irish Lack and Dr. Caryl Comes. She had episodes of syncope and acute on chronic diastolic heart failure. She had hospitalizations in January 2016 and in December 2015. In December she had pneumonia and atrial fibrillation with rapid ventricular response as well as acute heart failure.  Patient is now in the skilled nursing facility for strengthening, training in safe mobility, follow-up of her anemia, and stabilization of other medical issues.  Past Medical History  Diagnosis Date  . Sinus tachycardia   . Pneumonia   . Scoliosis   . TIA (transient ischemic attack)   . Trigeminal neuralgia of left side of face 12/13/2014  . Hot flashes 12/13/2014  . Acute on chronic diastolic heart failure   . Atrial  fibrillation   . Lower extremity edema   . CHF (congestive heart failure)   . Shortness of breath dyspnea   . Unstable gait 04/24/2015  . Degenerative disc disease, lumbar 04/24/2015  . Idiopathic scoliosis 04/24/2015  . Weakness 04/24/2015  . Dyspnea 04/24/2015  . Gluten intolerance 04/24/2015    Past Surgical History  Procedure Laterality Date  . Tonsillectomy      Patient Care Team: Leighton Ruff, MD as PCP - General (Family Medicine) Deboraha Sprang, MD as Consulting Physician (Cardiology) Jettie Booze, MD as Consulting Physician (Interventional Cardiology) Renette Butters, MD as Attending Physician (Orthopedic Surgery)  History   Social History  . Marital Status: Widowed    Spouse Name: N/A  . Number of Children: N/A  . Years of Education: N/A   Occupational History  . Not on file.   Social History Main Topics  . Smoking status: Never Smoker   . Smokeless tobacco: Never Used  . Alcohol Use: No  . Drug Use: No  . Sexual Activity: Not on file   Other Topics Concern  . Not on file   Social History Narrative     reports that she has never smoked. She has never used smokeless tobacco. She reports that she does not drink alcohol or use illicit drugs.  Family History  Problem Relation Age of Onset  . Hypertension Father    Family Status  Relation Status Death Age  . Mother Deceased   . Father Deceased   . Daughter Alive      There is no immunization history on file for this  patient.  Allergies  Allergen Reactions  . Codeine Other (See Comments)    unknown  . Levaquin [Levofloxacin In D5w] Itching  . Loratadine     TACHYCARDIA  . Xylocaine [Lidocaine Hcl] Other (See Comments)    Uncontrolled shaking    Medications: Patient's Medications  New Prescriptions   No medications on file  Previous Medications   AMIODARONE (PACERONE) 200 MG TABLET    Take 200 mg by mouth daily.   APIXABAN (ELIQUIS) 2.5 MG TABS TABLET    Take 1 tablet (2.5 mg  total) by mouth 2 (two) times daily.   ASCORBIC ACID (VITAMIN C PO)    Take 1 g by mouth 3 (three) times daily.    B COMPLEX VITAMINS TABLET    Take 0.5 tablets by mouth 3 (three) times daily.    BETA CAROTENE PO    Take 1 tablet by mouth daily.   CALCIUM PO    Take 1 tablet by mouth daily.   CHOLECALCIFEROL (VITAMIN D-3 PO)    Take 1 tablet by mouth daily.   CHROMIUM PO    Take 1 tablet by mouth daily.   CYANOCOBALAMIN (VITAMIN B 12 PO)    Take 1 tablet by mouth daily.   ESTRADIOL (ESTRACE) 1 MG TABLET    Take 0.5 mg by mouth daily.   FEXOFENADINE (ALLEGRA) 180 MG TABLET    Take 180 mg by mouth daily.   MAGNESIUM PO    Take 1 tablet by mouth daily.   MULTIPLE VITAMIN (MULTIVITAMIN WITH MINERALS) TABS TABLET    Take 1 tablet by mouth daily.   OXYCODONE (OXY IR/ROXICODONE) 5 MG IMMEDIATE RELEASE TABLET    Take 1 tablet (5 mg total) by mouth every 6 (six) hours as needed for severe pain.   PREGABALIN (LYRICA) 25 MG CAPSULE    Take 50-75 mg by mouth 2 (two) times daily. Take 75 mg every morning and take 50 mg every evening   PROGESTERONE (PROMETRIUM) 100 MG CAPSULE    Take 100 mg by mouth every other day.    SELENIUM 50 MCG TABS TABLET    Take 50 mcg by mouth daily.   TORSEMIDE (DEMADEX) 20 MG TABLET    Pt takes 1 tab daily and may take 1 extra daily if weight gain of 2 lbs or more   VITAMIN A PO    Take 1 tablet by mouth daily.   VITAMIN E PO    Take 1 tablet by mouth daily.  Modified Medications   No medications on file  Discontinued Medications   No medications on file    Review of Systems  Constitutional: Positive for fatigue. Negative for fever, chills, diaphoresis, activity change, appetite change and unexpected weight change.  HENT: Positive for congestion and hearing loss. Negative for dental problem, drooling, ear discharge, ear pain, facial swelling, mouth sores, nosebleeds, postnasal drip, rhinorrhea, sinus pressure, sneezing, sore throat, tinnitus, trouble swallowing and voice  change.        Rhinitis.   Eyes: Negative for photophobia, pain, discharge, redness, itching and visual disturbance.  Respiratory: Negative for apnea, cough, choking, chest tightness, shortness of breath, wheezing and stridor.   Cardiovascular: Positive for leg swelling.  Gastrointestinal: Negative for nausea, vomiting, abdominal pain, diarrhea, constipation, blood in stool, abdominal distention, anal bleeding and rectal pain.       Patient states she has been gluten intolerant for the last couple of years.  Endocrine: Negative for cold intolerance, heat intolerance, polydipsia, polyphagia and polyuria.  Genitourinary: Positive for frequency. Negative for dysuria, urgency, hematuria, flank pain, decreased urine volume, difficulty urinating, genital sores and pelvic pain.  Musculoskeletal: Positive for arthralgias and gait problem. Negative for myalgias, back pain, joint swelling, neck pain and neck stiffness.       Left humerus head fx. Left arm swelling and massive ecchymoses   Skin: Positive for color change. Negative for pallor, rash and wound.       Massive ecchymoses left arm   Allergic/Immunologic: Positive for environmental allergies. Negative for food allergies and immunocompromised state.  Neurological: Negative for dizziness, tremors, seizures, syncope, facial asymmetry, speech difficulty, weakness, light-headedness, numbness and headaches.  Hematological: Negative for adenopathy. Does not bruise/bleed easily.  Psychiatric/Behavioral: Negative for hallucinations, behavioral problems, confusion, sleep disturbance, dysphoric mood, decreased concentration and agitation. The patient is not nervous/anxious and is not hyperactive.     Filed Vitals:   04/24/15 1154  BP: 142/79  Pulse: 73  Temp: 99.6 F (37.6 C)  Resp: 19  Height: _0  (1.575 m)  Weight: 102 lb (46.267 kg)  SpO2: 97%   Body mass index is 18.65 kg/(m^2).  Physical Exam  Constitutional: She is oriented to person,  place, and time. She appears well-developed and well-nourished. No distress.  HENT:  Head: Normocephalic and atraumatic.  Right Ear: External ear normal.  Left Ear: External ear normal.  Nose: Nose normal.  Mouth/Throat: Oropharynx is clear and moist. No oropharyngeal exudate.  Eyes: Conjunctivae and EOM are normal. Pupils are equal, round, and reactive to light. Right eye exhibits no discharge. Left eye exhibits no discharge. No scleral icterus.  Neck: Normal range of motion. Neck supple. No JVD present. No tracheal deviation present. No thyromegaly present.  Cardiovascular: Normal rate, normal heart sounds and intact distal pulses.  Exam reveals no gallop and no friction rub.   No murmur heard. Irregular heart beats.   Pulmonary/Chest: Effort normal. No stridor. No respiratory distress. She has no wheezes. She has rales. She exhibits no tenderness.  Abdominal: Soft. Bowel sounds are normal. She exhibits no distension and no mass. There is no tenderness. There is no rebound and no guarding.  Musculoskeletal: She exhibits edema (1+ bipedal) and tenderness.  Left humerus fx-left arm swelling. BLE 1+ chronic edema. Arm is in a sling.  Neurological: She is alert and oriented to person, place, and time. She has normal reflexes. She displays normal reflexes. No cranial nerve deficit. She exhibits normal muscle tone. Coordination normal.  Skin: No rash noted. She is not diaphoretic. No erythema. No pallor.  Massive ecchymoses left arm  Psychiatric: She has a normal mood and affect. Her behavior is normal. Judgment and thought content normal.     Labs reviewed: Lab on 04/23/2015  Component Date Value Ref Range Status  . Hemoglobin 04/20/2015 7.6* 12.0 - 16.0 g/dL Final  . HCT 04/20/2015 22* 36 - 46 % Final  . WBC 04/20/2015 12.0   Final  . Glucose 04/20/2015 97   Final  . BUN 04/20/2015 20  4 - 21 mg/dL Final  . Creatinine 04/20/2015 0.6  0.5 - 1.1 mg/dL Final  . Potassium 04/20/2015 3.7   3.4 - 5.3 mmol/L Final  . Sodium 04/20/2015 132* 137 - 147 mmol/L Final  Admission on 04/17/2015, Discharged on 04/17/2015  Component Date Value Ref Range Status  . WBC 04/17/2015 9.8  4.0 - 10.5 K/uL Final  . RBC 04/17/2015 3.42* 3.87 - 5.11 MIL/uL Final  . Hemoglobin 04/17/2015 11.0* 12.0 - 15.0 g/dL Final  .  HCT 04/17/2015 31.7* 36.0 - 46.0 % Final  . MCV 04/17/2015 92.7  78.0 - 100.0 fL Final  . MCH 04/17/2015 32.2  26.0 - 34.0 pg Final  . MCHC 04/17/2015 34.7  30.0 - 36.0 g/dL Final  . RDW 04/17/2015 15.8* 11.5 - 15.5 % Final  . Platelets 04/17/2015 202  150 - 400 K/uL Final  . Sodium 04/17/2015 137  135 - 145 mmol/L Final  . Potassium 04/17/2015 3.9  3.5 - 5.1 mmol/L Final  . Chloride 04/17/2015 97  96 - 112 mmol/L Final  . CO2 04/17/2015 30  19 - 32 mmol/L Final  . Glucose, Bld 04/17/2015 115* 70 - 99 mg/dL Final  . BUN 04/17/2015 27* 6 - 23 mg/dL Final  . Creatinine, Ser 04/17/2015 0.91  0.50 - 1.10 mg/dL Final  . Calcium 04/17/2015 8.3* 8.4 - 10.5 mg/dL Final  . GFR calc non Af Amer 04/17/2015 52* >90 mL/min Final  . GFR calc Af Amer 04/17/2015 61* >90 mL/min Final   Comment: (NOTE) The eGFR has been calculated using the CKD EPI equation. This calculation has not been validated in all clinical situations. eGFR's persistently <90 mL/min signify possible Chronic Kidney Disease.   . Anion gap 04/17/2015 10  5 - 15 Final  Office Visit on 03/26/2015  Component Date Value Ref Range Status  . Sodium 03/26/2015 138  135 - 145 mEq/L Final  . Potassium 03/26/2015 4.3  3.5 - 5.3 mEq/L Final  . Chloride 03/26/2015 96  96 - 112 mEq/L Final  . CO2 03/26/2015 29  19 - 32 mEq/L Final  . Glucose, Bld 03/26/2015 98  70 - 99 mg/dL Final  . BUN 03/26/2015 23  6 - 23 mg/dL Final  . Creat 03/26/2015 0.98  0.50 - 1.10 mg/dL Final  . Calcium 03/26/2015 8.8  8.4 - 10.5 mg/dL Final  . Magnesium 03/26/2015 2.0  1.5 - 2.5 mg/dL Final  . Date Time Interrogation Session 03/26/2015 [id number]    Final  . Implantable Pulse Generator Manufa* 03/26/2015 Medtronic   Final  . Implantable Pulse Generator Model 03/26/2015 [id number] Reveal LINQ   Final  . Implantable Pulse Generator Serial* 03/26/2015 [id number]   Final  . Zone Setting Type Category 03/26/2015 VF   Final  . Zone Setting Type Category 03/26/2015 VT   Final  . Zone Setting Type Category 03/26/2015 VENTRICULAR_TACHYCARDIA_1   Final  . Zone Setting Detection Interval 03/26/2015 430   Final  . Zone Setting Type Category 03/26/2015 VENTRICULAR_TACHYCARDIA_2   Final  . Zone Setting Type Category 03/26/2015 ATRIAL_FIBRILLATION   Final  . Zone Setting Type Category 03/26/2015 ATAF   Final  . Zone Setting Type Category 03/26/2015 ASYSTOLE   Final  . Zone Setting Detection Interval 03/26/2015 3000   Final  . Zone Setting Type Category 03/26/2015 BRADYCARDIA   Final  . Zone Setting Detection Interval 03/26/2015 2000   Final  . Battery Status 03/26/2015 OK   Final  . Eval Rhythm 03/26/2015 AF   Final  . Miscellaneous Comment 03/26/2015 Loop check in clinic.  Pt with 0 tachy episodes; 0 brady episodes; 8 asystole episodes---max dur. 4 sec (during AF); 0 symptom episodes; 50.2% AF burden + Eliquis.    Plan to continue [facility] and with SK in 47month.   Final  . TSH 03/26/2015 6.835* 0.350 - 4.500 uIU/mL Final  Clinical Support on 03/26/2015  Component Date Value Ref Range Status  . Date Time Interrogation Session 04/21/2015 [id number]  Final  .  Implantable Pulse Generator Manufa* 04/21/2015 Medtronic   Final  . Implantable Pulse Generator Model 04/21/2015 [id number] Reveal LINQ   Final  . Implantable Pulse Generator Serial* 04/21/2015 [id number]   Final  . Zone Setting Type Category 04/21/2015 VF   Final  . Zone Setting Type Category 04/21/2015 VT   Final  . Zone Setting Type Category 04/21/2015 VENTRICULAR_TACHYCARDIA_1   Final  . Zone Setting Type Category 04/21/2015 VENTRICULAR_TACHYCARDIA_2   Final  . Zone Setting Type Category  04/21/2015 ATRIAL_FIBRILLATION   Final  . Zone Setting Type Category 04/21/2015 ATAF   Final  . Zone Setting Type Category 04/21/2015 ASYSTOLE   Final  . Zone Setting Type Category 04/21/2015 BRADYCARDIA   Final  . Battery Status 04/21/2015 OK   Final  . Eval Rhythm 04/21/2015 SR   Final  . Miscellaneous Comment 04/21/2015    Final                   Value:Carelink summary report received. Battery status OK. Normal device function. No symptom episodes, tachy episodes, brady or pause episodes. 1 pause episode--previously reviewed. 715 AF episodes---47% of time. + Eliquis. Monthly summary reports and ROV in  April with SK.   Clinical Support on 02/25/2015  Component Date Value Ref Range Status  . Date Time Interrogation Session 02/25/2015 [id number]   Final  . Implantable Pulse Generator Manufa* 02/25/2015 Medtronic   Final  . Implantable Pulse Generator Model 02/25/2015 [id number] Reveal LINQ   Final  . Implantable Pulse Generator Serial* 02/25/2015 [id number]   Final  . Zone Setting Type Category 02/25/2015 VF   Final  . Zone Setting Type Category 02/25/2015 VT   Final  . Zone Setting Type Category 02/25/2015 VENTRICULAR_TACHYCARDIA_1   Final  . Zone Setting Type Category 02/25/2015 VENTRICULAR_TACHYCARDIA_2   Final  . Zone Setting Type Category 02/25/2015 ATRIAL_FIBRILLATION   Final  . Zone Setting Type Category 02/25/2015 ATAF   Final  . Zone Setting Type Category 02/25/2015 ASYSTOLE   Final  . Zone Setting Type Category 02/25/2015 BRADYCARDIA   Final  . Battery Status 02/25/2015 OK   Final  . Eval Rhythm 02/25/2015 AF   Final  . Miscellaneous Comment 02/25/2015    Final                   Value:Carelink summary report received. Battery status OK. Normal device function. No symptom episodes or brady episodes. 110 tachy episodes, 11 pause episodes, and 921 AF episodes--29.7% of time. + Eliquis. Episodes previously reviewed/printed out. Monthly  summary reports and ROV 04-08-15 @ 1600 with  SK.   Office Visit on 02/04/2015  Component Date Value Ref Range Status  . Sodium 02/04/2015 137  135 - 145 mEq/L Final  . Potassium 02/04/2015 3.9  3.5 - 5.1 mEq/L Final  . Chloride 02/04/2015 98  96 - 112 mEq/L Final  . CO2 02/04/2015 35* 19 - 32 mEq/L Final  . Glucose, Bld 02/04/2015 142* 70 - 99 mg/dL Final  . BUN 02/04/2015 26* 6 - 23 mg/dL Final  . Creatinine, Ser 02/04/2015 0.82  0.40 - 1.20 mg/dL Final  . Calcium 02/04/2015 8.6  8.4 - 10.5 mg/dL Final  . GFR 02/04/2015 68.99  >60.00 mL/min Final  . Pro B Natriuretic peptide (BNP) 02/04/2015 330.0* 0.0 - 100.0 pg/mL Final  Office Visit on 01/25/2015  Component Date Value Ref Range Status  . Implantable Pulse Generator Manufa* 01/25/2015 Medtronic   Final  . Implantable Pulse Generator Model 01/25/2015 VPX10 Reveal  LINQ   Final  . Implantable Pulse Generator Serial* 01/25/2015 [id number] S   Final  . Eval Rhythm 01/25/2015 AF   Final  . Miscellaneous Comment 01/25/2015 LINQ check in clinic. Battery status good. Normal device function. Rwave measurement 0.33 to 0.61m. Pt in AF 80.8% of time since last check on 01-13-15. + Plavix. 1 tachy episode recorded. Pt's daughter prefers not to hook up monitor. Follow up per SK.   Final    Dg Chest 1 View  04/17/2015   CLINICAL DATA:  Status post fall; concern for chest injury. Initial encounter.  EXAM: CHEST  1 VIEW  COMPARISON:  Chest radiograph performed 01/12/2015  FINDINGS: The lungs are well-aerated and clear. There is no evidence of focal opacification, pleural effusion or pneumothorax.  The cardiomediastinal silhouette is borderline normal in size. There is a comminuted fracture involving the left humeral head and neck, with mild impaction and displacement of a greater tuberosity fragment.  IMPRESSION: 1. Comminuted fracture involving the left humeral head and neck, with mild impaction and displacement of a greater tuberosity fragment. 2. No acute cardiopulmonary process seen. No displaced  rib fractures identified.   Electronically Signed   By: JGarald BaldingM.D.   On: 04/17/2015 02:37   Dg Thoracic Spine 2 View  04/17/2015   CLINICAL DATA:  Mid back pain after a fall.  EXAM: THORACIC SPINE - 2 VIEW  COMPARISON:  Chest x-rays dated 01/12/2015 and 12/24/2014  FINDINGS: There is no fracture or bone destruction. There is a thoracolumbar scoliosis which is unchanged. This is most severe in the lumbar spine.  IMPRESSION: No acute abnormality of the thoracic spine.   Electronically Signed   By: JLorriane ShireM.D.   On: 04/17/2015 02:36   Ct Head Wo Contrast  04/17/2015   CLINICAL DATA:  Status post fall. Fell face forward, impacting mainly on the left. Left jaw pain. Concern for head or cervical spine injury.  EXAM: CT HEAD WITHOUT CONTRAST  CT MAXILLOFACIAL WITHOUT CONTRAST  CT CERVICAL SPINE WITHOUT CONTRAST  TECHNIQUE: Multidetector CT imaging of the head, cervical spine, and maxillofacial structures were performed using the standard protocol without intravenous contrast. Multiplanar CT image reconstructions of the cervical spine and maxillofacial structures were also generated.  COMPARISON:  None.  FINDINGS: CT HEAD FINDINGS  There is no evidence of acute infarction, mass lesion, or intra- or extra-axial hemorrhage on CT.  Prominence of the ventricles and sulci reflects mild cortical volume loss. Mild cerebellar atrophy is noted. Scattered periventricular and subcortical white matter change likely reflects small vessel ischemic microangiopathy.  The brainstem and fourth ventricle are within normal limits. The basal ganglia are unremarkable in appearance. The cerebral hemispheres demonstrate grossly normal gray-white differentiation. No mass effect or midline shift is seen.  There is no evidence of fracture; visualized osseous structures are unremarkable in appearance. The orbits are within normal limits. The paranasal sinuses and right mastoid air cells are well-aerated. There is mild partial  opacification of the left mastoid air cells. No significant soft tissue abnormalities are seen.  CT MAXILLOFACIAL FINDINGS  There is no evidence of fracture or dislocation. The maxilla and mandible appear intact. The nasal bone is unremarkable in appearance. The visualized dentition demonstrates no acute abnormality.  The orbits are intact bilaterally. The visualized paranasal sinuses and mastoid air cells are well-aerated.  Calcification is noted at the carotid bifurcations bilaterally. The parapharyngeal fat planes are preserved. The nasopharynx, oropharynx and hypopharynx are unremarkable in appearance. The visualized portions of  the valleculae and piriform sinuses are grossly unremarkable.  The parotid and submandibular glands are within normal limits. No cervical lymphadenopathy is seen.  CT CERVICAL SPINE FINDINGS  There is no evidence of acute fracture or subluxation. There is grade 1 anterolisthesis of C4 on C5, and grade 1 anterolisthesis of C6 on C7, with intervertebral disc space loss at C5-C6. This reflects underlying facet disease. Prevertebral soft tissues are within normal limits. Mild degenerative change is noted at the dens.  The visualized portions of the thyroid gland are unremarkable in appearance. The visualized lung apices are clear. No significant soft tissue abnormalities are seen.  IMPRESSION: 1. No evidence of traumatic intracranial injury or fracture. 2. No evidence of acute fracture or subluxation along the cervical spine. 3. No evidence of fracture or dislocation with regard to the maxillofacial structures. 4. Mild cortical volume loss and scattered small vessel ischemic microangiopathy. 5. Mild partial opacification of the left mastoid air cells. 6. Calcification at the carotid bifurcations bilaterally. 7. Mild degenerative change noted along the cervical spine.   Electronically Signed   By: Garald Balding M.D.   On: 04/17/2015 03:52   Ct Cervical Spine Wo Contrast  04/17/2015    CLINICAL DATA:  Status post fall. Fell face forward, impacting mainly on the left. Left jaw pain. Concern for head or cervical spine injury.  EXAM: CT HEAD WITHOUT CONTRAST  CT MAXILLOFACIAL WITHOUT CONTRAST  CT CERVICAL SPINE WITHOUT CONTRAST  TECHNIQUE: Multidetector CT imaging of the head, cervical spine, and maxillofacial structures were performed using the standard protocol without intravenous contrast. Multiplanar CT image reconstructions of the cervical spine and maxillofacial structures were also generated.  COMPARISON:  None.  FINDINGS: CT HEAD FINDINGS  There is no evidence of acute infarction, mass lesion, or intra- or extra-axial hemorrhage on CT.  Prominence of the ventricles and sulci reflects mild cortical volume loss. Mild cerebellar atrophy is noted. Scattered periventricular and subcortical white matter change likely reflects small vessel ischemic microangiopathy.  The brainstem and fourth ventricle are within normal limits. The basal ganglia are unremarkable in appearance. The cerebral hemispheres demonstrate grossly normal gray-white differentiation. No mass effect or midline shift is seen.  There is no evidence of fracture; visualized osseous structures are unremarkable in appearance. The orbits are within normal limits. The paranasal sinuses and right mastoid air cells are well-aerated. There is mild partial opacification of the left mastoid air cells. No significant soft tissue abnormalities are seen.  CT MAXILLOFACIAL FINDINGS  There is no evidence of fracture or dislocation. The maxilla and mandible appear intact. The nasal bone is unremarkable in appearance. The visualized dentition demonstrates no acute abnormality.  The orbits are intact bilaterally. The visualized paranasal sinuses and mastoid air cells are well-aerated.  Calcification is noted at the carotid bifurcations bilaterally. The parapharyngeal fat planes are preserved. The nasopharynx, oropharynx and hypopharynx are  unremarkable in appearance. The visualized portions of the valleculae and piriform sinuses are grossly unremarkable.  The parotid and submandibular glands are within normal limits. No cervical lymphadenopathy is seen.  CT CERVICAL SPINE FINDINGS  There is no evidence of acute fracture or subluxation. There is grade 1 anterolisthesis of C4 on C5, and grade 1 anterolisthesis of C6 on C7, with intervertebral disc space loss at C5-C6. This reflects underlying facet disease. Prevertebral soft tissues are within normal limits. Mild degenerative change is noted at the dens.  The visualized portions of the thyroid gland are unremarkable in appearance. The visualized lung apices are clear.  No significant soft tissue abnormalities are seen.  IMPRESSION: 1. No evidence of traumatic intracranial injury or fracture. 2. No evidence of acute fracture or subluxation along the cervical spine. 3. No evidence of fracture or dislocation with regard to the maxillofacial structures. 4. Mild cortical volume loss and scattered small vessel ischemic microangiopathy. 5. Mild partial opacification of the left mastoid air cells. 6. Calcification at the carotid bifurcations bilaterally. 7. Mild degenerative change noted along the cervical spine.   Electronically Signed   By: Garald Balding M.D.   On: 04/17/2015 03:52   Dg Shoulder Left  04/17/2015   CLINICAL DATA:  Status post fall from standing, with injury to the left shoulder. Left shoulder pain, swelling and bruising. Limited range of motion. Initial encounter.  EXAM: LEFT SHOULDER - 2+ VIEW  COMPARISON:  None.  FINDINGS: There is a comminuted fracture involving the left humeral head and neck, with a displaced greater tuberosity fragment. There is no evidence of dislocation. Humeral head fragments remain seated at the glenoid fossa.  No additional fractures are seen. The visualized portions of the lungs are clear. The left acromioclavicular joint is unremarkable in appearance.   IMPRESSION: Comminuted fracture involving the left humeral head and neck, with displaced greater tuberosity fragment. No evidence of dislocation.   Electronically Signed   By: Garald Balding M.D.   On: 04/17/2015 01:28   Dg Hip Unilat With Pelvis 2-3 Views Left  04/17/2015   CLINICAL DATA:  Left hip pain after a fall.  EXAM: LEFT HIP (WITH PELVIS) 2-3 VIEWS  COMPARISON:  None.  FINDINGS: There is no fracture or dislocation. There are slight osteophytes on the left femoral head and on the acetabulum. Fairly severe degenerative disc disease in the lumbar spine with a scoliosis.  IMPRESSION: No acute abnormalities.   Electronically Signed   By: Lorriane Shire M.D.   On: 04/17/2015 02:34   Ct Maxillofacial Wo Cm  04/17/2015   CLINICAL DATA:  Status post fall. Fell face forward, impacting mainly on the left. Left jaw pain. Concern for head or cervical spine injury.  EXAM: CT HEAD WITHOUT CONTRAST  CT MAXILLOFACIAL WITHOUT CONTRAST  CT CERVICAL SPINE WITHOUT CONTRAST  TECHNIQUE: Multidetector CT imaging of the head, cervical spine, and maxillofacial structures were performed using the standard protocol without intravenous contrast. Multiplanar CT image reconstructions of the cervical spine and maxillofacial structures were also generated.  COMPARISON:  None.  FINDINGS: CT HEAD FINDINGS  There is no evidence of acute infarction, mass lesion, or intra- or extra-axial hemorrhage on CT.  Prominence of the ventricles and sulci reflects mild cortical volume loss. Mild cerebellar atrophy is noted. Scattered periventricular and subcortical white matter change likely reflects small vessel ischemic microangiopathy.  The brainstem and fourth ventricle are within normal limits. The basal ganglia are unremarkable in appearance. The cerebral hemispheres demonstrate grossly normal gray-white differentiation. No mass effect or midline shift is seen.  There is no evidence of fracture; visualized osseous structures are unremarkable  in appearance. The orbits are within normal limits. The paranasal sinuses and right mastoid air cells are well-aerated. There is mild partial opacification of the left mastoid air cells. No significant soft tissue abnormalities are seen.  CT MAXILLOFACIAL FINDINGS  There is no evidence of fracture or dislocation. The maxilla and mandible appear intact. The nasal bone is unremarkable in appearance. The visualized dentition demonstrates no acute abnormality.  The orbits are intact bilaterally. The visualized paranasal sinuses and mastoid air cells are well-aerated.  Calcification is noted at the carotid bifurcations bilaterally. The parapharyngeal fat planes are preserved. The nasopharynx, oropharynx and hypopharynx are unremarkable in appearance. The visualized portions of the valleculae and piriform sinuses are grossly unremarkable.  The parotid and submandibular glands are within normal limits. No cervical lymphadenopathy is seen.  CT CERVICAL SPINE FINDINGS  There is no evidence of acute fracture or subluxation. There is grade 1 anterolisthesis of C4 on C5, and grade 1 anterolisthesis of C6 on C7, with intervertebral disc space loss at C5-C6. This reflects underlying facet disease. Prevertebral soft tissues are within normal limits. Mild degenerative change is noted at the dens.  The visualized portions of the thyroid gland are unremarkable in appearance. The visualized lung apices are clear. No significant soft tissue abnormalities are seen.  IMPRESSION: 1. No evidence of traumatic intracranial injury or fracture. 2. No evidence of acute fracture or subluxation along the cervical spine. 3. No evidence of fracture or dislocation with regard to the maxillofacial structures. 4. Mild cortical volume loss and scattered small vessel ischemic microangiopathy. 5. Mild partial opacification of the left mastoid air cells. 6. Calcification at the carotid bifurcations bilaterally. 7. Mild degenerative change noted along the  cervical spine.   Electronically Signed   By: Garald Balding M.D.   On: 04/17/2015 03:52     Assessment/Plan  1. Fracture of humeral head, left, closed, sequela Patient is in a sling and is  Being treated conservatively. She is to see the orthopedist next week.  2. Acute blood loss anemia Significant drop in the Hgb on routine followup. There has not been observed blood loss except for the massive ecchymosis of the left arm. Hgb down to 7.6 -Add FeSO4 325 QD -CBC, Retic, TIBC, SI, folate, B12  3. Essential hypertension controlled  4. Fall, subsequent encounter -PT for gait training and strengthening  5. Unstable gait -PT for strengthening and gait training  6. Weakness -PT  7. Bilateral edema of lower extremity -observe  8. Chronic diastolic congestive heart failure compensated  9. Paroxysmal atrial fibrillation Currently with irregular rhythm. Recent reports from loop recorder suggest nearly 50% of the time, she is in AF.

## 2015-04-26 ENCOUNTER — Ambulatory Visit (INDEPENDENT_AMBULATORY_CARE_PROVIDER_SITE_OTHER): Payer: Medicare PPO | Admitting: *Deleted

## 2015-04-26 DIAGNOSIS — R55 Syncope and collapse: Secondary | ICD-10-CM | POA: Diagnosis not present

## 2015-04-26 LAB — CBC AND DIFFERENTIAL
HCT: 25 % — AB (ref 36–46)
Hemoglobin: 8.3 g/dL — AB (ref 12.0–16.0)
Platelets: 343 10*3/uL (ref 150–399)
WBC: 8.4 10*3/mL

## 2015-04-27 ENCOUNTER — Other Ambulatory Visit: Payer: Self-pay | Admitting: Nurse Practitioner

## 2015-04-27 DIAGNOSIS — D62 Acute posthemorrhagic anemia: Secondary | ICD-10-CM

## 2015-04-27 DIAGNOSIS — S42292D Other displaced fracture of upper end of left humerus, subsequent encounter for fracture with routine healing: Secondary | ICD-10-CM

## 2015-04-28 NOTE — Progress Notes (Signed)
Loop recorder 

## 2015-05-12 LAB — CUP PACEART REMOTE DEVICE CHECK: Date Time Interrogation Session: 20160503060500

## 2015-05-17 ENCOUNTER — Other Ambulatory Visit: Payer: Self-pay

## 2015-05-17 DIAGNOSIS — J302 Other seasonal allergic rhinitis: Secondary | ICD-10-CM

## 2015-05-17 LAB — TSH: TSH: 17.53 u[IU]/mL — AB (ref 0.41–5.90)

## 2015-05-17 MED ORDER — FEXOFENADINE HCL 180 MG PO TABS: 180.0000 mg | ORAL_TABLET | Freq: Every day | ORAL | Status: DC

## 2015-05-18 ENCOUNTER — Other Ambulatory Visit: Payer: Self-pay | Admitting: Nurse Practitioner

## 2015-05-18 ENCOUNTER — Non-Acute Institutional Stay: Payer: Medicare PPO | Admitting: Nurse Practitioner

## 2015-05-18 ENCOUNTER — Encounter: Payer: Self-pay | Admitting: Nurse Practitioner

## 2015-05-18 DIAGNOSIS — I482 Chronic atrial fibrillation, unspecified: Secondary | ICD-10-CM

## 2015-05-18 DIAGNOSIS — I480 Paroxysmal atrial fibrillation: Secondary | ICD-10-CM

## 2015-05-18 DIAGNOSIS — I5032 Chronic diastolic (congestive) heart failure: Secondary | ICD-10-CM | POA: Diagnosis not present

## 2015-05-18 DIAGNOSIS — J302 Other seasonal allergic rhinitis: Secondary | ICD-10-CM

## 2015-05-18 DIAGNOSIS — E039 Hypothyroidism, unspecified: Secondary | ICD-10-CM | POA: Insufficient documentation

## 2015-05-18 DIAGNOSIS — I10 Essential (primary) hypertension: Secondary | ICD-10-CM | POA: Diagnosis not present

## 2015-05-18 DIAGNOSIS — N951 Menopausal and female climacteric states: Secondary | ICD-10-CM

## 2015-05-18 DIAGNOSIS — G500 Trigeminal neuralgia: Secondary | ICD-10-CM

## 2015-05-18 DIAGNOSIS — G459 Transient cerebral ischemic attack, unspecified: Secondary | ICD-10-CM

## 2015-05-18 DIAGNOSIS — R232 Flushing: Secondary | ICD-10-CM

## 2015-05-18 DIAGNOSIS — S42292S Other displaced fracture of upper end of left humerus, sequela: Secondary | ICD-10-CM | POA: Diagnosis not present

## 2015-05-18 NOTE — Assessment & Plan Note (Signed)
04/17/15 CXR   IMPRESSION: 1. Comminuted fracture involving the left humeral head and neck, with mild impaction and displacement of a greater tuberosity fragment. 2. No acute cardiopulmonary process seen. No displaced rib fractures Identified.  04/26/15 Ortho: NWB of the LUE, in sling at all times

## 2015-05-18 NOTE — Assessment & Plan Note (Signed)
05/12/15 SLUMS 25/30 Takes ASA and Eliquis.

## 2015-05-18 NOTE — Assessment & Plan Note (Signed)
05/17/15 TSH 17.532 05/18/15 Levothyroxine 50mcg daily, TSH 8 weeks.

## 2015-05-18 NOTE — Progress Notes (Signed)
Patient ID: Melanie Obrien, female   DOB: [birth year], 81 y.o.   MRN: [id number]   Code Status: full code  Allergies  Allergen Reactions  . Codeine Other (See Comments)    unknown  . Levaquin [Levofloxacin In D5w] Itching  . Loratadine     TACHYCARDIA  . Xylocaine [Lidocaine Hcl] Other (See Comments)    Uncontrolled shaking    Chief Complaint  Patient presents with  . Medical Management of Chronic Issues  . Acute Visit    elevated TSH 17.532    HPI: Patient is a 81 y.o. female seen in the AL at [facility] today for evaluation of  Thyroid and chronic medical conditions.     Hospitalized 04/17/15 fro Humeral head fracture paroxysmal atrial fibrillation Chronic diastolic heart failure  Presented to ED for s/p fell. Landed on R side. Right sided shoulder pain and deformity after fall, worse with movement better with rest.        Problem List Items Addressed This Visit    Atrial fibrillation (Chronic)    Heart rate is in control. Takes Amiodarone 200mg  daily. Eliquis 2.5mg  bid for thromboembolism risk reduction.        Chronic diastolic congestive heart failure (Chronic)    Clinically compensated, takes Trosemide 20mg  daily.        Paroxysmal atrial fibrillation (Chronic)    Heart rate is in control. Takes Amiodarone 200mg  daily. Eliquis 2.5mg  bid for thromboembolism risk reduction.        Fracture of humeral head, left, closed (Chronic)    04/17/15 CXR   IMPRESSION: 1. Comminuted fracture involving the left humeral head and neck, with mild impaction and displacement of a greater tuberosity fragment. 2. No acute cardiopulmonary process seen. No displaced rib fractures Identified.  04/26/15 Ortho: NWB of the LUE, in sling at all times         HTN (hypertension) (Chronic)    -HTN: patient to follow low sodium diet -continue Torsemide       TIA (transient ischemic attack)    05/12/15 SLUMS 25/30 Takes ASA and Eliquis.       Trigeminal neuralgia of  left side of face    Pain is managed with Lyrica 50mg  bid.        Hot flashes    Takes Progesterone 100mg  qod and Estrace 0.5mg  daily.        Other seasonal allergic rhinitis    Takes allegra 180mg  daily      Hypothyroidism - Primary    05/17/15 TSH 17.532 05/18/15 Levothyroxine daily, TSH 8 weeks.           Review of Systems:  Review of Systems  Constitutional: Positive for fatigue. Negative for fever, chills, diaphoresis, activity change, appetite change and unexpected weight change.  HENT: Positive for hearing loss. Negative for congestion, dental problem, drooling, ear discharge, ear pain, facial swelling, mouth sores, nosebleeds, postnasal drip, rhinorrhea, sinus pressure, sneezing, sore throat, tinnitus, trouble swallowing and voice change.        Rhinitis.   Eyes: Negative for photophobia, pain, discharge, redness, itching and visual disturbance.  Respiratory: Negative for apnea, cough, choking, chest tightness, shortness of breath, wheezing and stridor.   Cardiovascular: Positive for leg swelling.       BLE chronic edema. LUE edematous from the fx of the left shoulder  Gastrointestinal: Negative for nausea, vomiting, abdominal pain, diarrhea, constipation, blood in stool, abdominal distention, anal bleeding and rectal pain.  Endocrine: Negative for cold intolerance, heat  intolerance, polydipsia, polyphagia and polyuria.  Genitourinary: Positive for frequency. Negative for dysuria, urgency, hematuria, flank pain, decreased urine volume, difficulty urinating, genital sores and pelvic pain.  Musculoskeletal: Positive for arthralgias and gait problem. Negative for myalgias, back pain, joint swelling, neck pain and neck stiffness.       Left humerus head fx. Left arm swelling and massive ecchymoses   Skin: Positive for color change. Negative for pallor, rash and wound.       Massive ecchymoses left arm   Allergic/Immunologic: Positive for environmental allergies.  Negative for food allergies and immunocompromised state.  Neurological: Negative for dizziness, tremors, seizures, syncope, facial asymmetry, speech difficulty, weakness, light-headedness, numbness and headaches.  Hematological: Negative for adenopathy. Does not bruise/bleed easily.  Psychiatric/Behavioral: Negative for hallucinations, behavioral problems, confusion, sleep disturbance, dysphoric mood, decreased concentration and agitation. The patient is not nervous/anxious and is not hyperactive.      Past Medical History  Diagnosis Date  . Sinus tachycardia   . Pneumonia   . Scoliosis   . TIA (transient ischemic attack)   . Trigeminal neuralgia of left side of face 12/13/2014  . Hot flashes 12/13/2014  . Acute on chronic diastolic heart failure   . Atrial fibrillation   . Lower extremity edema   . CHF (congestive heart failure)   . Shortness of breath dyspnea   . Unstable gait 04/24/2015  . Degenerative disc disease, lumbar 04/24/2015  . Idiopathic scoliosis 04/24/2015  . Weakness 04/24/2015  . Dyspnea 04/24/2015  . Gluten intolerance 04/24/2015   Past Surgical History  Procedure Laterality Date  . Tonsillectomy     Social History:   reports that she has never smoked. She has never used smokeless tobacco. She reports that she does not drink alcohol or use illicit drugs.  Family History  Problem Relation Age of Onset  . Hypertension Father     Medications: Patient's Medications  New Prescriptions   No medications on file  Previous Medications   AMIODARONE (PACERONE) 200 MG TABLET    Take 200 mg by mouth daily.   APIXABAN (ELIQUIS) 2.5 MG TABS TABLET    Take 1 tablet (2.5 mg total) by mouth 2 (two) times daily.   ASCORBIC ACID (VITAMIN C PO)    Take 1 g by mouth 3 (three) times daily.    B COMPLEX VITAMINS TABLET    Take 0.5 tablets by mouth 3 (three) times daily.    BETA CAROTENE PO    Take 1 tablet by mouth daily.   CALCIUM PO    Take 1 tablet by mouth daily.    CHOLECALCIFEROL (VITAMIN D-3 PO)    Take 1 tablet by mouth daily.   CHROMIUM PO    Take 1 tablet by mouth daily.   CYANOCOBALAMIN (VITAMIN B 12 PO)    Take 1 tablet by mouth daily.   ESTRADIOL (ESTRACE) 1 MG TABLET    Take 0.5 mg by mouth daily.   FEXOFENADINE (ALLEGRA) 180 MG TABLET    Take 1 tablet (180 mg total) by mouth daily.   MAGNESIUM PO    Take 1 tablet by mouth daily.   MULTIPLE VITAMIN (MULTIVITAMIN WITH MINERALS) TABS TABLET    Take 1 tablet by mouth daily.   OXYCODONE (OXY IR/ROXICODONE) 5 MG IMMEDIATE RELEASE TABLET    Take 1 tablet (5 mg total) by mouth every 6 (six) hours as needed for severe pain.   PREGABALIN (LYRICA) 25 MG CAPSULE    Take 50-75 mg by mouth 2 (  two) times daily. Take 75 mg every morning and take 50 mg every evening   PROGESTERONE (PROMETRIUM) 100 MG CAPSULE    Take 100 mg by mouth every other day.    SELENIUM 50 MCG TABS TABLET    Take 50 mcg by mouth daily.   TORSEMIDE (DEMADEX) 20 MG TABLET    Pt takes 1 tab daily and may take 1 extra daily if weight gain of 2 lbs or more   VITAMIN A PO    Take 1 tablet by mouth daily.   VITAMIN E PO    Take 1 tablet by mouth daily.  Modified Medications   No medications on file  Discontinued Medications   No medications on file     Physical Exam: Physical Exam  Constitutional: She is oriented to person, place, and time. She appears well-developed and well-nourished. No distress.  HENT:  Head: Normocephalic and atraumatic.  Right Ear: External ear normal.  Left Ear: External ear normal.  Nose: Nose normal.  Mouth/Throat: Oropharynx is clear and moist. No oropharyngeal exudate.  Eyes: Conjunctivae and EOM are normal. Pupils are equal, round, and reactive to light. Right eye exhibits no discharge. Left eye exhibits no discharge. No scleral icterus.  Neck: Normal range of motion. Neck supple. No JVD present. No tracheal deviation present. No thyromegaly present.  Cardiovascular: Normal rate, normal heart sounds and  intact distal pulses.  Exam reveals no gallop and no friction rub.   No murmur heard. Irregular heart beats.   Pulmonary/Chest: Effort normal and breath sounds normal. No stridor. No respiratory distress. She has no wheezes. She has no rales. She exhibits no tenderness.  Abdominal: Soft. Bowel sounds are normal. She exhibits no distension and no mass. There is no tenderness. There is no rebound and no guarding.  Musculoskeletal: She exhibits edema and tenderness.  Left humerus fx-left arm swelling. BLE 1+ chronic edema.   Neurological: She is alert and oriented to person, place, and time. She has normal reflexes. No cranial nerve deficit. She exhibits normal muscle tone. Coordination normal.  Skin: No rash noted. She is not diaphoretic. No erythema. No pallor.  Massive ecchymoses left arm  Psychiatric: She has a normal mood and affect. Her behavior is normal. Judgment and thought content normal.   Filed Vitals:   05/18/15 0957  BP: 104/62  Pulse: 94  Temp: 98.4 F (36.9 C)  TempSrc: Tympanic  Resp: 16      Labs reviewed: Basic Metabolic Panel:  Recent Labs  16/10/96 0427  02/04/15 1446 03/26/15 1617 04/17/15 0137 04/20/15 05/17/15  NA 135*  < > 137 138 137 132*  --   K 4.2  < > 3.9 4.3 3.9 3.7  --   CL 97  < > 98 96 97  --   --   CO2 26  < > 35* 29 30  --   --   GLUCOSE 92  < > 142* 98 115*  --   --   BUN 15  < > 26* 23 27* 20  --   CREATININE 0.62  < > 0.82 0.98 0.91 0.6  --   CALCIUM 8.6  < > 8.6 8.8 8.3*  --   --   MG  --   --   --  2.0  --   --   --   TSH 3.490  --   --  6.835*  --   --  17.53*  < > = values in this interval not displayed.  Liver Function Tests:  Recent Labs  01/18/15 1715 01/20/15 0354 01/22/15 0844  AST 55* 38* 47*  ALT 65* 47* 40*  ALKPHOS 106 94 92  BILITOT 0.6 0.7 0.7  PROT 5.2* 5.1* 5.6*  ALBUMIN 2.9* 2.7* 3.0*   No results for input(s): LIPASE, AMYLASE in the last 8760 hours. No results for input(s): AMMONIA in the last 8760  hours. CBC:  Recent Labs  01/18/15 1715  01/20/15 0354 01/21/15 0544 04/17/15 0137 04/20/15 04/26/15  WBC 7.2  < > 6.9 7.8 9.8 12.0 8.4  NEUTROABS 5.1  --   --   --   --   --   --   HGB 11.2*  < > 12.1 12.8 11.0* 7.6* 8.3*  HCT 32.6*  < > 34.8* 37.0 31.7* 22* 25*  MCV 93.1  < > 90.9 91.1 92.7  --   --   PLT 184  < > 192 217 202  --  343  < > = values in this interval not displayed. Lipid Panel: No results for input(s): CHOL, HDL, LDLCALC, TRIG, CHOLHDL, LDLDIRECT in the last 8760 hours.  Past Procedures:  04/17/15 CT head, cervical spine, maxillary   IMPRESSION: 1. No evidence of traumatic intracranial injury or fracture. 2. No evidence of acute fracture or subluxation along the cervical spine. 3. No evidence of fracture or dislocation with regard to the maxillofacial structures. 4. Mild cortical volume loss and scattered small vessel ischemic microangiopathy. 5. Mild partial opacification of the left mastoid air cells. 6. Calcification at the carotid bifurcations bilaterally. 7. Mild degenerative change noted along the cervical spine.  04/17/15 X-ray thoracic spine:  IMPRESSION: No acute abnormality of the thoracic spine.  04/17/15 CXR   IMPRESSION: 1. Comminuted fracture involving the left humeral head and neck, with mild impaction and displacement of a greater tuberosity fragment. 2. No acute cardiopulmonary process seen. No displaced rib fractures identified.  Assessment/Plan Hypothyroidism 05/17/15 TSH 17.532 05/18/15 Levothyroxine 50mcg daily, TSH 8 weeks.     Hot flashes Takes Progesterone 100mg  qod and Estrace 0.5mg  daily.     Other seasonal allergic rhinitis Takes allegra 180mg  daily   Paroxysmal atrial fibrillation Heart rate is in control. Takes Amiodarone 200mg  daily. Eliquis 2.5mg  bid for thromboembolism risk reduction.     Atrial fibrillation Heart rate is in control. Takes Amiodarone 200mg  daily. Eliquis 2.5mg  bid for thromboembolism  risk reduction.     Chronic diastolic congestive heart failure Clinically compensated, takes Trosemide 20mg  daily.     Fracture of humeral head, left, closed 04/17/15 CXR   IMPRESSION: 1. Comminuted fracture involving the left humeral head and neck, with mild impaction and displacement of a greater tuberosity fragment. 2. No acute cardiopulmonary process seen. No displaced rib fractures Identified.  04/26/15 Ortho: NWB of the LUE, in sling at all times      HTN (hypertension) -HTN: patient to follow low sodium diet -continue Torsemide    Trigeminal neuralgia of left side of face Pain is managed with Lyrica 50mg  bid.     TIA (transient ischemic attack) 05/12/15 SLUMS 25/30 Takes ASA and Eliquis.      Family/ Staff Communication: observe the patient.   Goals of Care: AL  Labs/tests ordered: TSH 8 wks.

## 2015-05-18 NOTE — Assessment & Plan Note (Signed)
Clinically compensated, takes Trosemide 20mg  daily.

## 2015-05-18 NOTE — Assessment & Plan Note (Signed)
Pain is managed with Lyrica 50mg  bid.

## 2015-05-18 NOTE — Assessment & Plan Note (Signed)
Heart rate is in control. Takes Amiodarone 200mg daily. Eliquis 2.5mg bid for thromboembolism risk reduction.   

## 2015-05-18 NOTE — Assessment & Plan Note (Addendum)
-  HTN: patient to follow low sodium diet -continue Torsemide

## 2015-05-18 NOTE — Assessment & Plan Note (Signed)
Takes allegra 180mg  daily

## 2015-05-18 NOTE — Assessment & Plan Note (Signed)
Heart rate is in control. Takes Amiodarone 200mg  daily. Eliquis 2.5mg  bid for thromboembolism risk reduction.

## 2015-05-18 NOTE — Assessment & Plan Note (Signed)
Takes Progesterone 100mg  qod and Estrace 0.5mg  daily.

## 2015-05-19 ENCOUNTER — Encounter: Payer: Self-pay | Admitting: Internal Medicine

## 2015-05-26 ENCOUNTER — Ambulatory Visit: Payer: Medicare PPO | Admitting: *Deleted

## 2015-05-28 ENCOUNTER — Encounter: Payer: Self-pay | Admitting: Cardiology

## 2015-05-31 ENCOUNTER — Encounter: Payer: Self-pay | Admitting: Internal Medicine

## 2015-06-02 ENCOUNTER — Encounter: Payer: Self-pay | Admitting: Internal Medicine

## 2015-06-04 ENCOUNTER — Encounter: Payer: Self-pay | Admitting: Cardiology

## 2015-06-07 ENCOUNTER — Telehealth: Payer: Self-pay | Admitting: Internal Medicine

## 2015-06-07 ENCOUNTER — Ambulatory Visit (INDEPENDENT_AMBULATORY_CARE_PROVIDER_SITE_OTHER): Payer: Medicare PPO | Admitting: *Deleted

## 2015-06-07 ENCOUNTER — Telehealth: Payer: Self-pay

## 2015-06-07 DIAGNOSIS — I480 Paroxysmal atrial fibrillation: Secondary | ICD-10-CM

## 2015-06-07 DIAGNOSIS — R55 Syncope and collapse: Secondary | ICD-10-CM

## 2015-06-07 NOTE — Telephone Encounter (Signed)
Dr. Eldridge Dace  Recommend Torsemide 80 mg for one day. If patient does not improve by tomorrow, she will need to go to the hospital. Called left message for patient's daughter to call back.

## 2015-06-07 NOTE — Telephone Encounter (Signed)
Spoke to Dr.McNeill. Patient appt made for today @1500 .

## 2015-06-07 NOTE — Telephone Encounter (Signed)
Confirmed fax number. Will fax order.

## 2015-06-07 NOTE — Telephone Encounter (Signed)
Joni Reining from [facility] calling to see if fax has been sent-waiting to get it filled-pls advise [phone]

## 2015-06-07 NOTE — Telephone Encounter (Addendum)
Patient's daughter Dr. Corliss Blacker is calling about her mother. Patient has a history of CHF. Patient has gained 6 lbs in the last week, has BLE edema, SOB, and low heart rate for patient 60 - 65. Patient lives at assisted living. Dr. Corliss Blacker wants patient seen at the office. Consulted Dr. Eldridge Dace, her primary cardiologist. He instructed patient to take an increase dose of Torsemide 40 mg daily for three days and get a BMET on Friday. Called Dr. Corliss Blacker back, she informed the office that her mother is already taking Torsemide 40mg  for the last couple of days. Patient has had little response with increased dose and has decreased urine output. Will consult Dr. Eldridge Dace for instructions.

## 2015-06-07 NOTE — Telephone Encounter (Signed)
Follow Up    Joni Reining is calling in because the pt daughter stated that she has spoken Dr. Graciela Husbands and that a new Order has been confirmed for  Torsemide.  Joni Reining needs and extra 40mg ;  that will giver her a Total 80 mg a day.   Fax number:  [phone]

## 2015-06-07 NOTE — Telephone Encounter (Signed)
New Message     Pt c/o Shortness Of Breath: STAT if SOB developed within the last 24 hours or pt is noticeably SOB on the phone  1. Are you currently SOB (can you hear that pt is SOB on the phone)?  YES  2. How long have you been experiencing SOB?  Last 24 hours  3. Are you SOB when sitting or when up moving around? SOB when pt is at rest Up [address]X  4. Are you currently experiencing any other symptoms? Low extremity endemia,    Her last BP 140/70     Hr was 63

## 2015-06-07 NOTE — Telephone Encounter (Signed)
Dr. Corliss Blacker is calling about her mother's device and wants to have it checked.  Levora Angel in EP has some opening this afternoon. Will forward message for her to call and set up appointment for today.

## 2015-06-08 LAB — CUP PACEART REMOTE DEVICE CHECK: Date Time Interrogation Session: 20160515060500

## 2015-06-09 ENCOUNTER — Encounter: Payer: Self-pay | Admitting: Cardiology

## 2015-06-09 ENCOUNTER — Emergency Department (HOSPITAL_COMMUNITY): Payer: Medicare PPO

## 2015-06-09 ENCOUNTER — Inpatient Hospital Stay (HOSPITAL_COMMUNITY)
Admission: EM | Admit: 2015-06-09 | Discharge: 2015-06-11 | DRG: 293 | Disposition: A | Discharge status: Nursing Home (Includes ICF) | Payer: Medicare PPO | Attending: Internal Medicine | Admitting: Internal Medicine

## 2015-06-09 ENCOUNTER — Encounter (HOSPITAL_COMMUNITY): Payer: Self-pay | Admitting: *Deleted

## 2015-06-09 ENCOUNTER — Telehealth: Payer: Self-pay | Admitting: Interventional Cardiology

## 2015-06-09 DIAGNOSIS — M4120 Other idiopathic scoliosis, site unspecified: Secondary | ICD-10-CM | POA: Diagnosis present

## 2015-06-09 DIAGNOSIS — I5021 Acute systolic (congestive) heart failure: Secondary | ICD-10-CM

## 2015-06-09 DIAGNOSIS — K900 Celiac disease: Secondary | ICD-10-CM

## 2015-06-09 DIAGNOSIS — R001 Bradycardia, unspecified: Secondary | ICD-10-CM | POA: Diagnosis present

## 2015-06-09 DIAGNOSIS — I10 Essential (primary) hypertension: Secondary | ICD-10-CM | POA: Diagnosis present

## 2015-06-09 DIAGNOSIS — I5033 Acute on chronic diastolic (congestive) heart failure: Secondary | ICD-10-CM | POA: Diagnosis not present

## 2015-06-09 DIAGNOSIS — R600 Localized edema: Secondary | ICD-10-CM | POA: Diagnosis present

## 2015-06-09 DIAGNOSIS — Z8673 Personal history of transient ischemic attack (TIA), and cerebral infarction without residual deficits: Secondary | ICD-10-CM

## 2015-06-09 DIAGNOSIS — I480 Paroxysmal atrial fibrillation: Secondary | ICD-10-CM

## 2015-06-09 DIAGNOSIS — Z8249 Family history of ischemic heart disease and other diseases of the circulatory system: Secondary | ICD-10-CM

## 2015-06-09 DIAGNOSIS — I110 Hypertensive heart disease with heart failure: Secondary | ICD-10-CM | POA: Diagnosis present

## 2015-06-09 DIAGNOSIS — M5136 Other intervertebral disc degeneration, lumbar region: Secondary | ICD-10-CM | POA: Diagnosis present

## 2015-06-09 DIAGNOSIS — R946 Abnormal results of thyroid function studies: Secondary | ICD-10-CM | POA: Diagnosis present

## 2015-06-09 DIAGNOSIS — R0602 Shortness of breath: Secondary | ICD-10-CM | POA: Diagnosis present

## 2015-06-09 DIAGNOSIS — I5043 Acute on chronic combined systolic (congestive) and diastolic (congestive) heart failure: Principal | ICD-10-CM | POA: Diagnosis present

## 2015-06-09 DIAGNOSIS — I5032 Chronic diastolic (congestive) heart failure: Secondary | ICD-10-CM

## 2015-06-09 DIAGNOSIS — K9041 Non-celiac gluten sensitivity: Secondary | ICD-10-CM | POA: Diagnosis present

## 2015-06-09 DIAGNOSIS — I509 Heart failure, unspecified: Secondary | ICD-10-CM | POA: Diagnosis not present

## 2015-06-09 HISTORY — DX: Unspecified osteoarthritis, unspecified site: M19.90

## 2015-06-09 HISTORY — DX: Paroxysmal atrial fibrillation: I48.0

## 2015-06-09 HISTORY — DX: Bradycardia, unspecified: R00.1

## 2015-06-09 LAB — CBC
HEMATOCRIT: 35.6 % — AB (ref 36.0–46.0)
HEMOGLOBIN: 12.1 g/dL (ref 12.0–15.0)
MCH: 32.4 pg (ref 26.0–34.0)
MCHC: 34 g/dL (ref 30.0–36.0)
MCV: 95.2 fL (ref 78.0–100.0)
Platelets: 218 10*3/uL (ref 150–400)
RBC: 3.74 MIL/uL — ABNORMAL LOW (ref 3.87–5.11)
RDW: 14.2 % (ref 11.5–15.5)
WBC: 9.9 10*3/uL (ref 4.0–10.5)

## 2015-06-09 LAB — BASIC METABOLIC PANEL
Anion gap: 11 (ref 5–15)
BUN: 21 mg/dL — ABNORMAL HIGH (ref 6–20)
CO2: 29 mmol/L (ref 22–32)
Calcium: 8.4 mg/dL — ABNORMAL LOW (ref 8.9–10.3)
Chloride: 95 mmol/L — ABNORMAL LOW (ref 101–111)
Creatinine, Ser: 0.94 mg/dL (ref 0.44–1.00)
GFR calc Af Amer: 58 mL/min — ABNORMAL LOW (ref >60)
GFR calc non Af Amer: 50 mL/min — ABNORMAL LOW (ref >60)
GLUCOSE: 119 mg/dL — AB (ref 65–99)
POTASSIUM: 4 mmol/L (ref 3.5–5.1)
Sodium: 135 mmol/L (ref 135–145)

## 2015-06-09 LAB — TROPONIN I: Troponin I: <0.03 ng/mL (ref <0.031)

## 2015-06-09 LAB — BRAIN NATRIURETIC PEPTIDE: B Natriuretic Peptide: 369.6 pg/mL — ABNORMAL HIGH (ref 0.0–100.0)

## 2015-06-09 LAB — I-STAT TROPONIN, ED: Troponin i, poc: 0.01 ng/mL (ref 0.00–0.08)

## 2015-06-09 MED ORDER — ESTRADIOL 1 MG PO TABS
0.5000 mg | ORAL_TABLET | Freq: Every day | ORAL | Status: DC
  Administered 2015-06-10 – 2015-06-11 (×2): 0.5 mg via ORAL
  Filled 2015-06-09 (×2): qty 0.5

## 2015-06-09 MED ORDER — SODIUM CHLORIDE 0.9 % IV SOLN: 250.0000 mL | INTRAVENOUS | Status: DC | PRN

## 2015-06-09 MED ORDER — PROGESTERONE MICRONIZED 100 MG PO CAPS
100.0000 mg | ORAL_CAPSULE | ORAL | Status: DC
  Filled 2015-06-09: qty 1

## 2015-06-09 MED ORDER — ONDANSETRON HCL 4 MG/2ML IJ SOLN: 4.0000 mg | Freq: Four times a day (QID) | INTRAMUSCULAR | Status: DC | PRN

## 2015-06-09 MED ORDER — PREGABALIN 25 MG PO CAPS
50.0000 mg | ORAL_CAPSULE | Freq: Two times a day (BID) | ORAL | Status: DC
  Administered 2015-06-09 – 2015-06-11 (×4): 50 mg via ORAL
  Filled 2015-06-09 (×4): qty 2

## 2015-06-09 MED ORDER — FERROUS SULFATE 325 (65 FE) MG PO TABS
325.0000 mg | ORAL_TABLET | Freq: Every day | ORAL | Status: DC
  Administered 2015-06-09 – 2015-06-11 (×3): 325 mg via ORAL
  Filled 2015-06-09 (×3): qty 1

## 2015-06-09 MED ORDER — B COMPLEX-C PO TABS
1.0000 | ORAL_TABLET | Freq: Every day | ORAL | Status: DC
  Administered 2015-06-10 – 2015-06-11 (×2): 1 via ORAL
  Filled 2015-06-09 (×2): qty 1

## 2015-06-09 MED ORDER — ADULT MULTIVITAMIN W/MINERALS CH
1.0000 | ORAL_TABLET | Freq: Every day | ORAL | Status: DC
  Administered 2015-06-10 – 2015-06-11 (×2): 1 via ORAL
  Filled 2015-06-09 (×2): qty 1

## 2015-06-09 MED ORDER — ACETAMINOPHEN 325 MG PO TABS: 650.0000 mg | ORAL_TABLET | ORAL | Status: DC | PRN

## 2015-06-09 MED ORDER — SODIUM CHLORIDE 0.9 % IJ SOLN
3.0000 mL | Freq: Two times a day (BID) | INTRAMUSCULAR | Status: DC
  Administered 2015-06-09 – 2015-06-11 (×4): 3 mL via INTRAVENOUS

## 2015-06-09 MED ORDER — ENSURE ENLIVE PO LIQD: 237.0000 mL | Freq: Once | ORAL | Status: DC

## 2015-06-09 MED ORDER — SODIUM CHLORIDE 0.9 % IJ SOLN: 3.0000 mL | INTRAMUSCULAR | Status: DC | PRN

## 2015-06-09 MED ORDER — AMIODARONE HCL 200 MG PO TABS
200.0000 mg | ORAL_TABLET | Freq: Every day | ORAL | Status: DC
  Administered 2015-06-11: 200 mg via ORAL
  Filled 2015-06-09 (×2): qty 1

## 2015-06-09 MED ORDER — APIXABAN 2.5 MG PO TABS
2.5000 mg | ORAL_TABLET | Freq: Two times a day (BID) | ORAL | Status: DC
  Administered 2015-06-09 – 2015-06-11 (×4): 2.5 mg via ORAL
  Filled 2015-06-09 (×5): qty 1

## 2015-06-09 NOTE — ED Notes (Signed)
Pt in with family reporting weight gain over the weekend of 6lbs, pt has history of CHF, was given extra medication yesterday and lost the weight, over night gained 4lbs, this morning pt reports headache and states her head feels full, shortness of breath with exertion was worse today, no distress noted at this time

## 2015-06-09 NOTE — Progress Notes (Signed)
Received pt report from Brittany,RN-ED. 

## 2015-06-09 NOTE — ED Notes (Signed)
Pt did not answer.

## 2015-06-09 NOTE — ED Notes (Addendum)
Pt requesting Boost or Ensure; also reports if a tray is ordered, it must be gluten-free. Cardiology aware; contacted pharmacy to verify Boost is gluten free

## 2015-06-09 NOTE — Telephone Encounter (Signed)
Patient's daughter, Dr. [name], calling back in today to report that patient is still retaining fluid even after intervention on Monday with Torsemide. Patient was 106# on Monday, 101# on Tuesday and again 106# today. Sx include SOB, HR 57 (strong and regular), chest heaviness and lightheadedness. States her eyes/face has been puffy this week due to the fluid retention. States BP 140/60. Plan from Monday per Dr. Eldridge Dace was that if Torsemide was not effective, patient should proceed to the ED to be evaluation/treated for fluid overload. Patient advised to go to ED. Contacted Trish/Cardmaster at [facility] ED to let her know that patient would be coming to ED today from [facility]. Notified Dr. [name] and she is arranging transportation for patient to get to [facility] ED this afternoon. Routing to Dr. Eldridge Dace as fyi.

## 2015-06-09 NOTE — Progress Notes (Signed)
Melanie Obrien [id number] Admission Data: 06/09/2015 9:36 PM Attending Provider: Chrystie Nose, MD IRJ:[email or web address],[id number] Roseanne Reno, MD Code Status: DNR  Melanie Obrien is a 81 y.o. female patient admitted from ED:  -No acute distress noted.  -No complaints of shortness of breath.  -No complaints of chest pain.   Cardiac Monitoring: Box # 30 in place. Cardiac monitor yields: atrial fib  Blood pressure 160/43, pulse 67, temperature 98 F (36.7 C), temperature source Oral, resp. rate 16, height 4' 7.5" (1.41 m), weight 46.448 kg (102 lb 6.4 oz), SpO2 98 %.   IV Fluids:  IV in place, occlusive dsg intact without redness, IV cath wrist right, condition patent and no redness none.   Allergies:  Codeine; Levaquin; Loratadine; and Xylocaine  Past Medical History:   has a past medical history of Sinus tachycardia; Pneumonia; Scoliosis; TIA (transient ischemic attack); Trigeminal neuralgia of left side of face (12/13/2014); Hot flashes (12/13/2014); Acute on chronic diastolic heart failure; Atrial fibrillation; Lower extremity edema; CHF (congestive heart failure); Shortness of breath dyspnea; Unstable gait (04/24/2015); Degenerative disc disease, lumbar (04/24/2015); Idiopathic scoliosis (04/24/2015); Weakness (04/24/2015); Dyspnea (04/24/2015); and Gluten intolerance (04/24/2015).  Past Surgical History:   has past surgical history that includes Tonsillectomy.  Social History:   reports that she has never smoked. She has never used smokeless tobacco. She reports that she does not drink alcohol or use illicit drugs.  Skin: NSI  Patient/Family orientated to room. Information packet given to patient/family. Admission inpatient armband information verified with patient/family to include name and date of birth and placed on patient arm. Side rails up x 2, fall assessment and education completed with patient/family. Patient/family able to verbalize understanding of risk associated with falls and verbalized  understanding to call for assistance before getting out of bed. Call light within reach. Patient/family able to voice and demonstrate understanding of unit orientation instructions.

## 2015-06-09 NOTE — H&P (Signed)
ADMISSION HISTORY & PHYSICAL   Chief Complaint:  Shortness of breath, fatigue, confusion  Cardiologist: Dr. Irish Lack  Primary Care Physician: Gerrit Heck, MD  HPI:  This is a 81 y.o. female with a past medical history significant for paroxysmal atrial fibrillation and diastolic heart failure (LVEF 55-60%). She was admitted in December 2015 for similar symptoms. She is on amiodarone however there is concern for bradycardia. She had a LINQ monitor placed in [place] a few years ago for asymptomatic 3-4 second pauses. This continues to be monitored although no indication for pacemaker is been noted yet. I spoke with her daughter, Dr. Leonides Schanz at some length today who feels that her bradycardia may be contributing to her exacerbations, as she's had several episodes with heart rates in the 40s. They have seen Dr. Caryl Comes for this recently in April 2016. Most of the events on the monitor are post termination pauses. Recently she had a mechanical fall and bruised her left hip and has pain in her left shoulder. She was found to have a left proximal humerus fracture for which nonoperative management was recommended.   She is admitted today for progressive shortness of breath and weight gain. She was advised to take an extra 80 mg of torsemide which cause a 5 pound weight loss however she gained that weight back just the next day. She did feel significantly better after diuresis. She is since had progressive weight gain and shortness of breath along with lethargy and some confusion. She was administered another high dose of torsemide today with some improvement and is currently able to lay flat without shortness of breath. Her chest x-ray shows no pulmonary edema, however BNP is elevated at 369. Troponin is negative 2. EKG shows an irregularly irregular rhythm without clear P waves which is likely atrial fibrillation.  PMHx:  Past Medical History  Diagnosis Date  . Sinus tachycardia   .  Pneumonia   . Scoliosis   . TIA (transient ischemic attack)   . Trigeminal neuralgia of left side of face 12/13/2014  . Hot flashes 12/13/2014  . Acute on chronic diastolic heart failure   . Atrial fibrillation   . Lower extremity edema   . CHF (congestive heart failure)   . Shortness of breath dyspnea   . Unstable gait 04/24/2015  . Degenerative disc disease, lumbar 04/24/2015  . Idiopathic scoliosis 04/24/2015  . Weakness 04/24/2015  . Dyspnea 04/24/2015  . Gluten intolerance 04/24/2015    Past Surgical History  Procedure Laterality Date  . Tonsillectomy      FAMHx:  Family History  Problem Relation Age of Onset  . Hypertension Father     SOCHx:   reports that she has never smoked. She has never used smokeless tobacco. She reports that she does not drink alcohol or use illicit drugs.  ALLERGIES:  Allergies  Allergen Reactions  . Codeine Other (See Comments)    unknown  . Levaquin [Levofloxacin In D5w] Itching  . Loratadine     TACHYCARDIA  . Xylocaine [Lidocaine Hcl] Other (See Comments)    Uncontrolled shaking    ROS: A comprehensive review of systems was negative except for: Constitutional: positive for fatigue and weight gain Respiratory: positive for dyspnea on exertion Cardiovascular: positive for irregular heart beat  HOME MEDS:  (Not in a hospital admission)  LABS/IMAGING: Results for orders placed or performed during the hospital encounter of 06/09/15 (from the past 48 hour(s))  CBC     Status: Abnormal   Collection  Time: 06/09/15  3:34 PM  Result Value Ref Range   WBC 9.9 4.0 - 10.5 K/uL   RBC 3.74 (L) 3.87 - 5.11 MIL/uL   Hemoglobin 12.1 12.0 - 15.0 g/dL   HCT 35.6 (L) 36.0 - 46.0 %   MCV 95.2 78.0 - 100.0 fL   MCH 32.4 26.0 - 34.0 pg   MCHC 34.0 30.0 - 36.0 g/dL   RDW 14.2 11.5 - 15.5 %   Platelets 218 150 - 400 K/uL  Basic metabolic panel     Status: Abnormal   Collection Time: 06/09/15  3:34 PM  Result Value Ref Range   Sodium 135 135 -  145 mmol/L   Potassium 4.0 3.5 - 5.1 mmol/L   Chloride 95 (L) 101 - 111 mmol/L   CO2 29 22 - 32 mmol/L   Glucose, Bld 119 (H) 65 - 99 mg/dL   BUN 21 (H) 6 - 20 mg/dL   Creatinine, Ser 0.94 0.44 - 1.00 mg/dL   Calcium 8.4 (L) 8.9 - 10.3 mg/dL   GFR calc non Af Amer 50 (L) >60 mL/min   GFR calc Af Amer 58 (L) >60 mL/min    Comment: (NOTE) The eGFR has been calculated using the CKD EPI equation. This calculation has not been validated in all clinical situations. eGFR's persistently <60 mL/min signify possible Chronic Kidney Disease.    Anion gap 11 5 - 15  BNP (order ONLY if patient complains of dyspnea/SOB AND you have documented it for THIS visit)     Status: Abnormal   Collection Time: 06/09/15  3:34 PM  Result Value Ref Range   B Natriuretic Peptide 369.6 (H) 0.0 - 100.0 pg/mL  I-stat troponin, ED  (not at [facility], [facility])     Status: None   Collection Time: 06/09/15  3:58 PM  Result Value Ref Range   Troponin i, poc 0.01 0.00 - 0.08 ng/mL   Comment 3            Comment: Due to the release kinetics of cTnI, a negative result within the first hours of the onset of symptoms does not rule out myocardial infarction with certainty. If myocardial infarction is still suspected, repeat the test at appropriate intervals.   Troponin I     Status: None   Collection Time: 06/09/15  6:40 PM  Result Value Ref Range   Troponin I <0.03 <0.031 ng/mL    Comment:        NO INDICATION OF MYOCARDIAL INJURY.    Dg Chest 2 View  06/09/2015   CLINICAL DATA:  Shortness of breath, congestive heart failure  EXAM: CHEST  2 VIEW  COMPARISON:  04/17/2015  FINDINGS: Cardiomegaly again noted. No acute infiltrate or pleural effusion. No pulmonary edema. Diffuse thoracic spine osteopenia. There is dextroscoliosis of the lumbar spine. Degenerative changes mid thoracic spine. Again noted old fracture deformity of left proximal humerus.  IMPRESSION: No active disease. Cardiomegaly. Thoracic spine osteopenia.  Dextroscoliosis of the lumbar spine.   Electronically Signed   By: Lahoma Crocker M.D.   On: 06/09/2015 16:10    VITALS: Filed Vitals:   06/09/15 1900  BP: 135/33  Pulse: 55  Temp:   Resp: 19    EXAM: General appearance: alert, appears stated age, no distress and lying fairly flat, not on oxygen Neck: JVD - 6 cm above sternal notch and no carotid bruit Lungs: diminished breath sounds bibasilar Heart: irregularly irregular rhythm Abdomen: soft, non-tender; bowel sounds normal; no masses,  no  organomegaly Extremities: edema 1-2+ bilateral pitting edema, POP over left upper arm Pulses: 1+ pulses Skin: pale, warm, dry Neurologic: Mental status: Alert, oriented, thought content appropriate Psych: Pleasant  IMPRESSION: Principal Problem:   Acute on chronic diastolic CHF (congestive heart failure), NYHA class 3 Active Problems:   Lower extremity edema   Paroxysmal atrial fibrillation   HTN (hypertension)   Gluten intolerance   PLAN: 1. Mrs. Andringa presents with recurrent acute diastolic heart failure, possibly exacerbated by paroxysmal atrial fibrillation. Her EKG shows she is in a atrial fibrillation currently. She is on low-dose amiodarone 200 mg daily. Recently she's had weight gain and responded to high-dose torsemide 80 mg at then quickly gained the weight back the next day. Her daily dose of torsemide was not increased. At this point she is fairly comfortable but not euvolemic. She has distended neck veins and elevated BNP. I believe she'll benefit from additional IV diuresis. I would recommend starting Lasix 40 mg IV twice a day. In addition, she is noted to be in A. fib. It's not clear whether this is persistent or not, however we may want to consider increasing her amiodarone and trying to establish sinus rhythm. This may be the possible etiology of her heart failure exacerbation. The only concern would be bradycardia as has been an issue in the past. I discussed this at length with  her daughter and she is in agreement.  Thank you for contacting us regarding this admission.  Pixie Casino, MD, [facility] Attending Cardiologist Camilla C  06/09/2015, 7:44 PM

## 2015-06-09 NOTE — ED Notes (Signed)
Ria from lab called and notified that new troponin will be drawn.

## 2015-06-09 NOTE — ED Notes (Signed)
Cardiology at bedside.

## 2015-06-09 NOTE — ED Notes (Signed)
No answer x2 

## 2015-06-09 NOTE — ED Notes (Signed)
Pharmacy verified ensure is gluten free, will tube to Pod B for patient

## 2015-06-09 NOTE — Telephone Encounter (Signed)
New message      Daughter calling to tell Dr Eldridge Dace pt has gained 5lbs since yesterday, chest tightness and HR is 57.  Daughter is a physician and thinks pt may need to go to ER but want to talk to someone

## 2015-06-09 NOTE — ED Provider Notes (Signed)
CSN: [id number]     Arrival date & time 06/09/15  1449 History   First MD Initiated Contact with Patient 06/09/15 1748     Chief Complaint  Patient presents with  . Shortness of Breath     (Consider location/radiation/quality/duration/timing/severity/associated sxs/prior Treatment) HPI Comments: Patient with a history of CHF and atrial fibrillation, on Eliquis presents with increased shortness of breath and weight gain. She states that over the weekend she had increased weight gain. She contacted her cardiologist, Dr. Eldridge Dace who recommended increasing her torsemide to 80 mg for 1 day. She lost 6 pounds but regained 5 pounds since yesterday. She reports increased shortness of breath with a dry cough. She reports some tightness in her upper chest and in her neck. She states she normally has these symptoms when she gets fluid overloaded. She has had some increased swelling in her lower extremities. She took an increased dose of torsemide 40 mg today. She denies any fevers or other recent illnesses. She spoke with Dr. Herbie Baltimore today who advised her to come into the emergency department for evaluation and probable admission.   Past Medical History  Diagnosis Date  . Sinus tachycardia   . Scoliosis   . TIA (transient ischemic attack) 2000's X 1  . Trigeminal neuralgia of left side of face 12/13/2014  . Hot flashes 12/13/2014  . Acute on chronic diastolic heart failure   . Atrial fibrillation   . Lower extremity edema   . CHF (congestive heart failure)   . Shortness of breath dyspnea   . Unstable gait 04/24/2015  . Idiopathic scoliosis 04/24/2015  . Weakness 04/24/2015  . Dyspnea 04/24/2015  . Gluten intolerance 04/24/2015  . Degenerative disc disease, lumbar 04/24/2015  . Arthritis     "plenty" (06/09/2015)   Past Surgical History  Procedure Laterality Date  . Tonsillectomy  1944  . Toenail avulsion Left     2nd digit   Family History  Problem Relation Age of Onset  . Hypertension  Father    History  Substance Use Topics  . Smoking status: Never Smoker   . Smokeless tobacco: Never Used  . Alcohol Use: No   OB History    No data available     Review of Systems  Constitutional: Positive for fatigue. Negative for fever, chills and diaphoresis.  HENT: Negative for congestion, rhinorrhea and sneezing.   Eyes: Negative.   Respiratory: Positive for cough, chest tightness and shortness of breath.   Cardiovascular: Negative for chest pain and leg swelling.  Gastrointestinal: Negative for nausea, vomiting, abdominal pain, diarrhea and blood in stool.  Genitourinary: Negative for frequency, hematuria, flank pain and difficulty urinating.  Musculoskeletal: Negative for back pain and arthralgias.  Skin: Negative for rash.  Neurological: Negative for dizziness, speech difficulty, weakness, numbness and headaches.      Allergies  Codeine; Levaquin; Loratadine; and Xylocaine  Home Medications   Prior to Admission medications   Medication Sig Start Date End Date Taking? Authorizing Provider  amiodarone (PACERONE) 200 MG tablet Take 200 mg by mouth daily.   Yes Historical Provider, MD  apixaban (ELIQUIS) 2.5 MG TABS tablet Take 1 tablet (2.5 mg total) by mouth 2 (two) times daily. 01/25/15  Yes Duke Salvia, MD  b complex vitamins tablet Take 0.5 tablets by mouth 3 (three) times daily.    Yes Historical Provider, MD  estradiol (ESTRACE) 1 MG tablet Take 0.5 mg by mouth daily.   Yes Historical Provider, MD  ferrous sulfate 325 (65 FE)  MG EC tablet Take 325 mg by mouth daily.   Yes Historical Provider, MD  fexofenadine (ALLEGRA) 180 MG tablet Take 1 tablet (180 mg total) by mouth daily. 05/17/15  Yes Kimber Relic, MD  MAGNESIUM PO Take 1 tablet by mouth daily.   Yes Historical Provider, MD  Multiple Vitamin (MULTIVITAMIN WITH MINERALS) TABS tablet Take 1 tablet by mouth daily.   Yes Historical Provider, MD  pregabalin (LYRICA) 25 MG capsule Take 50 mg by mouth 2 (two)  times daily.    Yes Historical Provider, MD  progesterone (PROMETRIUM) 100 MG capsule Take 100 mg by mouth every other day.    Yes Historical Provider, MD  torsemide (DEMADEX) 20 MG tablet Pt takes 1 tab daily and may take 1 extra daily if weight gain of 2 lbs or more Patient taking differently: Take 20 mg by mouth. Pt takes 1 tab daily and may take 1 extra daily if weight gain of 2 lbs or more 03/31/15  Yes Corky Crafts, MD  oxyCODONE (OXY IR/ROXICODONE) 5 MG immediate release tablet Take 1 tablet (5 mg total) by mouth every 6 (six) hours as needed for severe pain. Patient not taking: Reported on 06/07/2015 04/17/15   Vassie Loll, MD   BP 160/43 mmHg  Pulse 67  Temp(Src) 98 F (36.7 C) (Oral)  Resp 16  Ht 4' 7.5" (1.41 m)  Wt 102 lb 6.4 oz (46.448 kg)  BMI 23.36 kg/m2  SpO2 98% Physical Exam  Constitutional: She is oriented to person, place, and time. She appears well-developed and well-nourished.  HENT:  Head: Normocephalic and atraumatic.  Eyes: Pupils are equal, round, and reactive to light.  Neck: Normal range of motion. Neck supple.  Cardiovascular: Normal rate and regular rhythm.   Murmur heard. Pulmonary/Chest: Effort normal and breath sounds normal. No respiratory distress. She has no wheezes. She has no rales. She exhibits no tenderness.  Abdominal: Soft. Bowel sounds are normal. There is no tenderness. There is no rebound and no guarding.  Musculoskeletal: Normal range of motion. She exhibits edema.  Lymphadenopathy:    She has no cervical adenopathy.  Neurological: She is alert and oriented to person, place, and time.  Skin: Skin is warm and dry. No rash noted.  Psychiatric: She has a normal mood and affect.    ED Course  Procedures (including critical care time) Labs Review Labs Reviewed  CBC - Abnormal; Notable for the following:    RBC 3.74 (*)    HCT 35.6 (*)    All other components within normal limits  BASIC METABOLIC PANEL - Abnormal; Notable for the  following:    Chloride 95 (*)    Glucose, Bld 119 (*)    BUN 21 (*)    Calcium 8.4 (*)    GFR calc non Af Amer 50 (*)    GFR calc Af Amer 58 (*)    All other components within normal limits  BRAIN NATRIURETIC PEPTIDE - Abnormal; Notable for the following:    B Natriuretic Peptide 369.6 (*)    All other components within normal limits  TROPONIN I  BASIC METABOLIC PANEL  I-STAT TROPOININ, ED    Imaging Review Dg Chest 2 View  06/09/2015   CLINICAL DATA:  Shortness of breath, congestive heart failure  EXAM: CHEST  2 VIEW  COMPARISON:  04/17/2015  FINDINGS: Cardiomegaly again noted. No acute infiltrate or pleural effusion. No pulmonary edema. Diffuse thoracic spine osteopenia. There is dextroscoliosis of the lumbar spine. Degenerative changes  mid thoracic spine. Again noted old fracture deformity of left proximal humerus.  IMPRESSION: No active disease. Cardiomegaly. Thoracic spine osteopenia. Dextroscoliosis of the lumbar spine.   Electronically Signed   By: Natasha Mead M.D.   On: 06/09/2015 16:10     EKG Interpretation   Date/Time:  Wednesday June 09 2015 15:18:52 EDT Ventricular Rate:  53 PR Interval:    QRS Duration: 106 QT Interval:  484 QTC Calculation: 454 R Axis:   -70 Text Interpretation:  Undetermined rhythm Left anterior fascicular block  Nonspecific T wave abnormality Abnormal ECG atrial fib with bradycardia  Confirmed by Kera Deacon  MD, Aryia Delira ([id number]) on 06/09/2015 5:49:38 PM      MDM   Final diagnoses:  Acute systolic congestive heart failure    Patient presents with evidence of congestive heart failure. She was admitted by the cardiology service.    Rolan Bucco, MD 06/10/15 [phone]

## 2015-06-10 ENCOUNTER — Encounter (HOSPITAL_COMMUNITY): Payer: Self-pay | Admitting: Nurse Practitioner

## 2015-06-10 DIAGNOSIS — I509 Heart failure, unspecified: Secondary | ICD-10-CM

## 2015-06-10 DIAGNOSIS — R001 Bradycardia, unspecified: Secondary | ICD-10-CM

## 2015-06-10 LAB — CUP PACEART INCLINIC DEVICE CHECK
Date Time Interrogation Session: 20160613182215
MDC IDC SET ZONE DETECTION INTERVAL: 2000 ms
MDC IDC SET ZONE DETECTION INTERVAL: 430 ms
Zone Setting Detection Interval: 3000 ms

## 2015-06-10 LAB — T4, FREE: FREE T4: 0.84 ng/dL (ref 0.61–1.12)

## 2015-06-10 LAB — BASIC METABOLIC PANEL
ANION GAP: 8 (ref 5–15)
BUN: 21 mg/dL — ABNORMAL HIGH (ref 6–20)
CHLORIDE: 97 mmol/L — AB (ref 101–111)
CO2: 32 mmol/L (ref 22–32)
Calcium: 8 mg/dL — ABNORMAL LOW (ref 8.9–10.3)
Creatinine, Ser: 0.8 mg/dL (ref 0.44–1.00)
GFR calc non Af Amer: >60 mL/min (ref >60)
Glucose, Bld: 86 mg/dL (ref 65–99)
POTASSIUM: 3.8 mmol/L (ref 3.5–5.1)
SODIUM: 137 mmol/L (ref 135–145)

## 2015-06-10 LAB — TSH: TSH: 9.513 u[IU]/mL — ABNORMAL HIGH (ref 0.350–4.500)

## 2015-06-10 MED ORDER — PROGESTERONE MICRONIZED 100 MG PO CAPS
100.0000 mg | ORAL_CAPSULE | ORAL | Status: DC
  Administered 2015-06-10: 100 mg via ORAL
  Filled 2015-06-10 (×2): qty 1

## 2015-06-10 MED ORDER — TORSEMIDE 20 MG PO TABS
20.0000 mg | ORAL_TABLET | Freq: Every day | ORAL | Status: DC
  Administered 2015-06-10 – 2015-06-11 (×2): 20 mg via ORAL
  Filled 2015-06-10 (×2): qty 1

## 2015-06-10 NOTE — Clinical Social Work Note (Signed)
Clinical Social Work Assessment  Patient Details  Name: Melanie Obrien MRN: [id number] Date of Birth: 11/15/1919  Date of referral:  06/10/15               Reason for consult:  Facility Placement (Return to ALF-[facility])                Permission sought to share information with:  Facility Medical sales representative, Family Supports Permission granted to share information::  Yes, Verbal Permission Granted  Name::     Daughter- Dr. [name],  [facility]  Agency::     Relationship::     Contact Information:     Housing/Transportation Living arrangements for the past 2 months:  Assisted Dealer of Information:  Patient, Adult Children Patient Interpreter Needed:  None Criminal Activity/Legal Involvement Pertinent to Current Situation/Hospitalization:  No - Comment as needed Significant Relationships:  Adult Children Lives with:  Facility Resident Do you feel safe going back to the place where you live?  Yes Need for family participation in patient care:  Yes (Comment)  Care giving concerns:  None noted by patient. "I just want to feel better and get my fluid under control".  Daughter is very concerned about medication/treamtment and that patient may benefit from a Pacemaker Placement.     Social Worker assessment / plan:  81 year old female- admitted from [facility] where she currently resides in their Assisted Living Unit. She is a prior resident in Independent Living and plans to return to her apartment once she is stronger.  Patient states that she has been receiving PT and OT at the facility and showing progress. She is anxious to return to her apartment as soon as possible. CSW spoke with Genia Harold- Admissions at [facility]. She stated that they would be able to acccept patient back to ALF as long as she was able to attend to her ADL's with minimal staff involvement. Patient and daughter Melanie Obrien feel that she is back to her baseline and will  be able to return to ALF.  Daughter does not feel patient will benefit or need even short term SNF.  Fl2 completed and placed on list for MD's signature. Patient lying in bed reading a novel. She is alert and oriented- very pleasant. 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  She is proud that she was able to work until her early 57's and has been able to basically maintain a fairly independent lifestyle up until now now.    Employment status:  Retired Database administrator PT Recommendations:  Not assessed at this time Information / Referral to community resources:  Other (Comment Required) (Return to ALF)  Patient/Family's Response to care:  Patient and daughter are aware of current treatment plan at this time with plan to return to ALF as soon as possible.  Daughter is an MD and closely monitor's her mother's care, condition and treatment plan.  Patient/Family's Understanding of and Emotional Response to Diagnosis, Current Treatment, and Prognosis: Patient and daughter are able to verbalize a strong understanding of her current diagnosis and treatment options.  Daughter has spoken to patient's  MDs and feels that she has a better understanding now of the next course of action for patient.  Daughter feels that current treatment plan is appropriate and wants to follow up with MD tomorrow.    Emotional Assessment Appearance:  Appears younger than stated age Attitude/Demeanor/Rapport:   (Pleasant, cooperative, interactive with CSW) Affect (typically observed):  Happy, Quiet, Pleasant, Appropriate Orientation:  Oriented to Self, Oriented to Place, Oriented to  Time, Oriented to Situation Alcohol  / Substance use:  Never Used Psych involvement (Current and /or in the community):  No (Comment)  Discharge Needs  Concerns to be addressed:  Care Coordination Readmission within the last 30 days:  No Current discharge risk:  None Barriers to Discharge:  No Barriers Identified   Darylene Price, LCSW 06/10/2015, 2:00 PM

## 2015-06-10 NOTE — Progress Notes (Signed)
Patient: Melanie Obrien / Admit Date: 06/09/2015 / Date of Encounter: 06/10/2015, 9:49 AM   Subjective: Admitted with fatigue, SOB, reported weight gain. This AM - Denies any pain. Does not complain of any SOB at present time. Still with LEE. Says she feels SOB whenever her weight goes up to 105-107. Plan per PMH was to diurese but this has not yet been started. Weight is 102 today.  Objective: Telemetry: appears to be combination of slow PAF versus WAP, interspersed with sinus bradycardia with HRs in the low 40s at times Physical Exam: Blood pressure 99/44, pulse 55, temperature 98.3 F (36.8 C), temperature source Oral, resp. rate 18, height 4' 7.5" (1.41 m), weight 102 lb 12.8 oz (46.63 kg), SpO2 92 %. General: Well developed thin WF in no acute distress. Sitting up in bed, reading a book. Remarkably astute for her age, recalling her meds and filling me in on details of her medication history. Head: Normocephalic, atraumatic, sclera non-icteric, no xanthomas, nares are without discharge. Neck: JVP not elevated. Lungs: Clear bilaterally to auscultation without wheezes, rales, or rhonchi. Breathing is unlabored. Heart: Somewhat regular rhythm, slow, S1 S2, soft SEM without rubs or gallops.  Abdomen: Soft, non-tender, non-distended with normoactive bowel sounds. No rebound/guarding. Extremities: No clubbing or cyanosis. 1+ bilateral pitting edema. Distal pedal pulses are 2+ and equal bilaterally. Neuro: Alert and oriented X 3. Moves all extremities spontaneously. Psych:  Responds to questions appropriately with a normal affect.   Intake/Output Summary (Last 24 hours) at 06/10/15 0949 Last data filed at 06/10/15 0820  Gross per 24 hour  Intake    320 ml  Output    600 ml  Net   -280 ml    Inpatient Medications:  . amiodarone  200 mg Oral Daily  . apixaban  2.5 mg Oral BID  . B-complex with vitamin C  1 tablet Oral Daily  . estradiol  0.5 mg Oral Daily  . feeding supplement (ENSURE  ENLIVE)  237 mL Oral Once  . ferrous sulfate  325 mg Oral Daily  . multivitamin with minerals  1 tablet Oral Daily  . pregabalin  50 mg Oral BID  . progesterone  100 mg Oral QODAY  . sodium chloride  3 mL Intravenous Q12H   Infusions:    Labs:  Recent Labs  06/09/15 1534 06/10/15 0431  NA 135 137  K 4.0 3.8  CL 95* 97*  CO2 29 32  GLUCOSE 119* 86  BUN 21* 21*  CREATININE 0.94 0.80  CALCIUM 8.4* 8.0*   No results for input(s): AST, ALT, ALKPHOS, BILITOT, PROT, ALBUMIN in the last 72 hours.  Recent Labs  06/09/15 1534  WBC 9.9  HGB 12.1  HCT 35.6*  MCV 95.2  PLT 218   BNP (last 3 results)  Recent Labs  01/18/15 1717 06/09/15 1534  BNP 526.6* 369.6*    ProBNP (last 3 results)  Recent Labs  12/13/14 1405 12/30/14 1619 02/04/15 1446  PROBNP 2339.0* 395.0* 330.0*        Recent Labs  06/09/15 1840  TROPONINI <0.03   Invalid input(s): POCBNP No results for input(s): HGBA1C in the last 72 hours.   Radiology/Studies:  Dg Chest 2 View  06/09/2015   CLINICAL DATA:  Shortness of breath, congestive heart failure  EXAM: CHEST  2 VIEW  COMPARISON:  04/17/2015  FINDINGS: Cardiomegaly again noted. No acute infiltrate or pleural effusion. No pulmonary edema. Diffuse thoracic spine osteopenia. There is dextroscoliosis of the lumbar spine. Degenerative  changes mid thoracic spine. Again noted old fracture deformity of left proximal humerus.  IMPRESSION: No active disease. Cardiomegaly. Thoracic spine osteopenia. Dextroscoliosis of the lumbar spine.   Electronically Signed   By: Natasha Mead M.D.   On: 06/09/2015 16:10     Assessment and Plan   1. Paroxsmal atrial fibrillation with recurrence (SVR), also with history of bradycardia including post-termination pauses by Linq - at this point given her admitting symptoms of lethargy, some confusion (which has resolved), and exacerbation of CHF, I would like to get EP input to determine if they feel that pacemaker is  indicated. Previously she was not felt to be symptomatic but I wonder if her symptoms can be attributed to low HR. Tele reveals tendency for HR to frequently fall to the low 40s at times. Unable to titrate any medicines for AF due to this. I have asked nursing to hold amiodarone this AM since her HR has been low until we get EP input. Keep NPO from this point forward in case they decide pacer is warranted.  2. Acute on chronic diastolic CHF - admission note suggests that IV Lasix was to be started but this was not ordered. BP running a little softer this morning so until we have a plan for her HR, I have held off on writing for this. Will d/w Dr. Tresa Endo whether LE duplex should be ordered given LEE and recent fall in 03/2015, despite stable weight. She is on anticoagulation, making VTE less likely.  3. Essential hypertension with softer BP overnight - follow with above.  4. Abnormal TSH in 04/2015 - recently started on levothyroxine 5/24 with plan for TSH in 8 weeks, but I do not see this on her med list or any documentation of coming off it. The patient doesn't recall ever starting it. Since amiodarone can do strange things to TFTs (sometimes high T4 despite high TSH), will recheck full thyroid panel in house. If she is truly hypothyroid will plan to start med as per original plan.  Signed, Ronie Spies PA-C Pager: [phone]   Patient seen and examined. Agree with assessment and plan. I/O -289 since admission. AF in the 50's. 2 + Pitting LE edema. Cr 0.8.  Will give torsemide 20 mg orally now since did not receive diuretic today.   Lennette Bihari, MD, [facility] 06/10/2015 12:52 PM

## 2015-06-10 NOTE — Progress Notes (Signed)
   06/10/15 1006  Vitals  BP (!) 117/43 mmHg  BP Location Left Arm  BP Method Automatic  Patient Position (if appropriate) Sitting  Pulse Rate (!) 52  Pulse Rate Source Monitor  Called and spoke with [place] PA about patients bp and heart rate.  Advised to hold amiodorone until EP see's patient.

## 2015-06-10 NOTE — Consult Note (Signed)
 ELECTROPHYSIOLOGY CONSULT NOTE    Patient ID: Melanie Obrien MRN: [id number], DOB/AGE: 02/11/1919 81 y.o.  Admit date: 06/09/2015 Date of Consult: 06/10/2015  Primary Physician: BARNES,ELIZABETH STEWART, MD Primary Cardiologist: Klein  Reason for Consultation: Bradycardia  HPI:  Melanie Obrien is a 81 y.o. female with a past medical history significant for paroxysmal atrial fibrillation, chronic diastolic heart failure, prior TIA, and syncope s/p ILR implant.  She presented on the day of admission with fatigue and shortness of breath and has been diuresed with improvement in symptoms.  Telemetry has demonstrated sinus bradycardia with rates into the 40's while awake associated with fatigue.  EP has been asked to evaluate for treatment options.  She currently reports that she is much improved.  She has persistent fatigue.  She denies chest pain or shortness of breath.  She has not had recent fevers, chills, nausea or vomiting.  She has not had dizziness or recurrent syncope.   Past Medical History  Diagnosis Date  . Sinus bradycardia   . TIA (transient ischemic attack) 2000's X 1  . Trigeminal neuralgia of left side of face 12/13/2014  . Acute on chronic diastolic heart failure   . Paroxysmal atrial fibrillation   . CHF (congestive heart failure)   . Unstable gait    . Idiopathic scoliosis    . Gluten intolerance    . Degenerative disc disease, lumbar    . Arthritis           Surgical History:  Past Surgical History  Procedure Laterality Date  . Tonsillectomy  1944  . Toenail avulsion Left     2nd digit     Prescriptions prior to admission  Medication Sig Dispense Refill Last Dose  . amiodarone (PACERONE) 200 MG tablet Take 200 mg by mouth daily.   06/09/2015 at Unknown time  . apixaban (ELIQUIS) 2.5 MG TABS tablet Take 1 tablet (2.5 mg total) by mouth 2 (two) times daily. 60 tablet 0 06/09/2015 at Unknown time  . b complex vitamins tablet Take 0.5 tablets by mouth 3  (three) times daily.    06/09/2015 at Unknown time  . estradiol (ESTRACE) 1 MG tablet Take 0.5 mg by mouth daily.   06/09/2015 at Unknown time  . ferrous sulfate 325 (65 FE) MG EC tablet Take 325 mg by mouth daily.   06/09/2015 at Unknown time  . fexofenadine (ALLEGRA) 180 MG tablet Take 1 tablet (180 mg total) by mouth daily. 30 tablet 3 06/09/2015 at Unknown time  . MAGNESIUM PO Take 1 tablet by mouth daily.   06/09/2015 at Unknown time  . Multiple Vitamin (MULTIVITAMIN WITH MINERALS) TABS tablet Take 1 tablet by mouth daily.   06/09/2015 at Unknown time  . pregabalin (LYRICA) 25 MG capsule Take 50 mg by mouth 2 (two) times daily.    06/09/2015 at Unknown time  . progesterone (PROMETRIUM) 100 MG capsule Take 100 mg by mouth every other day.    06/08/2015 at Unknown time  . torsemide (DEMADEX) 20 MG tablet Pt takes 1 tab daily and may take 1 extra daily if weight gain of 2 lbs or more (Patient taking differently: Take 20 mg by mouth. Pt takes 1 tab daily and may take 1 extra daily if weight gain of 2 lbs or more) 45 tablet 3 06/09/2015 at Unknown time  . oxyCODONE (OXY IR/ROXICODONE) 5 MG immediate release tablet Take 1 tablet (5 mg total) by mouth every 6 (six) hours as needed for severe pain. (Patient   not taking: Reported on 06/07/2015) 30 tablet 0 Not Taking at Unknown time    Inpatient Medications:  . amiodarone  200 mg Oral Daily  . apixaban  2.5 mg Oral BID  . B-complex with vitamin C  1 tablet Oral Daily  . estradiol  0.5 mg Oral Daily  . feeding supplement (ENSURE ENLIVE)  237 mL Oral Once  . ferrous sulfate  325 mg Oral Daily  . multivitamin with minerals  1 tablet Oral Daily  . pregabalin  50 mg Oral BID  . progesterone  100 mg Oral QODAY  . sodium chloride  3 mL Intravenous Q12H  . torsemide  20 mg Oral Daily    Allergies:  Allergies  Allergen Reactions  . Codeine Other (See Comments)    unknown  . Levaquin [Levofloxacin In D5w] Itching  . Loratadine     TACHYCARDIA  . Xylocaine  [Lidocaine Hcl] Other (See Comments)    Uncontrolled shaking    History   Social History  . Marital Status: Widowed    Spouse Name: N/A  . Number of Children: N/A  . Years of Education: N/A   Occupational History  . Not on file.   Social History Main Topics  . Smoking status: Never Smoker   . Smokeless tobacco: Never Used  . Alcohol Use: No  . Drug Use: No  . Sexual Activity: No   Other Topics Concern  . Not on file   Social History Narrative     Family History  Problem Relation Age of Onset  . Hypertension Father      Review of Systems: All other systems reviewed and are otherwise negative except as noted above.  Physical Exam: Filed Vitals:   06/10/15 0128 06/10/15 0640 06/10/15 1006 06/10/15 1656  BP: 106/37 99/44 117/43 108/36  Pulse: 51 55 52 51  Temp: 97.8 F (36.6 C) 98.3 F (36.8 C)  98.6 F (37 C)  TempSrc: Oral Oral  Oral  Resp: 17 18    Height:      Weight:  102 lb 12.8 oz (46.63 kg)    SpO2: 94% 92% 96% 96%    GEN- The patient is elderly appearing, alert and oriented x 3 today.   HEENT: normocephalic, atraumatic; sclera clear, conjunctiva pink; hearing intact; oropharynx clear; neck supple, no JVP Lymph- no cervical lymphadenopathy Lungs- Clear to ausculation bilaterally, normal work of breathing.  No wheezes, rales, rhonchi Heart- Bradycardic regular rate and rhythm GI- soft, non-tender, non-distended, bowel sounds present Extremities- no clubbing, cyanosis, or edema; DP/PT/radial pulses 2+ bilaterally MS- no significant deformity or atrophy Skin- warm and dry, no rash or lesion Psych- euthymic mood, full affect Neuro- strength and sensation are intact  Labs:   Lab Results  Component Value Date   WBC 9.9 06/09/2015   HGB 12.1 06/09/2015   HCT 35.6* 06/09/2015   MCV 95.2 06/09/2015   PLT 218 06/09/2015     Recent Labs Lab 06/10/15 0431  NA 137  K 3.8  CL 97*  CO2 32  BUN 21*  CREATININE 0.80  CALCIUM 8.0*  GLUCOSE 86       Radiology/Studies: Dg Chest 2 View  06/09/2015   CLINICAL DATA:  Shortness of breath, congestive heart failure  EXAM: CHEST  2 VIEW  COMPARISON:  04/17/2015  FINDINGS: Cardiomegaly again noted. No acute infiltrate or pleural effusion. No pulmonary edema. Diffuse thoracic spine osteopenia. There is dextroscoliosis of the lumbar spine. Degenerative changes mid thoracic spine. Again noted old fracture   deformity of left proximal humerus.  IMPRESSION: No active disease. Cardiomegaly. Thoracic spine osteopenia. Dextroscoliosis of the lumbar spine.   Electronically Signed   By: Liviu  Pop M.D.   On: 06/09/2015 16:10    EKG:atrial fibrillation, rate 53, LAFB  TELEMETRY: sinus brady 40-50's  Assessment/Plan: 1.  Symptomatic bradycardia The patient has symptomatic sinus bradycardia as well as slow ventricular response while in atrial fibrillation.  Her Metoprolol has been held since 03/2015 without improvement in heart rates. Pacemaker implantation is indicated.  I discussed briefly with the patient and her daughter. Dr Klein to see in the morning.   2.  Paroxysmal atrial fibrillation Continue Amiodarone Continue Eliquis for CHADS2VASC of at least 5   3.  Chronic diastolic heart failure Improved post diuresis  Dr Klein to see patient in the morning to further discuss pacemaker  Signed, Amber Seiler, NP 06/10/2015 6:27 PM        

## 2015-06-10 NOTE — Progress Notes (Signed)
Loop check in clinic.  Pt with 0 tachy episodes; 0 brady episodes; 0 asystole; 0 symptom episodes; 17.4% AF burden + Amio/Eliquis. Plan to continue [facility] follow up [facility] and f/u w/ [facility] in 03/2016.

## 2015-06-10 NOTE — Progress Notes (Signed)
UR COMPLETED  

## 2015-06-11 DIAGNOSIS — R001 Bradycardia, unspecified: Secondary | ICD-10-CM

## 2015-06-11 LAB — BASIC METABOLIC PANEL
ANION GAP: 8 (ref 5–15)
BUN: 24 mg/dL — ABNORMAL HIGH (ref 6–20)
CO2: 31 mmol/L (ref 22–32)
Calcium: 8.1 mg/dL — ABNORMAL LOW (ref 8.9–10.3)
Chloride: 97 mmol/L — ABNORMAL LOW (ref 101–111)
Creatinine, Ser: 1.02 mg/dL — ABNORMAL HIGH (ref 0.44–1.00)
GFR calc non Af Amer: 46 mL/min — ABNORMAL LOW (ref >60)
GFR, EST AFRICAN AMERICAN: 53 mL/min — AB (ref >60)
Glucose, Bld: 100 mg/dL — ABNORMAL HIGH (ref 65–99)
POTASSIUM: 4.3 mmol/L (ref 3.5–5.1)
SODIUM: 136 mmol/L (ref 135–145)

## 2015-06-11 MED ORDER — TORSEMIDE 20 MG PO TABS: ORAL_TABLET | ORAL | Status: DC

## 2015-06-11 NOTE — Progress Notes (Signed)
OT Cancellation Note  Patient Details Name: Melanie Obrien MRN: [id number] DOB: 03-Aug-1919   Cancelled Treatment:    Reason Eval/Treat Not Completed:  Pt awaiting transportation back to ALF.  Will defer OT to the discretion of ALF.  Evern Bio 06/11/2015, 10:35 AM  [phone]

## 2015-06-11 NOTE — Progress Notes (Signed)
CSW (Clinical Child psychotherapist) spoke with admissions at [facility]. They confirmed pt can return to her ALF apartment. CSW provided pt with dc packet. CSW spoke with pt daughter and notified. Pt daughter to provide transportation and aware of dc packet at bedside. Pt nurse made aware that pt is okay to dc when pt daughter arrives per CSW standpoint. CSW signing off.  [name], LCSWA [phone]

## 2015-06-11 NOTE — Progress Notes (Signed)
CSW (Clinical Child psychotherapist) aware of plan for dc. CSW spoke with pt and notified that CSW has sent dc paperwork to facility and awaiting return phone call confirming pt can be discharged. CSW spoke with pt nurse and updated that CSW awaiting confirmation from facility before pt can dc. Pt nurse to notify MD that FL2 needs to be signed.  Melanie Obrien, LCSWA [phone]

## 2015-06-11 NOTE — Discharge Instructions (Signed)
Be at short stay at 1PM on Thursday, June 23rd Nothing to eat or drink after breakfast 6/23 Ok to take medications with water morning of procedure Wash chest and neck with antibacterial soap night before and morning of procedure Plan to spend one night in the hospital With questions, call [phone]

## 2015-06-11 NOTE — Discharge Summary (Signed)
ELECTROPHYSIOLOGY DISCHARGE SUMMARY    Patient ID: Melanie Obrien,  MRN: [id number], DOB/AGE: 79-Oct-1920 79 y.o.  Admit date: 06/09/2015 Discharge date: 06/11/2015  Primary Care Physician: Gaye Alken, MD Primary Cardiologist: Graciela Husbands  Primary Discharge Diagnosis:  Acute on chronic diastolic heart failure, symptomatic bradycardia  Secondary Discharge Diagnosis:  1.  Paroxysmal atrial fibrillation 2.  Prior TIA 3.  Scoliosis 4.  DDD  Allergies  Allergen Reactions  . Codeine Other (See Comments)    unknown  . Levaquin [Levofloxacin In D5w] Itching  . Loratadine     TACHYCARDIA  . Xylocaine [Lidocaine Hcl] Other (See Comments)    Uncontrolled shaking    Brief HPI/Hospital Course:  Melanie Obrien is a 81 y.o. female with a past medical history significant for paroxysmal atrial fibrillation, chronic diastolic heart failure, prior TIA, and syncope s/p ILR implant. She presented on the day of admission with Melanie Obrien and has been diuresed with improvement in symptoms. Telemetry has demonstrated sinus bradycardia with rates into the 40's while awake associated with Melanie. EP was asked to evaluate for treatment options.  With symptomatic bradycardia limiting up-titration of AAD therapy, pacemaker implantation has been recommended. Risks, benefits reviewed with the patient who wishes to proceed.  The patient would prefer to be discharged and come back next week for procedure.  Will arrange for next Thursday.  Plan to discharge home on increased dose of Torsemide until PPM next week.   Physical Exam: Filed Vitals:   06/10/15 1656 06/10/15 2122 06/11/15 0500 06/11/15 0619  BP: 108/36 143/40  143/58  Pulse: 51 60  59  Temp: 98.6 F (37 C) 97.5 F (36.4 C)  98.3 F (36.8 C)  TempSrc: Oral Axillary  Oral  Resp:  18  17  Height:      Weight:   104 lb 8 oz (47.401 kg)   SpO2: 96% 94%  94%    Labs:   Lab Results  Component Value Date   WBC 9.9 06/09/2015   HGB 12.1 06/09/2015   HCT 35.6* 06/09/2015   MCV 95.2 06/09/2015   PLT 218 06/09/2015     Recent Labs Lab 06/11/15 0315  NA 136  K 4.3  CL 97*  CO2 31  BUN 24*  CREATININE 1.02*  CALCIUM 8.1*  GLUCOSE 100*     Discharge Medications:    Medication List    TAKE these medications        amiodarone 200 MG tablet  Commonly known as:  PACERONE  Take 200 mg by mouth daily.     apixaban 2.5 MG Tabs tablet  Commonly known as:  ELIQUIS  Take 1 tablet (2.5 mg total) by mouth 2 (two) times daily.     b complex vitamins tablet  Take 0.5 tablets by mouth 3 (three) times daily.     estradiol 1 MG tablet  Commonly known as:  ESTRACE  Take 0.5 mg by mouth daily.     ferrous sulfate 325 (65 FE) MG EC tablet  Take 325 mg by mouth daily.     fexofenadine 180 MG tablet  Commonly known as:  ALLEGRA  Take 1 tablet (180 mg total) by mouth daily.     MAGNESIUM PO  Take 1 tablet by mouth daily.     multivitamin with minerals Tabs tablet  Take 1 tablet by mouth daily.     oxyCODONE 5 MG immediate release tablet  Commonly known as:  Oxy IR/ROXICODONE  Take  1 tablet (5 mg total) by mouth every 6 (six) hours as needed for severe pain.     pregabalin 25 MG capsule  Commonly known as:  LYRICA  Take 50 mg by mouth 2 (two) times daily.     progesterone 100 MG capsule  Commonly known as:  PROMETRIUM  Take 100 mg by mouth every other day.     torsemide 20 MG tablet  Commonly known as:  DEMADEX  Take 2 tablets daily        Disposition:  Discharge Instructions    Diet - low sodium heart healthy    Complete by:  As directed      Increase activity slowly    Complete by:  As directed             Duration of Discharge Encounter: Greater than 30 minutes including physician time.  Signed, Gypsy Balsam, NP 06/11/2015 8:00 AM

## 2015-06-11 NOTE — Progress Notes (Signed)
Pt has orders to be discharged. Discharge instructions given and pt has no additional questions at this time. Medication regimen reviewed and pt educated. Pt verbalized understanding and has no additional questions. Telemetry box removed. IV removed and site in good condition. Pt stable and waiting for transportation.   Suheyla Mortellaro RN 

## 2015-06-12 LAB — T3: T3 TOTAL: 69 ng/dL — AB (ref 71–180)

## 2015-06-14 ENCOUNTER — Telehealth: Payer: Self-pay | Admitting: Internal Medicine

## 2015-06-14 ENCOUNTER — Encounter: Payer: Self-pay | Admitting: *Deleted

## 2015-06-14 NOTE — Telephone Encounter (Addendum)
Advised to hold Eliquis one day prior to procedure on Thursday.  Last dose to be taken tomorrow.  Confirmed that LINQ will be taken out prior to PPM insertion. Patient's dtr verbalized understanding and agreeable to plan.

## 2015-06-14 NOTE — Telephone Encounter (Signed)
New message      Pt is due to have pacemaker put in on thurs-----daughter has questions

## 2015-06-14 NOTE — Telephone Encounter (Signed)
New Message    Per Joni Reining is calling she needs Instructions for up coming surgery

## 2015-06-14 NOTE — Telephone Encounter (Signed)
Instructions faxed

## 2015-06-14 NOTE — Telephone Encounter (Signed)
Daughter inquiring as to if mom needs to stop Eliquis prior to procedure Thursday. Also asking if going to take ILR out when placing PPM. Advised I will discuss with Dr. Graciela Husbands and let her know.  She is agreeable to this.

## 2015-06-14 NOTE — Telephone Encounter (Signed)
Melanie Obrien requesting instructions for upcoming procedure this Thursday, including instructions on medications. Requested these instructions/orders be faxed to [phone]

## 2015-06-15 NOTE — Telephone Encounter (Signed)
Procedure time moved up.  Patient to be at hospital at 12:30 pm.  Informed patient's dtr and [facility] Joni Reining).

## 2015-06-16 ENCOUNTER — Ambulatory Visit: Payer: Medicare PPO | Admitting: Interventional Cardiology

## 2015-06-16 ENCOUNTER — Encounter: Payer: Self-pay | Admitting: Cardiology

## 2015-06-17 ENCOUNTER — Encounter (HOSPITAL_COMMUNITY)
Admission: RE | Disposition: A | Discharge status: Home / Self Care | Payer: Medicare PPO | Source: Ambulatory Visit | Attending: Internal Medicine

## 2015-06-17 ENCOUNTER — Ambulatory Visit (HOSPITAL_COMMUNITY)
Admission: RE | Admit: 2015-06-17 | Discharge: 2015-06-18 | Disposition: A | Discharge status: Home / Self Care | Payer: Medicare PPO | Source: Ambulatory Visit | Attending: Internal Medicine | Admitting: Internal Medicine

## 2015-06-17 ENCOUNTER — Non-Acute Institutional Stay: Payer: Medicare PPO | Admitting: Internal Medicine

## 2015-06-17 ENCOUNTER — Encounter (HOSPITAL_COMMUNITY): Payer: Self-pay | Admitting: General Practice

## 2015-06-17 DIAGNOSIS — I480 Paroxysmal atrial fibrillation: Secondary | ICD-10-CM | POA: Diagnosis not present

## 2015-06-17 DIAGNOSIS — R2681 Unsteadiness on feet: Secondary | ICD-10-CM | POA: Diagnosis not present

## 2015-06-17 DIAGNOSIS — Z8673 Personal history of transient ischemic attack (TIA), and cerebral infarction without residual deficits: Secondary | ICD-10-CM | POA: Diagnosis not present

## 2015-06-17 DIAGNOSIS — R600 Localized edema: Secondary | ICD-10-CM

## 2015-06-17 DIAGNOSIS — I10 Essential (primary) hypertension: Secondary | ICD-10-CM | POA: Diagnosis not present

## 2015-06-17 DIAGNOSIS — R5383 Other fatigue: Secondary | ICD-10-CM | POA: Diagnosis not present

## 2015-06-17 DIAGNOSIS — I5033 Acute on chronic diastolic (congestive) heart failure: Secondary | ICD-10-CM | POA: Diagnosis not present

## 2015-06-17 DIAGNOSIS — Z7901 Long term (current) use of anticoagulants: Secondary | ICD-10-CM | POA: Diagnosis not present

## 2015-06-17 DIAGNOSIS — I495 Sick sinus syndrome: Secondary | ICD-10-CM

## 2015-06-17 DIAGNOSIS — R001 Bradycardia, unspecified: Secondary | ICD-10-CM | POA: Diagnosis not present

## 2015-06-17 DIAGNOSIS — Z959 Presence of cardiac and vascular implant and graft, unspecified: Secondary | ICD-10-CM

## 2015-06-17 DIAGNOSIS — I5032 Chronic diastolic (congestive) heart failure: Secondary | ICD-10-CM | POA: Insufficient documentation

## 2015-06-17 DIAGNOSIS — Z4509 Encounter for adjustment and management of other cardiac device: Secondary | ICD-10-CM | POA: Diagnosis not present

## 2015-06-17 DIAGNOSIS — D62 Acute posthemorrhagic anemia: Secondary | ICD-10-CM

## 2015-06-17 DIAGNOSIS — S42292S Other displaced fracture of upper end of left humerus, sequela: Secondary | ICD-10-CM | POA: Diagnosis not present

## 2015-06-17 DIAGNOSIS — E039 Hypothyroidism, unspecified: Secondary | ICD-10-CM

## 2015-06-17 DIAGNOSIS — Z79899 Other long term (current) drug therapy: Secondary | ICD-10-CM | POA: Diagnosis not present

## 2015-06-17 HISTORY — DX: Adverse effect of unspecified anesthetic, initial encounter: T41.45XA

## 2015-06-17 HISTORY — DX: Other complications of anesthesia, initial encounter: T88.59XA

## 2015-06-17 HISTORY — DX: Cardiac murmur, unspecified: R01.1

## 2015-06-17 HISTORY — DX: Presence of automatic (implantable) cardiac defibrillator: Z95.810

## 2015-06-17 HISTORY — DX: Dorsalgia, unspecified: M54.9

## 2015-06-17 HISTORY — DX: Pleural effusion, not elsewhere classified: J90

## 2015-06-17 HISTORY — PX: EP IMPLANTABLE DEVICE: SHX172B

## 2015-06-17 HISTORY — DX: Hypothyroidism, unspecified: E03.9

## 2015-06-17 HISTORY — PX: BI-VENTRICULAR IMPLANTABLE CARDIOVERTER DEFIBRILLATOR  (CRT-D): SHX5747

## 2015-06-17 HISTORY — DX: Other chronic pain: G89.29

## 2015-06-17 LAB — SURGICAL PCR SCREEN
MRSA, PCR: NEGATIVE
Staphylococcus aureus: NEGATIVE

## 2015-06-17 SURGERY — PACEMAKER IMPLANT
Anesthesia: LOCAL

## 2015-06-17 MED ORDER — CHLOROPROCAINE HCL 3 % IJ SOLN
20.0000 mL | INTRAMUSCULAR | Status: DC
  Filled 2015-06-17: qty 20

## 2015-06-17 MED ORDER — SODIUM CHLORIDE 0.9 % IV SOLN
INTRAVENOUS | Status: AC
  Administered 2015-06-17: 50 mL/h via INTRAVENOUS

## 2015-06-17 MED ORDER — ONDANSETRON HCL 4 MG/2ML IJ SOLN: 4.0000 mg | Freq: Four times a day (QID) | INTRAMUSCULAR | Status: DC | PRN

## 2015-06-17 MED ORDER — PREGABALIN 25 MG PO CAPS
50.0000 mg | ORAL_CAPSULE | Freq: Two times a day (BID) | ORAL | Status: DC
  Administered 2015-06-17: 50 mg via ORAL
  Filled 2015-06-17: qty 2

## 2015-06-17 MED ORDER — MIDAZOLAM HCL 5 MG/5ML IJ SOLN
INTRAMUSCULAR | Status: AC
  Filled 2015-06-17: qty 5

## 2015-06-17 MED ORDER — LEVOTHYROXINE SODIUM 50 MCG PO TABS
50.0000 ug | ORAL_TABLET | Freq: Every day | ORAL | Status: DC
  Administered 2015-06-18: 50 ug via ORAL
  Filled 2015-06-17 (×2): qty 1

## 2015-06-17 MED ORDER — SODIUM CHLORIDE 0.9 % IV SOLN
INTRAVENOUS | Status: DC
  Administered 2015-06-17: 14:00:00 via INTRAVENOUS

## 2015-06-17 MED ORDER — MUPIROCIN 2 % EX OINT
TOPICAL_OINTMENT | Freq: Two times a day (BID) | CUTANEOUS | Status: DC
  Administered 2015-06-17: 22:00:00 via NASAL
  Administered 2015-06-17: 1 via NASAL
  Filled 2015-06-17 (×3): qty 22

## 2015-06-17 MED ORDER — ESTRADIOL 1 MG PO TABS
0.5000 mg | ORAL_TABLET | Freq: Every day | ORAL | Status: DC
  Administered 2015-06-17 – 2015-06-18 (×2): 0.5 mg via ORAL
  Filled 2015-06-17 (×3): qty 0.5

## 2015-06-17 MED ORDER — SODIUM CHLORIDE 0.9 % IR SOLN
Status: AC
  Filled 2015-06-17: qty 2

## 2015-06-17 MED ORDER — HEPARIN (PORCINE) IN NACL 2-0.9 UNIT/ML-% IJ SOLN
INTRAMUSCULAR | Status: AC
  Filled 2015-06-17: qty 500

## 2015-06-17 MED ORDER — CHLORHEXIDINE GLUCONATE 4 % EX LIQD
60.0000 mL | Freq: Once | CUTANEOUS | Status: DC
  Filled 2015-06-17: qty 60

## 2015-06-17 MED ORDER — VANCOMYCIN HCL IN DEXTROSE 1-5 GM/200ML-% IV SOLN
1000.0000 mg | INTRAVENOUS | Status: AC
  Administered 2015-06-17: 1000 mg via INTRAVENOUS
  Filled 2015-06-17: qty 200

## 2015-06-17 MED ORDER — ADULT MULTIVITAMIN W/MINERALS CH
1.0000 | ORAL_TABLET | Freq: Every day | ORAL | Status: DC
  Administered 2015-06-17 – 2015-06-18 (×2): 1 via ORAL
  Filled 2015-06-17 (×3): qty 1

## 2015-06-17 MED ORDER — ACETAMINOPHEN 325 MG PO TABS: 325.0000 mg | ORAL_TABLET | ORAL | Status: DC | PRN

## 2015-06-17 MED ORDER — APIXABAN 2.5 MG PO TABS
2.5000 mg | ORAL_TABLET | Freq: Two times a day (BID) | ORAL | Status: DC
  Administered 2015-06-17 – 2015-06-18 (×2): 2.5 mg via ORAL
  Filled 2015-06-17 (×5): qty 1

## 2015-06-17 MED ORDER — AMIODARONE HCL 200 MG PO TABS
200.0000 mg | ORAL_TABLET | Freq: Every day | ORAL | Status: DC
  Administered 2015-06-17 – 2015-06-18 (×2): 200 mg via ORAL
  Filled 2015-06-17 (×3): qty 1

## 2015-06-17 MED ORDER — TORSEMIDE 20 MG PO TABS
20.0000 mg | ORAL_TABLET | Freq: Once | ORAL | Status: AC
  Administered 2015-06-17: 20 mg via ORAL
  Filled 2015-06-17: qty 1

## 2015-06-17 MED ORDER — FENTANYL CITRATE (PF) 100 MCG/2ML IJ SOLN
INTRAMUSCULAR | Status: AC
  Filled 2015-06-17: qty 2

## 2015-06-17 MED ORDER — BUPIVACAINE HCL (PF) 0.25 % IJ SOLN
INTRAMUSCULAR | Status: AC
  Filled 2015-06-17: qty 30

## 2015-06-17 MED ORDER — LIDOCAINE HCL (PF) 1 % IJ SOLN
INTRAMUSCULAR | Status: AC
  Filled 2015-06-17: qty 60

## 2015-06-17 MED ORDER — VANCOMYCIN HCL IN DEXTROSE 1-5 GM/200ML-% IV SOLN
1000.0000 mg | Freq: Two times a day (BID) | INTRAVENOUS | Status: AC
Start: 2015-06-18 — End: 2015-06-18
  Administered 2015-06-18: 1000 mg via INTRAVENOUS
  Filled 2015-06-17: qty 200

## 2015-06-17 MED ORDER — SODIUM CHLORIDE 0.9 % IR SOLN
80.0000 mg | Status: AC
  Administered 2015-06-17: 80 mg

## 2015-06-17 MED ORDER — PROGESTERONE MICRONIZED 100 MG PO CAPS
100.0000 mg | ORAL_CAPSULE | ORAL | Status: DC
  Administered 2015-06-18: 100 mg via ORAL
  Filled 2015-06-17 (×2): qty 1

## 2015-06-17 MED ORDER — MUPIROCIN 2 % EX OINT
TOPICAL_OINTMENT | CUTANEOUS | Status: AC
  Filled 2015-06-17: qty 22

## 2015-06-17 MED ORDER — BUPIVACAINE HCL (PF) 0.25 % IJ SOLN
INTRAMUSCULAR | Status: DC | PRN
  Administered 2015-06-17: 30 mL

## 2015-06-17 SURGICAL SUPPLY — 8 items
CABLE SURGICAL S-101-97-12 (CABLE) ×3 IMPLANT
LEAD ISOFLEX OPT 1944-46CM (Lead) ×3 IMPLANT
LEAD ISOFLEX OPT 1948-58CM (Lead) ×3 IMPLANT
PAD DEFIB LIFELINK (PAD) ×3 IMPLANT
PPM ASSURITY DR PM2240 (Pacemaker) ×3 IMPLANT
SHEATH CLASSIC 7F (SHEATH) ×6 IMPLANT
SYR 8ML ANGIOGRAPH HI-PRES (SYRINGE) ×3 IMPLANT
TRAY PACEMAKER INSERTION (CUSTOM PROCEDURE TRAY) ×3 IMPLANT

## 2015-06-17 NOTE — H&P (View-Only) (Signed)
ELECTROPHYSIOLOGY CONSULT NOTE    Patient ID: Melanie Obrien MRN: [id number], DOB/AGE: 07-11-1919 81 y.o.  Admit date: 06/09/2015 Date of Consult: 06/10/2015  Primary Physician: Gaye Alken, MD Primary Cardiologist: Graciela Husbands  Reason for Consultation: Bradycardia  HPI:  Melanie Obrien is a 81 y.o. female with a past medical history significant for paroxysmal atrial fibrillation, chronic diastolic heart failure, prior TIA, and syncope s/p ILR implant.  She presented on the day of admission with fatigue and shortness of breath and has been diuresed with improvement in symptoms.  Telemetry has demonstrated sinus bradycardia with rates into the 40's while awake associated with fatigue.  EP has been asked to evaluate for treatment options.  She currently reports that she is much improved.  She has persistent fatigue.  She denies chest pain or shortness of breath.  She has not had recent fevers, chills, nausea or vomiting.  She has not had dizziness or recurrent syncope.   Past Medical History  Diagnosis Date  . Sinus bradycardia   . TIA (transient ischemic attack) 2000's X 1  . Trigeminal neuralgia of left side of face 12/13/2014  . Acute on chronic diastolic heart failure   . Paroxysmal atrial fibrillation   . CHF (congestive heart failure)   . Unstable gait    . Idiopathic scoliosis    . Gluten intolerance    . Degenerative disc disease, lumbar    . Arthritis           Surgical History:  Past Surgical History  Procedure Laterality Date  . Tonsillectomy  1944  . Toenail avulsion Left     2nd digit     Prescriptions prior to admission  Medication Sig Dispense Refill Last Dose  . amiodarone (PACERONE) 200 MG tablet Take 200 mg by mouth daily.   06/09/2015 at Unknown time  . apixaban (ELIQUIS) 2.5 MG TABS tablet Take 1 tablet (2.5 mg total) by mouth 2 (two) times daily. 60 tablet 0 06/09/2015 at Unknown time  . b complex vitamins tablet Take 0.5 tablets by mouth 3  (three) times daily.    06/09/2015 at Unknown time  . estradiol (ESTRACE) 1 MG tablet Take 0.5 mg by mouth daily.   06/09/2015 at Unknown time  . ferrous sulfate 325 (65 FE) MG EC tablet Take 325 mg by mouth daily.   06/09/2015 at Unknown time  . fexofenadine (ALLEGRA) 180 MG tablet Take 1 tablet (180 mg total) by mouth daily. 30 tablet 3 06/09/2015 at Unknown time  . MAGNESIUM PO Take 1 tablet by mouth daily.   06/09/2015 at Unknown time  . Multiple Vitamin (MULTIVITAMIN WITH MINERALS) TABS tablet Take 1 tablet by mouth daily.   06/09/2015 at Unknown time  . pregabalin (LYRICA) 25 MG capsule Take 50 mg by mouth 2 (two) times daily.    06/09/2015 at Unknown time  . progesterone (PROMETRIUM) 100 MG capsule Take 100 mg by mouth every other day.    06/08/2015 at Unknown time  . torsemide (DEMADEX) 20 MG tablet Pt takes 1 tab daily and may take 1 extra daily if weight gain of 2 lbs or more (Patient taking differently: Take 20 mg by mouth. Pt takes 1 tab daily and may take 1 extra daily if weight gain of 2 lbs or more) 45 tablet 3 06/09/2015 at Unknown time  . oxyCODONE (OXY IR/ROXICODONE) 5 MG immediate release tablet Take 1 tablet (5 mg total) by mouth every 6 (six) hours as needed for severe pain. (Patient  not taking: Reported on 06/07/2015) 30 tablet 0 Not Taking at Unknown time    Inpatient Medications:  . amiodarone  200 mg Oral Daily  . apixaban  2.5 mg Oral BID  . B-complex with vitamin C  1 tablet Oral Daily  . estradiol  0.5 mg Oral Daily  . feeding supplement (ENSURE ENLIVE)  237 mL Oral Once  . ferrous sulfate  325 mg Oral Daily  . multivitamin with minerals  1 tablet Oral Daily  . pregabalin  50 mg Oral BID  . progesterone  100 mg Oral QODAY  . sodium chloride  3 mL Intravenous Q12H  . torsemide  20 mg Oral Daily    Allergies:  Allergies  Allergen Reactions  . Codeine Other (See Comments)    unknown  . Levaquin [Levofloxacin In D5w] Itching  . Loratadine     TACHYCARDIA  . Xylocaine  [Lidocaine Hcl] Other (See Comments)    Uncontrolled shaking    History   Social History  . Marital Status: Widowed    Spouse Name: N/A  . Number of Children: N/A  . Years of Education: N/A   Occupational History  . Not on file.   Social History Main Topics  . Smoking status: Never Smoker   . Smokeless tobacco: Never Used  . Alcohol Use: No  . Drug Use: No  . Sexual Activity: No   Other Topics Concern  . Not on file   Social History Narrative     Family History  Problem Relation Age of Onset  . Hypertension Father      Review of Systems: All other systems reviewed and are otherwise negative except as noted above.  Physical Exam: Filed Vitals:   06/10/15 0128 06/10/15 0640 06/10/15 1006 06/10/15 1656  BP: 106/37 99/44 117/43 108/36  Pulse: 51 55 52 51  Temp: 97.8 F (36.6 C) 98.3 F (36.8 C)  98.6 F (37 C)  TempSrc: Oral Oral  Oral  Resp: 17 18    Height:      Weight:  102 lb 12.8 oz (46.63 kg)    SpO2: 94% 92% 96% 96%    GEN- The patient is elderly appearing, alert and oriented x 3 today.   HEENT: normocephalic, atraumatic; sclera clear, conjunctiva pink; hearing intact; oropharynx clear; neck supple, no JVP Lymph- no cervical lymphadenopathy Lungs- Clear to ausculation bilaterally, normal work of breathing.  No wheezes, rales, rhonchi Heart- Bradycardic regular rate and rhythm GI- soft, non-tender, non-distended, bowel sounds present Extremities- no clubbing, cyanosis, or edema; DP/PT/radial pulses 2+ bilaterally MS- no significant deformity or atrophy Skin- warm and dry, no rash or lesion Psych- euthymic mood, full affect Neuro- strength and sensation are intact  Labs:   Lab Results  Component Value Date   WBC 9.9 06/09/2015   HGB 12.1 06/09/2015   HCT 35.6* 06/09/2015   MCV 95.2 06/09/2015   PLT 218 06/09/2015     Recent Labs Lab 06/10/15 0431  NA 137  K 3.8  CL 97*  CO2 32  BUN 21*  CREATININE 0.80  CALCIUM 8.0*  GLUCOSE 86       Radiology/Studies: Dg Chest 2 View  06/09/2015   CLINICAL DATA:  Shortness of breath, congestive heart failure  EXAM: CHEST  2 VIEW  COMPARISON:  04/17/2015  FINDINGS: Cardiomegaly again noted. No acute infiltrate or pleural effusion. No pulmonary edema. Diffuse thoracic spine osteopenia. There is dextroscoliosis of the lumbar spine. Degenerative changes mid thoracic spine. Again noted old fracture  deformity of left proximal humerus.  IMPRESSION: No active disease. Cardiomegaly. Thoracic spine osteopenia. Dextroscoliosis of the lumbar spine.   Electronically Signed   By: Natasha Mead M.D.   On: 06/09/2015 16:10    BMW:UXLKGM fibrillation, rate 53, LAFB  TELEMETRY: sinus brady 40-50's  Assessment/Plan: 1.  Symptomatic bradycardia The patient has symptomatic sinus bradycardia as well as slow ventricular response while in atrial fibrillation.  Her Metoprolol has been held since 03/2015 without improvement in heart rates. Pacemaker implantation is indicated.  I discussed briefly with the patient and her daughter. Dr Graciela Husbands to see in the morning.   2.  Paroxysmal atrial fibrillation Continue Amiodarone Continue Eliquis for CHADS2VASC of at least 5   3.  Chronic diastolic heart failure Improved post diuresis  Dr Graciela Husbands to see patient in the morning to further discuss pacemaker  Signed, Gypsy Balsam, NP 06/10/2015 6:27 PM

## 2015-06-17 NOTE — CV Procedure (Signed)
Loop recorder explantation with local anesthesia steri strip dressing

## 2015-06-17 NOTE — Progress Notes (Signed)
Orthopedic Tech Progress Note Patient Details:  Melanie Obrien 07-14-1919 [id number] Patient already has arm sling on. Patient ID: APRYLE ROETHLER, female   DOB: [birth year], 81 y.o.   MRN: [id number]   Jennye Moccasin 06/17/2015, 5:10 PM

## 2015-06-17 NOTE — Progress Notes (Signed)
Patient ID: FUSAKO TANABE, female   DOB: 16-Jan-1919, 81 y.o.   MRN: [id number]    HISTORY AND PHYSICAL  Location:  [place] Room Number: 15 Place of Service: ALF [phone])   Extended Emergency Contact Information Primary Emergency Contact: McNeill,Dr. Ulysees Barns [place] Phone: [phone] Work Phone: [phone] Mobile Phone: [phone] Relation: Daughter  Advanced Directive information    Chief Complaint  Patient presents with  . Readmit To SNF    HPI:  Ms Mccarron was readmitted to [facility] on 06/11/15 following an admission at [facility] from 6/15-6/17 for progressive shortness of breath and weight gain with and elevated BNP of 369.6, presumed to be an exacerbation of her diastolic heart failure. Prior to hospitalization Ms Lofaso was residing at [facility] where she was recovering following a fall with subsequent right proximal humerus fracture. Past medical history is significant for paroxysmal atrial fibrillation, diastolic heart failure (LVEF 55-60%), hypothyroidism, sinus bradycardia, and unsteady gait. During admission she responded well to IV diuresis and was d/c'd on 20 mg Torsemide BID, shortness of breath improved as well as LE edema, back to baseline, 1+b bilaterally.    While admitted telemetry demonstrated bradycardia in addition to atrial fibrillation, given her history of fatigue and shortness of breath associated with bradycardia, EP was consulted and have decided to move forward with pacemaker insertion. Today on exam Ms Falotico is eager to get to her appointment for pacemaker insertion which is later this afternoon. Following this procedure she would like progress with strengthening and gait improvement such that [place]. She adds that yesterday she walked, with her walker, [place] and [place] errands and doing laundry 4 times. On exam she is able to stand easily and steadily and  independently walk with 4 wheel walker.   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 Had recent increase in levothyroxine and TSH dropped from !7 to 9. TSH to be repeated in about 4 weeks.  Past Medical History  Diagnosis Date  . Sinus bradycardia   . TIA (transient ischemic attack) 2000's X 1  . Trigeminal neuralgia of left side of face 12/13/2014  . Acute on chronic diastolic heart failure   . Paroxysmal atrial fibrillation   . CHF (congestive heart failure)   . Unstable gait    . Idiopathic scoliosis    . Gluten intolerance    . Degenerative disc disease, lumbar    . Arthritis          Past Surgical History  Procedure Laterality Date  . Tonsillectomy  1944  . Toenail avulsion Left     2nd digit    Patient Care Team: Leighton Ruff, MD as PCP - General (Family Medicine) Deboraha Sprang, MD as Consulting Physician (Cardiology) Jettie Booze, MD as Consulting Physician (Interventional Cardiology) Renette Butters, MD as Attending Physician (Orthopedic Surgery)  History   Social History  . Marital Status: Widowed    Spouse Name: N/A  . Number of Children: N/A  . Years of Education: N/A   Occupational History  . Not on file.   Social History Main Topics  . Smoking status: Never Smoker   . Smokeless tobacco: Never Used  . Alcohol Use: No  . Drug Use: No  . Sexual Activity: No   Other Topics Concern  . Not on file   Social History Narrative     reports that she has  never smoked. She has never used smokeless tobacco. She reports that she does not drink alcohol or use illicit drugs.  Family History  Problem Relation Age of Onset  . Hypertension Father    Family Status  Relation Status Death Age  . Mother Deceased   . Father Deceased   . Daughter Alive      There is no immunization history on file for this patient.  Allergies  Allergen Reactions  . Codeine Other (See Comments)    unknown  . Levaquin [Levofloxacin In D5w] Itching  .  Loratadine     TACHYCARDIA  . Xylocaine [Lidocaine Hcl] Other (See Comments)    Uncontrolled shaking    Medications: Patient's Medications  New Prescriptions   No medications on file  Previous Medications   AMIODARONE (PACERONE) 200 MG TABLET    Take 200 mg by mouth daily.   APIXABAN (ELIQUIS) 2.5 MG TABS TABLET    Take 1 tablet (2.5 mg total) by mouth 2 (two) times daily.   B COMPLEX VITAMINS TABLET    Take 0.5 tablets by mouth 3 (three) times daily.    CHOLECALCIFEROL (VITAMIN D3) 5000 UNITS TABS    Take 1 tablet by mouth once.   ESTRADIOL (ESTRACE) 1 MG TABLET    Take 0.5 mg by mouth daily.   FERROUS SULFATE 325 (65 FE) MG EC TABLET    Take 325 mg by mouth daily.   FEXOFENADINE (ALLEGRA) 180 MG TABLET    Take 1 tablet (180 mg total) by mouth daily.   LEVOTHYROXINE (SYNTHROID, LEVOTHROID) 50 MCG TABLET    Take 50 mcg by mouth daily before breakfast.   MAGNESIUM PO    Take 1 tablet by mouth daily.   MULTIPLE VITAMIN (MULTIVITAMIN WITH MINERALS) TABS TABLET    Take 1 tablet by mouth daily.   PREGABALIN (LYRICA) 25 MG CAPSULE    Take 50 mg by mouth 2 (two) times daily.    PROGESTERONE (PROMETRIUM) 100 MG CAPSULE    Take 100 mg by mouth every other day.    TORSEMIDE (DEMADEX) 20 MG TABLET    Take 2 tablets daily  Modified Medications   No medications on file  Discontinued Medications   OXYCODONE (OXY IR/ROXICODONE) 5 MG IMMEDIATE RELEASE TABLET    Take 1 tablet (5 mg total) by mouth every 6 (six) hours as needed for severe pain.    Review of Systems  Constitutional: Positive for fatigue. Negative for fever, chills, diaphoresis, activity change, appetite change and unexpected weight change.  HENT: Positive for hearing loss. Negative for dental problem, drooling, ear discharge, ear pain, facial swelling, mouth sores, nosebleeds, postnasal drip, rhinorrhea, sinus pressure, sneezing, sore throat, tinnitus, trouble swallowing and voice change.        Rhinitis.   Eyes: Negative for  photophobia, pain, discharge, redness, itching and visual disturbance.  Respiratory: Positive for shortness of breath. Negative for apnea, cough, choking, chest tightness, wheezing and stridor.   Cardiovascular: Positive for leg swelling.  Gastrointestinal: Negative for nausea, vomiting, abdominal pain, diarrhea, constipation, blood in stool, abdominal distention, anal bleeding and rectal pain.       Patient states she has been gluten intolerant for the last couple of years.  Endocrine: Negative for cold intolerance, heat intolerance, polydipsia, polyphagia and polyuria.  Genitourinary: Positive for frequency. Negative for dysuria, urgency, hematuria, flank pain, decreased urine volume, difficulty urinating, genital sores and pelvic pain.  Musculoskeletal: Positive for arthralgias and gait problem. Negative for myalgias, back pain, joint swelling,  neck pain and neck stiffness.       Left humerus head fx. Left arm swelling and massive ecchymoses   Skin: Positive for color change. Negative for pallor, rash and wound.       Massive ecchymoses left arm   Allergic/Immunologic: Positive for environmental allergies. Negative for food allergies and immunocompromised state.  Neurological: Negative for dizziness, tremors, seizures, syncope, facial asymmetry, speech difficulty, weakness, light-headedness, numbness and headaches.  Hematological: Negative for adenopathy. Does not bruise/bleed easily.  Psychiatric/Behavioral: Negative for hallucinations, behavioral problems, confusion, sleep disturbance, dysphoric mood, decreased concentration and agitation. The patient is not nervous/anxious and is not hyperactive.     Filed Vitals:   06/14/15 1143  BP: 137/62  Pulse: 50  Temp: 97.4 F (36.3 C)  Resp: 18  Weight: 104 lb 8 oz (47.401 kg)  SpO2: 94%   Body mass index is 23.84 kg/(m^2).  Physical Exam  Constitutional: She is oriented to person, place, and time. She appears well-developed and  well-nourished. No distress.  HENT:  Head: Normocephalic and atraumatic.  Right Ear: External ear normal.  Left Ear: External ear normal.  Nose: Nose normal.  Mouth/Throat: Oropharynx is clear and moist. No oropharyngeal exudate.  Eyes: Conjunctivae and EOM are normal. Pupils are equal, round, and reactive to light. Right eye exhibits no discharge. Left eye exhibits no discharge. No scleral icterus.  Neck: Normal range of motion. Neck supple. No JVD present. No tracheal deviation present. No thyromegaly present.  Cardiovascular: Intact distal pulses.  An irregular rhythm present. Bradycardia present.  Exam reveals no gallop and no friction rub.   Murmur heard.  Systolic (R & L sternal border) murmur is present with a grade of 3/6  Irregular heart beats.   Pulmonary/Chest: Effort normal and breath sounds normal. No stridor. No respiratory distress. She has no wheezes. She has no rales. She exhibits no tenderness.  Abdominal: Soft. Bowel sounds are normal. She exhibits no distension and no mass. There is no tenderness. There is no rebound and no guarding.  Musculoskeletal: She exhibits edema and tenderness.  04/16/15 Left humerus fx, mild left arm swelling. BLE 1+ chronic edema.   Neurological: She is alert and oriented to person, place, and time. She has normal reflexes. She displays normal reflexes. No cranial nerve deficit. She exhibits normal muscle tone. Coordination normal.  Skin: No rash noted. She is not diaphoretic. No erythema. No pallor.  Massive ecchymoses left arm  Psychiatric: She has a normal mood and affect. Her behavior is normal. Judgment and thought content normal.     Labs reviewed: Admission on 06/09/2015, Discharged on 06/11/2015  Component Date Value Ref Range Status  . WBC 06/09/2015 9.9  4.0 - 10.5 K/uL Final  . RBC 06/09/2015 3.74* 3.87 - 5.11 MIL/uL Final  . Hemoglobin 06/09/2015 12.1  12.0 - 15.0 g/dL Final  . HCT 06/09/2015 35.6* 36.0 - 46.0 % Final  . MCV  06/09/2015 95.2  78.0 - 100.0 fL Final  . MCH 06/09/2015 32.4  26.0 - 34.0 pg Final  . MCHC 06/09/2015 34.0  30.0 - 36.0 g/dL Final  . RDW 06/09/2015 14.2  11.5 - 15.5 % Final  . Platelets 06/09/2015 218  150 - 400 K/uL Final  . Sodium 06/09/2015 135  135 - 145 mmol/L Final  . Potassium 06/09/2015 4.0  3.5 - 5.1 mmol/L Final  . Chloride 06/09/2015 95* 101 - 111 mmol/L Final  . CO2 06/09/2015 29  22 - 32 mmol/L Final  . Glucose, Bld  06/09/2015 119* 65 - 99 mg/dL Final  . BUN 06/09/2015 21* 6 - 20 mg/dL Final  . Creatinine, Ser 06/09/2015 0.94  0.44 - 1.00 mg/dL Final  . Calcium 06/09/2015 8.4* 8.9 - 10.3 mg/dL Final  . GFR calc non Af Amer 06/09/2015 50* >60 mL/min Final  . GFR calc Af Amer 06/09/2015 58* >60 mL/min Final   Comment: (NOTE) The eGFR has been calculated using the CKD EPI equation. This calculation has not been validated in all clinical situations. eGFR's persistently <60 mL/min signify possible Chronic Kidney Disease.   . Anion gap 06/09/2015 11  5 - 15 Final  . B Natriuretic Peptide 06/09/2015 369.6* 0.0 - 100.0 pg/mL Final  . Troponin i, poc 06/09/2015 0.01  0.00 - 0.08 ng/mL Final  . Comment 3 06/09/2015          Final   Comment: Due to the release kinetics of cTnI, a negative result within the first hours of the onset of symptoms does not rule out myocardial infarction with certainty. If myocardial infarction is still suspected, repeat the test at appropriate intervals.   . Troponin I 06/09/2015 <0.03  <0.031 ng/mL Final   Comment:        NO INDICATION OF MYOCARDIAL INJURY.   . Sodium 06/10/2015 137  135 - 145 mmol/L Final  . Potassium 06/10/2015 3.8  3.5 - 5.1 mmol/L Final  . Chloride 06/10/2015 97* 101 - 111 mmol/L Final  . CO2 06/10/2015 32  22 - 32 mmol/L Final  . Glucose, Bld 06/10/2015 86  65 - 99 mg/dL Final  . BUN 06/10/2015 21* 6 - 20 mg/dL Final  . Creatinine, Ser 06/10/2015 0.80  0.44 - 1.00 mg/dL Final  . Calcium 06/10/2015 8.0* 8.9 - 10.3  mg/dL Final  . GFR calc non Af Amer 06/10/2015 >60  >60 mL/min Final  . GFR calc Af Amer 06/10/2015 >60  >60 mL/min Final   Comment: (NOTE) The eGFR has been calculated using the CKD EPI equation. This calculation has not been validated in all clinical situations. eGFR's persistently <60 mL/min signify possible Chronic Kidney Disease.   . Anion gap 06/10/2015 8  5 - 15 Final  . T3, Total 06/10/2015 69* 71 - 180 ng/dL Final   Comment: (NOTE) Performed At: [facility] [place], [place] [id number] Lindon Romp MD WH:[id number]   . Free T4 06/10/2015 0.84  0.61 - 1.12 ng/dL Final  . TSH 06/10/2015 9.513* 0.350 - 4.500 uIU/mL Final  . Sodium 06/11/2015 136  135 - 145 mmol/L Final  . Potassium 06/11/2015 4.3  3.5 - 5.1 mmol/L Final  . Chloride 06/11/2015 97* 101 - 111 mmol/L Final  . CO2 06/11/2015 31  22 - 32 mmol/L Final  . Glucose, Bld 06/11/2015 100* 65 - 99 mg/dL Final  . BUN 06/11/2015 24* 6 - 20 mg/dL Final  . Creatinine, Ser 06/11/2015 1.02* 0.44 - 1.00 mg/dL Final  . Calcium 06/11/2015 8.1* 8.9 - 10.3 mg/dL Final  . GFR calc non Af Amer 06/11/2015 46* >60 mL/min Final  . GFR calc Af Amer 06/11/2015 53* >60 mL/min Final   Comment: (NOTE) The eGFR has been calculated using the CKD EPI equation. This calculation has not been validated in all clinical situations. eGFR's persistently <60 mL/min signify possible Chronic Kidney Disease.   . Anion gap 06/11/2015 8  5 - 15 Final  Clinical Support on 06/07/2015  Component Date Value Ref Range Status  . Date Time Interrogation Session 06/07/2015  [id number]   Preliminary  . Pulse Generator Manufacturer 06/07/2015 MERM   Preliminary  . Pulse Gen Model 06/07/2015 [id number] Reveal LINQ   Preliminary  . Pulse Gen Serial Number 06/07/2015 [id number]   Preliminary  . Implantable Pulse Generator Type 06/07/2015 ICM/ILR   Preliminary  . Implantable Pulse Generator Implan* 06/07/2015 [id number]    Preliminary  . Zone Setting Type Category 06/07/2015 VF   Preliminary  . Zone Setting Type Category 06/07/2015 VT   Preliminary  . Zone Setting Type Category 06/07/2015 VENTRICULAR_TACHYCARDIA_1   Preliminary  . Zone Setting Detection Interval 06/07/2015 430   Preliminary  . Zone Setting Type Category 06/07/2015 VENTRICULAR_TACHYCARDIA_2   Preliminary  . Zone Setting Type Category 06/07/2015 ATRIAL_FIBRILLATION   Preliminary  . Zone Setting Type Category 06/07/2015 ATAF   Preliminary  . Zone Setting Type Category 06/07/2015 ASYSTOLE   Preliminary  . Zone Setting Detection Interval 06/07/2015 3000   Preliminary  . Zone Setting Type Category 06/07/2015 BRADYCARDIA   Preliminary  . Zone Setting Detection Interval 06/07/2015 2000   Preliminary  . Battery Status 06/07/2015 OK   Preliminary  . Eval Rhythm 06/07/2015 AF   Preliminary  Clinical Support on 05/26/2015  Component Date Value Ref Range Status  . Date Time Interrogation Session 05/26/2015 [id number]   Preliminary  . Pulse Generator Manufacturer 05/26/2015 MERM   Preliminary  . Pulse Gen Model 05/26/2015 [id number] Reveal LINQ   Preliminary  . Pulse Gen Serial Number 05/26/2015 [id number]   Preliminary  . Implantable Pulse Generator Type 05/26/2015 ICM/ILR   Preliminary  . Implantable Pulse Generator Implan* 05/26/2015 [id number]   Preliminary  . Zone Setting Type Category 05/26/2015 VF   Preliminary  . Zone Setting Type Category 05/26/2015 VT   Preliminary  . Zone Setting Type Category 05/26/2015 VENTRICULAR_TACHYCARDIA_1   Preliminary  . Zone Setting Type Category 05/26/2015 VENTRICULAR_TACHYCARDIA_2   Preliminary  . Zone Setting Type Category 05/26/2015 ATRIAL_FIBRILLATION   Preliminary  . Zone Setting Type Category 05/26/2015 ATAF   Preliminary  . Zone Setting Type Category 05/26/2015 ASYSTOLE   Preliminary  . Zone Setting Type Category 05/26/2015 BRADYCARDIA   Preliminary  . Battery Status 05/26/2015 OK   Preliminary  .  Eval Rhythm 05/26/2015 SR   Preliminary  Lab on 05/18/2015  Component Date Value Ref Range Status  . TSH 05/17/2015 17.53* 0.41 - 5.90 uIU/mL Final  Lab on 04/27/2015  Component Date Value Ref Range Status  . Hemoglobin 04/26/2015 8.3* 12.0 - 16.0 g/dL Final  . HCT 04/26/2015 25* 36 - 46 % Final  . Platelets 04/26/2015 343  150 - 399 K/L Final  . WBC 04/26/2015 8.4   Final  Clinical Support on 04/26/2015  Component Date Value Ref Range Status  . Date Time Interrogation Session 04/26/2015 [id number]   Preliminary  . Pulse Generator Manufacturer 04/26/2015 MERM   Preliminary  . Pulse Gen Model 04/26/2015 [id number] Reveal LINQ   Preliminary  . Pulse Gen Serial Number 04/26/2015 [id number]   Preliminary  . Implantable Pulse Generator Type 04/26/2015 ICM/ILR   Preliminary  . Implantable Pulse Generator Implan* 04/26/2015 [id number]   Preliminary  . Zone Setting Type Category 04/26/2015 VF   Preliminary  . Zone Setting Type Category 04/26/2015 VT   Preliminary  . Zone Setting Type Category 04/26/2015 VENTRICULAR_TACHYCARDIA_1   Preliminary  . Zone Setting Type Category 04/26/2015 VENTRICULAR_TACHYCARDIA_2   Preliminary  . Zone Setting Type Category 04/26/2015 ATRIAL_FIBRILLATION   Preliminary  . Zone Setting Type Category 04/26/2015 ATAF   Preliminary  .  Zone Setting Type Category 04/26/2015 ASYSTOLE   Preliminary  . Zone Setting Type Category 04/26/2015 BRADYCARDIA   Preliminary  . Battery Status 04/26/2015 OK   Preliminary  . Eval Rhythm 04/26/2015 SR   Preliminary  Lab on 04/23/2015  Component Date Value Ref Range Status  . Hemoglobin 04/20/2015 7.6* 12.0 - 16.0 g/dL Final  . HCT 04/20/2015 22* 36 - 46 % Final  . WBC 04/20/2015 12.0   Final  . Glucose 04/20/2015 97   Final  . BUN 04/20/2015 20  4 - 21 mg/dL Final  . Creatinine 04/20/2015 0.6  0.5 - 1.1 mg/dL Final  . Potassium 04/20/2015 3.7  3.4 - 5.3 mmol/L Final  . Sodium 04/20/2015 132* 137 - 147 mmol/L Final    Admission on 04/17/2015, Discharged on 04/17/2015  Component Date Value Ref Range Status  . WBC 04/17/2015 9.8  4.0 - 10.5 K/uL Final  . RBC 04/17/2015 3.42* 3.87 - 5.11 MIL/uL Final  . Hemoglobin 04/17/2015 11.0* 12.0 - 15.0 g/dL Final  . HCT 04/17/2015 31.7* 36.0 - 46.0 % Final  . MCV 04/17/2015 92.7  78.0 - 100.0 fL Final  . MCH 04/17/2015 32.2  26.0 - 34.0 pg Final  . MCHC 04/17/2015 34.7  30.0 - 36.0 g/dL Final  . RDW 04/17/2015 15.8* 11.5 - 15.5 % Final  . Platelets 04/17/2015 202  150 - 400 K/uL Final  . Sodium 04/17/2015 137  135 - 145 mmol/L Final  . Potassium 04/17/2015 3.9  3.5 - 5.1 mmol/L Final  . Chloride 04/17/2015 97  96 - 112 mmol/L Final  . CO2 04/17/2015 30  19 - 32 mmol/L Final  . Glucose, Bld 04/17/2015 115* 70 - 99 mg/dL Final  . BUN 04/17/2015 27* 6 - 23 mg/dL Final  . Creatinine, Ser 04/17/2015 0.91  0.50 - 1.10 mg/dL Final  . Calcium 04/17/2015 8.3* 8.4 - 10.5 mg/dL Final  . GFR calc non Af Amer 04/17/2015 52* >90 mL/min Final  . GFR calc Af Amer 04/17/2015 61* >90 mL/min Final   Comment: (NOTE) The eGFR has been calculated using the CKD EPI equation. This calculation has not been validated in all clinical situations. eGFR's persistently <90 mL/min signify possible Chronic Kidney Disease.   . Anion gap 04/17/2015 10  5 - 15 Final  Office Visit on 03/26/2015  Component Date Value Ref Range Status  . Sodium 03/26/2015 138  135 - 145 mEq/L Final  . Potassium 03/26/2015 4.3  3.5 - 5.3 mEq/L Final  . Chloride 03/26/2015 96  96 - 112 mEq/L Final  . CO2 03/26/2015 29  19 - 32 mEq/L Final  . Glucose, Bld 03/26/2015 98  70 - 99 mg/dL Final  . BUN 03/26/2015 23  6 - 23 mg/dL Final  . Creat 03/26/2015 0.98  0.50 - 1.10 mg/dL Final  . Calcium 03/26/2015 8.8  8.4 - 10.5 mg/dL Final  . Magnesium 03/26/2015 2.0  1.5 - 2.5 mg/dL Final  . Date Time Interrogation Session 03/26/2015 [id number]   Final  . Implantable Pulse Generator [employer]* 03/26/2015 Medtronic    Final  . Implantable Pulse Generator Model 03/26/2015 [id number] Reveal LINQ   Final  . Implantable Pulse Generator Serial* 03/26/2015 [id number]   Final  . Zone Setting Type Category 03/26/2015 VF   Final  . Zone Setting Type Category 03/26/2015 VT   Final  . Zone Setting Type Category 03/26/2015 VENTRICULAR_TACHYCARDIA_1   Final  . Zone Setting Detection Interval 03/26/2015 430   Final  . Zone  Setting Type Category 03/26/2015 VENTRICULAR_TACHYCARDIA_2   Final  . Zone Setting Type Category 03/26/2015 ATRIAL_FIBRILLATION   Final  . Zone Setting Type Category 03/26/2015 ATAF   Final  . Zone Setting Type Category 03/26/2015 ASYSTOLE   Final  . Zone Setting Detection Interval 03/26/2015 3000   Final  . Zone Setting Type Category 03/26/2015 BRADYCARDIA   Final  . Zone Setting Detection Interval 03/26/2015 2000   Final  . Battery Status 03/26/2015 OK   Final  . Eval Rhythm 03/26/2015 AF   Final  . Miscellaneous Comment 03/26/2015 Loop check in clinic.  Pt with 0 tachy episodes; 0 brady episodes; 8 asystole episodes---max dur. 4 sec (during AF); 0 symptom episodes; 50.2% AF burden + Eliquis.    Plan to continue [facility] and with SK in 86month.   Final  . TSH 03/26/2015 6.835* 0.350 - 4.500 uIU/mL Final  Clinical Support on 03/26/2015  Component Date Value Ref Range Status  . Date Time Interrogation Session 04/21/2015 [id number]  Final  . Implantable Pulse Generator Manufa* 04/21/2015 Medtronic   Final  . Implantable Pulse Generator Model 04/21/2015 [id number]   Final  . Implantable Pulse Generator Serial* 04/21/2015 [id number]   Final  . Zone Setting Type Category 04/21/2015 VF   Final  . Zone Setting Type Category 04/21/2015 VT   Final  . Zone Setting Type Category 04/21/2015 VENTRICULAR_TACHYCARDIA_1   Final  . Zone Setting Type Category 04/21/2015 VENTRICULAR_TACHYCARDIA_2   Final  . Zone Setting Type Category 04/21/2015 ATRIAL_FIBRILLATION   Final  . Zone Setting Type Category  04/21/2015 ATAF   Final  . Zone Setting Type Category 04/21/2015 ASYSTOLE   Final  . Zone Setting Type Category 04/21/2015 BRADYCARDIA   Final  . Battery Status 04/21/2015 OK   Final  . Eval Rhythm 04/21/2015 SR   Final  . Miscellaneous Comment 04/21/2015    Final                   Value:Carelink summary report received. Battery status OK. Normal device function. No symptom episodes, tachy episodes, brady or pause episodes. 1 pause episode--previously reviewed. 715 AF episodes---47% of time. + Eliquis. Monthly summary reports and ROV in  April with SK.   There may be more visits with results that are not included.  Dg Chest 2 View  06/09/2015   CLINICAL DATA:  Shortness of breath, congestive heart failure  EXAM: CHEST  2 VIEW  COMPARISON:  04/17/2015  FINDINGS: Cardiomegaly again noted. No acute infiltrate or pleural effusion. No pulmonary edema. Diffuse thoracic spine osteopenia. There is dextroscoliosis of the lumbar spine. Degenerative changes mid thoracic spine. Again noted old fracture deformity of left proximal humerus.  IMPRESSION: No active disease. Cardiomegaly. Thoracic spine osteopenia. Dextroscoliosis of the lumbar spine.   Electronically Signed   By: LLahoma CrockerM.D.   On: 06/09/2015 16:10     Assessment/Plan 1. Sinus bradycardia - Pacemaker placement today  2. Other fatigue - pacemaker placement today  3. Acute blood loss anemia Resolved, Hgb 12.1 Hct 35.6 on 06/09/15  4. Paroxysmal atrial fibrillation Stable with Amiodarone 200 mg daily Eliquis for risk reduction  5. Fracture of humeral head, left, closed, sequela Healed, no pain Continue PT   6. Essential hypertension Controlled  7. Bilateral edema of lower extremity Improved, continue Torsemide  8. Acute on chronic diastolic CHF (congestive heart failure), NYHA class 3 Clinically compensated  9. Hypothyroidism, unspecified hypothyroidism type 05/17/15 TSH 17.532 - 5/24 Levothyroxine titrated  up to 32mg  daily Manxie to recheck TSH in 4 weeks.   10. Unstable gait Progressing, steady with 4 wheel walker on exam Continue PT as needed

## 2015-06-17 NOTE — Interval H&P Note (Signed)
History and Physical Interval Note:  06/17/2015 2:21 PM  Melanie Obrien  has presented today for surgery, with the diagnosis of bradycardia  The various methods of treatment have been discussed with the patient and family. After consideration of risks, benefits and other options for treatment, the patient has consented to  Procedure(s): Pacemaker Implant (N/A) as a surgical intervention .  The patient's history has been reviewed, patient examined, no change in status, stable for surgery.  I have reviewed the patient's chart and labs.  Questions were answered to the patient's satisfaction.     Sherryl Manges  pacemaker for symptomatci bradycardia

## 2015-06-18 ENCOUNTER — Ambulatory Visit (HOSPITAL_COMMUNITY): Payer: Medicare PPO

## 2015-06-18 ENCOUNTER — Encounter (HOSPITAL_COMMUNITY): Payer: Self-pay | Admitting: Internal Medicine

## 2015-06-18 DIAGNOSIS — I5032 Chronic diastolic (congestive) heart failure: Secondary | ICD-10-CM | POA: Diagnosis not present

## 2015-06-18 DIAGNOSIS — Z4509 Encounter for adjustment and management of other cardiac device: Secondary | ICD-10-CM | POA: Diagnosis not present

## 2015-06-18 DIAGNOSIS — I495 Sick sinus syndrome: Secondary | ICD-10-CM | POA: Diagnosis not present

## 2015-06-18 DIAGNOSIS — I480 Paroxysmal atrial fibrillation: Secondary | ICD-10-CM | POA: Diagnosis not present

## 2015-06-18 MED ORDER — TORSEMIDE 20 MG PO TABS: ORAL_TABLET | ORAL | Status: DC

## 2015-06-18 MED FILL — Heparin Sodium (Porcine) 2 Unit/ML in Sodium Chloride 0.9%: INTRAMUSCULAR | Qty: 500 | Status: AC

## 2015-06-18 MED FILL — Lidocaine HCl Local Preservative Free (PF) Inj 1%: INTRAMUSCULAR | Qty: 30 | Status: AC

## 2015-06-18 NOTE — Discharge Instructions (Signed)
° ° °  Supplemental Discharge Instructions for  °Pacemaker/Defibrillator Patients ° °Activity °No heavy lifting or vigorous activity with your left/right arm for 6 to 8 weeks.  Do not raise your left/right arm above your head for one week.  Gradually raise your affected arm as drawn below. ° °        ° °__         06/22/15                 06/23/15                  06/24/15                 06/25/15 ° ° °WOUND CARE °- Keep the wound area clean and dry.  Do not get this area wet for one week. No showers for one week; you may shower on  06/25/15   . °- The tape/steri-strips on your wound will fall off; do not pull them off.  No bandage is needed on the site.  DO  NOT apply any creams, oils, or ointments to the wound area. °- If you notice any drainage or discharge from the wound, any swelling or bruising at the site, or you develop a fever > 101? F after you are discharged home, call the office at once. ° °Special Instructions °- You are still able to use cellular telephones; use the ear opposite the side where you have your pacemaker/defibrillator.  Avoid carrying your cellular phone near your device. °- When traveling through airports, show security personnel your identification card to avoid being screened in the metal detectors.  Ask the security personnel to use the hand wand. °- Avoid arc welding equipment, MRI testing (magnetic resonance imaging), TENS units (transcutaneous nerve stimulators).  Call the office for questions about other devices. °- Avoid electrical appliances that are in poor condition or are not properly grounded. °- Microwave ovens are safe to be near or to operate. ° °

## 2015-06-18 NOTE — Discharge Summary (Signed)
ELECTROPHYSIOLOGY PROCEDURE DISCHARGE SUMMARY    Patient ID: Melanie Obrien,  MRN: [id number], DOB/AGE: 79/01/1919 79 y.o.  Admit date: 06/17/2015 Discharge date: 06/18/2015  Primary Care Physician: Gaye Alken, MD Electrophysiologist: Graciela Husbands  Primary Discharge Diagnosis:  Symptomatic bradycardia status post pacemaker implantation this admission  Secondary Discharge Diagnosis:  1.  Paroxysmal atrial fibrillation 2.  Chronic diastolic heart failure 3.  Prior TIA 4.  Syncope  Allergies  Allergen Reactions  . Codeine Other (See Comments)    unknown  . Levaquin [Levofloxacin In D5w] Itching  . Loratadine     TACHYCARDIA  . Xylocaine [Lidocaine Hcl] Other (See Comments)    Uncontrolled shaking     Procedures This Admission:  1.  Implantation of a STJ dual chamber PPM on 06/17/15 by Dr Graciela Husbands.  See op note for full details.  Previously implanted loop recorder was removed.  There were no immediate post procedure complications. 2.  CXR on 06/18/15 demonstrated no pneumothorax status post device implantation.   Brief HPI: Melanie Obrien is a 81 y.o. female with a past medical history as outlined above.  She has had prior syncope and symptomatic atrial fibrillation with medical therapy limited by bradycardia.  Risks, benefits, and alternatives to PPM implantation were reviewed with the patient who wished to proceed.   Hospital Course:  The patient was admitted and underwent implantation of a STJ dual chamber pacemaker and ILR explant with details as outlined above.  She  was monitored on telemetry overnight which demonstrated atrial pacing with intrinsic ventricular conduction.  Left chest was without hematoma or ecchymosis.  The device was interrogated and found to be functioning normally.  CXR was obtained and demonstrated no pneumothorax status post device implantation.  Wound care, arm mobility, and restrictions were reviewed with the patient.  The patient was examined  and considered stable for discharge to home.   Renal function elevated - will decrease Torsemide to 20mg  daily and recheck BMET at wound check.    Physical Exam: Filed Vitals:   06/17/15 1800 06/17/15 1824 06/17/15 1947 06/18/15 0505  BP: 135/107 132/79 102/35 140/48  Pulse: 59 54 60 59  Temp:   98.2 F (36.8 C) 98.4 F (36.9 C)  TempSrc:   Axillary Oral  Resp:   18 18  Height:      Weight:    96 lb 1.9 oz (43.6 kg)  SpO2: 96%  99% 93%    GEN- The patient is elderly appearing, alert and oriented x 3 today.   HEENT: normocephalic, atraumatic; sclera clear, conjunctiva pink; hearing intact; oropharynx clear; neck supple Lungs- Clear to ausculation bilaterally, normal work of breathing.  No wheezes, rales, rhonchi Heart- Regular rate and rhythm  GI- soft, non-tender, non-distended, bowel sounds present  Extremities- no clubbing, cyanosis, or edema  MS- no significant deformity or atrophy Skin- warm and dry, no rash or lesion, left chest without hematoma/ecchymosis Psych- euthymic mood, full affect Neuro- strength and sensation are intact   Labs:   Lab Results  Component Value Date   WBC 9.9 06/09/2015   HGB 12.1 06/09/2015   HCT 35.6* 06/09/2015   MCV 95.2 06/09/2015   PLT 218 06/09/2015   No results for input(s): NA, K, CL, CO2, BUN, CREATININE, CALCIUM, PROT, BILITOT, ALKPHOS, ALT, AST, GLUCOSE in the last 168 hours.  Invalid input(s): LABALBU  Discharge Medications:    Medication List    TAKE these medications        amiodarone 200 MG  tablet  Commonly known as:  PACERONE  Take 200 mg by mouth daily.     apixaban 2.5 MG Tabs tablet  Commonly known as:  ELIQUIS  Take 1 tablet (2.5 mg total) by mouth 2 (two) times daily.     b complex vitamins tablet  Take 0.5 tablets by mouth 3 (three) times daily.     estradiol 1 MG tablet  Commonly known as:  ESTRACE  Take 0.5 mg by mouth daily.     ferrous sulfate 325 (65 FE) MG EC tablet  Take 325 mg by mouth  daily.     fexofenadine 180 MG tablet  Commonly known as:  ALLEGRA  Take 1 tablet (180 mg total) by mouth daily.     levothyroxine 50 MCG tablet  Commonly known as:  SYNTHROID, LEVOTHROID  Take 50 mcg by mouth daily before breakfast.     MAGNESIUM PO  Take 1 tablet by mouth daily.     multivitamin with minerals Tabs tablet  Take 1 tablet by mouth daily.     NASACORT ALLERGY 24HR 55 MCG/ACT Aero nasal inhaler  Generic drug:  triamcinolone  Place 1 spray into the nose daily.     pregabalin 25 MG capsule  Commonly known as:  LYRICA  Take 50 mg by mouth 2 (two) times daily.     progesterone 100 MG capsule  Commonly known as:  PROMETRIUM  Take 100 mg by mouth every other day.     torsemide 20 MG tablet  Commonly known as:  DEMADEX  Take 1 tablet daily     Vitamin D3 5000 UNITS Tabs  Take 1 tablet by mouth daily.        Disposition:  Discharge Instructions    Diet - low sodium heart healthy    Complete by:  As directed      Increase activity slowly    Complete by:  As directed           Follow-up Information    Follow up with [facility] On 07/01/2015.   Specialty:  Cardiology   Why:  at [facility] for wound check   Contact information:   [address], [address] [place] [address] [phone]      Follow up with Sherryl Manges, MD On 09/24/2015.   Specialty:  Cardiology   Why:  at 12:15PM   Contact information:   [address] [place] [address] [phone]       Duration of Discharge Encounter: Greater than 30 minutes including physician time.  Signed, Gypsy Balsam, NP 06/18/2015 8:54 AM

## 2015-06-21 ENCOUNTER — Telehealth: Payer: Self-pay | Admitting: Internal Medicine

## 2015-06-21 NOTE — Telephone Encounter (Signed)
New Message  Pt daughter called states that the pt is scheduled for a routine Dental cleaning. Pt recently had a Pacer implanted, should they hold off on the dental cleaning? Please call back to discuss.

## 2015-06-22 ENCOUNTER — Encounter: Payer: Self-pay | Admitting: Cardiology

## 2015-06-22 NOTE — Telephone Encounter (Signed)
Left detailed message on Dr. Mickie KayMcNeil's personal voicemail - advising to wait 6 weeks post implant before routine dental cleaning/check-up.

## 2015-06-24 ENCOUNTER — Other Ambulatory Visit: Payer: Self-pay | Admitting: *Deleted

## 2015-06-24 DIAGNOSIS — Z79899 Other long term (current) drug therapy: Secondary | ICD-10-CM

## 2015-07-01 ENCOUNTER — Ambulatory Visit (INDEPENDENT_AMBULATORY_CARE_PROVIDER_SITE_OTHER): Payer: Medicare PPO | Admitting: *Deleted

## 2015-07-01 ENCOUNTER — Encounter: Payer: Self-pay | Admitting: Cardiology

## 2015-07-01 ENCOUNTER — Other Ambulatory Visit (INDEPENDENT_AMBULATORY_CARE_PROVIDER_SITE_OTHER): Payer: Medicare PPO | Admitting: *Deleted

## 2015-07-01 DIAGNOSIS — R001 Bradycardia, unspecified: Secondary | ICD-10-CM

## 2015-07-01 DIAGNOSIS — Z79899 Other long term (current) drug therapy: Secondary | ICD-10-CM | POA: Diagnosis not present

## 2015-07-01 LAB — CUP PACEART INCLINIC DEVICE CHECK
Battery Voltage: 3.04 V
Brady Statistic RA Percent Paced: 94 %
Brady Statistic RV Percent Paced: 65 %
Date Time Interrogation Session: 20160707175740
Lead Channel Impedance Value: 562.5 Ohm
Lead Channel Impedance Value: 687.5 Ohm
Lead Channel Pacing Threshold Amplitude: 0.75 V
Lead Channel Pacing Threshold Pulse Width: 0.4 ms
Lead Channel Sensing Intrinsic Amplitude: 9.5 mV
Lead Channel Setting Pacing Amplitude: 3.5 V
Lead Channel Setting Pacing Amplitude: 3.5 V
Lead Channel Setting Pacing Pulse Width: 0.4 ms
Lead Channel Setting Sensing Sensitivity: 2 mV
MDC IDC MSMT BATTERY REMAINING LONGEVITY: 79.2 mo
MDC IDC MSMT LEADCHNL RA PACING THRESHOLD AMPLITUDE: 1.25 V
MDC IDC MSMT LEADCHNL RA PACING THRESHOLD PULSEWIDTH: 0.4 ms
MDC IDC MSMT LEADCHNL RA SENSING INTR AMPL: 0.5 mV
Pulse Gen Serial Number: 7779640

## 2015-07-01 NOTE — Progress Notes (Signed)
Wound check appointment. Steri-strips removed. Wound without redness or edema. Incision edges approximated, wound well healed. ILR explant site healing well. Normal device function. Thresholds, sensing, and impedances consistent with implant measurements. RA threshold slightly elevated since implant- no programming changes warranted at this time. Device programmed at 3.5V for extra safety margin until 3 month visit. Histogram distribution appropriate for patient and level of activity. No mode switches or high ventricular rates noted. Patient educated about wound care, arm mobility, lifting restrictions. ROV 09-24-15 with SK.

## 2015-07-02 ENCOUNTER — Encounter: Payer: Self-pay | Admitting: Internal Medicine

## 2015-07-08 ENCOUNTER — Other Ambulatory Visit: Payer: Self-pay | Admitting: Internal Medicine

## 2015-07-13 ENCOUNTER — Other Ambulatory Visit: Payer: Self-pay

## 2015-07-13 MED ORDER — APIXABAN 2.5 MG PO TABS: 2.5000 mg | ORAL_TABLET | Freq: Two times a day (BID) | ORAL | Status: DC

## 2015-07-19 ENCOUNTER — Encounter: Payer: Self-pay | Admitting: Internal Medicine

## 2015-08-02 ENCOUNTER — Encounter: Payer: Self-pay | Admitting: Internal Medicine

## 2015-08-10 ENCOUNTER — Ambulatory Visit
Admission: RE | Admit: 2015-08-10 | Discharge: 2015-08-10 | Disposition: A | Discharge status: Home / Self Care | Payer: Medicare PPO | Source: Ambulatory Visit | Attending: Family Medicine | Admitting: Family Medicine

## 2015-08-10 ENCOUNTER — Other Ambulatory Visit: Payer: Self-pay | Admitting: Family Medicine

## 2015-08-10 DIAGNOSIS — R52 Pain, unspecified: Secondary | ICD-10-CM

## 2015-09-24 ENCOUNTER — Ambulatory Visit (INDEPENDENT_AMBULATORY_CARE_PROVIDER_SITE_OTHER): Payer: Medicare PPO | Admitting: Internal Medicine

## 2015-09-24 ENCOUNTER — Encounter: Payer: Self-pay | Admitting: Internal Medicine

## 2015-09-24 VITALS — BP 128/64 | HR 64 | Ht <= 58 in | Wt 96.0 lb

## 2015-09-24 DIAGNOSIS — Z45018 Encounter for adjustment and management of other part of cardiac pacemaker: Secondary | ICD-10-CM | POA: Diagnosis not present

## 2015-09-24 DIAGNOSIS — I480 Paroxysmal atrial fibrillation: Secondary | ICD-10-CM

## 2015-09-24 DIAGNOSIS — I495 Sick sinus syndrome: Secondary | ICD-10-CM | POA: Diagnosis not present

## 2015-09-24 DIAGNOSIS — R001 Bradycardia, unspecified: Secondary | ICD-10-CM | POA: Diagnosis not present

## 2015-09-24 NOTE — Patient Instructions (Signed)
Medication Instructions:  Your physician recommends that you continue on your current medications as directed. Please refer to the Current Medication list given to you today.  Labwork: None ordered  Testing/Procedures: None ordered  Follow-Up: Remote monitoring is used to monitor your Pacemaker of ICD from home. This monitoring reduces the number of office visits required to check your device to one time per year. It allows us to keep an eye on the functioning of your device to ensure it is working properly. You are scheduled for a device check from home on 12/28/15. You may send your transmission at any time that day. If you have a wireless device, the transmission will be sent automatically. After your physician reviews your transmission, you will receive a postcard with your next transmission date.  Your physician wants you to follow-up in: 9 months with Dr. Klein. You will receive a reminder letter in the mail two months in advance. If you don't receive a letter, please call our office to schedule the follow-up appointment.   Any Other Special Instructions Will Be Listed Below (If Applicable). Thank you for choosing [facility]!!         

## 2015-09-24 NOTE — Progress Notes (Signed)
Patient Care Team: Juluis Rainier, MD as PCP - General (Family Medicine) Duke Salvia, MD as Consulting Physician (Cardiology) Corky Crafts, MD as Consulting Physician (Interventional Cardiology) Sheral Apley, MD as Attending Physician (Orthopedic Surgery)   HPI  Melanie Obrien is a 81 y.o. female Seen to establish follow-up for her implantable loop recorder. She has a history of a TIA and atrial fibrillation detected on her monitor. She is here with her daughter Dr. [name].  She has a history of acute on chronic HFpEF treated with diuresis. At her last visit we change her furosemide to torsemide with a marvelous response. We also switched her from Plavix to apixaban.    She then had problems with bradycardia which were increasingly concerning with symptoms. She underwent pacemaker generator implantation 6/16.  She has been exceptionally better since then.  Edema is improved. Energy level is improved.     Past Medical History  Diagnosis Date  . Sinus bradycardia   . TIA (transient ischemic attack) 2000's X 1  . Trigeminal neuralgia of left side of face 12/13/2014  . Acute on chronic diastolic heart failure   . Paroxysmal atrial fibrillation   . CHF (congestive heart failure)   . Unstable gait    . Idiopathic scoliosis    . Gluten intolerance    . Complication of anesthesia     "30 minutes violent shaking after a dental procedure"   . Heart murmur   . Pneumonia 2011; 11/2014  . Pleural effusion 2011    "hugh w/pneumonia"  . Hypothyroidism     "from the aminodarone"  . Degenerative disc disease, lumbar    . Arthritis     "plenty" (06/17/2015)  . Chronic back pain   . AICD (automatic cardioverter/defibrillator) present     Past Surgical History  Procedure Laterality Date  . Tonsillectomy  1944  . Toenail avulsion Left     2nd digit  . Loop recorder implant  08/2013    explanted 06/17/2015  . Cataract extraction w/ intraocular lens   implant, bilateral Bilateral 1991-1992  . Bi-ventricular implantable cardioverter defibrillator  (crt-d)  06/17/2015  . Ep implantable device N/A 06/17/2015    Procedure: Pacemaker Implant;  Surgeon: Duke Salvia, MD;  Location: [facility] INVASIVE CV LAB;  Service: Cardiovascular;  Laterality: N/A;    Current Outpatient Prescriptions  Medication Sig Dispense Refill  . amiodarone (PACERONE) 200 MG tablet Take 200 mg by mouth daily.    Marland Kitchen apixaban (ELIQUIS) 2.5 MG TABS tablet Take 1 tablet (2.5 mg total) by mouth 2 (two) times daily. 60 tablet 5  . b complex vitamins tablet Take 0.5 tablets by mouth 3 (three) times daily.     . Cholecalciferol (VITAMIN D3) 5000 UNITS TABS Take 1 tablet by mouth daily.     Marland Kitchen estradiol (ESTRACE) 1 MG tablet Take 0.5 mg by mouth daily.    . fexofenadine (ALLEGRA) 180 MG tablet Take 1 tablet (180 mg total) by mouth daily. 30 tablet 3  . levothyroxine (SYNTHROID, LEVOTHROID) 75 MCG tablet Take 75 mcg by mouth daily before breakfast.    . MAGNESIUM PO Take 1 tablet by mouth daily.    . Multiple Vitamin (MULTIVITAMIN WITH MINERALS) TABS tablet Take 1 tablet by mouth daily.    . pregabalin (LYRICA) 25 MG capsule Take 25 mg by mouth 2 (two) times daily.     . progesterone (PROMETRIUM) 100 MG capsule Take 100 mg by mouth every other  day.     . torsemide (DEMADEX) 20 MG tablet Take 1 tablet daily 45 tablet 3   No current facility-administered medications for this visit.    Social History   Social History  . Marital Status: Widowed    Spouse Name: N/A  . Number of Children: N/A  . Years of Education: N/A   Occupational History  . Not on file.   Social History Main Topics  . Smoking status: Never Smoker   . Smokeless tobacco: Never Used  . Alcohol Use: No  . Drug Use: No  . Sexual Activity: No   Other Topics Concern  . Not on file   Social History Narrative     Review of Systems negative except from HPI and PMH  Physical Exam BP 128/64 mmHg  Pulse 64   Ht  (1.397 m)  Wt 96 lb (43.545 kg)  BMI 22.31 kg/m2 Well developed and well nourished in no acute distress HENT normal E scleral and icterus clear Neck Supple JVP 6-8 ; carotids brisk and full Clear to ausculation Device pocket well healed; without hematoma or erythema.  There is no tethering  Regular rate and rhythm, 3/6  Murmur at apex   gallops or rub Soft with active bowel sounds No clubbing cyanosis 1+ Edema Alert and oriented, grossly normal motor and sensory function Skin Warm and Dry  ECG demonstrates  Atrial pacing with intermittent loss of atrial capture and subsequent ventricular pacing Intrinsic conduction occurs at about 350 ms with evidence of pseudofusion     Assessment and  Plan  Atrial fibrillation-paroxysmal  Sinus bradycardia post termination pauses  Pacemaker-St. Jude  First-degree AV block/left axis deviation  chronic HFpEF  History of TIA  The patient's atrial fibrillation burden is considerably reduced.  Symptoms are much improved.  Atrial capture threshold is borderline and outputs were increased with a pulse with increased from 0.5--1.0 ms  She anticipates a need for tooth extraction. With her history of TIA would like to minimize her time off anticoagulation. She is advised to take her apixaban last dose 2 nights before her procedure

## 2015-09-28 LAB — CUP PACEART INCLINIC DEVICE CHECK
Battery Remaining Longevity: 100.8 mo
Brady Statistic RV Percent Paced: 26 %
Date Time Interrogation Session: 20160930164402
Lead Channel Impedance Value: 662.5 Ohm
Lead Channel Pacing Threshold Amplitude: 0.75 V
Lead Channel Pacing Threshold Amplitude: 0.75 V
Lead Channel Pacing Threshold Pulse Width: 0.4 ms
Lead Channel Pacing Threshold Pulse Width: 1 ms
Lead Channel Sensing Intrinsic Amplitude: 0.4 mV
Lead Channel Sensing Intrinsic Amplitude: 9.1 mV
Lead Channel Setting Pacing Amplitude: 2.5 V
Lead Channel Setting Sensing Sensitivity: 2 mV
MDC IDC MSMT BATTERY VOLTAGE: 3.01 V
MDC IDC MSMT LEADCHNL RA IMPEDANCE VALUE: 537.5 Ohm
MDC IDC SET LEADCHNL RV PACING AMPLITUDE: 2.5 V
MDC IDC SET LEADCHNL RV PACING PULSEWIDTH: 0.4 ms
MDC IDC STAT BRADY RA PERCENT PACED: 77 %
Pulse Gen Model: 2240
Pulse Gen Serial Number: 7779640

## 2015-10-22 ENCOUNTER — Other Ambulatory Visit: Payer: Self-pay | Admitting: Family Medicine

## 2015-10-22 ENCOUNTER — Ambulatory Visit
Admission: RE | Admit: 2015-10-22 | Discharge: 2015-10-22 | Disposition: A | Discharge status: Home / Self Care | Payer: Medicare PPO | Source: Ambulatory Visit | Attending: Family Medicine | Admitting: Family Medicine

## 2015-10-22 DIAGNOSIS — M546 Pain in thoracic spine: Secondary | ICD-10-CM

## 2015-10-22 DIAGNOSIS — M545 Low back pain: Secondary | ICD-10-CM

## 2015-11-10 IMAGING — CR DG SHOULDER 2+V*L*
2 series · 2 of 2 positions shown · non-contrast
Comparison: None.

CLINICAL DATA: Status post fall from standing, with injury to the
left shoulder. Left shoulder pain, swelling and bruising. Limited
range of motion. Initial encounter.

EXAM:
LEFT SHOULDER - 2+ VIEW

[shoulder grashey]
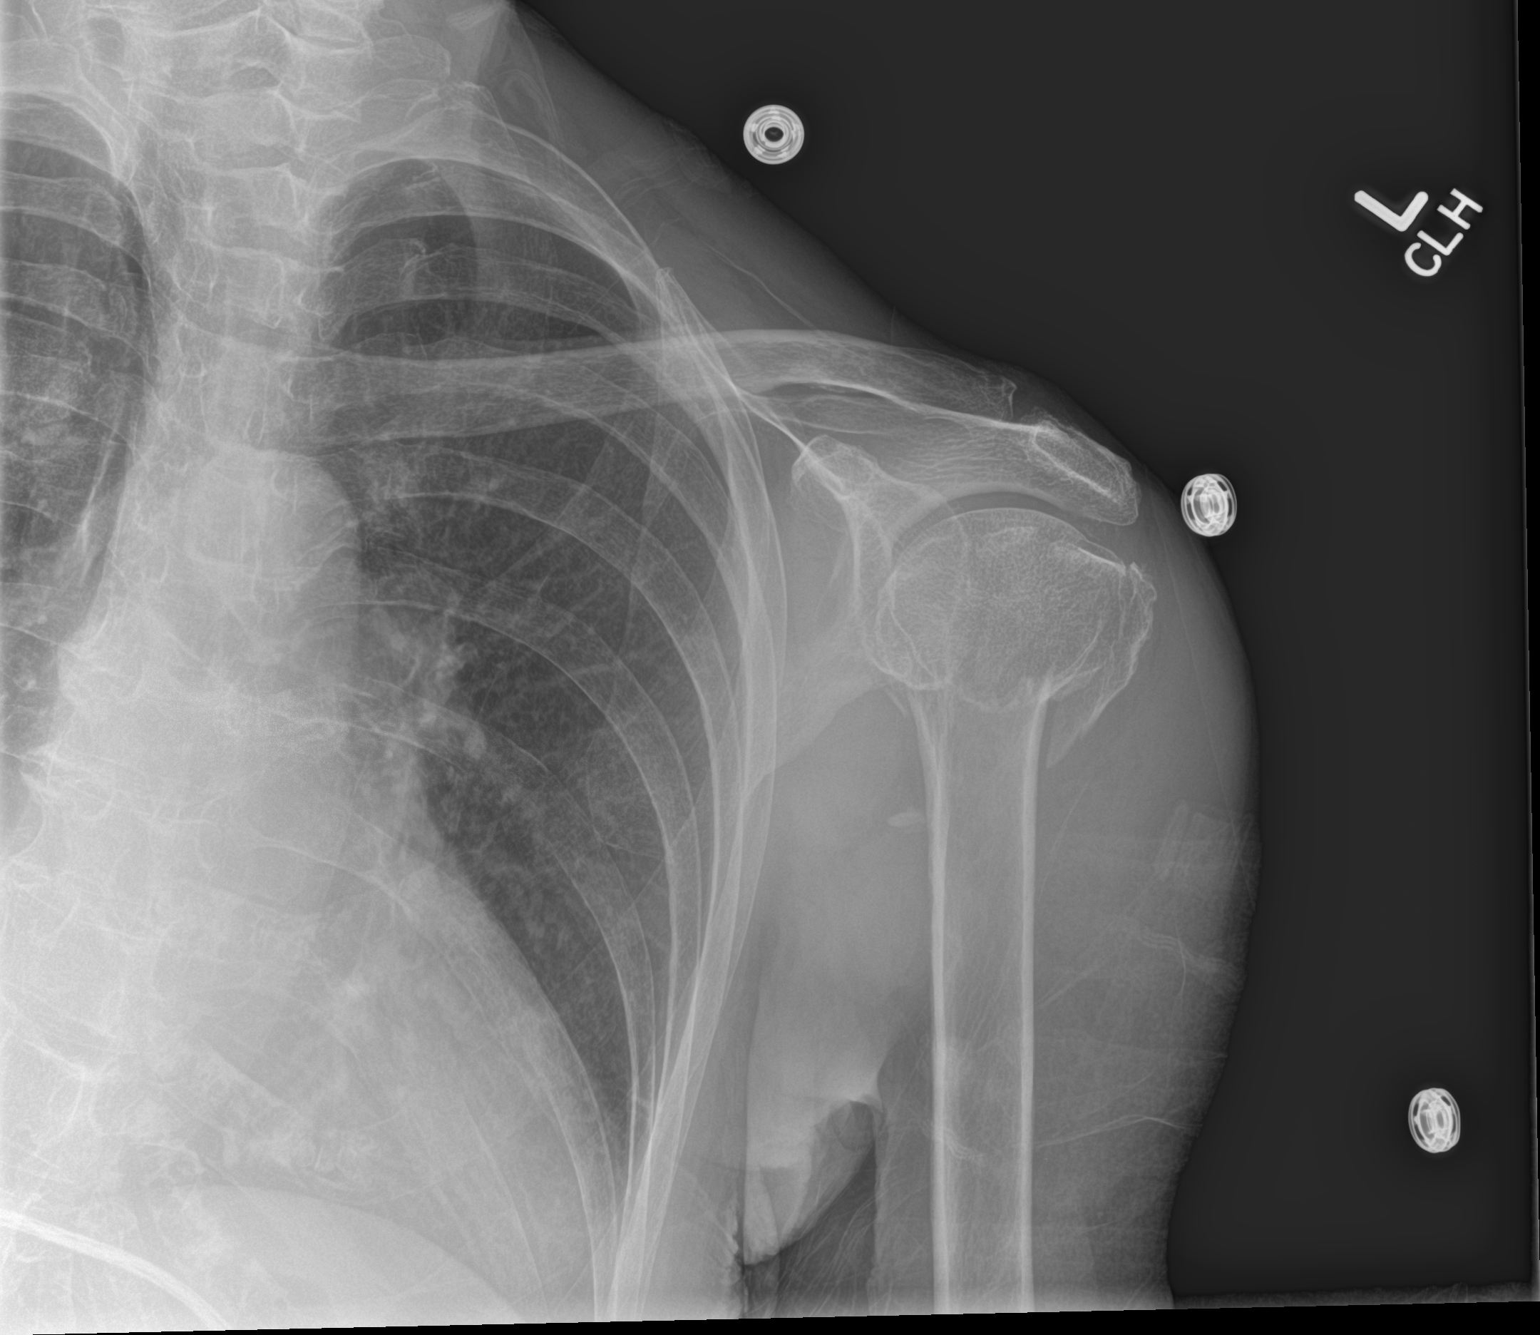

[shoulder y view]
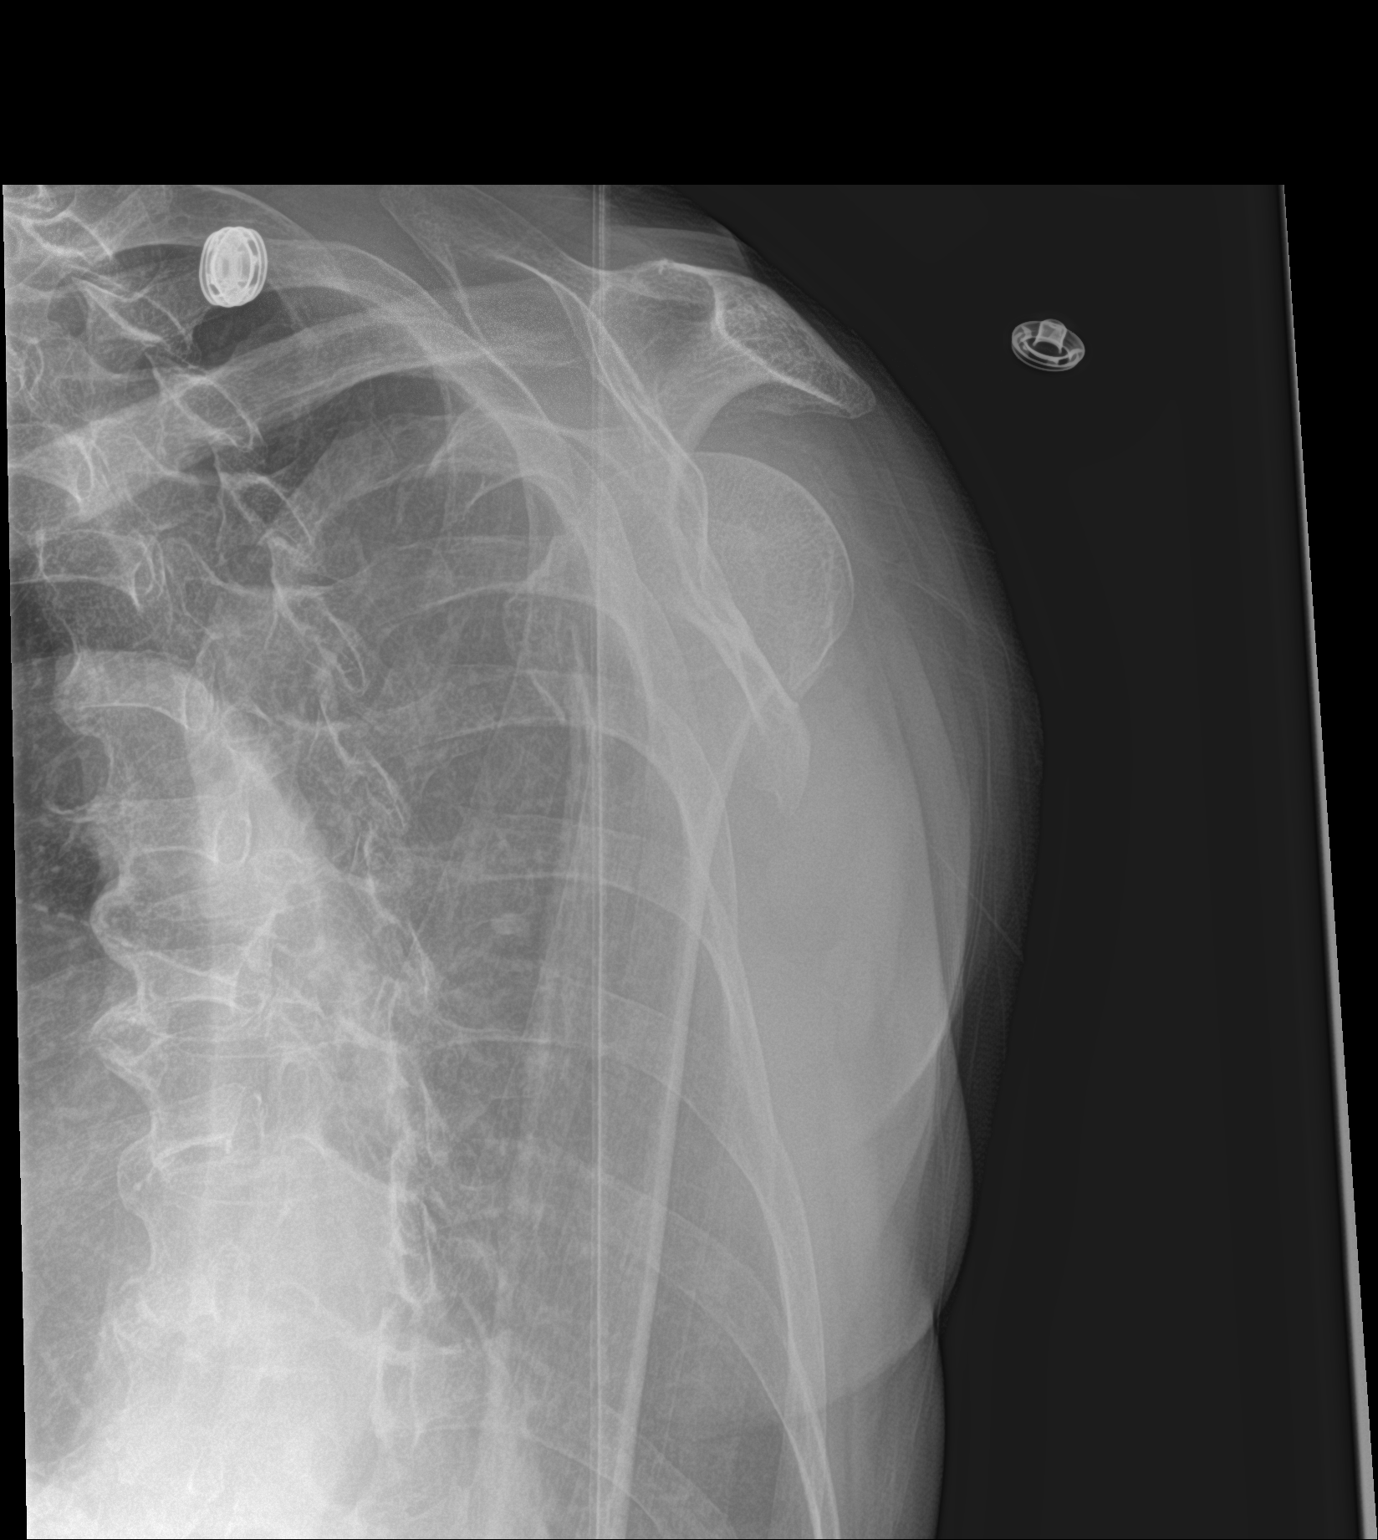

[2 of 2 positions shown; findings below may reference images not displayed]

FINDINGS: There is a comminuted fracture involving the left humeral head and
neck, with a displaced greater tuberosity fragment. There is no
evidence of dislocation. Humeral head fragments remain seated at the
glenoid fossa.

No additional fractures are seen. The visualized portions of the
lungs are clear. The left acromioclavicular joint is unremarkable in
appearance.
IMPRESSION: Comminuted fracture involving the left humeral head and neck, with
displaced greater tuberosity fragment. No evidence of dislocation.

## 2015-11-10 IMAGING — CR DG HIP (WITH OR WITHOUT PELVIS) 2-3V*L*
3 series · 3 of 3 positions shown · non-contrast
Comparison: None.

CLINICAL DATA: Left hip pain after a fall.

EXAM:
LEFT HIP (WITH PELVIS) 2-3 VIEWS

[t pelvis ap]
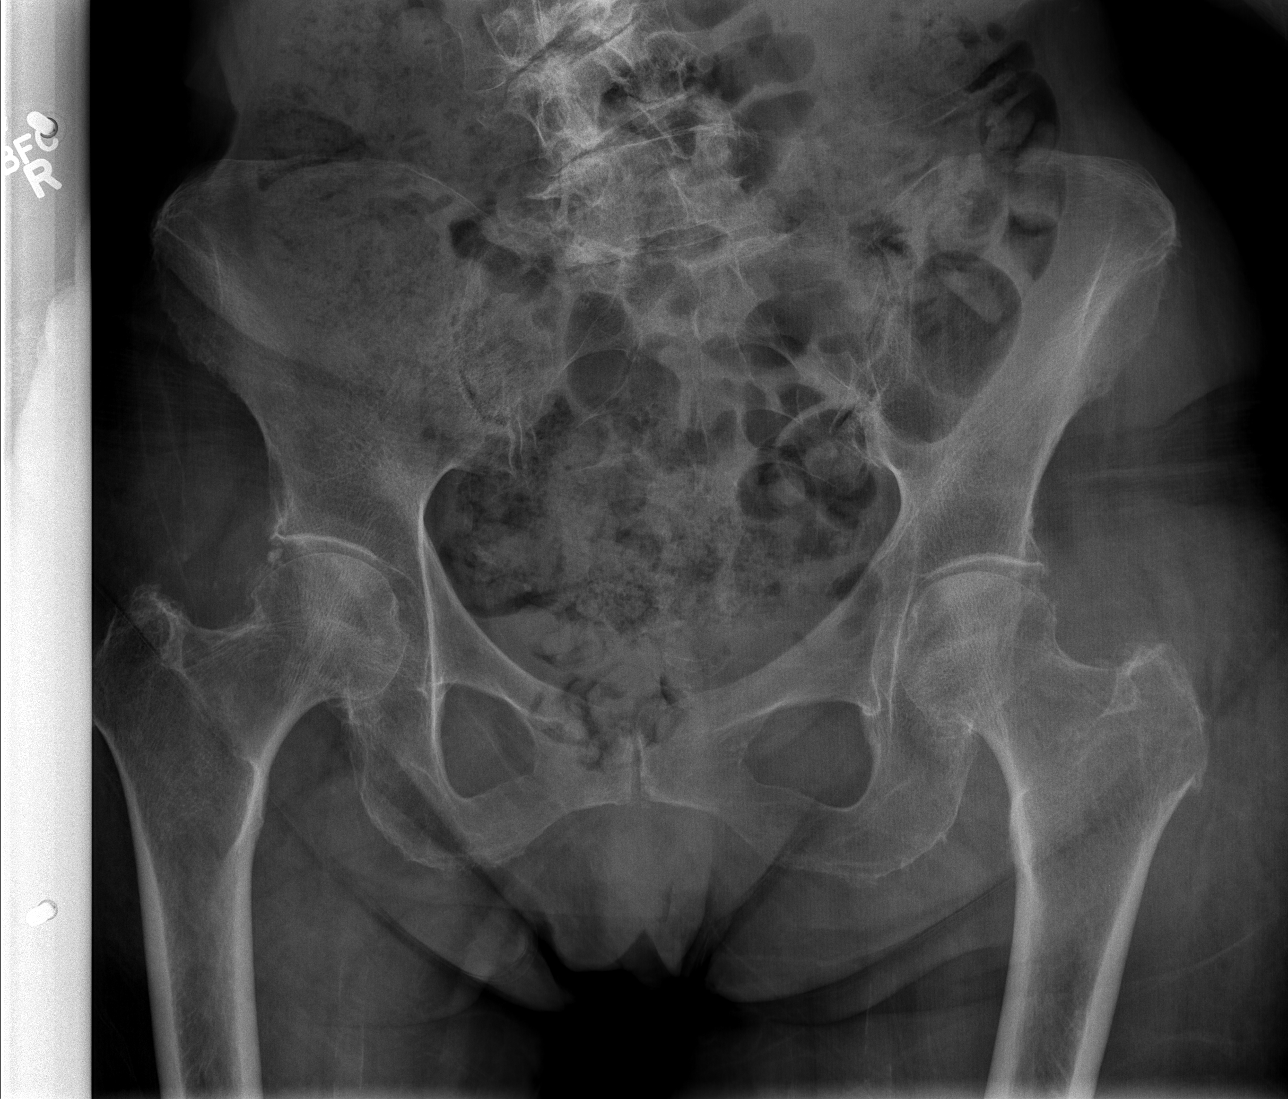

[t hip ap left]
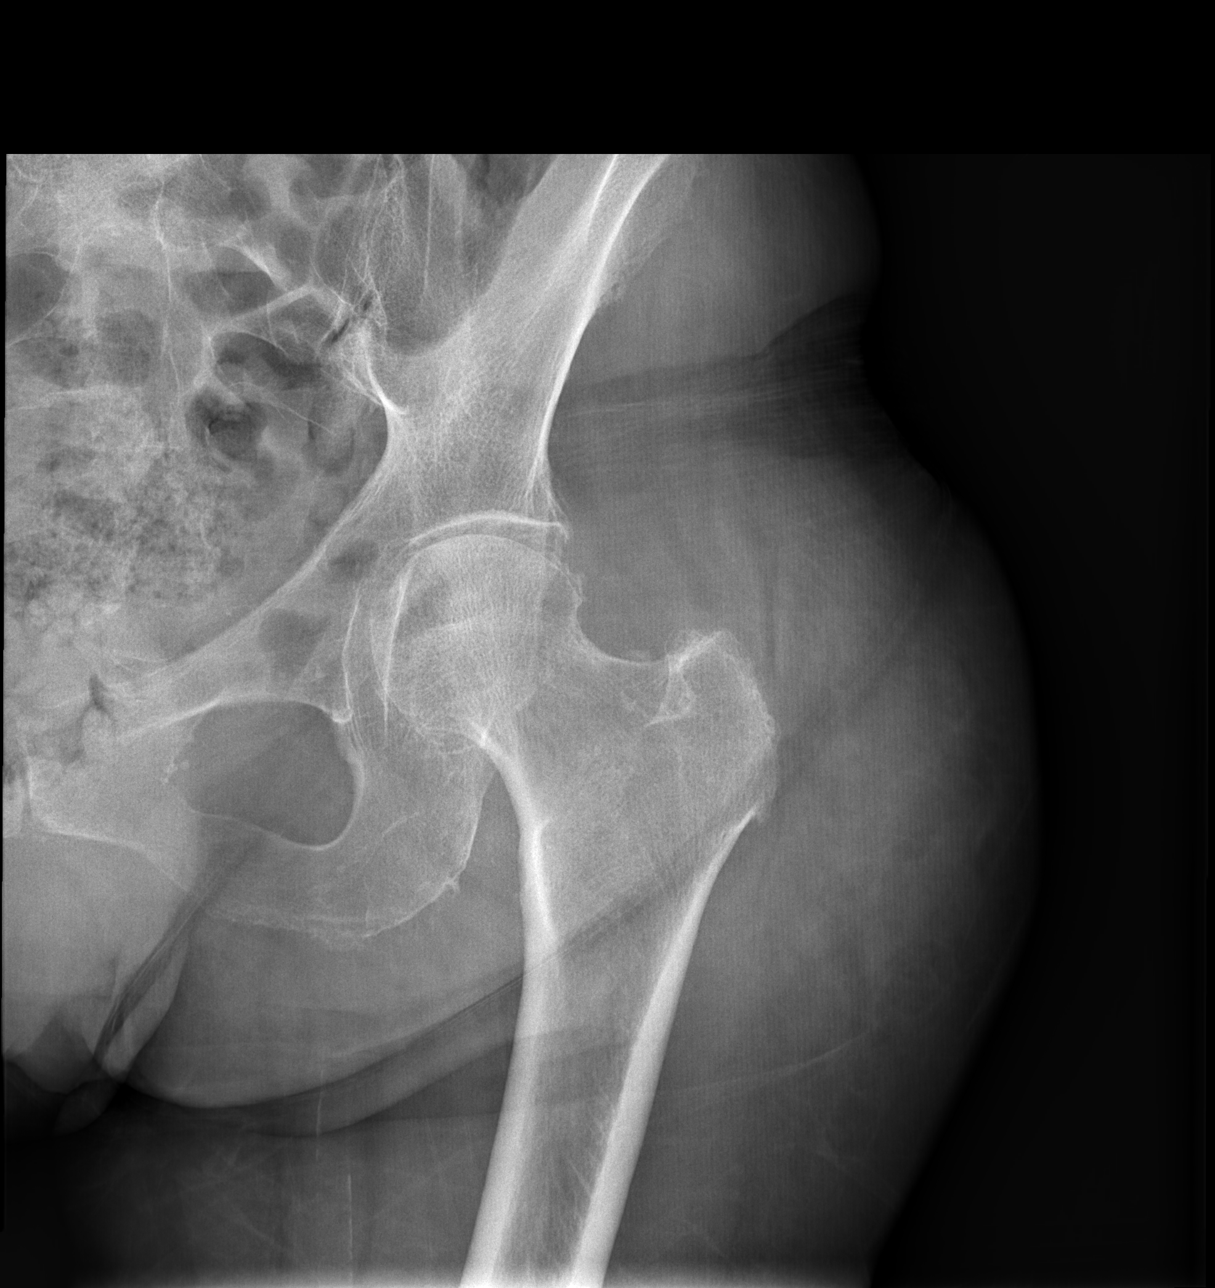

[t hip frog leg left]
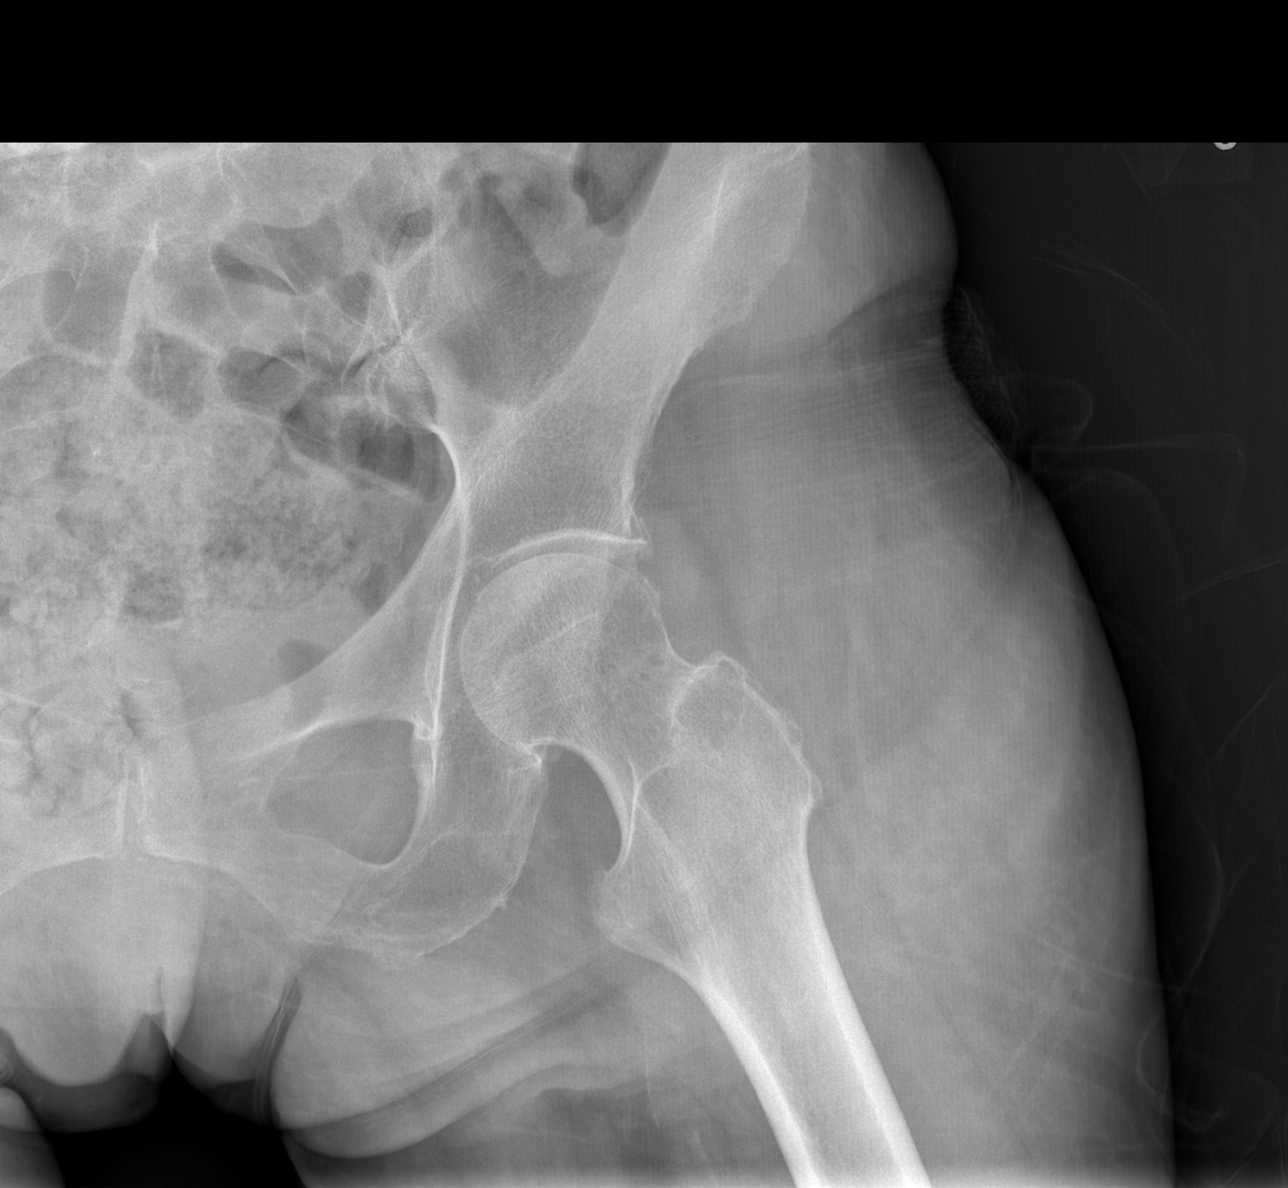

[3 of 3 positions shown; findings below may reference images not displayed]

FINDINGS: There is no fracture or dislocation. There are slight osteophytes on
the left femoral head and on the acetabulum. Fairly severe
degenerative disc disease in the lumbar spine with a scoliosis.
IMPRESSION: No acute abnormalities.

## 2015-11-24 ENCOUNTER — Emergency Department (HOSPITAL_COMMUNITY): Payer: Medicare PPO

## 2015-11-24 ENCOUNTER — Encounter (HOSPITAL_COMMUNITY): Payer: Self-pay | Admitting: *Deleted

## 2015-11-24 ENCOUNTER — Emergency Department (HOSPITAL_COMMUNITY)
Admission: EM | Admit: 2015-11-24 | Discharge: 2015-11-24 | Disposition: A | Discharge status: Home / Self Care | Payer: Medicare PPO | Attending: Emergency Medicine | Admitting: Emergency Medicine

## 2015-11-24 DIAGNOSIS — E079 Disorder of thyroid, unspecified: Secondary | ICD-10-CM | POA: Insufficient documentation

## 2015-11-24 DIAGNOSIS — I4891 Unspecified atrial fibrillation: Secondary | ICD-10-CM | POA: Insufficient documentation

## 2015-11-24 DIAGNOSIS — I509 Heart failure, unspecified: Secondary | ICD-10-CM | POA: Insufficient documentation

## 2015-11-24 DIAGNOSIS — Z7901 Long term (current) use of anticoagulants: Secondary | ICD-10-CM | POA: Insufficient documentation

## 2015-11-24 DIAGNOSIS — Z950 Presence of cardiac pacemaker: Secondary | ICD-10-CM | POA: Insufficient documentation

## 2015-11-24 DIAGNOSIS — R51 Headache: Secondary | ICD-10-CM | POA: Insufficient documentation

## 2015-11-24 DIAGNOSIS — Z79899 Other long term (current) drug therapy: Secondary | ICD-10-CM | POA: Insufficient documentation

## 2015-11-24 DIAGNOSIS — R079 Chest pain, unspecified: Secondary | ICD-10-CM | POA: Insufficient documentation

## 2015-11-24 DIAGNOSIS — Z8673 Personal history of transient ischemic attack (TIA), and cerebral infarction without residual deficits: Secondary | ICD-10-CM | POA: Insufficient documentation

## 2015-11-24 DIAGNOSIS — I10 Essential (primary) hypertension: Secondary | ICD-10-CM | POA: Diagnosis not present

## 2015-11-24 HISTORY — DX: Essential (primary) hypertension: I10

## 2015-11-24 HISTORY — DX: Cerebral infarction, unspecified: I63.9

## 2015-11-24 HISTORY — DX: Disorder of thyroid, unspecified: E07.9

## 2015-11-24 LAB — CBC WITH DIFFERENTIAL/PLATELET
BASOS ABS: 0.1 10*3/uL (ref 0.0–0.1)
BASOS PCT: 1 %
Eosinophils Absolute: 0.1 10*3/uL (ref 0.0–0.7)
Eosinophils Relative: 1 %
HCT: 35.3 % — ABNORMAL LOW (ref 36.0–46.0)
HEMOGLOBIN: 11.9 g/dL — AB (ref 12.0–15.0)
LYMPHS ABS: 0.9 10*3/uL (ref 0.7–4.0)
Lymphocytes Relative: 12 %
MCH: 31.9 pg (ref 26.0–34.0)
MCHC: 33.7 g/dL (ref 30.0–36.0)
MCV: 94.6 fL (ref 78.0–100.0)
MONO ABS: 0.7 10*3/uL (ref 0.1–1.0)
MONOS PCT: 9 %
NEUTROS ABS: 6.1 10*3/uL (ref 1.7–7.7)
NEUTROS PCT: 77 %
Platelets: 204 10*3/uL (ref 150–400)
RBC: 3.73 MIL/uL — AB (ref 3.87–5.11)
RDW: 14.8 % (ref 11.5–15.5)
WBC: 8 10*3/uL (ref 4.0–10.5)

## 2015-11-24 LAB — I-STAT CHEM 8, ED
BUN: 17 mg/dL (ref 6–20)
CALCIUM ION: 1.03 mmol/L — AB (ref 1.13–1.30)
CHLORIDE: 97 mmol/L — AB (ref 101–111)
CREATININE: 0.6 mg/dL (ref 0.44–1.00)
GLUCOSE: 143 mg/dL — AB (ref 65–99)
HCT: 38 % (ref 36.0–46.0)
HEMOGLOBIN: 12.9 g/dL (ref 12.0–15.0)
POTASSIUM: 4.2 mmol/L (ref 3.5–5.1)
Sodium: 133 mmol/L — ABNORMAL LOW (ref 135–145)
TCO2: 26 mmol/L (ref 0–100)

## 2015-11-24 LAB — I-STAT TROPONIN, ED: TROPONIN I, POC: 0.03 ng/mL (ref 0.00–0.08)

## 2015-11-24 NOTE — Discharge Instructions (Signed)
Nonspecific Chest Pain  °Chest pain can be caused by many different conditions. There is always a chance that your pain could be related to something serious, such as a heart attack or a blood clot in your lungs. Chest pain can also be caused by conditions that are not life-threatening. If you have chest pain, it is very important to follow up with your health care provider. °CAUSES  °Chest pain can be caused by: °· Heartburn. °· Pneumonia or bronchitis. °· Anxiety or stress. °· Inflammation around your heart (pericarditis) or lung (pleuritis or pleurisy). °· A blood clot in your lung. °· A collapsed lung (pneumothorax). It can develop suddenly on its own (spontaneous pneumothorax) or from trauma to the chest. °· Shingles infection (varicella-zoster virus). °· Heart attack. °· Damage to the bones, muscles, and cartilage that make up your chest wall. This can include: °¨ Bruised bones due to injury. °¨ Strained muscles or cartilage due to frequent or repeated coughing or overwork. °¨ Fracture to one or more ribs. °¨ Sore cartilage due to inflammation (costochondritis). °RISK FACTORS  °Risk factors for chest pain may include: °· Activities that increase your risk for trauma or injury to your chest. °· Respiratory infections or conditions that cause frequent coughing. °· Medical conditions or overeating that can cause heartburn. °· Heart disease or family history of heart disease. °· Conditions or health behaviors that increase your risk of developing a blood clot. °· Having had chicken pox (varicella zoster). °SIGNS AND SYMPTOMS °Chest pain can feel like: °· Burning or tingling on the surface of your chest or deep in your chest. °· Crushing, pressure, aching, or squeezing pain. °· Dull or sharp pain that is worse when you move, cough, or take a deep breath. °· Pain that is also felt in your back, neck, shoulder, or arm, or pain that spreads to any of these areas. °Your chest pain may come and go, or it may stay  constant. °DIAGNOSIS °Lab tests or other studies may be needed to find the cause of your pain. Your health care provider may have you take a test called an ambulatory ECG (electrocardiogram). An ECG records your heartbeat patterns at the time the test is performed. You may also have other tests, such as: °· Transthoracic echocardiogram (TTE). During echocardiography, sound waves are used to create a picture of all of the heart structures and to look at how blood flows through your heart. °· Transesophageal echocardiogram (TEE). This is a more advanced imaging test that obtains images from inside your body. It allows your health care provider to see your heart in finer detail. °· Cardiac monitoring. This allows your health care provider to monitor your heart rate and rhythm in real time. °· Holter monitor. This is a portable device that records your heartbeat and can help to diagnose abnormal heartbeats. It allows your health care provider to track your heart activity for several days, if needed. °· Stress tests. These can be done through exercise or by taking medicine that makes your heart beat more quickly. °· Blood tests. °· Imaging tests. °TREATMENT  °Your treatment depends on what is causing your chest pain. Treatment may include: °· Medicines. These may include: °¨ Acid blockers for heartburn. °¨ Anti-inflammatory medicine. °¨ Pain medicine for inflammatory conditions. °¨ Antibiotic medicine, if an infection is present. °¨ Medicines to dissolve blood clots. °¨ Medicines to treat coronary artery disease. °· Supportive care for conditions that do not require medicines. This may include: °¨ Resting. °¨ Applying heat   or cold packs to injured areas. °¨ Limiting activities until pain decreases. °HOME CARE INSTRUCTIONS °· If you were prescribed an antibiotic medicine, finish it all even if you start to feel better. °· Avoid any activities that bring on chest pain. °· Do not use any tobacco products, including  cigarettes, chewing tobacco, or electronic cigarettes. If you need help quitting, ask your health care provider. °· Do not drink alcohol. °· Take medicines only as directed by your health care provider. °· Keep all follow-up visits as directed by your health care provider. This is important. This includes any further testing if your chest pain does not go away. °· If heartburn is the cause for your chest pain, you may be told to keep your head raised (elevated) while sleeping. This reduces the chance that acid will go from your stomach into your esophagus. °· Make lifestyle changes as directed by your health care provider. These may include: °¨ Getting regular exercise. Ask your health care provider to suggest some activities that are safe for you. °¨ Eating a heart-healthy diet. A registered dietitian can help you to learn healthy eating options. °¨ Maintaining a healthy weight. °¨ Managing diabetes, if necessary. °¨ Reducing stress. °SEEK MEDICAL CARE IF: °· Your chest pain does not go away after treatment. °· You have a rash with blisters on your chest. °· You have a fever. °SEEK IMMEDIATE MEDICAL CARE IF:  °· Your chest pain is worse. °· You have an increasing cough, or you cough up blood. °· You have severe abdominal pain. °· You have severe weakness. °· You faint. °· You have chills. °· You have sudden, unexplained chest discomfort. °· You have sudden, unexplained discomfort in your arms, back, neck, or jaw. °· You have shortness of breath at any time. °· You suddenly start to sweat, or your skin gets clammy. °· You feel nauseous or you vomit. °· You suddenly feel light-headed or dizzy. °· Your heart begins to beat quickly, or it feels like it is skipping beats. °These symptoms may represent a serious problem that is an emergency. Do not wait to see if the symptoms will go away. Get medical help right away. Call your local emergency services (911 in the [place] Do not drive yourself to the hospital. °  °This  information is not intended to replace advice given to you by your health care provider. Make sure you discuss any questions you have with your health care provider. °  °Document Released: 09/20/2005 Document Revised: 01/01/2015 Document Reviewed: 07/17/2014 °Elsevier Interactive Patient Education ©2016 [employer] ° °

## 2015-11-24 NOTE — ED Notes (Signed)
Per EMS- pt began having a headache, chest pain, and facial numbness starting today. Pt reports headache upon arrival with facial numbness that has moved from her rt side to left side and now is in her chin.

## 2015-11-24 NOTE — Progress Notes (Signed)
Pt is a resident of [facility].

## 2015-11-24 NOTE — ED Provider Notes (Signed)
CSN: [id number]     Arrival date & time 11/24/15  1347 History   First MD Initiated Contact with Patient 11/24/15 1351     Chief Complaint  Patient presents with  . Headache     (Consider location/radiation/quality/duration/timing/severity/associated sxs/prior Treatment) Patient is a 81 y.o. female presenting with headaches.  Headache Associated symptoms: no abdominal pain, no back pain, no diarrhea, no eye pain, no nausea, no neck stiffness, no numbness, no vomiting and no weakness    patient presents with chest pain. States she has had some sharp chest pain with 2 episodes. Both resolved on their own. Lasted up to 5 minutes. Also has a dull headache as K some dull pain in her right jaw. No fevers. No cough. States she feels well otherwise. No nausea vomiting. No dysuria. Has a history of atrial fib and a pacemaker but no known history of coronary artery disease. Patient reportedly had hands over a little clammy.  Past Medical History  Diagnosis Date  . Stroke [facility])     TIA  . CHF (congestive heart failure) (HCC)   . Atrial fibrillation (HCC)   . Hypertension   . Thyroid disease    Past Surgical History  Procedure Laterality Date  . Pacemaker insertion     No family history on file. Social History  Substance Use Topics  . Smoking status: Never Smoker   . Smokeless tobacco: None  . Alcohol Use: None   OB History    No data available     Review of Systems  Constitutional: Negative for activity change and appetite change.  Eyes: Negative for pain.  Respiratory: Negative for chest tightness and shortness of breath.   Cardiovascular: Positive for chest pain. Negative for leg swelling.  Gastrointestinal: Negative for nausea, vomiting, abdominal pain and diarrhea.  Genitourinary: Negative for flank pain.  Musculoskeletal: Negative for back pain and neck stiffness.  Skin: Negative for rash.  Neurological: Positive for headaches. Negative for weakness and numbness.   Psychiatric/Behavioral: Negative for behavioral problems.      Allergies  Codeine; Levaquin; Xylocaine; and Loratadine  Home Medications   Prior to Admission medications   Medication Sig Start Date End Date Taking? Authorizing Provider  amiodarone (PACERONE) 200 MG tablet Take 200 mg by mouth daily.   Yes Historical Provider, MD  apixaban (ELIQUIS) 2.5 MG TABS tablet Take 2.5 mg by mouth 2 (two) times daily.   Yes Historical Provider, MD  b complex vitamins tablet Take 0.5 tablets by mouth 3 (three) times daily.   Yes Historical Provider, MD  beta carotene 82956 UNIT capsule Take 25,000 Units by mouth daily.   Yes Historical Provider, MD  calcium carbonate (OSCAL) 1500 (600 CA) MG TABS tablet Take 1,500 mg by mouth daily with breakfast.   Yes Historical Provider, MD  Cholecalciferol (VITAMIN D-3 PO) Take by mouth.   Yes Historical Provider, MD  CHROMIUM PO Take 100 mcg by mouth daily.   Yes Historical Provider, MD  fexofenadine (ALLEGRA) 180 MG tablet Take 180 mg by mouth daily.   Yes Historical Provider, MD  levothyroxine (SYNTHROID, LEVOTHROID) 88 MCG tablet Take 88 mcg by mouth daily before breakfast.   Yes Historical Provider, MD  magnesium oxide (MAG-OX) 400 MG tablet Take 400 mg by mouth daily.   Yes Historical Provider, MD  Multiple Vitamin (MULTIVITAMIN WITH MINERALS) TABS tablet Take 1 tablet by mouth daily.   Yes Historical Provider, MD  POTASSIUM PO Take 1 tablet by mouth daily.   Yes Historical  Provider, MD  pregabalin (LYRICA) 25 MG capsule Take 25 mg by mouth daily.   Yes Historical Provider, MD  selenium 50 MCG TABS tablet Take 50 mcg by mouth daily.   Yes Historical Provider, MD  torsemide (DEMADEX) 20 MG tablet Take 5 mg by mouth daily as needed. For edema   Yes Historical Provider, MD  VITAMIN A PO Take 1 tablet by mouth daily.    Yes Historical Provider, MD  vitamin B-12 (CYANOCOBALAMIN) 1000 MCG tablet Take 1,000 mcg by mouth daily.   Yes Historical Provider, MD   vitamin C (ASCORBIC ACID) 500 MG tablet Take 500 mg by mouth daily.   Yes Historical Provider, MD  vitamin E 400 UNIT capsule Take 400 Units by mouth daily.   Yes Historical Provider, MD  Zinc Sulfate (ZINC 15 PO) Take 1 tablet by mouth daily.    Yes Historical Provider, MD   BP 149/46 mmHg  Pulse 61  Temp(Src) 98.3 F (36.8 C) (Oral)  Resp 13  SpO2 97% Physical Exam  Constitutional: She is oriented to person, place, and time. She appears well-developed and well-nourished.  HENT:  Head: Normocephalic and atraumatic.  Eyes: Pupils are equal, round, and reactive to light.  Neck: Normal range of motion.  Cardiovascular: Normal rate, regular rhythm and normal heart sounds.   No murmur heard. Pulmonary/Chest: Effort normal and breath sounds normal. No respiratory distress. She has no wheezes. She has no rales.  Pacemaker to left upper chest wall.  Abdominal: Soft. Bowel sounds are normal. She exhibits no distension. There is no tenderness.  Musculoskeletal: Normal range of motion.  Neurological: She is alert and oriented to person, place, and time. No cranial nerve deficit.  Skin: Skin is warm and dry.  Psychiatric: She has a normal mood and affect. Her speech is normal.  Nursing note and vitals reviewed.   ED Course  Procedures (including critical care time) Labs Review Labs Reviewed  CBC WITH DIFFERENTIAL/PLATELET - Abnormal; Notable for the following:    RBC 3.73 (*)    Hemoglobin 11.9 (*)    HCT 35.3 (*)    All other components within normal limits  I-STAT CHEM 8, ED - Abnormal; Notable for the following:    Sodium 133 (*)    Chloride 97 (*)    Glucose, Bld 143 (*)    Calcium, Ion 1.03 (*)    All other components within normal limits  I-STAT TROPOININ, ED    Imaging Review Dg Chest 2 View  11/24/2015  CLINICAL DATA:  Chest pain for 1 day EXAM: CHEST  2 VIEW COMPARISON:  None. FINDINGS: There is mild scarring in each mid lung region. There is no edema or  consolidation. Heart is borderline enlarged with pulmonary vascularity within normal limits. There is calcification in the mitral annulus. Pacemaker leads are attached to the right atrium and middle cardiac vein. No adenopathy. No pneumothorax. No bone lesions. There are foci of calcification in each carotid artery. IMPRESSION: No edema or consolidation. Mild scarring in each mid lung region. Heart borderline enlarged. Pacemaker leads as described. Scattered foci of carotid artery calcification bilaterally. Electronically Signed   By: Bretta Bang III M.D.   On: 11/24/2015 15:12   I have personally reviewed and evaluated these images and lab results as part of my medical decision-making.   EKG Interpretation   Date/Time:  Wednesday November 24 2015 13:53:05 EST Ventricular Rate:  65 PR Interval:  233 QRS Duration: 118 QT Interval:  466 QTC Calculation:  485 R Axis:   133 Text Interpretation:  ATRIAL PACED RHYTHM Prolonged PR interval  Nonspecific intraventricular conduction delay Probable inferior infarct,  age indeterminate Reconfirmed by [facility]  MD, Wyland Rastetter [phone]([id number]) on  11/24/2015 3:42:33 PM      MDM   Final diagnoses:  Chest pain, unspecified chest pain type    Patient with various complaints. Dull occasional headache with some right-sided head tightness. Right jaw strange feeling. Sharp chest pain. EKG is paced. Pending lab work at this time. No previous history of known coronary artery disease, but has not had a previous heart catheterization.  Patient's lab work returned and is reassuring. Discussed with patient and her daughter, who is physician, Glennon HamiltonWendy McNiel. Will discharge home and patient has follow-up in 2 days with PCP.  Benjiman CoreNathan Quention Mcneill, MD 11/24/15 [phone]

## 2015-11-25 ENCOUNTER — Telehealth: Payer: Self-pay | Admitting: Internal Medicine

## 2015-11-25 NOTE — Telephone Encounter (Signed)
Sally/ Megan,  [facility]. She is pending surgical removal of 2 teeth under local anesthesia.  Dr. Mardene SpeakKain is requesting the patient hold Eliquis for 2 days prior to surgery and then resume immediately post-op. Is this ok?  Thanks!

## 2015-11-25 NOTE — Telephone Encounter (Signed)
Would prefer to hold Eliquis for 1 day prior to 2 dental extractions if possible since patient has CHADS2 score of 4 including prior TIA. Restart 24 hours after procedure assuming no complications.

## 2015-11-26 ENCOUNTER — Encounter: Payer: Self-pay | Admitting: Internal Medicine

## 2015-11-26 NOTE — Telephone Encounter (Signed)
Faxed copy of phone note to Dr. Mardene SpeakKain at [phone]. Confirmation received.

## 2015-11-29 ENCOUNTER — Encounter: Payer: Self-pay | Admitting: Nurse Practitioner

## 2015-11-29 ENCOUNTER — Ambulatory Visit (INDEPENDENT_AMBULATORY_CARE_PROVIDER_SITE_OTHER): Payer: Medicare PPO | Admitting: Nurse Practitioner

## 2015-11-29 ENCOUNTER — Telehealth: Payer: Self-pay | Admitting: Interventional Cardiology

## 2015-11-29 VITALS — BP 180/80 | HR 70 | Ht <= 58 in | Wt 95.0 lb

## 2015-11-29 DIAGNOSIS — I480 Paroxysmal atrial fibrillation: Secondary | ICD-10-CM

## 2015-11-29 MED ORDER — TORSEMIDE 20 MG PO TABS: ORAL_TABLET | ORAL | Status: DC

## 2015-11-29 NOTE — Progress Notes (Signed)
CARDIOLOGY OFFICE NOTE  Date:  11/29/2015    Melanie Obrien Date of Birth: June 02, 1919 Medical Record #[id number]  PCP:  Gaye Alken, MD  Cardiologist:  Eldridge Dace.     Chief Complaint  Patient presents with  . Hypertension    Work in visit. Seen for Dr. Eldridge Dace    History of Present Illness: Melanie Obrien is a 81 y.o. female who presents today for a work in visit. Seen for Dr. Eldridge Dace. She has PAF and diastolic HF. Had underlying loop recorder that has been changed out for BiV pacer - followed by Dr. Graciela Husbands. She is on amiodarone and Eliquis.   Other issues include TIA, trigeminal neuralgia and advanced age.   Last seen in February by Dr. Eldridge Dace. Saw Dr. Graciela Husbands back in September.   Phone call today - BP high and had chest pain last week - went to [facility] ED. Followed up with Dr. Zachery Dauer on Friday.  Appointment made here for today. She is the mother of Dr. [name].  Comes in today. Here with her daughter [name].  She has had BP elevation over the past few weeks. This would coincide with her cutting back her diuretic. Her weight has been going down. She was in the ER last week - nothing really done. Saw PCP on Friday. Labs on Friday looked good. TSH now in range - first time in one year. No more chest pain. Breathing is ok. Does sit most of the day with her legs down.    Past Medical History  Diagnosis Date  . Sinus bradycardia   . TIA (transient ischemic attack) 2000's X 1  . Trigeminal neuralgia of left side of face 12/13/2014  . Acute on chronic diastolic heart failure (HCC)   . Paroxysmal atrial fibrillation (HCC)   . Unstable gait    . Idiopathic scoliosis    . Gluten intolerance    . Complication of anesthesia     "30 minutes violent shaking after a dental procedure"   . Heart murmur   . Pneumonia 2011; 11/2014  . Pleural effusion 2011    "hugh w/pneumonia"  . Hypothyroidism     "from the aminodarone"  . Degenerative disc disease, lumbar    .  Arthritis     "plenty" (06/17/2015)  . Chronic back pain   . AICD (automatic cardioverter/defibrillator) present   . Stroke [facility])     TIA  . CHF (congestive heart failure) (HCC)   . Atrial fibrillation (HCC)   . Hypertension   . Thyroid disease     Past Surgical History  Procedure Laterality Date  . Tonsillectomy  1944  . Toenail avulsion Left     2nd digit  . Loop recorder implant  08/2013    explanted 06/17/2015  . Cataract extraction w/ intraocular lens  implant, bilateral Bilateral 1991-1992  . Bi-ventricular implantable cardioverter defibrillator  (crt-d)  06/17/2015  . Ep implantable device N/A 06/17/2015    Procedure: Pacemaker Implant;  Surgeon: Duke Salvia, MD;  Location: [facility] INVASIVE CV LAB;  Service: Cardiovascular;  Laterality: N/A;  . Pacemaker insertion       Medications: Current Outpatient Prescriptions  Medication Sig Dispense Refill  . amiodarone (PACERONE) 200 MG tablet Take 200 mg by mouth daily.    Marland Kitchen apixaban (ELIQUIS) 2.5 MG TABS tablet Take 1 tablet (2.5 mg total) by mouth 2 (two) times daily. 60 tablet 5  . b complex vitamins tablet Take 0.5 tablets by mouth 3 (  three) times daily.     . calcium carbonate (OSCAL) 1500 (600 CA) MG TABS tablet Take 1,500 mg by mouth daily with breakfast.    . Cholecalciferol (VITAMIN D3) 5000 UNITS TABS Take 1 tablet by mouth daily.     . CHROMIUM PO Take 100 mcg by mouth daily.    . fexofenadine (ALLEGRA) 180 MG tablet Take 1 tablet (180 mg total) by mouth daily. 30 tablet 3  . levothyroxine (SYNTHROID, LEVOTHROID) 88 MCG tablet Take 88 mcg by mouth daily before breakfast.    . magnesium oxide (MAG-OX) 400 MG tablet Take 400 mg by mouth daily.    . Multiple Vitamin (MULTIVITAMIN WITH MINERALS) TABS tablet Take 1 tablet by mouth daily.    Marland Kitchen POTASSIUM PO Take 1 tablet by mouth daily.    . pregabalin (LYRICA) 25 MG capsule Take 25 mg by mouth 2 (two) times daily.     . progesterone (PROMETRIUM) 100 MG capsule Take 100 mg by  mouth every other day.     . selenium 50 MCG TABS tablet Take 50 mcg by mouth daily.    Marland Kitchen torsemide (DEMADEX) 20 MG tablet Take 1/2 tablet daily 45 tablet 3  . VITAMIN A PO Take 1 tablet by mouth daily.     . vitamin B-12 (CYANOCOBALAMIN) 1000 MCG tablet Take 1,000 mcg by mouth daily.    . vitamin C (ASCORBIC ACID) 500 MG tablet Take 500 mg by mouth daily.    . vitamin E 400 UNIT capsule Take 400 Units by mouth daily.    . Zinc Sulfate (ZINC 15 PO) Take 1 tablet by mouth daily.     . beta carotene 16109 UNIT capsule Take 25,000 Units by mouth daily.    Marland Kitchen estradiol (ESTRACE) 1 MG tablet Take 0.5 mg by mouth daily.    Marland Kitchen levothyroxine (SYNTHROID, LEVOTHROID) 75 MCG tablet Take 75 mcg by mouth daily before breakfast.     No current facility-administered medications for this visit.    Allergies: Allergies  Allergen Reactions  . Codeine Other (See Comments)    unknown  . Codeine Nausea And Vomiting    & headaches  . Levaquin [Levofloxacin In D5w] Itching  . Levaquin [Levofloxacin In D5w] Swelling    & redness  . Loratadine     TACHYCARDIA  . Xylocaine [Lidocaine Hcl] Other (See Comments)    Uncontrolled shaking  . Xylocaine [Lidocaine Hcl] Other (See Comments)    Made her very cold & lots of shaking. Pt and daughter stated that she can take it.  . Loratadine Palpitations    Social History: The patient  reports that she has never smoked. She does not have any smokeless tobacco history on file. She reports that she does not drink alcohol or use illicit drugs.   Family History: The patient's family history includes Hypertension in her father.   Review of Systems: Please see the history of present illness.   Otherwise, the review of systems is positive for none.   All other systems are reviewed and negative.   Physical Exam: VS:  BP 180/80 mmHg  Pulse 70  Ht  (1.397 m)  Wt 95 lb (43.092 kg)  BMI 22.08 kg/m2 .  BMI Body mass index is 22.08 kg/(m^2).  Wt Readings from Last  3 Encounters:  11/29/15 95 lb (43.092 kg)  09/24/15 96 lb (43.545 kg)  06/18/15 96 lb 1.9 oz (43.6 kg)    General: Pleasant elderly female who is alert and in no  acute distress.  HEENT: Normal.Eyes maybe a little icteric.  Neck: Supple, no JVD, carotid bruits, or masses noted.  Cardiac: Regular rate and rhythm. Harsh outflow murmur noted. +2 edema.   Respiratory:  Lungs are clear to auscultation bilaterally with normal work of breathing.  GI: Soft and nontender.  MS: No deformity or atrophy. Gait and ROM intact. Skin: Warm and dry. Color is normal.  Neuro:  Strength and sensation are intact and no gross focal deficits noted.  Psych: Alert, appropriate and with normal affect. She moves slowly.    LABORATORY DATA:  EKG:  EKG is not ordered today.  Lab Results  Component Value Date   WBC 8.0 11/24/2015   HGB 12.9 11/24/2015   HCT 38.0 11/24/2015   PLT 204 11/24/2015   GLUCOSE 143* 11/24/2015   ALT 40* 01/22/2015   AST 47* 01/22/2015   NA 133* 11/24/2015   K 4.2 11/24/2015   CL 97* 11/24/2015   CREATININE 0.60 11/24/2015   BUN 17 11/24/2015   CO2 31 06/11/2015   TSH 9.513* 06/10/2015    BNP (last 3 results)  Recent Labs  01/18/15 1717 06/09/15 1534  BNP 526.6* 369.6*    ProBNP (last 3 results)  Recent Labs  12/13/14 1405 12/30/14 1619 02/04/15 1446  PROBNP 2339.0* 395.0* 330.0*     Other Studies Reviewed Today:  Echo Study Conclusions 11/2014  - Left ventricle: The cavity size was normal. Wall thickness was normal. Systolic function was normal. The estimated ejection fraction was in the range of 55% to 60%. Wall motion was normal; there were no regional wall motion abnormalities. Doppler parameters are consistent with high ventricular filling pressure. - Aortic valve: There was mild regurgitation. - Mitral valve: Severely calcified annulus. Mildly thickened leaflets . There was mild regurgitation. - Left atrium: The atrium was severely  dilated. - Pulmonary arteries: Systolic pressure was moderately increased. PA peak pressure: 46 mm Hg (S). - Pericardium, extracardiac: A trivial pericardial effusion was identified. There was a left pleural effusion.  Impressions:  - Normal LV function; elevated left ventricular filling pressures; severe LAE; mild AI and MR; moderately elevated pulmonary pressure.   Assessment/Plan: 1. HTN  2. Chronic diastolic heart failure  3. Underlying PPM  Her recent labs are ok. She has cut her dose of diuretic back over the past few weeks - this correlates with recent symptoms - would increase demedex back to just 10 mg EVERY day. Monitor BP at home. Call in one week with an update. Would hold on further testing for now. Will get her follow up with Dr. Eldridge DaceVaranasi in one month.   Current medicines are reviewed with the patient today.  The patient does not have concerns regarding medicines other than what has been noted above.  The following changes have been made:  See above.  Labs/ tests ordered today include:   No orders of the defined types were placed in this encounter.     Disposition:   FU with Dr. Eldridge DaceVaranasi in one month.  Patient is agreeable to this plan and will call if any problems develop in the interim.   Signed: Rosalio MacadamiaLori C. Samayra Hebel, RN, ANP-C 11/29/2015 3:38 PM  [facility] [address] [place], [place]  [address] Phone: [phone] Fax: [phone]

## 2015-11-29 NOTE — Telephone Encounter (Signed)
**Note De-Identified  Obfuscation** The pt has been scheduled to see Norma FredricksonLori Gerhardt, NP today at 2:30. Dr Uvaldo RisingMcNeil is aware and agrees with appt date and time.

## 2015-11-29 NOTE — Patient Instructions (Addendum)
We will be checking the following labs today - NONE   Medication Instructions:    Continue with your current medicines. BUT  I am increasing the Torsemide to a half a tablet each day     Testing/Procedures To Be Arranged:  N/A  Follow-Up:   See Dr. Eldridge DaceVaranasi as planned  Call Larita FifeLynn his nurse in one week with a BP update.     Other Special Instructions:   Monitor your blood pressure at home - different times of day - keep a diary    If you need a refill on your cardiac medications before your next appointment, please call your pharmacy.   Call the [facility] office at [phone] if you have any questions, problems or concerns.

## 2015-11-29 NOTE — Telephone Encounter (Signed)
Pt c/o BP issue: STAT if pt c/o blurred vision, one-sided weakness or slurred speech  1. What are your last 5 BP readings? 175-188 systolic this am- 11/29/15  2. Are you having any other symptoms (ex. Dizziness, headache, blurred vision, passed out)? [facility]  3. What is your BP issue? High BP

## 2015-12-06 ENCOUNTER — Telehealth: Payer: Self-pay | Admitting: Interventional Cardiology

## 2015-12-06 NOTE — Telephone Encounter (Signed)
S/w Dr. Uvaldo RisingMcneil pt's mother bp upper 160/170's HR 70-90 which was stated was high for pt.  Weight was stable 93-94 lbs.  LE Edema Dr. Uvaldo RisingMcneil is going to bump up pt's torsemide tomorrow to ( 20 mg ) daily.  Will [facility]

## 2015-12-07 ENCOUNTER — Other Ambulatory Visit: Payer: Self-pay | Admitting: *Deleted

## 2015-12-07 MED ORDER — LISINOPRIL 5 MG PO TABS: 5.0000 mg | ORAL_TABLET | Freq: Every day | ORAL | Status: DC

## 2015-12-07 NOTE — Telephone Encounter (Signed)
Please see Dr. Hoyle BarrVaranasi's recommendation to start Lisinopril 5 mg a day with BMET in one week.

## 2015-12-07 NOTE — Telephone Encounter (Signed)
Dr. Eldridge DaceVaranasi,  Do you have any suggestions??

## 2015-12-07 NOTE — Telephone Encounter (Signed)
Start lisinopril 5 mg daily.  BMet in one week.  OK to do at PMD office if more convenient.  [facility]

## 2015-12-07 NOTE — Telephone Encounter (Signed)
S/w pt's daughter Dr. Uvaldo RisingMcneil, is agreeable to plan. Will start lisinopril ( 5 mg ) daily sent into pt's requested pharmacy.  Will get bmet in one week at PCP office and will fax results over to Dr. Eldridge DaceVaranasi.

## 2015-12-28 ENCOUNTER — Ambulatory Visit (INDEPENDENT_AMBULATORY_CARE_PROVIDER_SITE_OTHER): Payer: Medicare PPO | Admitting: *Deleted

## 2015-12-28 DIAGNOSIS — I495 Sick sinus syndrome: Secondary | ICD-10-CM

## 2015-12-29 NOTE — Progress Notes (Signed)
Remote pacemaker transmission.   

## 2016-01-03 ENCOUNTER — Ambulatory Visit: Payer: Medicare PPO | Admitting: Interventional Cardiology

## 2016-01-05 ENCOUNTER — Emergency Department (HOSPITAL_COMMUNITY)
Admission: EM | Admit: 2016-01-05 | Discharge: 2016-01-05 | Disposition: A | Discharge status: Home / Self Care | Payer: Medicare PPO | Attending: Emergency Medicine | Admitting: Emergency Medicine

## 2016-01-05 ENCOUNTER — Encounter (HOSPITAL_COMMUNITY): Payer: Self-pay | Admitting: *Deleted

## 2016-01-05 ENCOUNTER — Emergency Department (HOSPITAL_COMMUNITY): Payer: Medicare PPO

## 2016-01-05 DIAGNOSIS — Y9301 Activity, walking, marching and hiking: Secondary | ICD-10-CM | POA: Insufficient documentation

## 2016-01-05 DIAGNOSIS — Y9289 Other specified places as the place of occurrence of the external cause: Secondary | ICD-10-CM | POA: Insufficient documentation

## 2016-01-05 DIAGNOSIS — I509 Heart failure, unspecified: Secondary | ICD-10-CM | POA: Insufficient documentation

## 2016-01-05 DIAGNOSIS — Y998 Other external cause status: Secondary | ICD-10-CM | POA: Diagnosis not present

## 2016-01-05 DIAGNOSIS — I10 Essential (primary) hypertension: Secondary | ICD-10-CM | POA: Diagnosis not present

## 2016-01-05 DIAGNOSIS — Z8701 Personal history of pneumonia (recurrent): Secondary | ICD-10-CM | POA: Insufficient documentation

## 2016-01-05 DIAGNOSIS — R011 Cardiac murmur, unspecified: Secondary | ICD-10-CM | POA: Insufficient documentation

## 2016-01-05 DIAGNOSIS — Z79818 Long term (current) use of other agents affecting estrogen receptors and estrogen levels: Secondary | ICD-10-CM | POA: Diagnosis not present

## 2016-01-05 DIAGNOSIS — Z95810 Presence of automatic (implantable) cardiac defibrillator: Secondary | ICD-10-CM | POA: Insufficient documentation

## 2016-01-05 DIAGNOSIS — G8929 Other chronic pain: Secondary | ICD-10-CM | POA: Insufficient documentation

## 2016-01-05 DIAGNOSIS — I5033 Acute on chronic diastolic (congestive) heart failure: Secondary | ICD-10-CM | POA: Insufficient documentation

## 2016-01-05 DIAGNOSIS — Z8673 Personal history of transient ischemic attack (TIA), and cerebral infarction without residual deficits: Secondary | ICD-10-CM | POA: Insufficient documentation

## 2016-01-05 DIAGNOSIS — I480 Paroxysmal atrial fibrillation: Secondary | ICD-10-CM | POA: Diagnosis not present

## 2016-01-05 DIAGNOSIS — Z79899 Other long term (current) drug therapy: Secondary | ICD-10-CM | POA: Insufficient documentation

## 2016-01-05 DIAGNOSIS — S300XXA Contusion of lower back and pelvis, initial encounter: Secondary | ICD-10-CM | POA: Diagnosis not present

## 2016-01-05 DIAGNOSIS — M4120 Other idiopathic scoliosis, site unspecified: Secondary | ICD-10-CM | POA: Diagnosis not present

## 2016-01-05 DIAGNOSIS — Z8709 Personal history of other diseases of the respiratory system: Secondary | ICD-10-CM | POA: Diagnosis not present

## 2016-01-05 DIAGNOSIS — W1839XA Other fall on same level, initial encounter: Secondary | ICD-10-CM | POA: Diagnosis not present

## 2016-01-05 DIAGNOSIS — M1990 Unspecified osteoarthritis, unspecified site: Secondary | ICD-10-CM | POA: Insufficient documentation

## 2016-01-05 DIAGNOSIS — Z7902 Long term (current) use of antithrombotics/antiplatelets: Secondary | ICD-10-CM | POA: Diagnosis not present

## 2016-01-05 DIAGNOSIS — S3993XA Unspecified injury of pelvis, initial encounter: Secondary | ICD-10-CM | POA: Diagnosis present

## 2016-01-05 DIAGNOSIS — E039 Hypothyroidism, unspecified: Secondary | ICD-10-CM | POA: Insufficient documentation

## 2016-01-05 LAB — CUP PACEART REMOTE DEVICE CHECK
Battery Remaining Longevity: 95 mo
Battery Remaining Percentage: 95.5 %
Brady Statistic AP VS Percent: 81 %
Brady Statistic RA Percent Paced: 83 %
Date Time Interrogation Session: 20170103070013
Implantable Lead Implant Date: 20160623
Implantable Lead Location: 753860
Implantable Lead Model: 1944
Implantable Lead Model: 1948
Lead Channel Impedance Value: 530 Ohm
Lead Channel Impedance Value: 640 Ohm
Lead Channel Pacing Threshold Amplitude: 0.75 V
Lead Channel Pacing Threshold Amplitude: 0.75 V
Lead Channel Pacing Threshold Pulse Width: 1 ms
Lead Channel Sensing Intrinsic Amplitude: 0.4 mV
MDC IDC LEAD IMPLANT DT: 20160623
MDC IDC LEAD LOCATION: 753859
MDC IDC MSMT BATTERY VOLTAGE: 3.01 V
MDC IDC MSMT LEADCHNL RV PACING THRESHOLD PULSEWIDTH: 0.4 ms
MDC IDC MSMT LEADCHNL RV SENSING INTR AMPL: 10 mV
MDC IDC SET LEADCHNL RA PACING AMPLITUDE: 2.5 V
MDC IDC SET LEADCHNL RV PACING AMPLITUDE: 2.5 V
MDC IDC SET LEADCHNL RV PACING PULSEWIDTH: 0.4 ms
MDC IDC SET LEADCHNL RV SENSING SENSITIVITY: 2 mV
MDC IDC STAT BRADY AP VP PERCENT: 4 %
MDC IDC STAT BRADY AS VP PERCENT: 5.7 %
MDC IDC STAT BRADY AS VS PERCENT: 9.6 %
MDC IDC STAT BRADY RV PERCENT PACED: 9.6 %
Pulse Gen Model: 2240
Pulse Gen Serial Number: 7779640

## 2016-01-05 NOTE — ED Provider Notes (Signed)
CSN: [id number]     Arrival date & time 01/05/16  1845 History   First MD Initiated Contact with Patient 01/05/16 1949     Chief Complaint  Patient presents with  . Hip Pain     (Consider location/radiation/quality/duration/timing/severity/associated sxs/prior Treatment) HPI   Melanie Obrien is a 82 y.o. female who presents for evaluation of right pelvic pain after a fall, when she became unbalanced while walking, 5 days ago. Initially, she did fine but has had gradually worsening pain, and today became unable to walk secondary to the pain. She had an x-ray done at a clinic today, but it was nondiagnostic. She was therefore sent here for further evaluation. There's been no recent fever, chills, nausea, vomiting, change in bowel or urinary habits. There are no other known modifying factors.   Past Medical History  Diagnosis Date  . Sinus bradycardia   . TIA (transient ischemic attack) 2000's X 1  . Trigeminal neuralgia of left side of face 12/13/2014  . Acute on chronic diastolic heart failure (HCC)   . Paroxysmal atrial fibrillation (HCC)   . Unstable gait    . Idiopathic scoliosis    . Gluten intolerance    . Complication of anesthesia     "30 minutes violent shaking after a dental procedure"   . Heart murmur   . Pneumonia 2011; 11/2014  . Pleural effusion 2011    "hugh w/pneumonia"  . Hypothyroidism     "from the aminodarone"  . Degenerative disc disease, lumbar    . Arthritis     "plenty" (06/17/2015)  . Chronic back pain   . AICD (automatic cardioverter/defibrillator) present   . Stroke [facility])     TIA  . CHF (congestive heart failure) (HCC)   . Atrial fibrillation (HCC)   . Hypertension   . Thyroid disease    Past Surgical History  Procedure Laterality Date  . Tonsillectomy  1944  . Toenail avulsion Left     2nd digit  . Loop recorder implant  08/2013    explanted 06/17/2015  . Cataract extraction w/ intraocular lens  implant, bilateral Bilateral 1991-1992  .  Bi-ventricular implantable cardioverter defibrillator  (crt-d)  06/17/2015  . Ep implantable device N/A 06/17/2015    Procedure: Pacemaker Implant;  Surgeon: Duke Salvia, MD;  Location: [facility] INVASIVE CV LAB;  Service: Cardiovascular;  Laterality: N/A;  . Pacemaker insertion     Family History  Problem Relation Age of Onset  . Hypertension Father    Social History  Substance Use Topics  . Smoking status: Never Smoker   . Smokeless tobacco: None  . Alcohol Use: No   OB History    Gravida Para Term Preterm AB TAB SAB Ectopic Multiple Living   0 0 0 0 0 0 0 0       Review of Systems  All other systems reviewed and are negative.     Allergies  Codeine; Codeine; Levaquin; Levaquin; Loratadine; Xylocaine; Xylocaine; and Loratadine  Home Medications   Prior to Admission medications   Medication Sig Start Date End Date Taking? Authorizing Provider  amiodarone (PACERONE) 200 MG tablet Take 200 mg by mouth daily.    Historical Provider, MD  apixaban (ELIQUIS) 2.5 MG TABS tablet Take 1 tablet (2.5 mg total) by mouth 2 (two) times daily. 07/13/15   Duke Salvia, MD  b complex vitamins tablet Take 0.5 tablets by mouth 3 (three) times daily.     Historical Provider, MD  beta carotene  25000 UNIT capsule Take 25,000 Units by mouth daily.    Historical Provider, MD  calcium carbonate (OSCAL) 1500 (600 CA) MG TABS tablet Take 1,500 mg by mouth daily with breakfast.    Historical Provider, MD  Cholecalciferol (VITAMIN D3) 5000 UNITS TABS Take 1 tablet by mouth daily.     Historical Provider, MD  CHROMIUM PO Take 100 mcg by mouth daily.    Historical Provider, MD  estradiol (ESTRACE) 1 MG tablet Take 0.5 mg by mouth daily.    Historical Provider, MD  fexofenadine (ALLEGRA) 180 MG tablet Take 1 tablet (180 mg total) by mouth daily. 05/17/15   Kimber Relic, MD  levothyroxine (SYNTHROID, LEVOTHROID) 75 MCG tablet Take 75 mcg by mouth daily before breakfast.    Historical Provider, MD   levothyroxine (SYNTHROID, LEVOTHROID) 88 MCG tablet Take 88 mcg by mouth daily before breakfast.    Historical Provider, MD  lisinopril (PRINIVIL,ZESTRIL) 5 MG tablet Take 1 tablet (5 mg total) by mouth daily. 12/07/15   Corky Crafts, MD  magnesium oxide (MAG-OX) 400 MG tablet Take 400 mg by mouth daily.    Historical Provider, MD  Multiple Vitamin (MULTIVITAMIN WITH MINERALS) TABS tablet Take 1 tablet by mouth daily.    Historical Provider, MD  POTASSIUM PO Take 1 tablet by mouth daily.    Historical Provider, MD  pregabalin (LYRICA) 25 MG capsule Take 25 mg by mouth 2 (two) times daily.     Historical Provider, MD  progesterone (PROMETRIUM) 100 MG capsule Take 100 mg by mouth every other day.     Historical Provider, MD  selenium 50 MCG TABS tablet Take 50 mcg by mouth daily.    Historical Provider, MD  torsemide (DEMADEX) 20 MG tablet Take 1/2 tablet daily 11/29/15   Rosalio Macadamia, NP  VITAMIN A PO Take 1 tablet by mouth daily.     Historical Provider, MD  vitamin B-12 (CYANOCOBALAMIN) 1000 MCG tablet Take 1,000 mcg by mouth daily.    Historical Provider, MD  vitamin C (ASCORBIC ACID) 500 MG tablet Take 500 mg by mouth daily.    Historical Provider, MD  vitamin E 400 UNIT capsule Take 400 Units by mouth daily.    Historical Provider, MD  Zinc Sulfate (ZINC 15 PO) Take 1 tablet by mouth daily.     Historical Provider, MD   BP 164/52 mmHg  Pulse 66  Temp(Src) 98.7 F (37.1 C) (Oral)  Resp 18  Ht 4\' 7"  (1.397 m)  Wt 93 lb (42.185 kg)  BMI 21.62 kg/m2  SpO2 98% Physical Exam  Constitutional: She is oriented to person, place, and time. She appears well-developed.  Elderly, frail  HENT:  Head: Normocephalic and atraumatic.  Right Ear: External ear normal.  Left Ear: External ear normal.  Eyes: Conjunctivae and EOM are normal. Pupils are equal, round, and reactive to light.  Neck: Normal range of motion and phonation normal. Neck supple.  Cardiovascular: Normal rate, regular  rhythm and normal heart sounds.   Pulmonary/Chest: Effort normal and breath sounds normal. She exhibits no bony tenderness.  Musculoskeletal: Normal range of motion.  I am able to passively move the right hip without pain. There is tenderness in the right sacroiliac area, without instability of the pelvis. No contusion over the pelvis.  Neurological: She is alert and oriented to person, place, and time. No cranial nerve deficit or sensory deficit. She exhibits normal muscle tone. Coordination normal.  Skin: Skin is warm, dry and intact.  Psychiatric: She has a normal mood and affect. Her behavior is normal. Judgment and thought content normal.  Nursing note and vitals reviewed.   ED Course  Procedures (including critical care time)  Medications - No data to display  Patient Vitals for the past 24 hrs:  BP Temp Temp src Pulse Resp SpO2 Height Weight  01/05/16 1852 (!) 164/52 mmHg 98.7 F (37.1 C) Oral 66 18 98 % - -  01/05/16 1851 - - - - - - 4\' 7"  (1.397 m) 93 lb (42.185 kg)    9:58 PM Reevaluation with update and discussion. After initial assessment and treatment, an updated evaluation reveals no change in status. Findings discussed with patient and daughter, all questions were answered. Marko Skalski L    Labs Review Labs Reviewed - No data to display  Imaging Review No results found. I have personally reviewed and evaluated these images and lab results as part of my medical decision-making.   EKG Interpretation None      MDM   Final diagnoses:  Contusion of pelvis, initial encounter    Fall with pelvic contusion. No evidence for pelvic fracture. No evidence for hip or lower lumbar fracture.  Nursing Notes Reviewed/ Care Coordinated Applicable Imaging Reviewed Interpretation of Laboratory Data incorporated into ED treatment  The patient appears reasonably screened and/or stabilized for discharge and I doubt any other medical condition or other [facility] requiring further  screening, evaluation, or treatment in the ED at this time prior to discharge.  Plan: Home Medications- Tylenol; Home Treatments- heat; return here if the recommended treatment, does not improve the symptoms; Recommended follow up- PCP prn     Mancel BaleElliott Zoye Chandra, MD 01/05/16 2200

## 2016-01-05 NOTE — ED Notes (Addendum)
Pt reports having a fall on Saturday, initially felt ok and remained ambulatory. Having increase in right hip pain over past two days and difficulty ambulating. [facility] and [facility], could not rule out fracture and sent here for ct scan or MRI.

## 2016-01-05 NOTE — Discharge Instructions (Signed)
Use heat on the sore area 3 or 4 times a day. Take Tylenol every 4 hours as needed for pain.    Contusion A contusion is a deep bruise. Contusions are the result of a blunt injury to tissues and muscle fibers under the skin. The injury causes bleeding under the skin. The skin overlying the contusion may turn blue, purple, or yellow. Minor injuries will give you a painless contusion, but more severe contusions may stay painful and swollen for a few weeks.  CAUSES  This condition is usually caused by a blow, trauma, or direct force to an area of the body. SYMPTOMS  Symptoms of this condition include:  Swelling of the injured area.  Pain and tenderness in the injured area.  Discoloration. The area may have redness and then turn blue, purple, or yellow. DIAGNOSIS  This condition is diagnosed based on a physical exam and medical history. An X-ray, CT scan, or MRI may be needed to determine if there are any associated injuries, such as broken bones (fractures). TREATMENT  Specific treatment for this condition depends on what area of the body was injured. In general, the best treatment for a contusion is resting, icing, applying pressure to (compression), and elevating the injured area. This is often called the RICE strategy. Over-the-counter anti-inflammatory medicines may also be recommended for pain control.  HOME CARE INSTRUCTIONS   Rest the injured area.  If directed, apply ice to the injured area:  Put ice in a plastic bag.  Place a towel between your skin and the bag.  Leave the ice on for 20 minutes, 2-3 times per day.  If directed, apply light compression to the injured area using an elastic bandage. Make sure the bandage is not wrapped too tightly. Remove and reapply the bandage as directed by your health care provider.  If possible, raise (elevate) the injured area above the level of your heart while you are sitting or lying down.  Take over-the-counter and prescription  medicines only as told by your health care provider. SEEK MEDICAL CARE IF:  Your symptoms do not improve after several days of treatment.  Your symptoms get worse.  You have difficulty moving the injured area. SEEK IMMEDIATE MEDICAL CARE IF:   You have severe pain.  You have numbness in a hand or foot.  Your hand or foot turns pale or cold.   This information is not intended to replace advice given to you by your health care provider. Make sure you discuss any questions you have with your health care provider.   Document Released: 09/20/2005 Document Revised: 09/01/2015 Document Reviewed: 04/28/2015 Elsevier Interactive Patient Education [employer]2016 Elsevier Inc.

## 2016-01-09 ENCOUNTER — Encounter (HOSPITAL_COMMUNITY): Payer: Self-pay | Admitting: *Deleted

## 2016-01-09 ENCOUNTER — Emergency Department (INDEPENDENT_AMBULATORY_CARE_PROVIDER_SITE_OTHER)
Admission: EM | Admit: 2016-01-09 | Discharge: 2016-01-09 | Disposition: A | Discharge status: Home / Self Care | Payer: Medicare PPO | Source: Home / Self Care | Attending: Emergency Medicine | Admitting: Emergency Medicine

## 2016-01-09 DIAGNOSIS — M25559 Pain in unspecified hip: Secondary | ICD-10-CM

## 2016-01-09 DIAGNOSIS — R358 Other polyuria: Secondary | ICD-10-CM

## 2016-01-09 DIAGNOSIS — N179 Acute kidney failure, unspecified: Secondary | ICD-10-CM

## 2016-01-09 DIAGNOSIS — R3589 Other polyuria: Secondary | ICD-10-CM

## 2016-01-09 HISTORY — DX: Unspecified diastolic (congestive) heart failure: I50.30

## 2016-01-09 HISTORY — DX: Age-related osteoporosis without current pathological fracture: M81.0

## 2016-01-09 LAB — POCT URINALYSIS DIP (DEVICE)
BILIRUBIN URINE: NEGATIVE
BILIRUBIN URINE: NEGATIVE
Glucose, UA: NEGATIVE mg/dL
Glucose, UA: NEGATIVE mg/dL
HGB URINE DIPSTICK: NEGATIVE
KETONES UR: NEGATIVE mg/dL
Ketones, ur: NEGATIVE mg/dL
LEUKOCYTES UA: NEGATIVE
Leukocytes, UA: NEGATIVE
Nitrite: NEGATIVE
Nitrite: NEGATIVE
PH: 7.5 (ref 5.0–8.0)
Protein, ur: NEGATIVE mg/dL
Protein, ur: NEGATIVE mg/dL
SPECIFIC GRAVITY, URINE: 1.015 (ref 1.005–1.030)
Specific Gravity, Urine: 1.015 (ref 1.005–1.030)
Urobilinogen, UA: 0.2 mg/dL (ref 0.0–1.0)
Urobilinogen, UA: 0.2 mg/dL (ref 0.0–1.0)
pH: 7.5 (ref 5.0–8.0)

## 2016-01-09 LAB — BASIC METABOLIC PANEL
ANION GAP: 12 (ref 5–15)
BUN: 28 mg/dL — ABNORMAL HIGH (ref 6–20)
CO2: 31 mmol/L (ref 22–32)
Calcium: 8.8 mg/dL — ABNORMAL LOW (ref 8.9–10.3)
Chloride: 96 mmol/L — ABNORMAL LOW (ref 101–111)
Creatinine, Ser: 0.89 mg/dL (ref 0.44–1.00)
GFR calc Af Amer: >60 mL/min (ref >60)
GFR calc non Af Amer: 54 mL/min — ABNORMAL LOW (ref >60)
GLUCOSE: 109 mg/dL — AB (ref 65–99)
POTASSIUM: 4.1 mmol/L (ref 3.5–5.1)
Sodium: 139 mmol/L (ref 135–145)

## 2016-01-09 NOTE — ED Notes (Signed)
Updated on wait. Comfort measures offered. 

## 2016-01-09 NOTE — ED Notes (Signed)
Per family member: pt started voiding large amts urine 4 days ago; went to ED Wed noc for right hip/leg pain from a fall; Saw PCP 2 days ago - had elevated BUN/creatinine, thought to be dehydrated.  Last night started voiding large amts again & c/o foul odor to urine.  Continues with significant pain in right hip - needs assistance moving due to pain and decreased mobility.  Denies fevers.  Denies n/v.  Family member states they wish to have BMP & urine cx done.

## 2016-01-09 NOTE — ED Notes (Signed)
Note: pt had taken herself off torsemide; was restarted 3 wks ago.  Had 1/2 dose this AM.

## 2016-01-09 NOTE — ED Provider Notes (Signed)
CSN: [id number]     Arrival date & time 01/09/16  1311 History   First MD Initiated Contact with Patient 01/09/16 1433     Chief Complaint  Patient presents with  . Urinary Tract Infection  . Hip Pain   (Consider location/radiation/quality/duration/timing/severity/associated sxs/prior Treatment) HPI Pt is here with her daughter for evaluation of large urine volume over the last couple of days. Pt also had a fall and was seen 1/11 in ED with plain films being inconclusive of hip fracture, but negative CT scan. Pt had blood work done recently that showed a change in renal function. And has been hydrating at home. family is concerned at this time because of the large volume of urine that she has that she may be getting worsening of her dehydration. Family states that the primary care provider has suggested that if kidney function is worsening she may need to be hospitalized. She is also continuing to complain of pain with ambulation. The family is asking why she is still in pain despite negative x-rays and negative CT scan. They are concerned at this time that she may have some issues with her lumbar spine. Past Medical History  Diagnosis Date  . Sinus bradycardia   . TIA (transient ischemic attack) 2000's X 1  . Trigeminal neuralgia of left side of face 12/13/2014  . Acute on chronic diastolic heart failure (HCC)   . Paroxysmal atrial fibrillation (HCC)   . Unstable gait    . Idiopathic scoliosis    . Gluten intolerance    . Complication of anesthesia     "30 minutes violent shaking after a dental procedure"   . Heart murmur   . Pneumonia 2011; 11/2014  . Pleural effusion 2011    "hugh w/pneumonia"  . Hypothyroidism     "from the aminodarone"  . Degenerative disc disease, lumbar    . Arthritis     "plenty" (06/17/2015)  . Chronic back pain   . AICD (automatic cardioverter/defibrillator) present   . Stroke [facility])     TIA  . CHF (congestive heart failure) (HCC)   . Atrial fibrillation  (HCC)   . Hypertension   . Thyroid disease    Past Surgical History  Procedure Laterality Date  . Tonsillectomy  1944  . Toenail avulsion Left     2nd digit  . Loop recorder implant  08/2013    explanted 06/17/2015  . Cataract extraction w/ intraocular lens  implant, bilateral Bilateral 1991-1992  . Bi-ventricular implantable cardioverter defibrillator  (crt-d)  06/17/2015  . Ep implantable device N/A 06/17/2015    Procedure: Pacemaker Implant;  Surgeon: Duke Salvia, MD;  Location: [facility] INVASIVE CV LAB;  Service: Cardiovascular;  Laterality: N/A;  . Pacemaker insertion     Family History  Problem Relation Age of Onset  . Hypertension Father    Social History  Substance Use Topics  . Smoking status: Never Smoker   . Smokeless tobacco: Not on file  . Alcohol Use: No   OB History    Gravida Para Term Preterm AB TAB SAB Ectopic Multiple Living   0 0 0 0 0 0 0 0       Review of Systems  Allergies  Codeine; Codeine; Levaquin; Levaquin; Loratadine; Xylocaine; Xylocaine; and Loratadine  Home Medications   Prior to Admission medications   Medication Sig Start Date End Date Taking? Authorizing Provider  amiodarone (PACERONE) 200 MG tablet Take 200 mg by mouth daily.    Historical Provider, MD  apixaban (ELIQUIS) 2.5 MG TABS tablet Take 1 tablet (2.5 mg total) by mouth 2 (two) times daily. 07/13/15   Duke Salvia, MD  b complex vitamins tablet Take 0.5 tablets by mouth 3 (three) times daily.     Historical Provider, MD  beta carotene 16109 UNIT capsule Take 25,000 Units by mouth daily.    Historical Provider, MD  calcium carbonate (OSCAL) 1500 (600 CA) MG TABS tablet Take 1,500 mg by mouth daily with breakfast.    Historical Provider, MD  Cholecalciferol (VITAMIN D3) 5000 UNITS TABS Take 1 tablet by mouth daily.     Historical Provider, MD  CHROMIUM PO Take 100 mcg by mouth daily.    Historical Provider, MD  fexofenadine (ALLEGRA) 180 MG tablet Take 1 tablet (180 mg total) by mouth  daily. 05/17/15   Kimber Relic, MD  levothyroxine (SYNTHROID, LEVOTHROID) 88 MCG tablet Take 88 mcg by mouth daily before breakfast.    Historical Provider, MD  lisinopril (PRINIVIL,ZESTRIL) 5 MG tablet Take 1 tablet (5 mg total) by mouth daily. 12/07/15   Corky Crafts, MD  magnesium oxide (MAG-OX) 400 MG tablet Take 400 mg by mouth daily.    Historical Provider, MD  Multiple Vitamin (MULTIVITAMIN WITH MINERALS) TABS tablet Take 1 tablet by mouth daily.    Historical Provider, MD  POTASSIUM PO Take 1 tablet by mouth daily.    Historical Provider, MD  pregabalin (LYRICA) 25 MG capsule Take 25 mg by mouth every other day. Reported on 01/05/2016    Historical Provider, MD  selenium 50 MCG TABS tablet Take 50 mcg by mouth daily.    Historical Provider, MD  torsemide (DEMADEX) 20 MG tablet Take 1/2 tablet daily Patient taking differently: Take 10 mg by mouth daily. Take 1/2 tablet daily 11/29/15   Rosalio Macadamia, NP  VITAMIN A PO Take 1 tablet by mouth daily.     Historical Provider, MD  vitamin B-12 (CYANOCOBALAMIN) 1000 MCG tablet Take 1,000 mcg by mouth daily.    Historical Provider, MD  vitamin C (ASCORBIC ACID) 500 MG tablet Take 500 mg by mouth daily.    Historical Provider, MD  vitamin E 400 UNIT capsule Take 400 Units by mouth daily.    Historical Provider, MD  Zinc Sulfate (ZINC 15 PO) Take 1 tablet by mouth daily.     Historical Provider, MD   Meds Ordered and Administered this Visit  Medications - No data to display  BP 162/56 mmHg  Pulse 60  Temp(Src) 98.9 F (37.2 C) (Oral)  SpO2 97% No data found.   Physical Exam  Constitutional: She is oriented to person, place, and time. She appears well-developed and well-nourished. No distress.  Eyes: Conjunctivae are normal.  Pulmonary/Chest: Effort normal.  Neurological: She is alert and oriented to person, place, and time.  Skin: Skin is warm and dry.  Psychiatric: She has a normal mood and affect. Her behavior is normal.  Judgment and thought content normal.    ED Course  Procedures (including critical care time)  Labs Review Labs Reviewed  BASIC METABOLIC PANEL - Abnormal; Notable for the following:    Chloride 96 (*)    Glucose, Bld 109 (*)    BUN 28 (*)    Calcium 8.8 (*)    GFR calc non Af Amer 54 (*)    All other components within normal limits  POCT URINALYSIS DIP (DEVICE) - Abnormal; Notable for the following:    Hgb urine dipstick TRACE (*)  All other components within normal limits  POCT URINALYSIS DIP (DEVICE)    Imaging Review No results found.   Visual Acuity Review  Right Eye Distance:   Left Eye Distance:   Bilateral Distance:    Right Eye Near:   Left Eye Near:    Bilateral Near:       Discussed results with family and patient. Daughter is a FP with [employer].  She is happy with results Will make arrangements with PCP to discuss ambulation.   MDM   1. Polyuria   2. Hip pain, acute, unspecified laterality   3. AKI (acute kidney injury) [facility](HCC)     #3 resolving    Tharon AquasFrank C Starlina Lapre, [place] 01/09/16 1715

## 2016-01-09 NOTE — Discharge Instructions (Signed)
Continue your rehydration plan at home.  Use your walker to help with ambulation  Your doctor may prescribed physical therapy  BUN 28 Cr. 0.89 eGFR 54 UA sp grav 1.015

## 2016-01-11 ENCOUNTER — Encounter: Payer: Self-pay | Admitting: Nurse Practitioner

## 2016-01-11 ENCOUNTER — Non-Acute Institutional Stay (SKILLED_NURSING_FACILITY): Payer: Medicare PPO | Admitting: Nurse Practitioner

## 2016-01-11 DIAGNOSIS — R531 Weakness: Secondary | ICD-10-CM

## 2016-01-11 DIAGNOSIS — I10 Essential (primary) hypertension: Secondary | ICD-10-CM | POA: Diagnosis not present

## 2016-01-11 DIAGNOSIS — R600 Localized edema: Secondary | ICD-10-CM

## 2016-01-11 DIAGNOSIS — D62 Acute posthemorrhagic anemia: Secondary | ICD-10-CM | POA: Diagnosis not present

## 2016-01-11 DIAGNOSIS — G458 Other transient cerebral ischemic attacks and related syndromes: Secondary | ICD-10-CM

## 2016-01-11 DIAGNOSIS — I480 Paroxysmal atrial fibrillation: Secondary | ICD-10-CM

## 2016-01-11 DIAGNOSIS — I5032 Chronic diastolic (congestive) heart failure: Secondary | ICD-10-CM

## 2016-01-11 DIAGNOSIS — R2681 Unsteadiness on feet: Secondary | ICD-10-CM

## 2016-01-11 DIAGNOSIS — E039 Hypothyroidism, unspecified: Secondary | ICD-10-CM | POA: Diagnosis not present

## 2016-01-11 NOTE — Assessment & Plan Note (Signed)
Heart rate is in control. Continue Amiodarone 200mg daily. Eliquis 2.5mg bid for thromboembolism risk reduction.  

## 2016-01-11 NOTE — Assessment & Plan Note (Signed)
-  HTN: patient to follow low sodium diet -continue Torsemide , Lisinopril .

## 2016-01-11 NOTE — Assessment & Plan Note (Signed)
Chronic, continue Torsemide 10mg daily.  

## 2016-01-11 NOTE — Assessment & Plan Note (Signed)
Hx of TIA, the patient stated she didn't believe the fall was related to ? TIA. Its mechanical fall, denied HA, palpitation, chest pain or pressure, or focal weakness associated with her fall.

## 2016-01-11 NOTE — Assessment & Plan Note (Signed)
Last Hgb 12.9 11/23/16, update CBC

## 2016-01-11 NOTE — Assessment & Plan Note (Addendum)
Larey Seat without apparent injury except generalized weakness. PT and OT to eval and tx as indicated.

## 2016-01-11 NOTE — Assessment & Plan Note (Signed)
Clinically compensated, continue Torsemide  daily.

## 2016-01-11 NOTE — Assessment & Plan Note (Signed)
05/17/15 TSH 17.532 Takes Levothyroxine , update TSH

## 2016-01-11 NOTE — Progress Notes (Signed)
Patient ID: Melanie Obrien, female   DOB: Feb 02, 1919, 82 y.o.   MRN: [id number]  Location:  SNF [facility] Provider:  Chipper Oman NP  Code Status:  DNR Goals of care: Advanced Directive information    Chief Complaint  Patient presents with  . Medical Management of Chronic Issues  . Acute Visit    generalized weakness     HPI: Patient is a 82 y.o. female seen in the SNF at [facility] today for evaluation of admitted to SNF due to her lack of self sufficiency in self care related to her generalized weakness. Hx of hypothyroidism, on Levothyroxine , HTN, on Lisinopril  and Furosemide , elevated Sbp in 160s, Afib, rate is controlled on Amiodarone, Eliquis for thromboembolic risk reduction. CT pelvis 01/05/16 IMPRESSION: Moderate degenerative joint disease is seen involving both hips. No acute abnormality seen in the pelvis.   Review of Systems  Constitutional: Negative for fever, chills and diaphoresis.  HENT: Positive for hearing loss. Negative for ear discharge, ear pain, nosebleeds, sore throat and tinnitus.        Rhinitis.   Eyes: Negative for photophobia, pain, discharge and redness.  Respiratory: Positive for shortness of breath. Negative for cough, wheezing and stridor.   Cardiovascular: Positive for leg swelling.       Chronic edema BLE  Gastrointestinal: Negative for nausea, vomiting, abdominal pain, diarrhea, constipation and blood in stool.       Patient states she has been gluten intolerant for the last couple of years.  Genitourinary: Positive for frequency. Negative for dysuria, urgency, hematuria and flank pain.  Musculoskeletal: Negative for myalgias, back pain and neck pain.       Left humerus head fx. Limited overhead ROM. Ambulates with walker.   Skin: Negative for rash.       Massive ecchymoses left arm   Neurological: Positive for weakness. Negative for dizziness, tremors, seizures and headaches.  Endo/Heme/Allergies: Positive for environmental  allergies. Negative for polydipsia. Does not bruise/bleed easily.  Psychiatric/Behavioral: Negative for hallucinations. The patient is not nervous/anxious.     Past Medical History  Diagnosis Date  . Sinus bradycardia   . TIA (transient ischemic attack) 2000's X 1  . Trigeminal neuralgia of left side of face 12/13/2014  . Acute on chronic diastolic heart failure (HCC)   . Paroxysmal atrial fibrillation (HCC)   . Unstable gait    . Idiopathic scoliosis    . Gluten intolerance    . Complication of anesthesia     "30 minutes violent shaking after a dental procedure"   . Heart murmur   . Pneumonia 2011; 11/2014  . Pleural effusion 2011    "hugh w/pneumonia"  . Hypothyroidism     "from the aminodarone"  . Degenerative disc disease, lumbar    . Arthritis     "plenty" (06/17/2015)  . Chronic back pain   . AICD (automatic cardioverter/defibrillator) present   . Stroke [facility])     TIA  . CHF (congestive heart failure) (HCC)   . Atrial fibrillation (HCC)   . Hypertension   . Thyroid disease   . Diastolic heart failure (HCC)   . Osteoporosis     Patient Active Problem List   Diagnosis Date Noted  . Fatigue 06/17/2015  . Sinus node dysfunction (HCC) 06/17/2015  . Sinus bradycardia 06/10/2015  . Acute on chronic diastolic CHF (congestive heart failure), NYHA class 3 (HCC) 06/09/2015  . Acute on chronic diastolic heart failure (HCC) 06/09/2015  . Hypothyroidism 05/18/2015  .  Unstable gait 04/24/2015  . Degenerative disc disease, lumbar 04/24/2015  . Idiopathic scoliosis 04/24/2015  . Weakness 04/24/2015  . Dyspnea 04/24/2015  . Gluten intolerance 04/24/2015  . Acute blood loss anemia 04/23/2015  . HTN (hypertension) 04/20/2015  . Fracture of humeral head, left, closed 04/17/2015  . Fall   . Other seasonal allergic rhinitis   . Malnutrition of moderate degree (HCC) 01/21/2015  . Hepatopathy - congestive 01/20/2015  . Chronic diastolic congestive heart failure (HCC) 01/18/2015    . Bilateral lower extremity edema 01/18/2015  . Lower extremity edema   . Paroxysmal atrial fibrillation (HCC)   . Elevated LFTs 01/16/2015  . Syncope 01/16/2015  . SOB (shortness of breath)   . Atrial fibrillation (HCC) 12/13/2014  . TIA (transient ischemic attack) 12/13/2014  . Trigeminal neuralgia of left side of face 12/13/2014  . Hot flashes 12/13/2014    Allergies  Allergen Reactions  . Codeine Other (See Comments)    unknown  . Codeine Nausea And Vomiting    & headaches  . Levaquin [Levofloxacin In D5w] Itching  . Levaquin [Levofloxacin In D5w] Swelling    & redness  . Loratadine     TACHYCARDIA  . Xylocaine [Lidocaine Hcl] Other (See Comments)    Uncontrolled shaking  . Xylocaine [Lidocaine Hcl] Other (See Comments)    Made her very cold & lots of shaking. Pt and daughter stated that she can take it.  . Loratadine Palpitations    Medications: Patient's Medications  New Prescriptions   No medications on file  Previous Medications   AMIODARONE (PACERONE) 200 MG TABLET    Take 200 mg by mouth daily.   APIXABAN (ELIQUIS) 2.5 MG TABS TABLET    Take 1 tablet (2.5 mg total) by mouth 2 (two) times daily.   B COMPLEX VITAMINS TABLET    Take 0.5 tablets by mouth 3 (three) times daily.    BETA CAROTENE 81191 UNIT CAPSULE    Take 25,000 Units by mouth daily.   CALCIUM CARBONATE (OSCAL) 1500 (600 CA) MG TABS TABLET    Take 1,500 mg by mouth daily with breakfast.   CHOLECALCIFEROL (VITAMIN D3) 5000 UNITS TABS    Take 1 tablet by mouth daily.    CHROMIUM PO    Take 100 mcg by mouth daily.   FEXOFENADINE (ALLEGRA) 180 MG TABLET    Take 1 tablet (180 mg total) by mouth daily.   LEVOTHYROXINE (SYNTHROID, LEVOTHROID) 88 MCG TABLET    Take 88 mcg by mouth daily before breakfast.   LISINOPRIL (PRINIVIL,ZESTRIL) 5 MG TABLET    Take 1 tablet (5 mg total) by mouth daily.   MAGNESIUM OXIDE (MAG-OX) 400 MG TABLET    Take 400 mg by mouth daily.   MULTIPLE VITAMIN (MULTIVITAMIN WITH  MINERALS) TABS TABLET    Take 1 tablet by mouth daily.   POTASSIUM PO    Take 1 tablet by mouth daily.   PREGABALIN (LYRICA) 25 MG CAPSULE    Take 25 mg by mouth every other day. Reported on 01/05/2016   SELENIUM 50 MCG TABS TABLET    Take 50 mcg by mouth daily.   TORSEMIDE (DEMADEX) 20 MG TABLET    Take 1/2 tablet daily   VITAMIN A PO    Take 1 tablet by mouth daily.    VITAMIN B-12 (CYANOCOBALAMIN) 1000 MCG TABLET    Take 1,000 mcg by mouth daily.   VITAMIN C (ASCORBIC ACID) 500 MG TABLET    Take 500 mg by mouth  daily.   VITAMIN E 400 UNIT CAPSULE    Take 400 Units by mouth daily.   ZINC SULFATE (ZINC 15 PO)    Take 1 tablet by mouth daily.   Modified Medications   No medications on file  Discontinued Medications   No medications on file    Physical Exam: Filed Vitals:   01/11/16 1318  BP: 164/64  Pulse: 60  Temp: 97 F (36.1 C)  TempSrc: Tympanic  Resp: 16   There is no weight on file to calculate BMI.  Physical Exam  Constitutional: She is oriented to person, place, and time. She appears well-developed and well-nourished. No distress.  HENT:  Head: Normocephalic and atraumatic.  Right Ear: External ear normal.  Left Ear: External ear normal.  Nose: Nose normal.  Mouth/Throat: Oropharynx is clear and moist. No oropharyngeal exudate.  Eyes: Conjunctivae and EOM are normal. Pupils are equal, round, and reactive to light. Right eye exhibits no discharge. Left eye exhibits no discharge. No scleral icterus.  Neck: Normal range of motion. Neck supple. No JVD present. No tracheal deviation present. No thyromegaly present.  Cardiovascular: Intact distal pulses.  An irregular rhythm present. Bradycardia present.  Exam reveals no gallop and no friction rub.   Murmur heard.  Systolic (R & L sternal border) murmur is present with a grade of 3/6  Irregular heart beats.   Pulmonary/Chest: Effort normal and breath sounds normal. No stridor. No respiratory distress. She has no wheezes.  She has no rales. She exhibits no tenderness.  Abdominal: Soft. Bowel sounds are normal. She exhibits no distension and no mass. There is no tenderness. There is no rebound and no guarding.  Musculoskeletal: She exhibits edema and tenderness.  04/16/15 Left humerus fx, mild left arm swelling. BLE trace chronic edema.   Neurological: She is alert and oriented to person, place, and time. She has normal reflexes. She displays normal reflexes. No cranial nerve deficit. She exhibits normal muscle tone. Coordination normal.  Skin: No rash noted. She is not diaphoretic. No erythema. No pallor.  Massive ecchymoses left arm  Psychiatric: She has a normal mood and affect. Her behavior is normal. Judgment and thought content normal.    Labs reviewed: Basic Metabolic Panel:  Recent Labs  09/81/19 1617  06/10/15 0431 06/11/15 0315 11/24/15 1605 01/09/16 1557  NA 138  < > 137 136 133* 139  K 4.3  < > 3.8 4.3 4.2 4.1  CL 96  < > 97* 97* 97* 96*  CO2 29  < > 32 31  --  31  GLUCOSE 98  < > 86 100* 143* 109*  BUN 23  < > 21* 24* 17 28*  CREATININE 0.98  < > 0.80 1.02* 0.60 0.89  CALCIUM 8.8  < > 8.0* 8.1*  --  8.8*  MG 2.0  --   --   --   --   --   < > = values in this interval not displayed.  Liver Function Tests:  Recent Labs  01/18/15 1715 01/20/15 0354 01/22/15 0844  AST 55* 38* 47*  ALT 65* 47* 40*  ALKPHOS 106 94 92  BILITOT 0.6 0.7 0.7  PROT 5.2* 5.1* 5.6*  ALBUMIN 2.9* 2.7* 3.0*    CBC:  Recent Labs  01/18/15 1715  04/17/15 0137  04/26/15 06/09/15 1534 11/24/15 1600 11/24/15 1605  WBC 7.2  < > 9.8  < > 8.4 9.9 8.0  --   NEUTROABS 5.1  --   --   --   --   --  6.1  --   HGB 11.2*  < > 11.0*  < > 8.3* 12.1 11.9* 12.9  HCT 32.6*  < > 31.7*  < > 25* 35.6* 35.3* 38.0  MCV 93.1  < > 92.7  --   --  95.2 94.6  --   PLT 184  < > 202  --  343 218 204  --   < > = values in this interval not displayed.  Lab Results  Component Value Date   TSH 9.513* 06/10/2015   No results  found for: HGBA1C No results found for: CHOL, HDL, LDLCALC, LDLDIRECT, TRIG, CHOLHDL  Significant Diagnostic Results since last visit: none  Patient Care Team: Juluis Rainier, MD as PCP - General (Family Medicine) Duke Salvia, MD as Consulting Physician (Cardiology) Corky Crafts, MD as Consulting Physician (Interventional Cardiology) Sheral Apley, MD as Attending Physician (Orthopedic Surgery)  Assessment/Plan Problem List Items Addressed This Visit    Weakness - Primary (Chronic)    Generalized weakness, ambulates with walker, will update CBC, CMP, UA C/S, TSH, Hgb A1c, CXR, EKG. PT and OT to eval and treat as indicated, her goal is to return [place] as prior when she is able.       Unstable gait (Chronic)    Fell without apparent injury except generalized weakness. PT and OT to eval and tx as indicated.       TIA (transient ischemic attack)    Hx of TIA, the patient stated she didn't believe the fall was related to ? TIA. Its mechanical fall, denied HA, palpitation, chest pain or pressure, or focal weakness associated with her fall.       Lower extremity edema (Chronic)    Chronic, continue Torsemide  daily.       Hypothyroidism    05/17/15 TSH 17.532 Takes Levothyroxine , update TSH      HTN (hypertension) (Chronic)    -HTN: patient to follow low sodium diet -continue Torsemide , Lisinopril .       Chronic diastolic congestive heart failure (HCC) (Chronic)    Clinically compensated, continue Torsemide  daily.       Atrial fibrillation (HCC) (Chronic)    Heart rate is in control. Continue Amiodarone  daily. Eliquis 2.5mg  bid for thromboembolism risk reduction.       Acute blood loss anemia (Chronic)    Last Hgb 12.9 11/23/16, update CBC          Family/ staff Communication: PT and OT to eval and tx as indicated, goal is to return IL when able.   Labs/tests ordered:  CBC, Hgb A1c, TSH,  CXR, EKG  ManXie Winfield Caba  NP Geriatrics [facility] [address], [place] [address] On Call:  [phone] & follow prompts after 5pm & weekends Office Phone:  [phone] Office Fax:  [phone]

## 2016-01-11 NOTE — Assessment & Plan Note (Addendum)
Generalized weakness, ambulates with walker, will update CBC, CMP, UA C/S, TSH, Hgb A1c, CXR, EKG. PT and OT to eval and treat as indicated, her goal is to return [place] as prior when she is able.

## 2016-01-12 ENCOUNTER — Encounter: Payer: Self-pay | Admitting: Cardiology

## 2016-01-14 ENCOUNTER — Other Ambulatory Visit: Payer: Self-pay | Admitting: Nurse Practitioner

## 2016-01-14 DIAGNOSIS — E039 Hypothyroidism, unspecified: Secondary | ICD-10-CM

## 2016-01-14 DIAGNOSIS — D62 Acute posthemorrhagic anemia: Secondary | ICD-10-CM

## 2016-01-14 LAB — TSH: TSH: 5.09 u[IU]/mL (ref 0.41–5.90)

## 2016-01-14 LAB — CBC AND DIFFERENTIAL
HCT: 37 % (ref 36–46)
Hemoglobin: 12.2 g/dL (ref 12.0–16.0)
PLATELETS: 298 10*3/uL (ref 150–399)
WBC: 9.8 10^3/mL

## 2016-01-19 ENCOUNTER — Ambulatory Visit: Payer: Medicare PPO | Admitting: Interventional Cardiology

## 2016-01-28 ENCOUNTER — Non-Acute Institutional Stay (SKILLED_NURSING_FACILITY): Payer: Medicare PPO | Admitting: Nurse Practitioner

## 2016-01-28 ENCOUNTER — Encounter: Payer: Self-pay | Admitting: Nurse Practitioner

## 2016-01-28 DIAGNOSIS — I480 Paroxysmal atrial fibrillation: Secondary | ICD-10-CM

## 2016-01-28 DIAGNOSIS — I10 Essential (primary) hypertension: Secondary | ICD-10-CM

## 2016-01-28 DIAGNOSIS — I5032 Chronic diastolic (congestive) heart failure: Secondary | ICD-10-CM | POA: Diagnosis not present

## 2016-01-28 DIAGNOSIS — E039 Hypothyroidism, unspecified: Secondary | ICD-10-CM | POA: Diagnosis not present

## 2016-01-28 DIAGNOSIS — D62 Acute posthemorrhagic anemia: Secondary | ICD-10-CM | POA: Diagnosis not present

## 2016-01-28 DIAGNOSIS — R600 Localized edema: Secondary | ICD-10-CM | POA: Diagnosis not present

## 2016-01-28 NOTE — Progress Notes (Signed)
Patient ID: Melanie Obrien, female   DOB: [birth year], 82 y.o.   MRN: [id number]

## 2016-01-30 NOTE — Assessment & Plan Note (Signed)
05/17/15 TSH 17.532. 01/14/16 TSH 5.086 Takes Levothyroxine , update TSH 4 weeks.

## 2016-01-30 NOTE — Assessment & Plan Note (Signed)
-  HTN: patient to follow low sodium diet -continue Torsemide 10mg, Lisinopril 5mg.  

## 2016-01-30 NOTE — Assessment & Plan Note (Signed)
Heart rate is in control. Continue Amiodarone  daily. Eliquis 2.5mg  bid for thromboembolism risk reduction.

## 2016-01-30 NOTE — Assessment & Plan Note (Signed)
Chronic, continue Torsemide  daily.

## 2016-01-30 NOTE — Progress Notes (Signed)
Patient ID: Melanie Obrien, female   DOB: October 31, 1919, 82 y.o.   MRN: [id number]  Location:  SNF [facility] Provider:  Chipper Oman NP  Code Status:  DNR Goals of care: Advanced Directive information Does patient have an advance directive?: Yes, Type of Advance Directive: Healthcare Power of [place];Living will  Chief Complaint  Patient presents with  . Acute Visit    Thyroid     HPI: Patient is a 82 y.o. female seen in the SNF at [facility] today for evaluation of mildly elevated TSH 5.086 01/14/16. Staff reported RLE swelling and redness, actually BLE 1+ edema w/o redness or tenderness upon my examination today. Hx of hypothyroidism, on Levothyroxine , HTN, on Lisinopril 5mg  and Furosemide 10mg , elevated Sbp in 160s, Afib, rate is controlled on Amiodarone, Eliquis for thromboembolic risk reduction. CT pelvis 01/05/16 IMPRESSION: Moderate degenerative joint disease is seen involving both hips. No acute abnormality seen in the pelvis.   Review of Systems  Constitutional: Negative for fever, chills and diaphoresis.  HENT: Positive for hearing loss. Negative for ear discharge, ear pain, nosebleeds, sore throat and tinnitus.        Rhinitis.   Eyes: Negative for photophobia, pain, discharge and redness.  Respiratory: Positive for shortness of breath. Negative for cough, wheezing and stridor.   Cardiovascular: Positive for leg swelling.       Chronic edema BLE 1+  Gastrointestinal: Negative for nausea, vomiting, abdominal pain, diarrhea, constipation and blood in stool.       Patient states she has been gluten intolerant for the last couple of years.  Genitourinary: Positive for frequency. Negative for dysuria, urgency, hematuria and flank pain.  Musculoskeletal: Negative for myalgias, back pain and neck pain.       Left humerus head fx. Limited overhead ROM. Ambulates with walker.   Skin: Negative for rash.       Massive ecchymoses left arm   Neurological: Positive for weakness.  Negative for dizziness, tremors, seizures and headaches.  Endo/Heme/Allergies: Positive for environmental allergies. Negative for polydipsia. Does not bruise/bleed easily.  Psychiatric/Behavioral: Negative for hallucinations. The patient is not nervous/anxious.     Past Medical History  Diagnosis Date  . Sinus bradycardia   . TIA (transient ischemic attack) 2000's X 1  . Trigeminal neuralgia of left side of face 12/13/2014  . Acute on chronic diastolic heart failure (HCC)   . Paroxysmal atrial fibrillation (HCC)   . Unstable gait    . Idiopathic scoliosis    . Gluten intolerance    . Complication of anesthesia     "30 minutes violent shaking after a dental procedure"   . Heart murmur   . Pneumonia 2011; 11/2014  . Pleural effusion 2011    "hugh w/pneumonia"  . Hypothyroidism     "from the aminodarone"  . Degenerative disc disease, lumbar    . Arthritis     "plenty" (06/17/2015)  . Chronic back pain   . AICD (automatic cardioverter/defibrillator) present   . Stroke [facility])     TIA  . CHF (congestive heart failure) (HCC)   . Atrial fibrillation (HCC)   . Hypertension   . Thyroid disease   . Diastolic heart failure (HCC)   . Osteoporosis     Patient Active Problem List   Diagnosis Date Noted  . Fatigue 06/17/2015  . Sinus node dysfunction (HCC) 06/17/2015  . Sinus bradycardia 06/10/2015  . Acute on chronic diastolic CHF (congestive heart failure), NYHA class 3 (HCC) 06/09/2015  .  Acute on chronic diastolic heart failure (HCC) 06/09/2015  . Hypothyroidism 05/18/2015  . Unstable gait 04/24/2015  . Degenerative disc disease, lumbar 04/24/2015  . Idiopathic scoliosis 04/24/2015  . Weakness 04/24/2015  . Dyspnea 04/24/2015  . Gluten intolerance 04/24/2015  . Acute blood loss anemia 04/23/2015  . HTN (hypertension) 04/20/2015  . Fracture of humeral head, left, closed 04/17/2015  . Fall   . Other seasonal allergic rhinitis   . Malnutrition of moderate degree (HCC)  01/21/2015  . Hepatopathy - congestive 01/20/2015  . Chronic diastolic congestive heart failure (HCC) 01/18/2015  . Bilateral lower extremity edema 01/18/2015  . Lower extremity edema   . Paroxysmal atrial fibrillation (HCC)   . Elevated LFTs 01/16/2015  . Syncope 01/16/2015  . SOB (shortness of breath)   . Atrial fibrillation (HCC) 12/13/2014  . TIA (transient ischemic attack) 12/13/2014  . Trigeminal neuralgia of left side of face 12/13/2014  . Hot flashes 12/13/2014    Allergies  Allergen Reactions  . Codeine Other (See Comments)    unknown  . Codeine Nausea And Vomiting    & headaches  . Levaquin [Levofloxacin In D5w] Itching  . Levaquin [Levofloxacin In D5w] Swelling    & redness  . Loratadine     TACHYCARDIA  . Xylocaine [Lidocaine Hcl] Other (See Comments)    Uncontrolled shaking  . Xylocaine [Lidocaine Hcl] Other (See Comments)    Made her very cold & lots of shaking. Pt and daughter stated that she can take it.  . Loratadine Palpitations    Medications: Patient's Medications  New Prescriptions   No medications on file  Previous Medications   ACETAMINOPHEN (TYLENOL) 325 MG TABLET    Take 650 mg by mouth every 4 (four) hours as needed for mild pain or moderate pain.   AMIODARONE (PACERONE) 200 MG TABLET    Take 200 mg by mouth daily.   APIXABAN (ELIQUIS) 2.5 MG TABS TABLET    Take 1 tablet (2.5 mg total) by mouth 2 (two) times daily.   FEXOFENADINE (ALLEGRA) 180 MG TABLET    Take 1 tablet (180 mg total) by mouth daily.   LEVOTHYROXINE (SYNTHROID, LEVOTHROID) 88 MCG TABLET    Take 88 mcg by mouth daily before breakfast.   LISINOPRIL (PRINIVIL,ZESTRIL) 5 MG TABLET    Take 1 tablet (5 mg total) by mouth daily.   TORSEMIDE (DEMADEX) 20 MG TABLET    Take 1/2 tablet daily  Modified Medications   No medications on file  Discontinued Medications   B COMPLEX VITAMINS TABLET    Take 0.5 tablets by mouth 3 (three) times daily.    BETA CAROTENE [address] UNIT CAPSULE    Take  25,000 Units by mouth daily.   CALCIUM CARBONATE (OSCAL) 1500 (600 CA) MG TABS TABLET    Take 1,500 mg by mouth daily with breakfast.   CHOLECALCIFEROL (VITAMIN D3) 5000 UNITS TABS    Take 1 tablet by mouth daily.    CHROMIUM PO    Take 100 mcg by mouth daily.   MAGNESIUM OXIDE (MAG-OX) 400 MG TABLET    Take 400 mg by mouth daily.   MULTIPLE VITAMIN (MULTIVITAMIN WITH MINERALS) TABS TABLET    Take 1 tablet by mouth daily.   POTASSIUM PO    Take 1 tablet by mouth daily.   PREGABALIN (LYRICA) 25 MG CAPSULE    Take 25 mg by mouth every other day. Reported on 01/05/2016   SELENIUM 50 MCG TABS TABLET    Take 50 mcg  by mouth daily.   VITAMIN A PO    Take 1 tablet by mouth daily.    VITAMIN B-12 (CYANOCOBALAMIN) 1000 MCG TABLET    Take 1,000 mcg by mouth daily.   VITAMIN C (ASCORBIC ACID) 500 MG TABLET    Take 500 mg by mouth daily.   VITAMIN E 400 UNIT CAPSULE    Take 400 Units by mouth daily.   ZINC SULFATE (ZINC 15 PO)    Take 1 tablet by mouth daily.     Physical Exam: Filed Vitals:   01/28/16 1142  BP: 161/73  Pulse: 89  Temp: 99.2 F (37.3 C)  TempSrc: Oral  Resp: 16  SpO2: 97%   There is no weight on file to calculate BMI.  Physical Exam  Constitutional: She is oriented to person, place, and time. She appears well-developed and well-nourished. No distress.  HENT:  Head: Normocephalic and atraumatic.  Right Ear: External ear normal.  Left Ear: External ear normal.  Nose: Nose normal.  Mouth/Throat: Oropharynx is clear and moist. No oropharyngeal exudate.  Eyes: Conjunctivae and EOM are normal. Pupils are equal, round, and reactive to light. Right eye exhibits no discharge. Left eye exhibits no discharge. No scleral icterus.  Neck: Normal range of motion. Neck supple. No JVD present. No tracheal deviation present. No thyromegaly present.  Cardiovascular: Intact distal pulses.  An irregular rhythm present. Bradycardia present.  Exam reveals no gallop and no friction rub.   Murmur  heard.  Systolic (R & L sternal border) murmur is present with a grade of 3/6  Irregular heart beats.   Pulmonary/Chest: Effort normal and breath sounds normal. No stridor. No respiratory distress. She has no wheezes. She has no rales. She exhibits no tenderness.  Abdominal: Soft. Bowel sounds are normal. She exhibits no distension and no mass. There is no tenderness. There is no rebound and no guarding.  Musculoskeletal: She exhibits edema and tenderness.  04/16/15 Left humerus fx, mild left arm swelling. BLE chronic edema 1+  Neurological: She is alert and oriented to person, place, and time. She has normal reflexes. She displays normal reflexes. No cranial nerve deficit. She exhibits normal muscle tone. Coordination normal.  Skin: No rash noted. She is not diaphoretic. No erythema. No pallor.  Massive ecchymoses left arm  Psychiatric: She has a normal mood and affect. Her behavior is normal. Judgment and thought content normal.    Labs reviewed: Basic Metabolic Panel:  Recent Labs  69/62/95 1617  06/10/15 0431 06/11/15 0315 11/24/15 1605 01/09/16 1557  NA 138  < > 137 136 133* 139  K 4.3  < > 3.8 4.3 4.2 4.1  CL 96  < > 97* 97* 97* 96*  CO2 29  < > 32 31  --  31  GLUCOSE 98  < > 86 100* 143* 109*  BUN 23  < > 21* 24* 17 28*  CREATININE 0.98  < > 0.80 1.02* 0.60 0.89  CALCIUM 8.8  < > 8.0* 8.1*  --  8.8*  MG 2.0  --   --   --   --   --   < > = values in this interval not displayed.  Liver Function Tests: No results for input(s): AST, ALT, ALKPHOS, BILITOT, PROT, ALBUMIN in the last 8760 hours.  CBC:  Recent Labs  04/17/15 0137  06/09/15 1534 11/24/15 1600 11/24/15 1605 01/14/16  WBC 9.8  < > 9.9 8.0  --  9.8  NEUTROABS  --   --   --  6.1  --   --   HGB 11.0*  < > 12.1 11.9* 12.9 12.2  HCT 31.7*  < > 35.6* 35.3* 38.0 37  MCV 92.7  --  95.2 94.6  --   --   PLT 202  < > 218 204  --  298  < > = values in this interval not displayed.  Lab Results  Component Value  Date   TSH 5.09 01/14/2016   No results found for: HGBA1C No results found for: CHOL, HDL, LDLCALC, LDLDIRECT, TRIG, CHOLHDL  Significant Diagnostic Results since last visit: none  Patient Care Team: Juluis Rainier, MD as PCP - General (Family Medicine) Duke Salvia, MD as Consulting Physician (Cardiology) Corky Crafts, MD as Consulting Physician (Interventional Cardiology) Sheral Apley, MD as Attending Physician (Orthopedic Surgery)  Assessment/Plan Problem List Items Addressed This Visit    Chronic diastolic congestive heart failure (HCC) (Chronic)    Clinically compensated, chronic BLE 1+ edema, continue Torsemide 10mg  dail      Lower extremity edema (Chronic)    Chronic, continue Torsemide 10mg  daily.       Paroxysmal atrial fibrillation (HCC) - Primary (Chronic)    Heart rate is in control. Continue Amiodarone 200mg  daily. Eliquis 2.5mg  bid for thromboembolism risk reduction.       HTN (hypertension) (Chronic)    -HTN: patient to follow low sodium diet -continue Torsemide 10mg , Lisinopril 5mg .       Acute blood loss anemia (Chronic)    Resolved. 01/14/16 Hgb 12.2      Hypothyroidism    05/17/15 TSH 17.532. 01/14/16 TSH 5.086 Takes Levothyroxine , update TSH 4 weeks.           Family/ staff Communication: PT and OT to eval and tx as indicated, goal is to return IL when able.   Labs/tests ordered:  TSH 4 weeks.   [facility] Mast NP Geriatrics [facility] [phone] [place], [place] [address] On Call:  [phone] & follow prompts after 5pm & weekends Office Phone:  [phone] Office Fax:  [phone]

## 2016-01-30 NOTE — Assessment & Plan Note (Signed)
Heart rate is in control. Continue Amiodarone 200mg daily. Eliquis 2.5mg bid for thromboembolism risk reduction.  

## 2016-01-30 NOTE — Assessment & Plan Note (Signed)
Resolved. 01/14/16 Hgb 12.2

## 2016-01-30 NOTE — Assessment & Plan Note (Signed)
Clinically compensated, chronic BLE 1+ edema, continue Torsemide  dail

## 2016-02-04 ENCOUNTER — Non-Acute Institutional Stay: Payer: Medicare PPO | Admitting: Nurse Practitioner

## 2016-02-04 ENCOUNTER — Encounter: Payer: Self-pay | Admitting: Nurse Practitioner

## 2016-02-04 DIAGNOSIS — J069 Acute upper respiratory infection, unspecified: Secondary | ICD-10-CM | POA: Diagnosis not present

## 2016-02-04 DIAGNOSIS — I10 Essential (primary) hypertension: Secondary | ICD-10-CM

## 2016-02-04 DIAGNOSIS — R0602 Shortness of breath: Secondary | ICD-10-CM

## 2016-02-04 DIAGNOSIS — E039 Hypothyroidism, unspecified: Secondary | ICD-10-CM

## 2016-02-04 DIAGNOSIS — R600 Localized edema: Secondary | ICD-10-CM

## 2016-02-04 DIAGNOSIS — D62 Acute posthemorrhagic anemia: Secondary | ICD-10-CM

## 2016-02-04 DIAGNOSIS — I480 Paroxysmal atrial fibrillation: Secondary | ICD-10-CM

## 2016-02-04 DIAGNOSIS — I5032 Chronic diastolic (congestive) heart failure: Secondary | ICD-10-CM

## 2016-02-04 DIAGNOSIS — J209 Acute bronchitis, unspecified: Secondary | ICD-10-CM | POA: Insufficient documentation

## 2016-02-04 NOTE — Assessment & Plan Note (Signed)
Clinically compensated, chronic BLE 1+ edema, continue Torsemide  dail

## 2016-02-04 NOTE — Assessment & Plan Note (Signed)
05/17/15 TSH 17.532. 01/14/16 TSH 5.086 Takes Levothyroxine , update TSH 4 weeks.

## 2016-02-04 NOTE — Assessment & Plan Note (Signed)
-  HTN: patient to follow low sodium diet -continue Torsemide , Lisinopril .

## 2016-02-04 NOTE — Progress Notes (Signed)
Patient ID: Melanie Obrien, female   DOB: November 27, 1919, 82 y.o.   MRN: [id number]  Location:  SNF [facility] Provider:  Chipper Oman NP  Code Status:  DNR Goals of care: Advanced Directive information Does patient have an advance directive?: Yes, Type of Advance Directive: Healthcare Power of Attorney  Chief Complaint  Patient presents with  . Acute Visit    Upper Respiratory infection     HPI: Patient is a 82 y.o. female seen in the SNF at [facility] today for evaluation of non productive cough, denied chest pain, no O2 desaturation, she is afebrile, refused Robitussin for her cough.  TSH 5.086 01/14/16, its decided to f/u the trend of TSH . Staff reported RLE swelling and redness, actually BLE 1+ edema w/o redness or tenderness upon my examination today. Hx of hypothyroidism, on Levothyroxine , HTN, on Lisinopril  and Furosemide , elevated Sbp in 160s, Afib, rate is controlled on Amiodarone, Eliquis for thromboembolic risk reduction. CT pelvis 01/05/16 IMPRESSION: Moderate degenerative joint disease is seen involving both hips. No acute abnormality seen in the pelvis.   Review of Systems  Constitutional: Negative for fever, chills and diaphoresis.  HENT: Positive for hearing loss. Negative for ear discharge, ear pain, nosebleeds, sore throat and tinnitus.        Rhinitis.   Eyes: Negative for photophobia, pain, discharge and redness.  Respiratory: Positive for cough and shortness of breath. Negative for wheezing and stridor.   Cardiovascular: Positive for leg swelling.       Chronic edema BLE 1+  Gastrointestinal: Negative for nausea, vomiting, abdominal pain, diarrhea, constipation and blood in stool.       Patient states she has been gluten intolerant for the last couple of years.  Genitourinary: Positive for frequency. Negative for dysuria, urgency, hematuria and flank pain.  Musculoskeletal: Negative for myalgias, back pain and neck pain.       Left humerus head fx. Limited  overhead ROM. Ambulates with walker.   Skin: Negative for rash.       Massive ecchymoses left arm   Neurological: Positive for weakness. Negative for dizziness, tremors, seizures and headaches.  Endo/Heme/Allergies: Positive for environmental allergies. Negative for polydipsia. Does not bruise/bleed easily.  Psychiatric/Behavioral: Negative for hallucinations. The patient is not nervous/anxious.     Past Medical History  Diagnosis Date  . Sinus bradycardia   . TIA (transient ischemic attack) 2000's X 1  . Trigeminal neuralgia of left side of face 12/13/2014  . Acute on chronic diastolic heart failure (HCC)   . Paroxysmal atrial fibrillation (HCC)   . Unstable gait    . Idiopathic scoliosis    . Gluten intolerance    . Complication of anesthesia     "30 minutes violent shaking after a dental procedure"   . Heart murmur   . Pneumonia 2011; 11/2014  . Pleural effusion 2011    "hugh w/pneumonia"  . Hypothyroidism     "from the aminodarone"  . Degenerative disc disease, lumbar    . Arthritis     "plenty" (06/17/2015)  . Chronic back pain   . AICD (automatic cardioverter/defibrillator) present   . Stroke [facility])     TIA  . CHF (congestive heart failure) (HCC)   . Atrial fibrillation (HCC)   . Hypertension   . Thyroid disease   . Diastolic heart failure (HCC)   . Osteoporosis     Patient Active Problem List   Diagnosis Date Noted  . Acute upper respiratory infection  02/04/2016  . Fatigue 06/17/2015  . Sinus node dysfunction (HCC) 06/17/2015  . Sinus bradycardia 06/10/2015  . Acute on chronic diastolic CHF (congestive heart failure), NYHA class 3 (HCC) 06/09/2015  . Acute on chronic diastolic heart failure (HCC) 06/09/2015  . Hypothyroidism 05/18/2015  . Unstable gait 04/24/2015  . Degenerative disc disease, lumbar 04/24/2015  . Idiopathic scoliosis 04/24/2015  . Weakness 04/24/2015  . Dyspnea 04/24/2015  . Gluten intolerance 04/24/2015  . Acute blood loss anemia  04/23/2015  . HTN (hypertension) 04/20/2015  . Fracture of humeral head, left, closed 04/17/2015  . Fall   . Other seasonal allergic rhinitis   . Malnutrition of moderate degree (HCC) 01/21/2015  . Hepatopathy - congestive 01/20/2015  . Chronic diastolic congestive heart failure (HCC) 01/18/2015  . Bilateral lower extremity edema 01/18/2015  . Lower extremity edema   . Paroxysmal atrial fibrillation (HCC)   . Elevated LFTs 01/16/2015  . Syncope 01/16/2015  . SOB (shortness of breath)   . Atrial fibrillation (HCC) 12/13/2014  . TIA (transient ischemic attack) 12/13/2014  . Trigeminal neuralgia of left side of face 12/13/2014  . Hot flashes 12/13/2014    Allergies  Allergen Reactions  . Codeine Other (See Comments)    unknown  . Codeine Nausea And Vomiting    & headaches  . Levaquin [Levofloxacin In D5w] Itching  . Levaquin [Levofloxacin In D5w] Swelling    & redness  . Loratadine     TACHYCARDIA  . Xylocaine [Lidocaine Hcl] Other (See Comments)    Uncontrolled shaking  . Xylocaine [Lidocaine Hcl] Other (See Comments)    Made her very cold & lots of shaking. Pt and daughter stated that she can take it.  . Loratadine Palpitations    Medications: Patient's Medications  New Prescriptions   No medications on file  Previous Medications   ACETAMINOPHEN (TYLENOL) 325 MG TABLET    Take 650 mg by mouth every 4 (four) hours as needed for mild pain or moderate pain.   AMIODARONE (PACERONE) 200 MG TABLET    Take 200 mg by mouth daily.   APIXABAN (ELIQUIS) 2.5 MG TABS TABLET    Take 1 tablet (2.5 mg total) by mouth 2 (two) times daily.   FEXOFENADINE (ALLEGRA) 180 MG TABLET    Take 1 tablet (180 mg total) by mouth daily.   LEVOTHYROXINE (SYNTHROID, LEVOTHROID) 88 MCG TABLET    Take 88 mcg by mouth daily before breakfast. Reported on 02/04/2016   LISINOPRIL (PRINIVIL,ZESTRIL) 5 MG TABLET    Take 1 tablet (5 mg total) by mouth daily.   TORSEMIDE (DEMADEX) 10 MG TABLET    Take 10 mg by  mouth daily.  Modified Medications   No medications on file  Discontinued Medications   TORSEMIDE (DEMADEX) 20 MG TABLET    Take 1/2 tablet daily    Physical Exam: There were no vitals filed for this visit. There is no weight on file to calculate BMI.  Physical Exam  Constitutional: She is oriented to person, place, and time. She appears well-developed and well-nourished. No distress.  HENT:  Head: Normocephalic and atraumatic.  Right Ear: External ear normal.  Left Ear: External ear normal.  Nose: Nose normal.  Mouth/Throat: Oropharynx is clear and moist. No oropharyngeal exudate.  Eyes: Conjunctivae and EOM are normal. Pupils are equal, round, and reactive to light. Right eye exhibits no discharge. Left eye exhibits no discharge. No scleral icterus.  Neck: Normal range of motion. Neck supple. No JVD present. No tracheal deviation  present. No thyromegaly present.  Cardiovascular: Intact distal pulses.  An irregular rhythm present. Bradycardia present.  Exam reveals no gallop and no friction rub.   Murmur heard.  Systolic (R & L sternal border) murmur is present with a grade of 3/6  Irregular heart beats.   Pulmonary/Chest: Effort normal and breath sounds normal. No stridor. No respiratory distress. She has no wheezes. She has no rales. She exhibits no tenderness.  Abdominal: Soft. Bowel sounds are normal. She exhibits no distension and no mass. There is no tenderness. There is no rebound and no guarding.  Musculoskeletal: She exhibits edema and tenderness.  04/16/15 Left humerus fx, mild left arm swelling. BLE chronic edema 1+  Neurological: She is alert and oriented to person, place, and time. She has normal reflexes. She displays normal reflexes. No cranial nerve deficit. She exhibits normal muscle tone. Coordination normal.  Skin: No rash noted. She is not diaphoretic. No erythema. No pallor.  Massive ecchymoses left arm  Psychiatric: She has a normal mood and affect. Her behavior  is normal. Judgment and thought content normal.    Labs reviewed: Basic Metabolic Panel:  Recent Labs  16/10/96 1617  06/10/15 0431 06/11/15 0315 11/24/15 1605 01/09/16 1557  NA 138  < > 137 136 133* 139  K 4.3  < > 3.8 4.3 4.2 4.1  CL 96  < > 97* 97* 97* 96*  CO2 29  < > 32 31  --  31  GLUCOSE 98  < > 86 100* 143* 109*  BUN 23  < > 21* 24* 17 28*  CREATININE 0.98  < > 0.80 1.02* 0.60 0.89  CALCIUM 8.8  < > 8.0* 8.1*  --  8.8*  MG 2.0  --   --   --   --   --   < > = values in this interval not displayed.  Liver Function Tests: No results for input(s): AST, ALT, ALKPHOS, BILITOT, PROT, ALBUMIN in the last 8760 hours.  CBC:  Recent Labs  04/17/15 0137  06/09/15 1534 11/24/15 1600 11/24/15 1605 01/14/16  WBC 9.8  < > 9.9 8.0  --  9.8  NEUTROABS  --   --   --  6.1  --   --   HGB 11.0*  < > 12.1 11.9* 12.9 12.2  HCT 31.7*  < > 35.6* 35.3* 38.0 37  MCV 92.7  --  95.2 94.6  --   --   PLT 202  < > 218 204  --  298  < > = values in this interval not displayed.  Lab Results  Component Value Date   TSH 5.09 01/14/2016   No results found for: HGBA1C No results found for: CHOL, HDL, LDLCALC, LDLDIRECT, TRIG, CHOLHDL  Significant Diagnostic Results since last visit: none  Patient Care Team: Juluis Rainier, MD as PCP - General (Family Medicine) Duke Salvia, MD as Consulting Physician (Cardiology) Corky Crafts, MD as Consulting Physician (Interventional Cardiology) Sheral Apley, MD as Attending Physician (Orthopedic Surgery)  Assessment/Plan Problem List Items Addressed This Visit    Atrial fibrillation (HCC) (Chronic)    Heart rate is in control. Continue Amiodarone 200mg  daily. Eliquis 2.5mg  bid for thromboembolism risk reduction.       Relevant Medications   torsemide (DEMADEX) 10 MG tablet   Chronic diastolic congestive heart failure (HCC) (Chronic)    Clinically compensated, chronic BLE 1+ edema, continue Torsemide 10mg  dail      Relevant  Medications  torsemide (DEMADEX) 10 MG tablet   Lower extremity edema (Chronic)    Chronic, continue Torsemide 10mg  daily.       HTN (hypertension) (Chronic)    -HTN: patient to follow low sodium diet -continue Torsemide 10mg , Lisinopril 5mg .       Relevant Medications   torsemide (DEMADEX) 10 MG tablet   Acute blood loss anemia (Chronic)    Resolved. 01/14/16 Hgb 12.2      SOB (shortness of breath)    01/11/16 CXR cardiomegaly without overt congestive heart failure, no other acute cardiopulmonary pathology evident      Hypothyroidism    05/17/15 TSH 17.532. 01/14/16 TSH 5.086 Takes Levothyroxine , update TSH 4 weeks.       Acute upper respiratory infection - Primary    May consider CXR if no better upon completion of Azithromycin.           Family/ staff Communication: PT and OT to eval and tx as indicated, goal is to return [place] when able.   Labs/tests ordered:  TSH 4 weeks scheduled.   [facility] Mast NP Geriatrics [facility] [phone], [place] [address] On Call:  [phone] & follow prompts after 5pm & weekends Office Phone:  [phone] Office Fax:  [phone]

## 2016-02-04 NOTE — Assessment & Plan Note (Signed)
Heart rate is in control. Continue Amiodarone 200mg daily. Eliquis 2.5mg bid for thromboembolism risk reduction.  

## 2016-02-04 NOTE — Assessment & Plan Note (Signed)
May consider CXR if no better upon completion of Azithromycin.

## 2016-02-04 NOTE — Assessment & Plan Note (Signed)
01/11/16 CXR cardiomegaly without overt congestive heart failure, no other acute cardiopulmonary pathology evident

## 2016-02-04 NOTE — Assessment & Plan Note (Signed)
Chronic, continue Torsemide  daily.

## 2016-02-04 NOTE — Assessment & Plan Note (Signed)
Resolved. 01/14/16 Hgb 12.2 

## 2016-02-11 ENCOUNTER — Non-Acute Institutional Stay: Payer: Medicare PPO | Admitting: Nurse Practitioner

## 2016-02-11 ENCOUNTER — Encounter: Payer: Self-pay | Admitting: Nurse Practitioner

## 2016-02-11 ENCOUNTER — Encounter: Payer: Self-pay | Admitting: Cardiology

## 2016-02-11 DIAGNOSIS — I5032 Chronic diastolic (congestive) heart failure: Secondary | ICD-10-CM

## 2016-02-11 DIAGNOSIS — I10 Essential (primary) hypertension: Secondary | ICD-10-CM

## 2016-02-11 DIAGNOSIS — I480 Paroxysmal atrial fibrillation: Secondary | ICD-10-CM

## 2016-02-11 DIAGNOSIS — R600 Localized edema: Secondary | ICD-10-CM

## 2016-02-11 DIAGNOSIS — J209 Acute bronchitis, unspecified: Secondary | ICD-10-CM

## 2016-02-11 DIAGNOSIS — D62 Acute posthemorrhagic anemia: Secondary | ICD-10-CM

## 2016-02-11 DIAGNOSIS — R0602 Shortness of breath: Secondary | ICD-10-CM

## 2016-02-11 DIAGNOSIS — E039 Hypothyroidism, unspecified: Secondary | ICD-10-CM | POA: Diagnosis not present

## 2016-02-11 NOTE — Assessment & Plan Note (Signed)
Clinically compensated, chronic BLE 1+ edema, continue Torsemide 10mg daily 

## 2016-02-11 NOTE — Assessment & Plan Note (Signed)
01/11/16 CXR cardiomegaly without overt congestive heart failure, no other acute cardiopulmonary pathology evident 02/08/16 CXR no focal infiltrates or pleural effusion

## 2016-02-11 NOTE — Assessment & Plan Note (Signed)
Heart rate is in control. Continue Amiodarone  daily. Eliquis 2.5mg  bid for thromboembolism risk reduction.

## 2016-02-11 NOTE — Assessment & Plan Note (Signed)
Heart rate is in control. Continue Amiodarone 200mg daily. Eliquis 2.5mg bid for thromboembolism risk reduction.  

## 2016-02-11 NOTE — Assessment & Plan Note (Signed)
-  HTN: patient to follow low sodium diet -continue Torsemide 10mg, Lisinopril 5mg.  

## 2016-02-11 NOTE — Assessment & Plan Note (Signed)
05/17/15 TSH 17.532. 01/14/16 TSH 5.086 Takes Levothyroxine 88mcg, update TSH 4 weeks.  

## 2016-02-11 NOTE — Assessment & Plan Note (Signed)
Chronic, continue Torsemide 10mg daily.  

## 2016-02-11 NOTE — Assessment & Plan Note (Addendum)
persisted cough and phlegm, but less amount and color in her sputum, completed Zpk, denied chest pain or SOB, no O2 desaturation, CXR 02/08/16 no focal infiltrates or pleural effusion. Coarse rhonchi posterior mid lung, will try Augmentin  po q12h x 7 days and Medrol dose pk.

## 2016-02-11 NOTE — Assessment & Plan Note (Signed)
Resolved. 01/14/16 Hgb 12.2

## 2016-02-11 NOTE — Progress Notes (Signed)
Patient ID: Melanie Obrien, female   DOB: [birth year], 82 y.o.   MRN: [id number]  Location:  SNF [facility] Provider:  Chipper Oman NP  Code Status:  DNR Goals of care: Advanced Directive information Does patient have an advance directive?: Yes, Type of Advance Directive: Healthcare Power of Attorney  Chief Complaint  Patient presents with  . Acute Visit    cough     HPI: Patient is a 82 y.o. female seen in the SNF at [facility] today for evaluation of persisted cough and phlegm, but less amount and color in her sputum, completed Zpk, denied chest pain or SOB, no O2 desaturation, CXR 02/08/16 no focal infiltrates or pleural effusion. TSH 5.086 01/14/16, its decided to f/u the trend of TSH . Staff reported RLE swelling and redness, actually BLE 1+ edema w/o redness or tenderness upon my examination today. Hx of hypothyroidism, on Levothyroxine , HTN, on Lisinopril 5mg  and Furosemide 10mg , elevated Sbp in 160s, Afib, rate is controlled on Amiodarone, Eliquis for thromboembolic risk reduction. CT pelvis 01/05/16 IMPRESSION: Moderate degenerative joint disease is seen involving both hips. No acute abnormality seen in the pelvis.   Review of Systems  Constitutional: Negative for fever, chills and diaphoresis.  HENT: Positive for hearing loss. Negative for ear discharge, ear pain, nosebleeds, sore throat and tinnitus.        Rhinitis.   Eyes: Negative for photophobia, pain, discharge and redness.  Respiratory: Positive for cough and shortness of breath. Negative for wheezing and stridor.   Cardiovascular: Positive for leg swelling.       Chronic edema BLE 1+  Gastrointestinal: Negative for nausea, vomiting, abdominal pain, diarrhea, constipation and blood in stool.       Patient states she has been gluten intolerant for the last couple of years.  Genitourinary: Positive for frequency. Negative for dysuria, urgency, hematuria and flank pain.  Musculoskeletal: Negative for myalgias, back pain  and neck pain.       Left humerus head fx. Limited overhead ROM. Ambulates with walker.   Skin: Negative for rash.       Massive ecchymoses left arm   Neurological: Positive for weakness. Negative for dizziness, tremors, seizures and headaches.  Endo/Heme/Allergies: Positive for environmental allergies. Negative for polydipsia. Does not bruise/bleed easily.  Psychiatric/Behavioral: Negative for hallucinations. The patient is not nervous/anxious.     Past Medical History  Diagnosis Date  . Sinus bradycardia   . TIA (transient ischemic attack) 2000's X 1  . Trigeminal neuralgia of left side of face 12/13/2014  . Acute on chronic diastolic heart failure (HCC)   . Paroxysmal atrial fibrillation (HCC)   . Unstable gait    . Idiopathic scoliosis    . Gluten intolerance    . Complication of anesthesia     "30 minutes violent shaking after a dental procedure"   . Heart murmur   . Pneumonia 2011; 11/2014  . Pleural effusion 2011    "hugh w/pneumonia"  . Hypothyroidism     "from the aminodarone"  . Degenerative disc disease, lumbar    . Arthritis     "plenty" (06/17/2015)  . Chronic back pain   . AICD (automatic cardioverter/defibrillator) present   . Stroke [facility])     TIA  . CHF (congestive heart failure) (HCC)   . Atrial fibrillation (HCC)   . Hypertension   . Thyroid disease   . Diastolic heart failure (HCC)   . Osteoporosis     Patient Active Problem List  Diagnosis Date Noted  . Acute bronchitis 02/04/2016  . Fatigue 06/17/2015  . Sinus node dysfunction (HCC) 06/17/2015  . Sinus bradycardia 06/10/2015  . Acute on chronic diastolic CHF (congestive heart failure), NYHA class 3 (HCC) 06/09/2015  . Acute on chronic diastolic heart failure (HCC) 06/09/2015  . Hypothyroidism 05/18/2015  . Unstable gait 04/24/2015  . Degenerative disc disease, lumbar 04/24/2015  . Idiopathic scoliosis 04/24/2015  . Weakness 04/24/2015  . Dyspnea 04/24/2015  . Gluten intolerance 04/24/2015    . Acute blood loss anemia 04/23/2015  . HTN (hypertension) 04/20/2015  . Fracture of humeral head, left, closed 04/17/2015  . Fall   . Other seasonal allergic rhinitis   . Malnutrition of moderate degree (HCC) 01/21/2015  . Hepatopathy - congestive 01/20/2015  . Chronic diastolic congestive heart failure (HCC) 01/18/2015  . Bilateral lower extremity edema 01/18/2015  . Lower extremity edema   . Paroxysmal atrial fibrillation (HCC)   . Elevated LFTs 01/16/2015  . Syncope 01/16/2015  . SOB (shortness of breath)   . Atrial fibrillation (HCC) 12/13/2014  . TIA (transient ischemic attack) 12/13/2014  . Trigeminal neuralgia of left side of face 12/13/2014  . Hot flashes 12/13/2014    Allergies  Allergen Reactions  . Codeine Other (See Comments)    unknown  . Codeine Nausea And Vomiting    & headaches  . Levaquin [Levofloxacin In D5w] Itching  . Levaquin [Levofloxacin In D5w] Swelling    & redness  . Loratadine     TACHYCARDIA  . Xylocaine [Lidocaine Hcl] Other (See Comments)    Uncontrolled shaking  . Xylocaine [Lidocaine Hcl] Other (See Comments)    Made her very cold & lots of shaking. Pt and daughter stated that she can take it.  . Loratadine Palpitations    Medications: Patient's Medications  New Prescriptions   No medications on file  Previous Medications   ACETAMINOPHEN (TYLENOL) 325 MG TABLET    Take 650 mg by mouth every 4 (four) hours as needed for mild pain or moderate pain.   AMIODARONE (PACERONE) 200 MG TABLET    Take 200 mg by mouth daily.   APIXABAN (ELIQUIS) 2.5 MG TABS TABLET    Take 1 tablet (2.5 mg total) by mouth 2 (two) times daily.   FEXOFENADINE (ALLEGRA) 180 MG TABLET    Take 1 tablet (180 mg total) by mouth daily.   LEVOTHYROXINE (SYNTHROID, LEVOTHROID) 88 MCG TABLET    Take 88 mcg by mouth daily before breakfast. Reported on 02/04/2016   LISINOPRIL (PRINIVIL,ZESTRIL) 5 MG TABLET    Take 1 tablet (5 mg total) by mouth daily.   TORSEMIDE (DEMADEX) 10  MG TABLET    Take 10 mg by mouth daily.  Modified Medications   No medications on file  Discontinued Medications   No medications on file    Physical Exam: Filed Vitals:   02/11/16 1135  BP: 162/56  Pulse: 60  Temp: 98 F (36.7 C)  TempSrc: Oral  Resp: 18  Height: 4\' 7"  (1.397 m)  Weight: 92 lb (41.731 kg)  SpO2: 97%   Body mass index is 21.38 kg/(m^2).  Physical Exam  Constitutional: She is oriented to person, place, and time. She appears well-developed and well-nourished. No distress.  HENT:  Head: Normocephalic and atraumatic.  Right Ear: External ear normal.  Left Ear: External ear normal.  Nose: Nose normal.  Mouth/Throat: Oropharynx is clear and moist. No oropharyngeal exudate.  Eyes: Conjunctivae and EOM are normal. Pupils are equal, round, and  reactive to light. Right eye exhibits no discharge. Left eye exhibits no discharge. No scleral icterus.  Neck: Normal range of motion. Neck supple. No JVD present. No tracheal deviation present. No thyromegaly present.  Cardiovascular: Intact distal pulses.  An irregular rhythm present. Bradycardia present.  Exam reveals no gallop and no friction rub.   Murmur heard.  Systolic (R & L sternal border) murmur is present with a grade of 3/6  Irregular heart beats.   Pulmonary/Chest: Effort normal. No stridor. No respiratory distress. She has no wheezes. She has rales. She exhibits no tenderness.  Abdominal: Soft. Bowel sounds are normal. She exhibits no distension and no mass. There is no tenderness. There is no rebound and no guarding.  Musculoskeletal: She exhibits edema and tenderness.  04/16/15 Left humerus fx, mild left arm swelling. BLE chronic edema 1+  Neurological: She is alert and oriented to person, place, and time. She has normal reflexes. She displays normal reflexes. No cranial nerve deficit. She exhibits normal muscle tone. Coordination normal.  Skin: No rash noted. She is not diaphoretic. No erythema. No pallor.    Massive ecchymoses left arm  Psychiatric: She has a normal mood and affect. Her behavior is normal. Judgment and thought content normal.    Labs reviewed: Basic Metabolic Panel:  Recent Labs  16/10/96 1617  06/10/15 0431 06/11/15 0315 11/24/15 1605 01/09/16 1557  NA 138  < > 137 136 133* 139  K 4.3  < > 3.8 4.3 4.2 4.1  CL 96  < > 97* 97* 97* 96*  CO2 29  < > 32 31  --  31  GLUCOSE 98  < > 86 100* 143* 109*  BUN 23  < > 21* 24* 17 28*  CREATININE 0.98  < > 0.80 1.02* 0.60 0.89  CALCIUM 8.8  < > 8.0* 8.1*  --  8.8*  MG 2.0  --   --   --   --   --   < > = values in this interval not displayed.  Liver Function Tests: No results for input(s): AST, ALT, ALKPHOS, BILITOT, PROT, ALBUMIN in the last 8760 hours.  CBC:  Recent Labs  04/17/15 0137  06/09/15 1534 11/24/15 1600 11/24/15 1605 01/14/16  WBC 9.8  < > 9.9 8.0  --  9.8  NEUTROABS  --   --   --  6.1  --   --   HGB 11.0*  < > 12.1 11.9* 12.9 12.2  HCT 31.7*  < > 35.6* 35.3* 38.0 37  MCV 92.7  --  95.2 94.6  --   --   PLT 202  < > 218 204  --  298  < > = values in this interval not displayed.  Lab Results  Component Value Date   TSH 5.09 01/14/2016   No results found for: HGBA1C No results found for: CHOL, HDL, LDLCALC, LDLDIRECT, TRIG, CHOLHDL  Significant Diagnostic Results since last visit: none  Patient Care Team: Juluis Rainier, MD as PCP - General (Family Medicine) Duke Salvia, MD as Consulting Physician (Cardiology) Corky Crafts, MD as Consulting Physician (Interventional Cardiology) Sheral Apley, MD as Attending Physician (Orthopedic Surgery)  Assessment/Plan Problem List Items Addressed This Visit    Chronic diastolic congestive heart failure (HCC) (Chronic)    Clinically compensated, chronic BLE 1+ edema, continue Torsemide  daily      Lower extremity edema (Chronic)    Chronic, continue Torsemide  daily.       Paroxysmal  atrial fibrillation (HCC) (Chronic)    Heart  rate is in control. Continue Amiodarone  daily. Eliquis 2.5mg  bid for thromboembolism risk reduction.       HTN (hypertension) (Chronic)    -HTN: patient to follow low sodium diet -continue Torsemide , Lisinopril .       Acute blood loss anemia (Chronic)    Resolved. 01/14/16 Hgb 12.2      SOB (shortness of breath)    01/11/16 CXR cardiomegaly without overt congestive heart failure, no other acute cardiopulmonary pathology evident 02/08/16 CXR no focal infiltrates or pleural effusion      Hypothyroidism    05/17/15 TSH 17.532. 01/14/16 TSH 5.086 Takes Levothyroxine , update TSH 4 weeks.       Acute bronchitis - Primary    persisted cough and phlegm, but less amount and color in her sputum, completed Zpk, denied chest pain or SOB, no O2 desaturation, CXR 02/08/16 no focal infiltrates or pleural effusion. Coarse rhonchi posterior mid lung, will try Augmentin  po q12h x 7 days and Medrol dose pk.           Family/ staff Communication: PT and OT to eval and tx as indicated, goal is to return [place] when able.   Labs/tests ordered:  TSH 4 weeks scheduled. CXR done 02/08/16  [facility] Nailah Luepke NP Geriatrics [facility] [address], [place] [address] On Call:  [phone] & follow prompts after 5pm & weekends Office Phone:  [phone] Office Fax:  [phone]

## 2016-02-15 ENCOUNTER — Encounter: Payer: Self-pay | Admitting: Nurse Practitioner

## 2016-02-15 ENCOUNTER — Non-Acute Institutional Stay: Payer: Medicare PPO | Admitting: Nurse Practitioner

## 2016-02-15 DIAGNOSIS — I5032 Chronic diastolic (congestive) heart failure: Secondary | ICD-10-CM | POA: Diagnosis not present

## 2016-02-15 DIAGNOSIS — I480 Paroxysmal atrial fibrillation: Secondary | ICD-10-CM | POA: Diagnosis not present

## 2016-02-15 DIAGNOSIS — E039 Hypothyroidism, unspecified: Secondary | ICD-10-CM

## 2016-02-15 DIAGNOSIS — I10 Essential (primary) hypertension: Secondary | ICD-10-CM | POA: Diagnosis not present

## 2016-02-15 DIAGNOSIS — J209 Acute bronchitis, unspecified: Secondary | ICD-10-CM | POA: Diagnosis not present

## 2016-02-15 DIAGNOSIS — R600 Localized edema: Secondary | ICD-10-CM | POA: Diagnosis not present

## 2016-02-15 DIAGNOSIS — F22 Delusional disorders: Secondary | ICD-10-CM

## 2016-02-15 NOTE — Assessment & Plan Note (Signed)
-  HTN: patient to follow low sodium diet -continue Torsemide 10mg, Lisinopril 5mg.  

## 2016-02-15 NOTE — Progress Notes (Signed)
Patient ID: CHIANTI GOH, female   DOB: 1919-01-04, 82 y.o.   MRN: [id number]  Location:  [facility] Room Number: N 31 Place of Service:  SNF (31) Provider: Arna Snipe Terez Freimark NP  Gaye Alken, MD  Patient Care Team: Juluis Rainier, MD as PCP - General (Family Medicine) Duke Salvia, MD as Consulting Physician (Cardiology) Corky Crafts, MD as Consulting Physician (Interventional Cardiology) Sheral Apley, MD as Attending Physician (Orthopedic Surgery)  Extended Emergency Contact Information Primary Emergency Contact: McNeill,Dr. Robin Searing [place] Home Phone: [phone] Work Phone: [phone] Mobile Phone: [phone] Relation: Daughter  Code Status:  DNR Goals of care: Advanced Directive information Advanced Directives 02/15/2016  Does patient have an advance directive? Yes  Type of Advance Directive Healthcare Power of Attorney  Does patient want to make changes to advanced directive? No - Patient declined  Copy of advanced directive(s) in chart? Yes     Chief Complaint  Patient presents with  . Acute Visit    psychosis per [facility]    HPI:  Pt is a 82 y.o. female seen today for an acute visit for abnormal thoughts, Dr. Selena Batten, the patient daughter and POA reported her mother's abnormal thoughts recently and general decline since Amiodarone started about a year ago, desires to discontinue it, the patient actually refused it this am. The patient's cough is improved, phlegm is clear now, completed Z-pk, 7 day course of Augmentin, Medrol dose pack. Hx of Afib, HTN, TIA.    Past Medical History  Diagnosis Date  . Sinus bradycardia   . TIA (transient ischemic attack) 2000's X 1  . Trigeminal neuralgia of left side of face 12/13/2014  . Acute on chronic diastolic heart failure (HCC)   . Paroxysmal atrial fibrillation (HCC)   . Unstable gait    . Idiopathic scoliosis    . Gluten intolerance    . Complication  of anesthesia     "30 minutes violent shaking after a dental procedure"   . Heart murmur   . Pneumonia 2011; 11/2014  . Pleural effusion 2011    "hugh w/pneumonia"  . Hypothyroidism     "from the aminodarone"  . Degenerative disc disease, lumbar    . Arthritis     "plenty" (06/17/2015)  . Chronic back pain   . AICD (automatic cardioverter/defibrillator) present   . Stroke [facility])     TIA  . CHF (congestive heart failure) (HCC)   . Atrial fibrillation (HCC)   . Hypertension   . Thyroid disease   . Diastolic heart failure (HCC)   . Osteoporosis    Past Surgical History  Procedure Laterality Date  . Tonsillectomy  1944  . Toenail avulsion Left     2nd digit  . Loop recorder implant  08/2013    explanted 06/17/2015  . Cataract extraction w/ intraocular lens  implant, bilateral Bilateral 1991-1992  . Bi-ventricular implantable cardioverter defibrillator  (crt-d)  06/17/2015  . Ep implantable device N/A 06/17/2015    Procedure: Pacemaker Implant;  Surgeon: Duke Salvia, MD;  Location: [facility] INVASIVE CV LAB;  Service: Cardiovascular;  Laterality: N/A;  . Pacemaker insertion      Allergies  Allergen Reactions  . Codeine Other (See Comments)    unknown  . Codeine Nausea And Vomiting    & headaches  . Levaquin [Levofloxacin In D5w] Itching  . Levaquin [Levofloxacin In D5w] Swelling    & redness  . Loratadine     TACHYCARDIA  .  Xylocaine [Lidocaine Hcl] Other (See Comments)    Uncontrolled shaking  . Xylocaine [Lidocaine Hcl] Other (See Comments)    Made her very cold & lots of shaking. Pt and daughter stated that she can take it.  . Loratadine Palpitations      Medication List       This list is accurate as of: 02/15/16  3:24 PM.  Always use your most recent med list.               acetaminophen 325 MG tablet  Commonly known as:  TYLENOL  Take 650 mg by mouth every 4 (four) hours as needed for mild pain or moderate pain.     amiodarone 200 MG tablet  Commonly known  as:  PACERONE  Take 200 mg by mouth daily.     apixaban 2.5 MG Tabs tablet  Commonly known as:  ELIQUIS  Take 1 tablet (2.5 mg total) by mouth 2 (two) times daily.     fexofenadine 180 MG tablet  Commonly known as:  ALLEGRA  Take 1 tablet (180 mg total) by mouth daily.     levothyroxine 88 MCG tablet  Commonly known as:  SYNTHROID, LEVOTHROID  Take 88 mcg by mouth daily before breakfast. Reported on 02/04/2016     lisinopril 5 MG tablet  Commonly known as:  PRINIVIL,ZESTRIL  Take 1 tablet (5 mg total) by mouth daily.     torsemide 10 MG tablet  Commonly known as:  DEMADEX  Take 10 mg by mouth daily.        Review of Systems  Constitutional: Negative for fever, chills and diaphoresis.  HENT: Positive for hearing loss. Negative for ear discharge, ear pain, nosebleeds, sore throat and tinnitus.        Rhinitis.   Eyes: Negative for photophobia, pain, discharge and redness.  Respiratory: Positive for cough and shortness of breath. Negative for wheezing and stridor.        Improved.   Cardiovascular: Positive for leg swelling.       Chronic edema BLE 1+  Gastrointestinal: Negative for nausea, vomiting, abdominal pain, diarrhea, constipation and blood in stool.       Patient states she has been gluten intolerant for the last couple of years.  Endocrine: Negative for polydipsia.  Genitourinary: Positive for frequency. Negative for dysuria, urgency, hematuria and flank pain.  Musculoskeletal: Negative for myalgias, back pain and neck pain.       Left humerus head fx. Limited overhead ROM. Ambulates with walker.   Skin: Negative for rash.       Massive ecchymoses left arm   Allergic/Immunologic: Positive for environmental allergies.  Neurological: Positive for weakness. Negative for dizziness, tremors, seizures and headaches.  Hematological: Does not bruise/bleed easily.  Psychiatric/Behavioral: Positive for behavioral problems and confusion. Negative for hallucinations. The  patient is nervous/anxious.        Paranoia, suspicious.     Immunization History  Administered Date(s) Administered  . PPD Test 01/11/2016   Pertinent  Health Maintenance Due  Topic Date Due  . DEXA SCAN  01/18/1986  . PNA vac Low Risk Adult (1 of 2 - PCV13) 01/18/1986  . INFLUENZA VACCINE  07/26/2015   Fall Risk  04/24/2015  Falls in the past year? Yes  Number falls in past yr: 2 or more  Injury with Fall? Yes  Risk Factor Category  High Fall Risk  Risk for fall due to : History of fall(s);Impaired balance/gait  Follow up Falls evaluation completed  Functional Status Survey:    Filed Vitals:   02/15/16 1200  BP: 168/70  Pulse: 68  Temp: 97.9 F (36.6 C)  TempSrc: Oral  Resp: 16  Height:  (1.397 m)  Weight: 92 lb (41.731 kg)  SpO2: 97%   Body mass index is 21.38 kg/(m^2). Physical Exam  Constitutional: She is oriented to person, place, and time. She appears well-developed and well-nourished. No distress.  HENT:  Head: Normocephalic and atraumatic.  Right Ear: External ear normal.  Left Ear: External ear normal.  Nose: Nose normal.  Mouth/Throat: Oropharynx is clear and moist. No oropharyngeal exudate.  Eyes: Conjunctivae and EOM are normal. Pupils are equal, round, and reactive to light. Right eye exhibits no discharge. Left eye exhibits no discharge. No scleral icterus.  Neck: Normal range of motion. Neck supple. No JVD present. No tracheal deviation present. No thyromegaly present.  Cardiovascular: Intact distal pulses.  An irregular rhythm present. Bradycardia present.  Exam reveals no gallop and no friction rub.   Murmur heard.  Systolic (R & L sternal border) murmur is present with a grade of 3/6  Irregular heart beats.   Pulmonary/Chest: Effort normal. No stridor. No respiratory distress. She has no wheezes. She has no rales. She exhibits no tenderness.  Abdominal: Soft. Bowel sounds are normal. She exhibits no distension and no mass. There is no  tenderness. There is no rebound and no guarding.  Musculoskeletal: She exhibits edema and tenderness.  04/16/15 Left humerus fx, mild left arm swelling. BLE chronic edema 1+  Neurological: She is alert and oriented to person, place, and time. She has normal reflexes. She displays normal reflexes. No cranial nerve deficit. She exhibits normal muscle tone. Coordination normal.  Skin: No rash noted. She is not diaphoretic. No erythema. No pallor.  Massive ecchymoses left arm  Psychiatric: Her behavior is normal. Judgment normal.  Anxious and suspicious.     Labs reviewed:  Recent Labs  03/26/15 1617  06/10/15 0431 06/11/15 0315 11/24/15 1605 01/09/16 1557  NA 138  < > 137 136 133* 139  K 4.3  < > 3.8 4.3 4.2 4.1  CL 96  < > 97* 97* 97* 96*  CO2 29  < > 32 31  --  31  GLUCOSE 98  < > 86 100* 143* 109*  BUN 23  < > 21* 24* 17 28*  CREATININE 0.98  < > 0.80 1.02* 0.60 0.89  CALCIUM 8.8  < > 8.0* 8.1*  --  8.8*  MG 2.0  --   --   --   --   --   < > = values in this interval not displayed. No results for input(s): AST, ALT, ALKPHOS, BILITOT, PROT, ALBUMIN in the last 8760 hours.  Recent Labs  04/17/15 0137  06/09/15 1534 11/24/15 1600 11/24/15 1605 01/14/16  WBC 9.8  < > 9.9 8.0  --  9.8  NEUTROABS  --   --   --  6.1  --   --   HGB 11.0*  < > 12.1 11.9* 12.9 12.2  HCT 31.7*  < > 35.6* 35.3* 38.0 37  MCV 92.7  --  95.2 94.6  --   --   PLT 202  < > 218 204  --  298  < > = values in this interval not displayed. Lab Results  Component Value Date   TSH 5.09 01/14/2016   No results found for: HGBA1C No results found for: CHOL, HDL, LDLCALC, LDLDIRECT, TRIG, CHOLHDL  Significant Diagnostic Results in last 30 days:  No results found.  Assessment/Plan Problem List Items Addressed This Visit    Chronic diastolic congestive heart failure (HCC) (Chronic)    Clinically compensated, chronic BLE 1+ edema, continue Torsemide 10mg  daily      Lower extremity edema (Chronic)     Chronic, continue Torsemide 10mg  daily.       Paroxysmal atrial fibrillation (HCC) (Chronic)    Heart rate is in control. off Amiodarone 200mg  daily. Continue Eliquis 2.5mg  bid for thromboembolism risk reduction.       HTN (hypertension) (Chronic)    -HTN: patient to follow low sodium diet -continue Torsemide 10mg , Lisinopril 5mg .       Hypothyroidism    05/17/15 TSH 17.532. 01/14/16 TSH 5.086 Takes Levothyroxine , update TSH 4 week      Acute bronchitis - Primary    Improved, completed Z-pk, 7 day course of Augmentin, Medrol dose pk.       Paranoia (psychosis) (HCC)    Progressive vascular dementia, recent acute bronchitis, or AR of Amiodarone, dc Amiodarone per POA and patient request, obtain MMSE, CBC, CMP          Family/ staff Communication: observe the patient.   Labs/tests ordered:  CBC, CMP, MMSE

## 2016-02-15 NOTE — Assessment & Plan Note (Signed)
Heart rate is in control. off Amiodarone  daily. Continue Eliquis 2.5mg  bid for thromboembolism risk reduction.

## 2016-02-15 NOTE — Assessment & Plan Note (Signed)
Progressive vascular dementia, recent acute bronchitis, or AR of Amiodarone, dc Amiodarone per POA and patient request, obtain MMSE, CBC, CMP

## 2016-02-15 NOTE — Assessment & Plan Note (Signed)
Chronic, continue Torsemide 10mg daily.  

## 2016-02-15 NOTE — Assessment & Plan Note (Signed)
Improved, completed Z-pk, 7 day course of Augmentin, Medrol dose pk.

## 2016-02-15 NOTE — Assessment & Plan Note (Signed)
05/17/15 TSH 17.532. 01/14/16 TSH 5.086 Takes Levothyroxine , update TSH 4 week

## 2016-02-15 NOTE — Assessment & Plan Note (Signed)
Clinically compensated, chronic BLE 1+ edema, continue Torsemide  daily

## 2016-02-17 LAB — HEPATIC FUNCTION PANEL
ALK PHOS: 122 U/L (ref 25–125)
ALT: 44 U/L — AB (ref 7–35)
AST: 25 U/L (ref 13–35)
Bilirubin, Total: 0.9 mg/dL

## 2016-02-17 LAB — CBC AND DIFFERENTIAL
HCT: 41 % (ref 36–46)
Hemoglobin: 13.8 g/dL (ref 12.0–16.0)
PLATELETS: 343 10*3/uL (ref 150–399)
WBC: 13 10*3/mL

## 2016-02-17 LAB — BASIC METABOLIC PANEL
BUN: 34 mg/dL — AB (ref 4–21)
CREATININE: 1 mg/dL (ref 0.5–1.1)
GLUCOSE: 89 mg/dL
POTASSIUM: 4.5 mmol/L (ref 3.4–5.3)
Sodium: 137 mmol/L (ref 137–147)

## 2016-02-18 ENCOUNTER — Encounter: Payer: Self-pay | Admitting: Nurse Practitioner

## 2016-02-18 ENCOUNTER — Non-Acute Institutional Stay (SKILLED_NURSING_FACILITY): Payer: Medicare PPO | Admitting: Internal Medicine

## 2016-02-18 DIAGNOSIS — R600 Localized edema: Secondary | ICD-10-CM | POA: Diagnosis not present

## 2016-02-18 DIAGNOSIS — R7989 Other specified abnormal findings of blood chemistry: Secondary | ICD-10-CM | POA: Diagnosis not present

## 2016-02-18 DIAGNOSIS — J209 Acute bronchitis, unspecified: Secondary | ICD-10-CM | POA: Diagnosis not present

## 2016-02-18 DIAGNOSIS — I5032 Chronic diastolic (congestive) heart failure: Secondary | ICD-10-CM

## 2016-02-18 DIAGNOSIS — I10 Essential (primary) hypertension: Secondary | ICD-10-CM | POA: Diagnosis not present

## 2016-02-18 DIAGNOSIS — R531 Weakness: Secondary | ICD-10-CM | POA: Diagnosis not present

## 2016-02-18 DIAGNOSIS — N189 Chronic kidney disease, unspecified: Secondary | ICD-10-CM | POA: Insufficient documentation

## 2016-02-18 DIAGNOSIS — I480 Paroxysmal atrial fibrillation: Secondary | ICD-10-CM | POA: Diagnosis not present

## 2016-02-18 DIAGNOSIS — S42292S Other displaced fracture of upper end of left humerus, sequela: Secondary | ICD-10-CM | POA: Diagnosis not present

## 2016-02-18 DIAGNOSIS — K900 Celiac disease: Secondary | ICD-10-CM

## 2016-02-18 DIAGNOSIS — R945 Abnormal results of liver function studies: Secondary | ICD-10-CM

## 2016-02-18 DIAGNOSIS — K9041 Non-celiac gluten sensitivity: Secondary | ICD-10-CM

## 2016-02-18 NOTE — Progress Notes (Signed)
Patient ID: Melanie Obrien, female   DOB: October 30, 1919, 82 y.o.   MRN: [id number]   History and Physical    Provider:  Murray Hodgkins, M.D. Location:  [facility] Room Number: N 31 Place of Service:  SNF (31)  PCP: Gaye Alken, MD Patient Care Team: Juluis Rainier, MD as PCP - General (Family Medicine) Duke Salvia, MD as Consulting Physician (Cardiology) Corky Crafts, MD as Consulting Physician (Interventional Cardiology) Sheral Apley, MD as Attending Physician (Orthopedic Surgery)  Extended Emergency Contact Information Primary Emergency Contact: McNeill,Dr. Robin Searing [place] Home Phone: [phone] Work Phone: [phone] Mobile Phone: [phone] Relation: Daughter  Code Status: Full code Goals of Care: Advanced Directive information Advanced Directives 02/18/2016  Does patient have an advance directive? Yes  Type of Estate agent of [place];Living will  Does patient want to make changes to advanced directive? No - Patient declined  Copy of advanced directive(s) in chart? Yes      Chief Complaint  Patient presents with  . New Admit To SNF    From independent apartment due to declining ability to care for herself    HPI: Patient is a 82 y.o. female seen today for admission to [facility] SNF. Patient was admitted 01/11/2016 to the skilled facility from her apartment due to declining ability to care for self. Director of nursing informs me that patient was not dressing herself, taking care of meals, taking care of housekeeping, and doing many of the things that allow people to be self-sufficient.  Recently there been some mental changes that were blamed on amiodarone which was started for paroxysmal atrial fibrillation area since stopping this drug she does seem to be doing somewhat better with improvement in appetite.   Her generalized weakness and lack of self-sufficiency cause the  transfer to the skilledd facility. She has hopes that she will be able to return back to her apartment in the independent living area.   Since being in the SNF, she has had a respiratory infection which was treated with Z-Pak and then Augmentin. She believes it is getting better now. Her cough is much improved. She is afebrile.  Heart rhythms remained under control. Hypothyroidism is compensated. Edema is controlled with torsemide.  Past Medical History  Diagnosis Date  . Sinus bradycardia   . TIA (transient ischemic attack) 2000's X 1  . Trigeminal neuralgia of left side of face 12/13/2014  . Acute on chronic diastolic heart failure (HCC)   . Paroxysmal atrial fibrillation (HCC)   . Unstable gait    . Idiopathic scoliosis    . Gluten intolerance    . Complication of anesthesia     "30 minutes violent shaking after a dental procedure"   . Heart murmur   . Pneumonia 2011; 11/2014  . Pleural effusion 2011    "hugh w/pneumonia"  . Hypothyroidism     "from the aminodarone"  . Degenerative disc disease, lumbar    . Arthritis     "plenty" (06/17/2015)  . Chronic back pain   . AICD (automatic cardioverter/defibrillator) present   . Stroke [facility])     TIA  . CHF (congestive heart failure) (HCC)   . Atrial fibrillation (HCC)   . Hypertension   . Thyroid disease   . Diastolic heart failure (HCC)   . Osteoporosis   . Chronic kidney disease     02/17/16 Bun 34, creat 1.05   Past Surgical History  Procedure Laterality  Date  . Tonsillectomy  1944  . Toenail avulsion Left     2nd digit  . Loop recorder implant  08/2013    explanted 06/17/2015  . Cataract extraction w/ intraocular lens  implant, bilateral Bilateral 1991-1992  . Bi-ventricular implantable cardioverter defibrillator  (crt-d)  06/17/2015  . Ep implantable device N/A 06/17/2015    Procedure: Pacemaker Implant;  Surgeon: Duke Salvia, MD;  Location: [facility] INVASIVE CV LAB;  Service: Cardiovascular;  Laterality: N/A;  . Pacemaker  insertion      reports that she has never smoked. She does not have any smokeless tobacco history on file. She reports that she does not drink alcohol or use illicit drugs. Social History   Social History  . Marital Status: Unknown    Spouse Name: N/A  . Number of Children: N/A  . Years of Education: N/A   Occupational History  . Not on file.   Social History Main Topics  . Smoking status: Never Smoker   . Smokeless tobacco: Not on file  . Alcohol Use: No  . Drug Use: No  . Sexual Activity: Not on file   Other Topics Concern  . Not on file   Social History Narrative   ** Merged History Encounter **        Functional Status Survey: Is the patient deaf or have difficulty hearing?: Yes Does the patient have difficulty seeing, even when wearing glasses/contacts?: No Does the patient have difficulty concentrating, remembering, or making decisions?: Yes Does the patient have difficulty walking or climbing stairs?: Yes Does the patient have difficulty dressing or bathing?: Yes Does the patient have difficulty doing errands alone such as visiting a doctor's office or shopping?: Yes  Family History  Problem Relation Age of Onset  . Hypertension Father     Health Maintenance  Topic Date Due  . TETANUS/TDAP  01/19/1940  . ZOSTAVAX  01/18/1981  . DEXA SCAN  01/18/1986  . PNA vac Low Risk Adult (1 of 2 - PCV13) 01/18/1986  . INFLUENZA VACCINE  07/26/2015    Allergies  Allergen Reactions  . Codeine Other (See Comments)    unknown  . Codeine Nausea And Vomiting    & headaches  . Levaquin [Levofloxacin In D5w] Itching  . Levaquin [Levofloxacin In D5w] Swelling    & redness  . Loratadine     TACHYCARDIA  . Xylocaine [Lidocaine Hcl] Other (See Comments)    Uncontrolled shaking  . Xylocaine [Lidocaine Hcl] Other (See Comments)    Made her very cold & lots of shaking. Pt and daughter stated that she can take it.  . Loratadine Palpitations      Medication List         This list is accurate as of: 02/18/16  5:55 PM.  Always use your most recent med list.               acetaminophen 325 MG tablet  Commonly known as:  TYLENOL  Take 650 mg by mouth every 4 (four) hours as needed for mild pain or moderate pain.     apixaban 2.5 MG Tabs tablet  Commonly known as:  ELIQUIS  Take 1 tablet (2.5 mg total) by mouth 2 (two) times daily.     fexofenadine 180 MG tablet  Commonly known as:  ALLEGRA  Take 1 tablet (180 mg total) by mouth daily.     levothyroxine 88 MCG tablet  Commonly known as:  SYNTHROID, LEVOTHROID  Take 88 mcg by mouth  daily before breakfast. Reported on 02/04/2016     lisinopril 5 MG tablet  Commonly known as:  PRINIVIL,ZESTRIL  Take 1 tablet (5 mg total) by mouth daily.     torsemide 10 MG tablet  Commonly known as:  DEMADEX  Take 10 mg by mouth daily.        Review of Systems  Constitutional: Negative for fever, chills and diaphoresis.  HENT: Positive for hearing loss. Negative for ear discharge, ear pain, nosebleeds, sore throat and tinnitus.        Rhinitis.   Eyes: Negative for photophobia, pain, discharge and redness.  Respiratory: Positive for cough and shortness of breath. Negative for wheezing and stridor.        Improved.   Cardiovascular: Positive for leg swelling.       Chronic edema BLE 1+  Gastrointestinal: Negative for nausea, vomiting, abdominal pain, diarrhea, constipation and blood in stool.       Patient states she has been gluten intolerant for the last couple of years.  Endocrine: Negative for polydipsia.  Genitourinary: Positive for frequency. Negative for dysuria, urgency, hematuria and flank pain.  Musculoskeletal: Negative for myalgias, back pain and neck pain.       Old Left humerus head fx. She denies residual pain. Limited overhead ROM. Ambulates with walker.   Skin: Negative for rash.  Allergic/Immunologic: Positive for environmental allergies.  Neurological: Positive for weakness. Negative for  dizziness, tremors, seizures and headaches.  Hematological: Does not bruise/bleed easily.  Psychiatric/Behavioral: Positive for behavioral problems and confusion. Negative for hallucinations. The patient is nervous/anxious.        Paranoia, suspicious.     Filed Vitals:   02/18/16 1746  BP: 122/66  Pulse: 68  Temp: 98.1 F (36.7 C)  Resp: 16  Height: 4\' 5"  (1.346 m)  Weight: 85 lb (38.556 kg)  SpO2: 97%   Body mass index is 21.28 kg/(m^2). Physical Exam  Constitutional: She is oriented to person, place, and time. She appears well-developed and well-nourished. No distress.  HENT:  Head: Normocephalic and atraumatic.  Right Ear: External ear normal.  Left Ear: External ear normal.  Nose: Nose normal.  Mouth/Throat: Oropharynx is clear and moist. No oropharyngeal exudate.  Eyes: Conjunctivae and EOM are normal. Pupils are equal, round, and reactive to light. Right eye exhibits no discharge. Left eye exhibits no discharge. No scleral icterus.  Neck: Normal range of motion. Neck supple. No JVD present. No tracheal deviation present. No thyromegaly present.  Cardiovascular: Regular rhythm and intact distal pulses.  Bradycardia present.  Exam reveals no gallop and no friction rub.   Murmur heard.  Systolic (R & L sternal border) murmur is present with a grade of 3/6  Pulmonary/Chest: Effort normal. No stridor. No respiratory distress. She has no wheezes. She has no rales. She exhibits no tenderness.  Abdominal: Soft. Bowel sounds are normal. She exhibits no distension and no mass. There is no tenderness. There is no rebound and no guarding.  Musculoskeletal: She exhibits edema and tenderness.  On 04/16/15 Left humerus fx.  BLE chronic edema 1+  Neurological: She is alert and oriented to person, place, and time. She has normal reflexes. She displays normal reflexes. No cranial nerve deficit. She exhibits normal muscle tone. Coordination normal.  Skin: No rash noted. She is not diaphoretic.  No erythema. No pallor.  Psychiatric: Her behavior is normal. Judgment normal.  Anxious and suspicious.     Labs reviewed: Basic Metabolic Panel:  Recent Labs  16/10/96  1617  06/10/15 0431 06/11/15 0315 11/24/15 1605 01/09/16 1557 02/17/16  NA 138  < > 137 136 133* 139 137  K 4.3  < > 3.8 4.3 4.2 4.1 4.5  CL 96  < > 97* 97* 97* 96*  --   CO2 29  < > 32 31  --  31  --   GLUCOSE 98  < > 86 100* 143* 109*  --   BUN 23  < > 21* 24* 17 28* 34*  CREATININE 0.98  < > 0.80 1.02* 0.60 0.89 1.0  CALCIUM 8.8  < > 8.0* 8.1*  --  8.8*  --   MG 2.0  --   --   --   --   --   --   < > = values in this interval not displayed. Liver Function Tests:  Recent Labs  02/17/16  AST 25  ALT 44*  ALKPHOS 122   No results for input(s): LIPASE, AMYLASE in the last 8760 hours. No results for input(s): AMMONIA in the last 8760 hours. CBC:  Recent Labs  04/17/15 0137  06/09/15 1534 11/24/15 1600 11/24/15 1605 01/14/16 02/17/16  WBC 9.8  < > 9.9 8.0  --  9.8 13.0  NEUTROABS  --   --   --  6.1  --   --   --   HGB 11.0*  < > 12.1 11.9* 12.9 12.2 13.8  HCT 31.7*  < > 35.6* 35.3* 38.0 37 41  MCV 92.7  --  95.2 94.6  --   --   --   PLT 202  < > 218 204  --  298 343  < > = values in this interval not displayed. Cardiac Enzymes:  Recent Labs  06/09/15 1840  TROPONINI <0.03   BNP: Invalid input(s): POCBNP No results found for: HGBA1C Lab Results  Component Value Date   TSH 5.09 01/14/2016   Assessment/Plan 1. Paroxysmal atrial fibrillation (HCC) Currently in normal sinus rhythm. She is now off amiodarone. The drug was discontinued due to the suspicion that it was affecting her appetite as well as some emotional state. She believes she is doing better off of this medication.  2. Chronic diastolic congestive heart failure (HCC) Compensated   3. Essential hypertension Controlled  4. Acute bronchitis, unspecified organism Resolved  5. Weakness Improving  6. Gluten  intolerance Patient has explosive bowel movements from time to time that she believes is related to this problem. She describes several days of this problem over last week and the weekend. She says stools have started to firm up today.  7. Elevated LFTs Mild elevations in AST and ALT that seem basically unchanged over time  8. Fracture of humeral head, left, closed, sequela No residual pain or other problems related to this  9. Bilateral edema of lower extremity Controlled with torsemide  Although patient would like very much to return to her apartment, she agrees that safety is the dominating concern. She says she will not return if it appears that she is not safe and capable enough to resume safe independent living.

## 2016-02-24 LAB — TSH: TSH: 3.83 u[IU]/mL (ref 0.41–5.90)

## 2016-03-10 ENCOUNTER — Telehealth: Payer: Self-pay | Admitting: Cardiology

## 2016-03-10 ENCOUNTER — Telehealth: Payer: Self-pay | Admitting: Internal Medicine

## 2016-03-10 NOTE — Telephone Encounter (Signed)
Follow Up:   Returning your call,she may be in a conference when you call back. She said you could leave her a direct number to you please.

## 2016-03-10 NOTE — Telephone Encounter (Signed)
LMOVM for pt to return call in regards to home monitor.  

## 2016-03-10 NOTE — Telephone Encounter (Signed)
Spoke w/ pt daughter and she informed me that pt had her loop recorder explanted in June. Un enrolled pt from [facility] and informed pt daughter that I would do so. She verbalized understanding.

## 2016-03-21 ENCOUNTER — Encounter: Payer: Self-pay | Admitting: Interventional Cardiology

## 2016-03-21 ENCOUNTER — Ambulatory Visit (INDEPENDENT_AMBULATORY_CARE_PROVIDER_SITE_OTHER): Payer: Medicare PPO | Admitting: Interventional Cardiology

## 2016-03-21 VITALS — BP 150/52 | HR 60 | Ht <= 58 in | Wt 94.0 lb

## 2016-03-21 DIAGNOSIS — R600 Localized edema: Secondary | ICD-10-CM | POA: Diagnosis not present

## 2016-03-21 DIAGNOSIS — I10 Essential (primary) hypertension: Secondary | ICD-10-CM

## 2016-03-21 DIAGNOSIS — I480 Paroxysmal atrial fibrillation: Secondary | ICD-10-CM | POA: Diagnosis not present

## 2016-03-21 DIAGNOSIS — I5032 Chronic diastolic (congestive) heart failure: Secondary | ICD-10-CM

## 2016-03-21 DIAGNOSIS — Z7189 Other specified counseling: Secondary | ICD-10-CM

## 2016-03-21 MED ORDER — METOPROLOL TARTRATE 25 MG PO TABS: 25.0000 mg | ORAL_TABLET | Freq: Two times a day (BID) | ORAL | Status: DC

## 2016-03-21 NOTE — Patient Instructions (Signed)
Medication Instructions:  Start taking Metoprolol 25 mg twice daily-all of your other medications remain the same.  Labwork: None  Testing/Procedures: None  Follow-Up: Your physician wants you to follow-up in: 10 months. You will receive a reminder letter in the mail two months in advance. If you don't receive a letter, please call our office to schedule the follow-up appointment.     If you need a refill on your cardiac medications before your next appointment, please call your pharmacy.

## 2016-03-21 NOTE — Progress Notes (Signed)
Patient ID: Melanie Obrien, female   DOB: 21-Jun-1919, 82 y.o.   MRN: [id number]     Cardiology Office Note   Date:  03/21/2016   ID:  Melanie Obrien, DOB August 01, 1919, MRN [id number]  PCP:  Gaye Alken, MD    No chief complaint on file. AFib, Diastolic heart failure   Wt Readings from Last 3 Encounters:  03/21/16 94 lb (42.638 kg)  02/18/16 85 lb (38.556 kg)  02/15/16 92 lb (41.731 kg)       History of Present Illness: Melanie Obrien is a 82 y.o. female  who has had PAF and diastolic heart failure. SHe was admitted and diuresed in the hospital in Dec 2015.  Since that time, she has had a pacemaker implantation in June 2016. She has been treated with apixaban for her atrial fibrillation and her Plavix was stopped.  Long periods of atrial fibrillation have been a cause of her going in the diastolic heart failure in the past. Maintaining sinus rhythm was thought to be helpful in preventing this. Amiodarone was started.  Several weeks ago, it was noted that she had some increasing confusion. It was thought to be related to her amiodarone. Amiodarone was stopped.  Since that time, her confusion has improved and her volume status has been stable. She diureses well with torsemide, better than she did with furosemide. She is not having any bleeding issues.   No shortness of breath.    Past Medical History  Diagnosis Date  . Sinus bradycardia   . TIA (transient ischemic attack) 2000's X 1  . Trigeminal neuralgia of left side of face 12/13/2014  . Acute on chronic diastolic heart failure (HCC)   . Paroxysmal atrial fibrillation (HCC)   . Unstable gait    . Idiopathic scoliosis    . Gluten intolerance    . Complication of anesthesia     "30 minutes violent shaking after a dental procedure"   . Heart murmur   . Pneumonia 2011; 11/2014  . Pleural effusion 2011    "hugh w/pneumonia"  . Hypothyroidism     "from the aminodarone"  . Degenerative disc disease, lumbar    .  Arthritis     "plenty" (06/17/2015)  . Chronic back pain   . AICD (automatic cardioverter/defibrillator) present   . Stroke [facility])     TIA  . CHF (congestive heart failure) (HCC)   . Atrial fibrillation (HCC)   . Hypertension   . Thyroid disease   . Diastolic heart failure (HCC)   . Osteoporosis   . Chronic kidney disease     02/17/16 Bun 34, creat 1.05    Past Surgical History  Procedure Laterality Date  . Tonsillectomy  1944  . Toenail avulsion Left     2nd digit  . Loop recorder implant  08/2013    explanted 06/17/2015  . Cataract extraction w/ intraocular lens  implant, bilateral Bilateral 1991-1992  . Bi-ventricular implantable cardioverter defibrillator  (crt-d)  06/17/2015  . Ep implantable device N/A 06/17/2015    Procedure: Pacemaker Implant;  Surgeon: Duke Salvia, MD;  Location: [facility] INVASIVE CV LAB;  Service: Cardiovascular;  Laterality: N/A;  . Pacemaker insertion       Current Outpatient Prescriptions  Medication Sig Dispense Refill  . acetaminophen (TYLENOL) 325 MG tablet Take 650 mg by mouth every 4 (four) hours as needed for mild pain or moderate pain.    Marland Kitchen apixaban (ELIQUIS) 2.5 MG TABS tablet Take 1 tablet (2.5 mg  total) by mouth 2 (two) times daily. 60 tablet 5  . fexofenadine (ALLEGRA) 180 MG tablet Take 1 tablet (180 mg total) by mouth daily. 30 tablet 3  . levothyroxine (SYNTHROID, LEVOTHROID) 88 MCG tablet Take 88 mcg by mouth daily before breakfast. Reported on 02/04/2016    . lisinopril (PRINIVIL,ZESTRIL) 5 MG tablet Take 1 tablet (5 mg total) by mouth daily. 30 tablet 11  . torsemide (DEMADEX) 10 MG tablet Take 10 mg by mouth daily. IF WEIGHT IS UP OVER 2 LBS FOR 2 DAYS TAKE UP TO 20 MG EVERYDAY FOR 2 DAYS     No current facility-administered medications for this visit.    Allergies:   Codeine; Codeine; Levaquin; Levaquin; Loratadine; Xylocaine; Xylocaine; and Loratadine    Social History:  The patient  reports that she has never smoked. She does not  have any smokeless tobacco history on file. She reports that she does not drink alcohol or use illicit drugs.   Family History:  The patient's family history includes Hypertension in her father; Stroke in her mother. There is no history of Heart attack.    ROS:  Please see the history of present illness.   Otherwise, review of systems are positive for leg swelling.   All other systems are reviewed and negative.    PHYSICAL EXAM: VS:  BP 150/52 mmHg  Pulse 60  Ht  (1.346 m)  Wt 94 lb (42.638 kg)  BMI 23.53 kg/m2  SpO2 97% , BMI Body mass index is 23.53 kg/(m^2). GEN: Well nourished, well developed, in no acute distress HEENT: normal Neck: no JVD, carotid bruits, or masses Cardiac: RRR;II/VI systolic murmur, no rubs, or gallops,no edema  Respiratory:  clear to auscultation bilaterally, normal work of breathing GI: soft, nontender, nondistended, + BS MS: no deformity or atrophy Skin: warm and dry, no rash Neuro:  Strength and sensation are intact Psych: euthymic mood, full affect    Recent Labs: 03/26/2015: Magnesium 2.0 06/09/2015: B Natriuretic Peptide 369.6* 02/17/2016: ALT 44*; BUN 34*; Creatinine 1.0; Hemoglobin 13.8; Platelets 343; Potassium 4.5; Sodium 137 02/24/2016: TSH 3.83   Lipid Panel No results found for: CHOL, TRIG, HDL, CHOLHDL, VLDL, LDLCALC, LDLDIRECT   Other studies Reviewed: Additional studies/ records that were reviewed today with results demonstrating: Pacer checked from September reviewed.   ASSESSMENT AND PLAN:  1. Chronic diastolic heart failure: Appears euvolemic. As the amiodarone wears out of her system, and if her heart rate goes up, she may develop more heart failure symptoms. This is how her heart failure developed in the past. We'll start metoprolol 25 mg by mouth twice a day. 2. HTN: Blood pressure still high at times. Will add metoprolol 25 mg by mouth twice a day. If readings continue to be high, she will call in. We would likely increase her  lisinopril from 5 mg to 10 mg. 3. End of life care: Yellow out of facility DO NOT RESUSCITATE form filled out. The patient does not want any heroic measures. I think this is reasonable. It would be unlikely that she would make a meaningful recovery if she had a cardiac arrest requiring CPR and intubation. 4. Lower extremity edema:  She is to elevate her legs as much as possible. 5. Atrial fibrillation: Low-dose Eliquis for stroke prevention. No bleeding problems. Starting metoprolol.   Current medicines are reviewed at length with the patient today.  The patient concerns regarding her medicines were addressed.  The following changes have been made:  Metoprolol added  Labs/ tests ordered today include:  No orders of the defined types were placed in this encounter.    Recommend 150 minutes/week of aerobic exercise Low fat, low carb, high fiber diet recommended  Disposition:   FU in January 2018. She will follow-up with Dr. Graciela HusbandsKlein in July 2017.   Delorise JacksonSigned, Garry Nicolini S., MD  03/21/2016 10:14 AM    [facility] [address], [place], [place]  [address] Phone: [phone]) [phone]; Fax: [phone]) [phone]

## 2016-03-28 ENCOUNTER — Telehealth: Payer: Self-pay | Admitting: Cardiology

## 2016-03-28 ENCOUNTER — Encounter: Payer: Medicare PPO | Admitting: *Deleted

## 2016-03-28 NOTE — Telephone Encounter (Signed)
LMOVM reminding pt to send remote transmission.   

## 2016-03-31 ENCOUNTER — Encounter: Payer: Self-pay | Admitting: Cardiology

## 2016-05-03 NOTE — Progress Notes (Signed)
This encounter was created in error - please disregard.

## 2016-05-04 ENCOUNTER — Encounter: Payer: Self-pay | Admitting: Nurse Practitioner

## 2016-06-18 IMAGING — DX DG CHEST 2V
2 series · 2 of 2 positions shown · non-contrast
Comparison: None.

CLINICAL DATA: Chest pain for 1 day

EXAM:
CHEST  2 VIEW

[w chest lat]
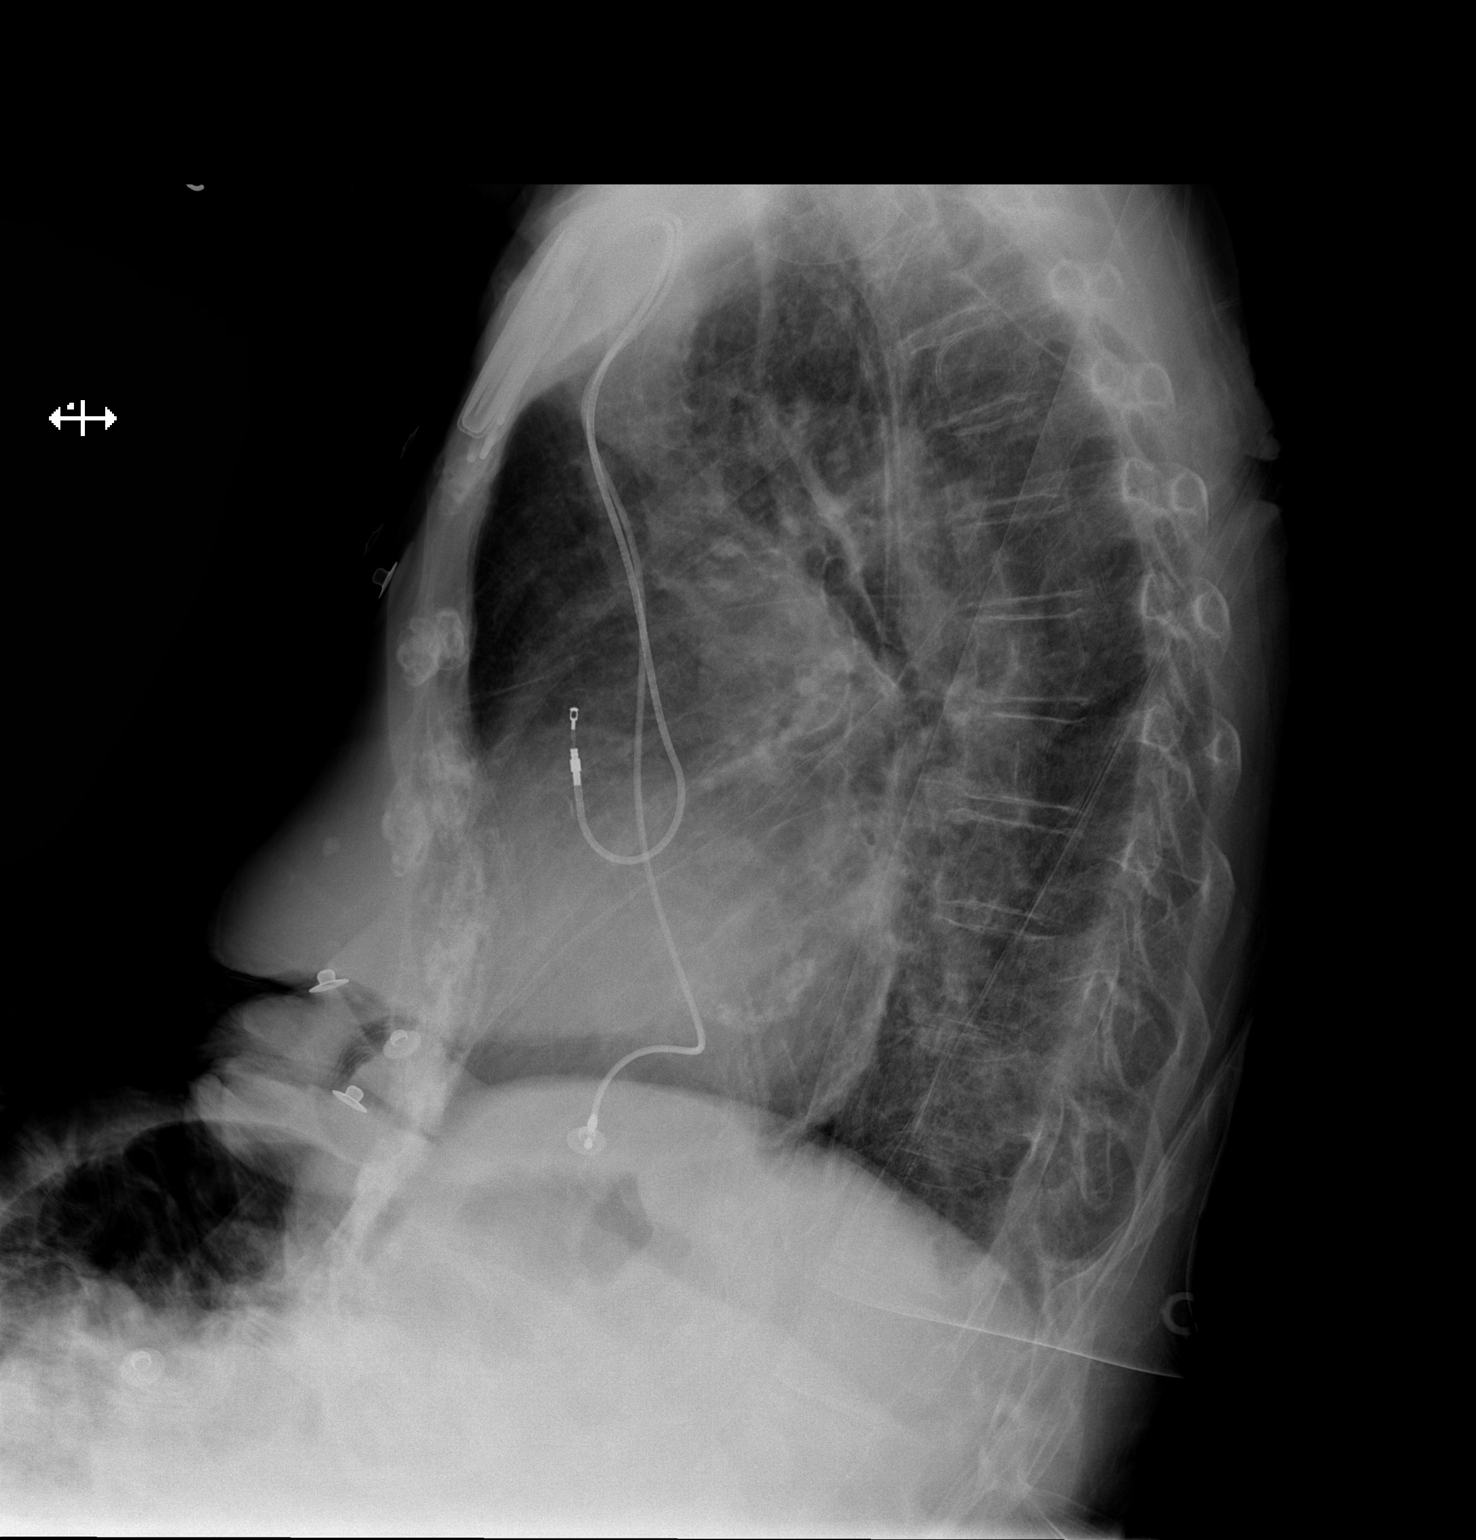

[x chest ap]
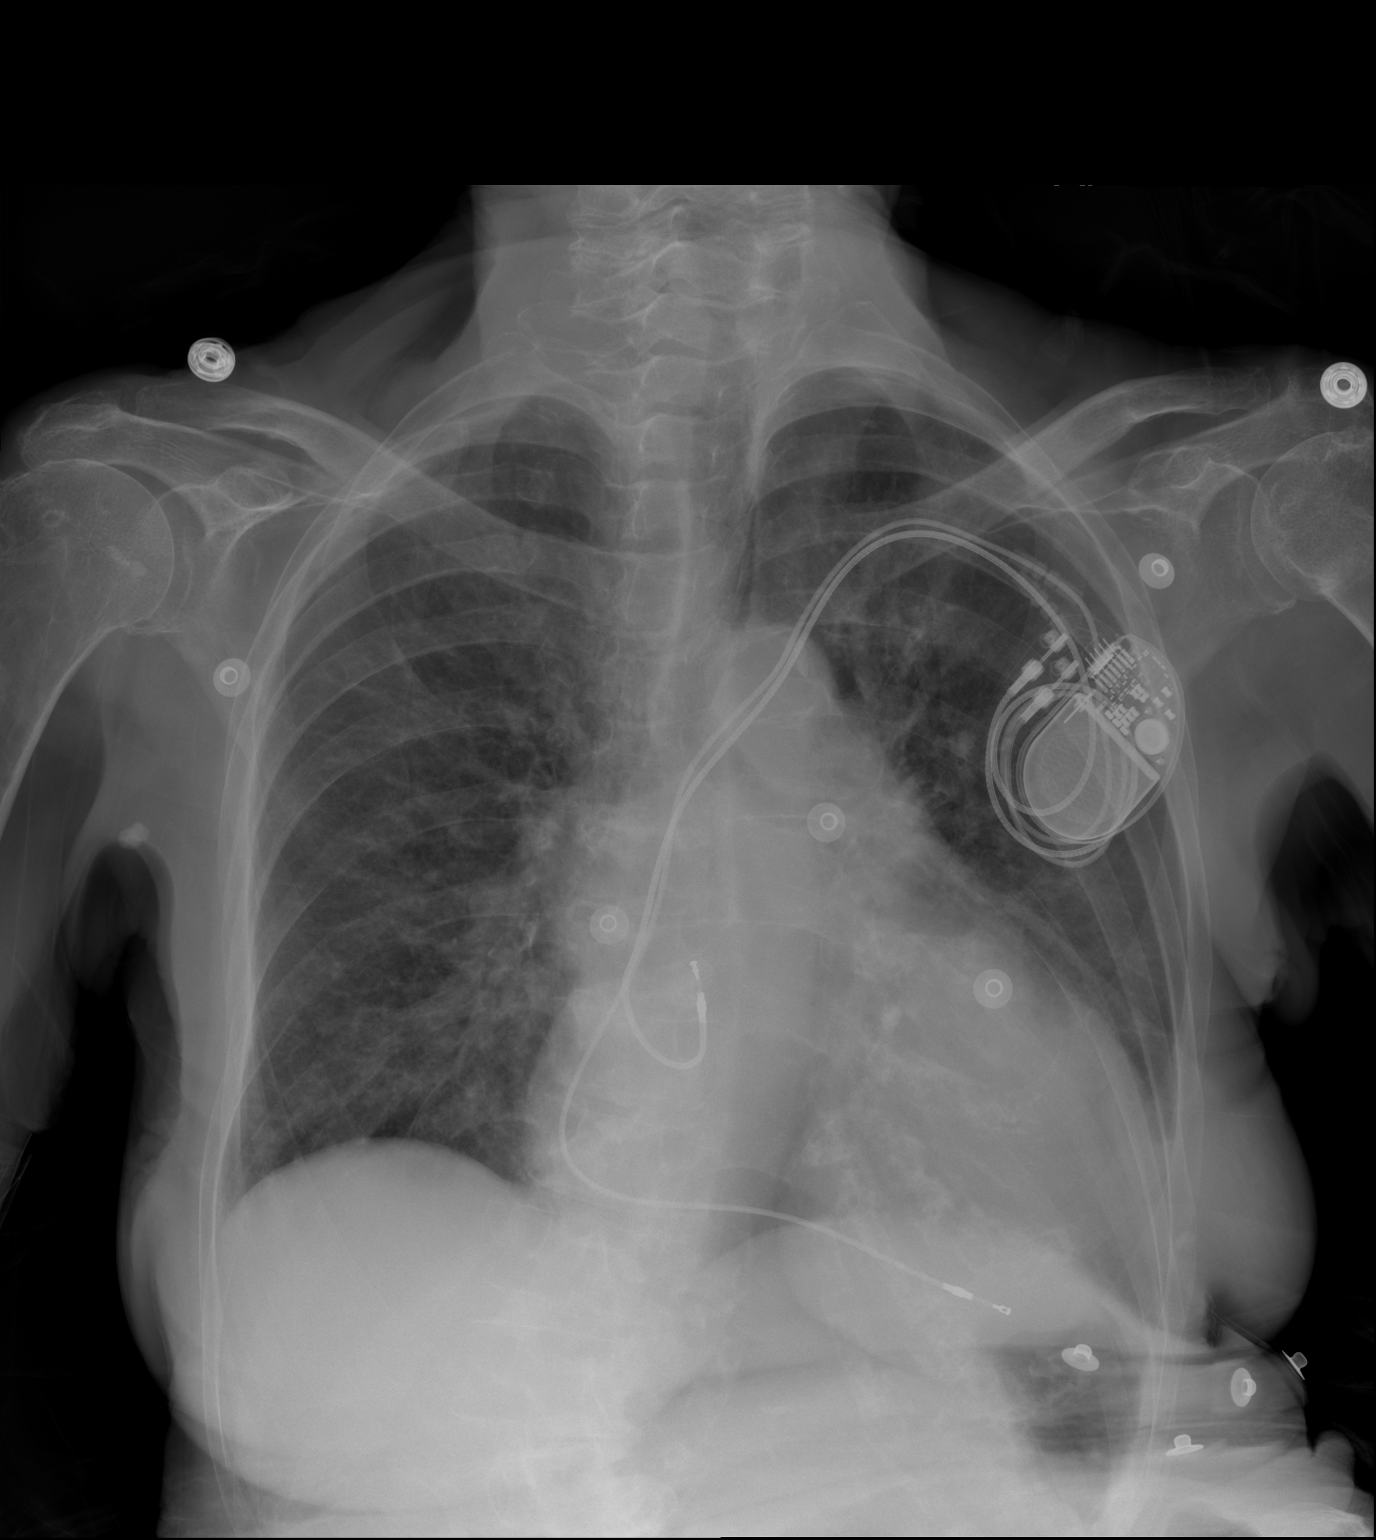

[2 of 2 positions shown; findings below may reference images not displayed]

FINDINGS: There is mild scarring in each mid lung region. There is no edema or
consolidation. Heart is borderline enlarged with pulmonary
vascularity within normal limits. There is calcification in the
mitral annulus. Pacemaker leads are attached to the right atrium and
middle cardiac vein. No adenopathy. No pneumothorax. No bone
lesions. There are foci of calcification in each carotid artery.
IMPRESSION: No edema or consolidation. Mild scarring in each mid lung region.
Heart borderline enlarged. Pacemaker leads as described. Scattered
foci of carotid artery calcification bilaterally.

## 2016-07-04 ENCOUNTER — Encounter: Payer: Self-pay | Admitting: Internal Medicine

## 2016-07-04 ENCOUNTER — Ambulatory Visit (INDEPENDENT_AMBULATORY_CARE_PROVIDER_SITE_OTHER): Payer: Medicare PPO | Admitting: Internal Medicine

## 2016-07-04 VITALS — BP 156/56 | HR 62 | Ht <= 58 in | Wt 96.2 lb

## 2016-07-04 DIAGNOSIS — I5032 Chronic diastolic (congestive) heart failure: Secondary | ICD-10-CM

## 2016-07-04 DIAGNOSIS — I480 Paroxysmal atrial fibrillation: Secondary | ICD-10-CM

## 2016-07-04 DIAGNOSIS — Z45018 Encounter for adjustment and management of other part of cardiac pacemaker: Secondary | ICD-10-CM

## 2016-07-04 NOTE — Progress Notes (Signed)
Patient Care Team: Juluis RainierElizabeth Barnes, MD as PCP - General (Family Medicine) Duke SalviaSteven C Amarria Andreasen, MD as Consulting Physician (Cardiology) Corky CraftsJayadeep S Varanasi, MD as Consulting Physician (Interventional Cardiology) Sheral Apleyimothy D Murphy, MD as Attending Physician (Orthopedic Surgery)   HPI  Melanie Chancellorrene H Obrien is a 82 y.o. female Seen in follow-up for pacemaker implanted for bradycardia noted on the loop recorder.   She has a history of a TIA and atrial fibrillation detected on her monitor. She is here with her daughter Dr. Gweneth DimitriWendy McNeill.  She has a history of acute on chronic HFpEF treated with diuresis.   She noted last week significant pain in her right leg. As she examined her right leg she noticed that it was asymmetrically swollen compared to her left.  Her physical therapist suggested she take sodium chloride  She urinates modestly response to her torsemide;. We'll use Afrin a [employer]2000 Donna ECU were discussed  It was thought atrial fibrillation was contributing to her episodes of HFpEF. She was started on amiodarone. This was stopped as it was subtotally thought to be contributing to confusion.       Past Medical History  Diagnosis Date  . Sinus bradycardia   . TIA (transient ischemic attack) 2000's X 1  . Trigeminal neuralgia of left side of face 12/13/2014  . Acute on chronic diastolic heart failure (HCC)   . Paroxysmal atrial fibrillation (HCC)   . Unstable gait    . Idiopathic scoliosis    . Gluten intolerance    . Complication of anesthesia     "30 minutes violent shaking after a dental procedure"   . Heart murmur   . Pneumonia 2011; 11/2014  . Pleural effusion 2011    "hugh w/pneumonia"  . Hypothyroidism     "from the aminodarone"  . Degenerative disc disease, lumbar    . Arthritis     "plenty" (06/17/2015)  . Chronic back pain   . AICD (automatic cardioverter/defibrillator) present   . Stroke [facility](HCC)     TIA  . CHF (congestive heart failure) (HCC)   . Atrial  fibrillation (HCC)   . Hypertension   . Thyroid disease   . Diastolic heart failure (HCC)   . Osteoporosis   . Chronic kidney disease     02/17/16 Bun 34, creat 1.05    Past Surgical History  Procedure Laterality Date  . Tonsillectomy  1944  . Toenail avulsion Left     2nd digit  . Loop recorder implant  08/2013    explanted 06/17/2015  . Cataract extraction w/ intraocular lens  implant, bilateral Bilateral 1991-1992  . Bi-ventricular implantable cardioverter defibrillator  (crt-d)  06/17/2015  . Ep implantable device N/A 06/17/2015    Procedure: Pacemaker Implant;  Surgeon: Duke SalviaSteven C Ashad Fawbush, MD;  [facility];  Service: Cardiovascular;  Laterality: N/A;  . Pacemaker insertion      Current Outpatient Prescriptions  Medication Sig Dispense Refill  . acetaminophen (TYLENOL) 325 MG tablet Take 650 mg by mouth every 4 (four) hours as needed for mild pain or moderate pain.    Marland Kitchen. apixaban (ELIQUIS) 2.5 MG TABS tablet Take 1 tablet (2.5 mg total) by mouth 2 (two) times daily. 60 tablet 5  . fexofenadine (ALLEGRA) 180 MG tablet Take 1 tablet (180 mg total) by mouth daily. 30 tablet 3  . levothyroxine (SYNTHROID, LEVOTHROID) 88 MCG tablet Take 88 mcg by mouth daily before breakfast. Reported on 02/04/2016    . lisinopril (PRINIVIL,ZESTRIL) 5  MG tablet Take 1 tablet (5 mg total) by mouth daily. 30 tablet 11  . metoprolol tartrate (LOPRESSOR) 25 MG tablet Take 1 tablet (25 mg total) by mouth 2 (two) times daily. 60 tablet 12  . torsemide (DEMADEX) 10 MG tablet Take 10 mg by mouth daily. IF WEIGHT IS UP OVER 2 LBS FOR 2 DAYS TAKE UP TO 20 MG EVERYDAY FOR 2 DAYS     No current facility-administered medications for this visit.    Social History   Social History  . Marital Status: Unknown    Spouse Name: N/A  . Number of Children: N/A  . Years of Education: N/A   Occupational History  . Not on file.   Social History Main Topics  . Smoking status: Never Smoker   . Smokeless  tobacco: Not on file  . Alcohol Use: No  . Drug Use: No  . Sexual Activity: Not on file   Other Topics Concern  . Not on file   Social History Narrative   ** Merged History Encounter **         Review of Systems negative except from HPI and PMH  Physical Exam BP 156/56 mmHg  Pulse 62  Ht  (1.397 m)  Wt 96 lb 3.2 oz (43.636 kg)  BMI 22.36 kg/m2  SpO2 98% Well developed and well nourished in no acute distress HENT normal E scleral and icterus clear Neck Supple JVP 6-8 ; carotids brisk and full Clear to ausculation Device pocket well healed; without hematoma or erythema.  There is no tethering  Regular rate and rhythm, 3/6  Murmur at apex   gallops or rub Soft with active bowel sounds No clubbing cyanosis 1+ Edema left 2+ edema on the right Alert and oriented, grossly normal motor and sensory function Skin Warm and Dry  ECG demonstrates  Atrial pacing 62  Intervals 23/10/43 Axis left -67   Assessment and  Plan  Atrial fibrillation-paroxysmal  Sinus bradycardia post termination pauses  Pacemaker-[employer]. Jude  First-degree AV block/left axis deviation  chronic HFpEF  History of TIA  The patient's atrial fibrillation burden remains considerably reduced  She has asymmetric edema. This may be responsible for her leg discomfort. I've increased her torsemide to twice a day for 3 out of 5 days area

## 2016-07-04 NOTE — Patient Instructions (Signed)
Medication Instructions:  Your physician recommends that you continue on your current medications as directed. Please refer to the Current Medication list given to you today.   Labwork: None ordered   Testing/Procedures: None ordered   Follow-Up: Your physician wants you to follow-up in: 12 months with Dr Logan BoresKlein You will receive a reminder letter in the mail two months in advance. If you don't receive a letter, please call our office to schedule the follow-up appointment.   Remote monitoring is used to monitor your Pacemaker from home. This monitoring reduces the number of office visits required to check your device to one time per year. It allows us to keep an eye on the functioning of your device to ensure it is working properly. You are scheduled for a device check from home on 10/03/16. You may send your transmission at any time that day. If you have a wireless device, the transmission will be sent automatically. After your physician reviews your transmission, you will receive a postcard with your next transmission date.    Any Other Special Instructions Will Be Listed Below (If Applicable).     If you need a refill on your cardiac medications before your next appointment, please call your pharmacy.

## 2016-07-05 LAB — CUP PACEART INCLINIC DEVICE CHECK
Battery Voltage: 3.01 V
Brady Statistic RA Percent Paced: 76 %
Date Time Interrogation Session: 20170711204321
Implantable Lead Implant Date: 20160623
Implantable Lead Implant Date: 20160623
Implantable Lead Location: 753859
Lead Channel Impedance Value: 562.5 Ohm
Lead Channel Pacing Threshold Amplitude: 0.5 V
Lead Channel Pacing Threshold Amplitude: 0.5 V
Lead Channel Pacing Threshold Amplitude: 0.5 V
Lead Channel Pacing Threshold Pulse Width: 0.4 ms
Lead Channel Sensing Intrinsic Amplitude: 0.5 mV
Lead Channel Sensing Intrinsic Amplitude: 9.3 mV
Lead Channel Setting Pacing Amplitude: 2.5 V
Lead Channel Setting Pacing Pulse Width: 0.4 ms
MDC IDC LEAD LOCATION: 753860
MDC IDC LEAD MODEL: 1944
MDC IDC LEAD MODEL: 1948
MDC IDC MSMT BATTERY REMAINING LONGEVITY: 108 mo
MDC IDC MSMT LEADCHNL RA PACING THRESHOLD AMPLITUDE: 0.5 V
MDC IDC MSMT LEADCHNL RA PACING THRESHOLD PULSEWIDTH: 1 ms
MDC IDC MSMT LEADCHNL RA PACING THRESHOLD PULSEWIDTH: 1 ms
MDC IDC MSMT LEADCHNL RV IMPEDANCE VALUE: 637.5 Ohm
MDC IDC MSMT LEADCHNL RV PACING THRESHOLD PULSEWIDTH: 0.4 ms
MDC IDC SET LEADCHNL RV PACING AMPLITUDE: 2.5 V
MDC IDC SET LEADCHNL RV SENSING SENSITIVITY: 2 mV
MDC IDC STAT BRADY RV PERCENT PACED: 4.6 %
Pulse Gen Model: 2240
Pulse Gen Serial Number: 7779640

## 2016-10-03 ENCOUNTER — Ambulatory Visit (INDEPENDENT_AMBULATORY_CARE_PROVIDER_SITE_OTHER): Payer: Medicare PPO | Admitting: *Deleted

## 2016-10-03 DIAGNOSIS — I495 Sick sinus syndrome: Secondary | ICD-10-CM

## 2016-10-03 NOTE — Progress Notes (Signed)
Remote pacemaker transmission.   

## 2016-10-04 ENCOUNTER — Encounter: Payer: Self-pay | Admitting: Cardiology

## 2016-10-10 ENCOUNTER — Telehealth: Payer: Self-pay | Admitting: Interventional Cardiology

## 2016-10-10 NOTE — Telephone Encounter (Addendum)
The pts daughter, Toniann FailWendy, states that the pts weight has been "creeping up" for about a week and a half and that the pt has some edema in her feet and legs. She states that they have increased her Torsemide to 20 mg daily from 10 mg.  Toniann FailWendy is requesting an appt with Dr Eldridge DaceVaranasi to discuss the pts long term management. I offered to schedule the pt with an APP on a day that Dr Eldridge DaceVaranasi is in the office but she wants to see Dr Eldridge DaceVaranasi as he is ultimately the one she needs to talk with concerning the pts care.  Please advise if the pt can be added to your schedule soon.

## 2016-10-10 NOTE — Telephone Encounter (Signed)
New Message  Pts daughter voiced wanting someone to call her about her mom's long term management.  Pts daughter declined 11/07/16 appt with MD Eldridge DaceVaranasi and declined any APP/PA.  Please f/u with pts daughter.

## 2016-10-11 NOTE — Telephone Encounter (Signed)
I spoke to Solectron CorporationWendy McNeill.  OK to add her to my quarter day, can double book at 8 AM if that works best or add at the end of the quarter day.

## 2016-10-12 NOTE — Telephone Encounter (Signed)
Left message to call back  

## 2016-10-13 NOTE — Telephone Encounter (Signed)
LMTCB

## 2016-10-13 NOTE — Telephone Encounter (Signed)
Toniann FailWendy, the pts daughter, is in agreement with Appt for the pt to see Dr Eldridge DaceVaranasi on 10/24 at 8 am.  Appt has been scheduled.

## 2016-10-16 ENCOUNTER — Encounter: Payer: Self-pay | Admitting: Interventional Cardiology

## 2016-10-17 ENCOUNTER — Ambulatory Visit (INDEPENDENT_AMBULATORY_CARE_PROVIDER_SITE_OTHER): Payer: Medicare PPO | Admitting: Interventional Cardiology

## 2016-10-17 ENCOUNTER — Encounter: Payer: Self-pay | Admitting: Interventional Cardiology

## 2016-10-17 ENCOUNTER — Encounter (INDEPENDENT_AMBULATORY_CARE_PROVIDER_SITE_OTHER): Payer: Self-pay

## 2016-10-17 VITALS — BP 152/52 | HR 83 | Ht <= 58 in | Wt 102.8 lb

## 2016-10-17 DIAGNOSIS — I5032 Chronic diastolic (congestive) heart failure: Secondary | ICD-10-CM

## 2016-10-17 DIAGNOSIS — R600 Localized edema: Secondary | ICD-10-CM | POA: Diagnosis not present

## 2016-10-17 DIAGNOSIS — I480 Paroxysmal atrial fibrillation: Secondary | ICD-10-CM

## 2016-10-17 DIAGNOSIS — I350 Nonrheumatic aortic (valve) stenosis: Secondary | ICD-10-CM | POA: Insufficient documentation

## 2016-10-17 DIAGNOSIS — I10 Essential (primary) hypertension: Secondary | ICD-10-CM | POA: Diagnosis not present

## 2016-10-17 MED ORDER — TORSEMIDE 20 MG PO TABS: ORAL_TABLET | ORAL | 3 refills | Status: DC

## 2016-10-17 NOTE — Patient Instructions (Signed)
Medication Instructions:  Take Torsemide 20 mg twice daily for daily weight greater than 102 lbs. Take 20 mg once daily for daily weight of 102 lbs or less.  Labwork: None  Testing/Procedures: None  Follow-Up: Daughter, Toniann FailWendy, can send Dr Eldridge DaceVaranasi a message when she feels patient needs follow up.     If you need a refill on your cardiac medications before your next appointment, please call your pharmacy.

## 2016-10-17 NOTE — Progress Notes (Signed)
Patient ID: Melanie Obrien, female   DOB: [birth year], 82 y.o.   MRN: [id number]     Cardiology Office Note   Date:  10/17/2016   ID:  Melanie Chancellorrene H Whitenack, DOB [birth year], MRN [id number]  PCP:  Gaye AlkenBARNES,ELIZABETH STEWART, MD    No chief complaint on file. AFib, Diastolic heart failure   Wt Readings from Last 3 Encounters:  10/17/16 46.6 kg (102 lb 12.8 oz)  07/04/16 43.6 kg (96 lb 3.2 oz)  03/21/16 42.6 kg (94 lb)       History of Present Illness: Melanie Chancellorrene H Dickman is a 82 y.o. female  who has had PAF and diastolic heart failure. SHe was admitted and diuresed in the hospital in Dec 2015.  After that, she has had a pacemaker implantation in June 2016. She has been treated with apixaban for her atrial fibrillation and her Plavix was stopped.  Long periods of atrial fibrillation have been a cause of her going in the diastolic heart failure in the past. Maintaining sinus rhythm was thought to be helpful in preventing this. Amiodarone was started.  After starting amiodarone, it was noted that she had some increasing confusion. It was thought to be related to her amiodarone. Amiodarone was stopped.  Her confusion has improved.    Her volume status had been stable. She diureses well with torsemide, better than she did with furosemide. She is not having any bleeding issues.   No shortness of breath.  However over the past few weeks, she has had increased swelling.  Her daughter increased the torsemide  To 20 mg daily.  Labs were checked and renal function was stable.  SHe presents for f/u today.  I received a message from the daughter asking about palliative care.     The patient would like to take her vitamins.     Past Medical History:  Diagnosis Date  . Acute on chronic diastolic heart failure (HCC)   . AICD (automatic cardioverter/defibrillator) present   . Arthritis    "plenty" (06/17/2015)  . Atrial fibrillation (HCC)   . CHF (congestive heart failure) (HCC)   . Chronic back pain   .  Chronic kidney disease    02/17/16 Bun 34, creat 1.05  . Complication of anesthesia    "30 minutes violent shaking after a dental procedure"   . Degenerative disc disease, lumbar    . Diastolic heart failure (HCC)   . Gluten intolerance    . Heart murmur   . Hypertension   . Hypothyroidism    "from the aminodarone"  . Idiopathic scoliosis    . Osteoporosis   . Paroxysmal atrial fibrillation (HCC)   . Pleural effusion 2011   "hugh w/pneumonia"  . Pneumonia 2011; 11/2014  . Sinus bradycardia   . Stroke [facility](HCC)    TIA  . Thyroid disease   . TIA (transient ischemic attack) 2000's X 1  . Trigeminal neuralgia of left side of face 12/13/2014  . Unstable gait      Past Surgical History:  Procedure Laterality Date  . BI-VENTRICULAR IMPLANTABLE CARDIOVERTER DEFIBRILLATOR  (CRT-D)  06/17/2015  . CATARACT EXTRACTION W/ INTRAOCULAR LENS  IMPLANT, BILATERAL Bilateral 1991-1992  . EP IMPLANTABLE DEVICE N/A 06/17/2015   Procedure: Pacemaker Implant;  Surgeon: Duke SalviaSteven C Klein, MD;  [facility];  Service: Cardiovascular;  Laterality: N/A;  . LOOP RECORDER IMPLANT  08/2013   explanted 06/17/2015  . PACEMAKER INSERTION    . TOENAIL AVULSION Left    2nd digit  .  TONSILLECTOMY  1944     Current Outpatient Prescriptions  Medication Sig Dispense Refill  . acetaminophen (TYLENOL) 325 MG tablet Take 650 mg by mouth every 4 (four) hours as needed for mild pain or moderate pain.    Marland Kitchen apixaban (ELIQUIS) 2.5 MG TABS tablet Take 1 tablet (2.5 mg total) by mouth 2 (two) times daily. 60 tablet 5  . fexofenadine (ALLEGRA) 180 MG tablet Take 1 tablet (180 mg total) by mouth daily. 30 tablet 3  . fluticasone (FLONASE) 50 MCG/ACT nasal spray Place 1 spray into both nostrils daily.    Marland Kitchen levothyroxine (SYNTHROID, LEVOTHROID) 88 MCG tablet Take 88 mcg by mouth daily before breakfast. Reported on 02/04/2016    . lisinopril (PRINIVIL,ZESTRIL) 5 MG tablet Take 1 tablet (5 mg total) by mouth daily. 30  tablet 11  . Magnesium 250 MG TABS Take 1 tablet by mouth daily.    . metoprolol tartrate (LOPRESSOR) 25 MG tablet Take 1 tablet (25 mg total) by mouth 2 (two) times daily. 60 tablet 12  . torsemide (DEMADEX) 10 MG tablet Take 10 mg by mouth daily. IF WEIGHT IS 98 LBS  OR MORE  TAKE  20 MG EVERYDAY     No current facility-administered medications for this visit.     Allergies:   Codeine; Codeine; Levaquin [levofloxacin in d5w]; Levaquin [levofloxacin in d5w]; Loratadine; Xylocaine [lidocaine hcl]; Xylocaine [lidocaine hcl]; and Loratadine    Social History:  The patient  reports that she has never smoked. She has never used smokeless tobacco. She reports that she does not drink alcohol or use drugs.   Family History:  The patient's family history includes Hypertension in her father; Stroke in her mother.    ROS:  Please see the history of present illness.   Otherwise, review of systems are positive for leg swelling.   All other systems are reviewed and negative.    PHYSICAL EXAM: VS:  BP (!) 152/52   Pulse 83   Ht 4\' 7"  (1.397 m)   Wt 46.6 kg (102 lb 12.8 oz)   SpO2 97%   BMI 23.89 kg/m  , BMI Body mass index is 23.89 kg/m. GEN: Well nourished, well developed, in no acute distress  HEENT: normal  Neck: no JVD, carotid bruits, or masses Cardiac: RRR;III/VI systolic murmur, no rubs, or gallops,no edema  Respiratory:  clear to auscultation bilaterally, normal work of breathing GI: soft, nontender, nondistended, + BS MS: no deformity or atrophy  Skin: warm and dry, no rash Neuro:  Strength and sensation are intact Psych: euthymic mood, full affect    Recent Labs: 02/17/2016: ALT 44; BUN 34; Creatinine 1.0; Hemoglobin 13.8; Platelets 343; Potassium 4.5; Sodium 137 02/24/2016: TSH 3.83   Lipid Panel No results found for: CHOL, TRIG, HDL, CHOLHDL, VLDL, LDLCALC, LDLDIRECT   Other studies Reviewed: Additional studies/ records that were reviewed today with results demonstrating:  Pacer checked from September reviewed.   ASSESSMENT AND PLAN:  1. Chronic diastolic heart failure: Has some leg swelling. Did not tolerate amiodarone for rhythm control; was start to prevent heart failure symptoms. AFib is how her heart failure developed in the past. COntinue metoprolol 25 mg by mouth twice a day.  Increase torsemide to 20 mg BID for the next two days..  Then 20 mg daily.  Her AS murmur is worse.  This may be contributing to fluid reteniotn.  SHe would not want any invasive management so we discussed repeat echo, but the family wanted to hold off.  2. HTN: Blood pressure still high at times. Will add metoprolol 25 mg by mouth twice a day. If readings continue to be high, she will call in. We would likely increase her lisinopril from 5 mg to 10 mg if increased torsemide does not make a difference. 3. End of life care: Yellow out of facility DO NOT RESUSCITATE form filled out in 4/17. The patient does not want any heroic measures. I think this is reasonable. It would be unlikely that she would make a meaningful recovery if she had a cardiac arrest requiring CPR and intubation. 4. Lower extremity edema:  She is to elevate her legs as much as possible.  If weight > 102 lbs, take Torsemide 40 mg daily.  Otherwise 20 mg Torsemide daily.  5. Atrial fibrillation: Low-dose Eliquis for stroke prevention. No bleeding problems. Continue metoprolol. 6. Aortic stenosis: worsening murmur.   Current medicines are reviewed at length with the patient today.  The patient concerns regarding her medicines were addressed.  The following changes have been made:  Metoprolol added  Labs/ tests ordered today include:  No orders of the defined types were placed in this encounter.   Recommend 150 minutes/week of aerobic exercise Low fat, low carb, high fiber diet recommended  Disposition:   FU in 2018 based on her clinical course   Signed, Lance Muss, MD  10/17/2016 8:53 AM    [facility] [address] [place], [place], [place]  [address] Phone: [phone]; Fax: [phone]

## 2016-10-18 ENCOUNTER — Telehealth: Payer: Self-pay | Admitting: Interventional Cardiology

## 2016-10-18 NOTE — Telephone Encounter (Signed)
LMTCB

## 2016-10-18 NOTE — Telephone Encounter (Signed)
**Note De-Identified  Obfuscation** Melanie Obrien has been given the pts correct Torsemide dosing of 40 mg daily for weight greater than 120 lbs and for weight of 102 or less to take 20 mg daily. Melanie Obrien verbalized understanding.

## 2016-10-18 NOTE — Telephone Encounter (Signed)
[facility] calling to verify dosage on Torsemide, was to give 20mg  twice a day if her weight was over 102lbs, and 20mg  once a day if weight was 102lbs or less. Pls advise @ [phone]

## 2016-11-03 LAB — CUP PACEART REMOTE DEVICE CHECK
Battery Remaining Longevity: 106 mo
Battery Remaining Percentage: 95.5 %
Battery Voltage: 3.01 V
Brady Statistic AP VP Percent: 1.3 %
Brady Statistic AS VS Percent: 31 %
Implantable Lead Implant Date: 20160623
Implantable Lead Location: 753860
Implantable Lead Model: 1948
Implantable Pulse Generator Implant Date: 20160623
Lead Channel Impedance Value: 580 Ohm
Lead Channel Pacing Threshold Amplitude: 0.5 V
Lead Channel Pacing Threshold Pulse Width: 0.4 ms
Lead Channel Sensing Intrinsic Amplitude: 1 mV
Lead Channel Setting Pacing Amplitude: 2.5 V
MDC IDC LEAD IMPLANT DT: 20160623
MDC IDC LEAD LOCATION: 753859
MDC IDC LEAD MODEL: 1944
MDC IDC MSMT LEADCHNL RA PACING THRESHOLD PULSEWIDTH: 1 ms
MDC IDC MSMT LEADCHNL RV IMPEDANCE VALUE: 680 Ohm
MDC IDC MSMT LEADCHNL RV PACING THRESHOLD AMPLITUDE: 0.5 V
MDC IDC MSMT LEADCHNL RV SENSING INTR AMPL: 11.3 mV
MDC IDC PG SERIAL: 7779640
MDC IDC SESS DTM: 20171010060016
MDC IDC SET LEADCHNL RA PACING AMPLITUDE: 2.5 V
MDC IDC SET LEADCHNL RV PACING PULSEWIDTH: 0.4 ms
MDC IDC SET LEADCHNL RV SENSING SENSITIVITY: 2 mV
MDC IDC STAT BRADY AP VS PERCENT: 63 %
MDC IDC STAT BRADY AS VP PERCENT: 4.7 %
MDC IDC STAT BRADY RA PERCENT PACED: 63 %
MDC IDC STAT BRADY RV PERCENT PACED: 6 %

## 2016-12-03 ENCOUNTER — Emergency Department (HOSPITAL_COMMUNITY)
Admission: EM | Admit: 2016-12-03 | Discharge: 2016-12-03 | Disposition: A | Discharge status: Home / Self Care | Payer: Medicare PPO | Attending: Emergency Medicine | Admitting: Emergency Medicine

## 2016-12-03 ENCOUNTER — Encounter (HOSPITAL_COMMUNITY): Payer: Self-pay | Admitting: Emergency Medicine

## 2016-12-03 DIAGNOSIS — Z95810 Presence of automatic (implantable) cardiac defibrillator: Secondary | ICD-10-CM | POA: Diagnosis not present

## 2016-12-03 DIAGNOSIS — R42 Dizziness and giddiness: Secondary | ICD-10-CM | POA: Diagnosis present

## 2016-12-03 DIAGNOSIS — E039 Hypothyroidism, unspecified: Secondary | ICD-10-CM | POA: Insufficient documentation

## 2016-12-03 DIAGNOSIS — I5033 Acute on chronic diastolic (congestive) heart failure: Secondary | ICD-10-CM | POA: Diagnosis not present

## 2016-12-03 DIAGNOSIS — Z7901 Long term (current) use of anticoagulants: Secondary | ICD-10-CM | POA: Insufficient documentation

## 2016-12-03 DIAGNOSIS — N189 Chronic kidney disease, unspecified: Secondary | ICD-10-CM | POA: Insufficient documentation

## 2016-12-03 DIAGNOSIS — I130 Hypertensive heart and chronic kidney disease with heart failure and stage 1 through stage 4 chronic kidney disease, or unspecified chronic kidney disease: Secondary | ICD-10-CM | POA: Insufficient documentation

## 2016-12-03 DIAGNOSIS — Z8673 Personal history of transient ischemic attack (TIA), and cerebral infarction without residual deficits: Secondary | ICD-10-CM | POA: Diagnosis not present

## 2016-12-03 LAB — COMPREHENSIVE METABOLIC PANEL
ALBUMIN: 3.4 g/dL — AB (ref 3.5–5.0)
ALK PHOS: 91 U/L (ref 38–126)
ALT: 21 U/L (ref 14–54)
ANION GAP: 7 (ref 5–15)
AST: 37 U/L (ref 15–41)
BILIRUBIN TOTAL: 0.4 mg/dL (ref 0.3–1.2)
BUN: 22 mg/dL — ABNORMAL HIGH (ref 6–20)
CALCIUM: 8.6 mg/dL — AB (ref 8.9–10.3)
CO2: 27 mmol/L (ref 22–32)
Chloride: 97 mmol/L — ABNORMAL LOW (ref 101–111)
Creatinine, Ser: 0.75 mg/dL (ref 0.44–1.00)
GFR calc non Af Amer: >60 mL/min (ref >60)
Glucose, Bld: 147 mg/dL — ABNORMAL HIGH (ref 65–99)
POTASSIUM: 5.2 mmol/L — AB (ref 3.5–5.1)
SODIUM: 131 mmol/L — AB (ref 135–145)
TOTAL PROTEIN: 5.9 g/dL — AB (ref 6.5–8.1)

## 2016-12-03 LAB — CBC WITH DIFFERENTIAL/PLATELET
BASOS PCT: 1 %
Basophils Absolute: 0.1 10*3/uL (ref 0.0–0.1)
EOS ABS: 0.1 10*3/uL (ref 0.0–0.7)
Eosinophils Relative: 1 %
HCT: 35.2 % — ABNORMAL LOW (ref 36.0–46.0)
HEMOGLOBIN: 12 g/dL (ref 12.0–15.0)
LYMPHS ABS: 1.4 10*3/uL (ref 0.7–4.0)
Lymphocytes Relative: 17 %
MCH: 31.7 pg (ref 26.0–34.0)
MCHC: 34.1 g/dL (ref 30.0–36.0)
MCV: 92.9 fL (ref 78.0–100.0)
MONO ABS: 0.6 10*3/uL (ref 0.1–1.0)
MONOS PCT: 7 %
NEUTROS PCT: 74 %
Neutro Abs: 6.3 10*3/uL (ref 1.7–7.7)
Platelets: 214 10*3/uL (ref 150–400)
RBC: 3.79 MIL/uL — ABNORMAL LOW (ref 3.87–5.11)
RDW: 14.5 % (ref 11.5–15.5)
WBC: 8.5 10*3/uL (ref 4.0–10.5)

## 2016-12-03 LAB — URINALYSIS, ROUTINE W REFLEX MICROSCOPIC
Bilirubin Urine: NEGATIVE
Glucose, UA: NEGATIVE mg/dL
Hgb urine dipstick: NEGATIVE
KETONES UR: NEGATIVE mg/dL
LEUKOCYTES UA: NEGATIVE
NITRITE: NEGATIVE
PROTEIN: NEGATIVE mg/dL
Specific Gravity, Urine: 1.008 (ref 1.005–1.030)
pH: 8 (ref 5.0–8.0)

## 2016-12-03 LAB — PROTIME-INR
INR: 1.13
PROTHROMBIN TIME: 14.6 s (ref 11.4–15.2)

## 2016-12-03 LAB — I-STAT CG4 LACTIC ACID, ED: Lactic Acid, Venous: 2.1 mmol/L (ref 0.5–1.9)

## 2016-12-03 LAB — CBG MONITORING, ED: GLUCOSE-CAPILLARY: 97 mg/dL (ref 65–99)

## 2016-12-03 LAB — TROPONIN I: Troponin I: <0.03 ng/mL (ref <0.03)

## 2016-12-03 NOTE — ED Provider Notes (Signed)
[facility]-EMERGENCY DEPT Provider Note CSN: [id number] Arrival date & time: 12/03/16  1017  History   Chief Complaint Chief Complaint  Patient presents with  . Dizziness    HPI Melanie Obrien is a DNR 82 y.o. female with pertinent pmh of CHF (LV EF 55-65% on echo 12/14/14), atrial fibrillation on apixaban, AICD, CKD, HTN on metoprolol and lisinopril, multiple TIAs (last one 3 years ago), aortic stenosis and hypothyroidism who presents with sudden onset dizziness at 0830 today.  Pt noticed dizziness while she was seated eating breakfast.  Pt reports prior dizziness spells however today her dizziness last >1 min which is not typical.  Pt also reports her "head doesn't feel right".  Pt denies sudden onset headache, blurry vision, chest pain, shortness of breath, abdominal pain, nausea, vomiting, urinary symptoms, new extremity W/N/T.  Pt reports she had three watery BMs this morning prior to dizziness onset, she thinks she might have eaten something bad last night.  Pt has taken synthroid today, no other medications.  Pt currently denies dizziness.  Pt's daughter who is a Physician is accompanying the patient today.   HPI  Past Medical History:  Diagnosis Date  . Acute on chronic diastolic heart failure (HCC)   . AICD (automatic cardioverter/defibrillator) present   . Arthritis    "plenty" (06/17/2015)  . Atrial fibrillation (HCC)   . CHF (congestive heart failure) (HCC)   . Chronic back pain   . Chronic kidney disease    02/17/16 Bun 34, creat 1.05  . Complication of anesthesia    "30 minutes violent shaking after a dental procedure"   . Degenerative disc disease, lumbar    . Diastolic heart failure (HCC)   . Gluten intolerance    . Heart murmur   . Hypertension   . Hypothyroidism    "from the aminodarone"  . Idiopathic scoliosis    . Osteoporosis   . Paroxysmal atrial fibrillation (HCC)   . Pleural effusion 2011   "hugh w/pneumonia"  . Pneumonia 2011; 11/2014  . Sinus bradycardia    . Stroke [facility])    TIA  . Thyroid disease   . TIA (transient ischemic attack) 2000's X 1  . Trigeminal neuralgia of left side of face 12/13/2014  . Unstable gait      Patient Active Problem List   Diagnosis Date Noted  . Aortic stenosis 10/17/2016  . Chronic kidney disease   . Paranoia (psychosis) (HCC) 02/15/2016  . Acute bronchitis 02/04/2016  . Fatigue 06/17/2015  . Hypothyroidism 05/18/2015  . Unstable gait 04/24/2015  . Degenerative disc disease, lumbar 04/24/2015  . Idiopathic scoliosis 04/24/2015  . Weakness 04/24/2015  . Dyspnea 04/24/2015  . Gluten intolerance 04/24/2015  . HTN (hypertension) 04/20/2015  . Fracture of humeral head, left, closed 04/17/2015  . Fall   . Other seasonal allergic rhinitis   . Malnutrition of moderate degree (HCC) 01/21/2015  . Hepatopathy - congestive 01/20/2015  . Chronic diastolic congestive heart failure (HCC) 01/18/2015  . Lower extremity edema   . Paroxysmal atrial fibrillation (HCC)   . Elevated LFTs 01/16/2015  . Syncope 01/16/2015  . SOB (shortness of breath)   . Atrial fibrillation (HCC) 12/13/2014  . TIA (transient ischemic attack) 12/13/2014  . Trigeminal neuralgia of left side of face 12/13/2014  . Hot flashes 12/13/2014    Past Surgical History:  Procedure Laterality Date  . BI-VENTRICULAR IMPLANTABLE CARDIOVERTER DEFIBRILLATOR  (CRT-D)  06/17/2015  . CATARACT EXTRACTION W/ INTRAOCULAR LENS  IMPLANT, BILATERAL Bilateral 1991-1992  .  EP IMPLANTABLE DEVICE N/A 06/17/2015   Procedure: Pacemaker Implant;  Surgeon: Duke SalviaSteven C Klein, MD;  [facility];  Service: Cardiovascular;  Laterality: N/A;  . LOOP RECORDER IMPLANT  08/2013   explanted 06/17/2015  . PACEMAKER INSERTION    . TOENAIL AVULSION Left    2nd digit  . TONSILLECTOMY  1944    OB History    Gravida Para Term Preterm AB Living   0 0 0 0 0     SAB TAB Ectopic Multiple Live Births   0 0 0           Home Medications    Prior to Admission  medications   Medication Sig Start Date End Date Taking? Authorizing Provider  apixaban (ELIQUIS) 2.5 MG TABS tablet Take 1 tablet (2.5 mg total) by mouth 2 (two) times daily. 07/13/15  Yes Duke SalviaSteven C Klein, MD  b complex vitamins tablet Take 0.5 tablets by mouth 2 (two) times daily.   Yes Historical Provider, MD  Cholecalciferol (VITAMIN D3) 10000 units TABS Take 10,000 Units by mouth daily.   Yes Historical Provider, MD  Chromium Picolinate (CHROMIUM PICOLATE PO) Take 1 tablet by mouth daily.   Yes Historical Provider, MD  Coral Calcium 1000 (390 Ca) MG TABS Take 1,000 mg by mouth every evening.   Yes Historical Provider, MD  fexofenadine (ALLEGRA) 180 MG tablet Take 1 tablet (180 mg total) by mouth daily. 05/17/15  Yes Kimber RelicArthur G Green, MD  fluticasone (FLONASE) 50 MCG/ACT nasal spray Place 1 spray into both nostrils daily.   Yes Historical Provider, MD  levothyroxine (SYNTHROID, LEVOTHROID) 88 MCG tablet Take 88 mcg by mouth daily before breakfast. Reported on 02/04/2016   Yes Historical Provider, MD  lisinopril (PRINIVIL,ZESTRIL) 5 MG tablet Take 1 tablet (5 mg total) by mouth daily. 12/07/15  Yes Corky CraftsJayadeep S Varanasi, MD  Magnesium 250 MG TABS Take 250 mg by mouth daily.    Yes Historical Provider, MD  metoprolol tartrate (LOPRESSOR) 25 MG tablet Take 1 tablet (25 mg total) by mouth 2 (two) times daily. 03/21/16  Yes Corky CraftsJayadeep S Varanasi, MD  PANTOTHENIC ACID PO Take 100 mg by mouth daily.   Yes Historical Provider, MD  torsemide (DEMADEX) 20 MG tablet Take 20 mg BID for daily weight greater than 102 lbs. Take 20 mg once daily for daily weight of 102 or less. 10/17/16  Yes Corky CraftsJayadeep S Varanasi, MD    Family History Family History  Problem Relation Age of Onset  . Stroke Mother   . Hypertension Father   . Heart attack Neg Hx     Social History Social History  Substance Use Topics  . Smoking status: Never Smoker  . Smokeless tobacco: Never Used  . Alcohol use No     Allergies   Codeine;  Codeine; Levaquin [levofloxacin in d5w]; Levaquin [levofloxacin in d5w]; Loratadine; Xylocaine [lidocaine hcl]; Xylocaine [lidocaine hcl]; and Loratadine   Review of Systems Review of Systems  Constitutional: Negative for appetite change, chills, diaphoresis and fever.  HENT: Positive for hearing loss. Negative for congestion and sore throat.   Eyes: Negative for visual disturbance.  Respiratory: Negative for cough, choking, chest tightness and shortness of breath.   Cardiovascular: Negative for chest pain, palpitations and leg swelling.  Gastrointestinal: Positive for diarrhea. Negative for abdominal pain, anal bleeding, blood in stool, constipation, nausea, rectal pain and vomiting.  Genitourinary: Negative for dysuria, flank pain and hematuria.  Skin: Negative for rash.  Neurological: Positive for dizziness. Negative  for tremors, seizures, syncope, facial asymmetry, weakness, light-headedness, numbness and headaches.  Hematological: Bruises/bleeds easily.  Psychiatric/Behavioral: Negative.      Physical Exam Updated Vital Signs BP 153/58   Pulse 72   Temp 97.5 F (36.4 C) (Oral)   Resp 22   SpO2 99%   Physical Exam  Constitutional: She is oriented to person, place, and time. Vital signs are normal. She appears well-developed and well-nourished. No distress.  HENT:  Head: Normocephalic and atraumatic.  Nose: Nose normal.  Mouth/Throat: Oropharynx is clear and moist. No oropharyngeal exudate.  Eyes: Conjunctivae and EOM are normal. Pupils are equal, round, and reactive to light.  No nystagmus.  Neck: Normal range of motion. Neck supple. No JVD present.  Cardiovascular: Normal rate, normal heart sounds and intact distal pulses.   Pulmonary/Chest: Effort normal and breath sounds normal. No respiratory distress.  Abdominal: Soft. She exhibits no distension. There is no tenderness.  Musculoskeletal: Normal range of motion.  Lymphadenopathy:    She has no cervical adenopathy.    Neurological: She is alert and oriented to person, place, and time.  Pt is alert and oriented.  Speech and phonation intact, no dysarthria.  Thought process coherent.  Strength 5/5 in upper and lower extremities.  Sensation to light touch intact in upper and lower extremities. Intact finger to nose test.  CN I not tested CN II full visual fields  CN III, IV, VI PEERL and EOM intact CN V light touch intact in all 3 divisions of trigeminal nerve CN VII facial nerve movements intact, symmetric CN VIII hearing intact to finger rub CN IX, X no uvula deviation, symmetric soft palate rise CN XI 5/5 SCM and trapezius strength  CN XII Tongue midline with symmetric L/R movement  Skin: Skin is warm and dry. Capillary refill takes less than 2 seconds.  Psychiatric: She has a normal mood and affect. Her behavior is normal.  Nursing note and vitals reviewed.    ED Treatments / Results  Labs (all labs ordered are listed, but only abnormal results are displayed) Labs Reviewed  URINE CULTURE - Abnormal; Notable for the following:       Result Value   Culture MULTIPLE SPECIES PRESENT, SUGGEST RECOLLECTION (*)    All other components within normal limits  CBC WITH DIFFERENTIAL/PLATELET - Abnormal; Notable for the following:    RBC 3.79 (*)    HCT 35.2 (*)    All other components within normal limits  COMPREHENSIVE METABOLIC PANEL - Abnormal; Notable for the following:    Sodium 131 (*)    Potassium 5.2 (*)    Chloride 97 (*)    Glucose, Bld 147 (*)    BUN 22 (*)    Calcium 8.6 (*)    Total Protein 5.9 (*)    Albumin 3.4 (*)    All other components within normal limits  I-STAT CG4 LACTIC ACID, ED - Abnormal; Notable for the following:    Lactic Acid, Venous 2.10 (*)    All other components within normal limits  URINALYSIS, ROUTINE W REFLEX MICROSCOPIC  TROPONIN I  PROTIME-INR  CBG MONITORING, ED    EKG  EKG Interpretation  Date/Time:  Sunday December 03 2016 10:41:18 EST Ventricular  Rate:  78 PR Interval:    QRS Duration: 106 QT Interval:  389 QTC Calculation: 444 R Axis:   -58 Text Interpretation:  Sinus or ectopic atrial rhythm Prolonged PR interval LAD, consider left anterior fascicular block Since last tracing Atrial pacing is not  seen Confirmed by [facility]  MD, ELLIOTT [phone]) on 12/03/2016 10:54:02 AM       Radiology No results found.  Procedures Procedures (including critical care time)  Medications Ordered in ED Medications - No data to display   Initial Impression / Assessment and Plan / ED Course  I have reviewed the triage vital signs and the nursing notes.  Pertinent labs & imaging results that were available during my care of the patient were reviewed by me and considered in my medical decision making (see chart for details).  Clinical Course as of Dec 06 2255  Tue Dec 05, 2016  2255 Glucose: (!) 147 [CG]    Clinical Course User Index [CG] Liberty Handy, PA-C   82 yo female with pmh as noted above presents with dizziness x 1 min this morning while she was seated eating breakfast.  Pt has had similar symptoms in the past, however, today pt states dizziness lasted longer than usual.  Pt has otherwise been feeling well, denies any symptoms during ED stay other than vague head discomfort ("my head doesn't feel right").  On exam pt is pleasant, alert and cooperative, non toxic appearing.  Good historian.  Pt has VSS, RRR, lung clear bilaterally, abdomen soft, non tender.  Neuro intact. No signs of fluid overload - no JVD, lower extremity edema.  ED work up included: CBG, U/A, CBC, BMP, PT/INR, troponin which were all grossly normal.  Lactic acid slightly elevated 2.1. Pt remained asymptomatic throughout ED stay, denied pain.  Initial ddx include vertigo, dehydration, vasovagal response and less likely TIA or cardiac etiology to dizziness.  No further emergent labs or imaging indicated at this time.  Pt discussed with Dr. Markham Jordan who assisted with MDM,  discussion with pt and pt's daughter, and discharge.  Pt stable for discharge at this time with strict ED return precautions.  Pt and pt's daughter agreeable to dispo plan.     Final Clinical Impressions(s) / ED Diagnoses   Final diagnoses:  Dizziness    New Prescriptions Discharge Medication List as of 12/03/2016 12:49 PM       Liberty Handy, PA-C 12/05/16 2300    Mancel Bale, MD 12/06/16 2043

## 2016-12-03 NOTE — ED Notes (Signed)
Pt CBG was 97 notified Jesse SansJanee' Banker(RN)

## 2016-12-03 NOTE — ED Notes (Signed)
EKG given to Dr. Wentz 

## 2016-12-03 NOTE — ED Triage Notes (Signed)
[facility] States she was sitting in her chair and had sudden onset of dizziness. Patient states dizziness has stop/ patient denies any headache, N/V, CP, SOB. Patient alert and oriented x4. Patient moving all extermites Patient CN I-X11 intact. Patient denies any pain.

## 2016-12-03 NOTE — Discharge Instructions (Signed)
Try to drink an extra half quart of water daily, for several days.  Make sure you're eating 3 regular meals each day.  Return here, if needed, for problems.

## 2016-12-03 NOTE — ED Notes (Signed)
Placed pt on bedpan with [facility] from [facility])

## 2016-12-03 NOTE — ED Provider Notes (Signed)
  Face-to-face evaluation   History: She presents for a transient episode of vertigo described as spinning, while eating breakfast this morning, in a seated position. She is unsure of the duration, but feels like it was not longer than 15 minutes. She told a staff member, and because of that was transferred here for evaluation. Her daughter is with her, a physician, whom I know..    Physical exam: Alert, elderly female who is calm and cooperative. No dysarthria, aphasia or nystagmus. Normal strength arms and legs bilaterally.  Medications - No data to display  Patient Vitals for the past 24 hrs:  BP Temp Temp src Pulse Resp SpO2  12/03/16 1245 - - - 72 22 99 %  12/03/16 1230 148/57 - - 72 - 98 %  12/03/16 1215 144/56 - - 63 12 97 %  12/03/16 1200 156/59 - - 71 16 97 %  12/03/16 1145 170/63 - - 76 11 97 %  12/03/16 1130 160/57 - - 73 14 97 %  12/03/16 1115 162/57 - - 80 23 98 %  12/03/16 1100 160/59 - - 76 19 99 %  12/03/16 1039 (!) 167/50 97.5 F (36.4 C) Oral 75 16 98 %    12:49 PM Reevaluation with update and discussion. After initial assessment and treatment, an updated evaluation reveals No change in clinical status. Findings discussed with patient and daughter, all questions answered. Zakhai Meisinger L   Medical decision making- Transient vertigo, with reassuring findings. Differential includes TIA, however, I think that is unlikely. Patient currently anticoagulated. No indication for further evaluation and treatment at this time.   Nursing Notes Reviewed/ Care Coordinated Applicable Imaging Reviewed Interpretation of Laboratory Data incorporated into ED treatment  The patient appears reasonably screened and/or stabilized for discharge and I doubt any other medical condition or other [facility] requiring further screening, evaluation, or treatment in the ED at this time prior to discharge.  Plan: Home Medications- continue; Home Treatments- increase dietary fluid; return here if the  recommended treatment, does not improve the symptoms; Recommended follow up- PCP, when necessary   Medical screening examination/treatment/procedure(s) were conducted as a shared visit with non-physician practitioner(s) and myself.  I personally evaluated the patient during the encounter   Mancel BaleElliott Karema Tocci, MD 12/06/16 2044

## 2016-12-04 LAB — URINE CULTURE

## 2017-01-02 ENCOUNTER — Ambulatory Visit (INDEPENDENT_AMBULATORY_CARE_PROVIDER_SITE_OTHER): Payer: Medicare PPO | Admitting: *Deleted

## 2017-01-02 DIAGNOSIS — I480 Paroxysmal atrial fibrillation: Secondary | ICD-10-CM | POA: Diagnosis not present

## 2017-01-02 DIAGNOSIS — I495 Sick sinus syndrome: Secondary | ICD-10-CM

## 2017-01-02 NOTE — Progress Notes (Signed)
Remote pacemaker transmission.   

## 2017-01-03 ENCOUNTER — Encounter: Payer: Self-pay | Admitting: Cardiology

## 2017-01-18 LAB — CUP PACEART REMOTE DEVICE CHECK
Battery Remaining Percentage: 95.5 %
Battery Voltage: 3.01 V
Brady Statistic RV Percent Paced: 6.2 %
Date Time Interrogation Session: 20180109070015
Implantable Lead Implant Date: 20160623
Implantable Lead Location: 753859
Implantable Lead Model: 1944
Implantable Pulse Generator Implant Date: 20160623
Lead Channel Pacing Threshold Amplitude: 0.5 V
Lead Channel Sensing Intrinsic Amplitude: 0.8 mV
Lead Channel Setting Sensing Sensitivity: 2 mV
MDC IDC LEAD IMPLANT DT: 20160623
MDC IDC LEAD LOCATION: 753860
MDC IDC MSMT BATTERY REMAINING LONGEVITY: 109 mo
MDC IDC MSMT LEADCHNL RA IMPEDANCE VALUE: 590 Ohm
MDC IDC MSMT LEADCHNL RA PACING THRESHOLD PULSEWIDTH: 1 ms
MDC IDC MSMT LEADCHNL RV IMPEDANCE VALUE: 710 Ohm
MDC IDC MSMT LEADCHNL RV PACING THRESHOLD AMPLITUDE: 0.5 V
MDC IDC MSMT LEADCHNL RV PACING THRESHOLD PULSEWIDTH: 0.4 ms
MDC IDC MSMT LEADCHNL RV SENSING INTR AMPL: 11 mV
MDC IDC SET LEADCHNL RA PACING AMPLITUDE: 2.5 V
MDC IDC SET LEADCHNL RV PACING AMPLITUDE: 2.5 V
MDC IDC SET LEADCHNL RV PACING PULSEWIDTH: 0.4 ms
MDC IDC STAT BRADY AP VP PERCENT: 1 %
MDC IDC STAT BRADY AP VS PERCENT: 53 %
MDC IDC STAT BRADY AS VP PERCENT: 5.1 %
MDC IDC STAT BRADY AS VS PERCENT: 41 %
MDC IDC STAT BRADY RA PERCENT PACED: 53 %
Pulse Gen Model: 2240
Pulse Gen Serial Number: 7779640

## 2017-03-24 ENCOUNTER — Emergency Department (HOSPITAL_COMMUNITY): Payer: Medicare PPO

## 2017-03-24 ENCOUNTER — Observation Stay (HOSPITAL_COMMUNITY): Payer: Medicare PPO

## 2017-03-24 ENCOUNTER — Observation Stay (HOSPITAL_COMMUNITY)
Admission: EM | Admit: 2017-03-24 | Discharge: 2017-03-25 | Payer: Medicare PPO | Attending: Family Medicine | Admitting: Family Medicine

## 2017-03-24 ENCOUNTER — Encounter (HOSPITAL_COMMUNITY): Payer: Self-pay | Admitting: Emergency Medicine

## 2017-03-24 DIAGNOSIS — Z95810 Presence of automatic (implantable) cardiac defibrillator: Secondary | ICD-10-CM | POA: Diagnosis not present

## 2017-03-24 DIAGNOSIS — I130 Hypertensive heart and chronic kidney disease with heart failure and stage 1 through stage 4 chronic kidney disease, or unspecified chronic kidney disease: Secondary | ICD-10-CM | POA: Diagnosis not present

## 2017-03-24 DIAGNOSIS — Z885 Allergy status to narcotic agent status: Secondary | ICD-10-CM | POA: Insufficient documentation

## 2017-03-24 DIAGNOSIS — E0590 Thyrotoxicosis, unspecified without thyrotoxic crisis or storm: Secondary | ICD-10-CM | POA: Diagnosis not present

## 2017-03-24 DIAGNOSIS — Z66 Do not resuscitate: Secondary | ICD-10-CM | POA: Insufficient documentation

## 2017-03-24 DIAGNOSIS — I350 Nonrheumatic aortic (valve) stenosis: Secondary | ICD-10-CM | POA: Insufficient documentation

## 2017-03-24 DIAGNOSIS — M4120 Other idiopathic scoliosis, site unspecified: Secondary | ICD-10-CM | POA: Diagnosis not present

## 2017-03-24 DIAGNOSIS — M5136 Other intervertebral disc degeneration, lumbar region: Secondary | ICD-10-CM | POA: Diagnosis not present

## 2017-03-24 DIAGNOSIS — I491 Atrial premature depolarization: Secondary | ICD-10-CM | POA: Insufficient documentation

## 2017-03-24 DIAGNOSIS — I480 Paroxysmal atrial fibrillation: Secondary | ICD-10-CM | POA: Diagnosis not present

## 2017-03-24 DIAGNOSIS — Z881 Allergy status to other antibiotic agents status: Secondary | ICD-10-CM | POA: Insufficient documentation

## 2017-03-24 DIAGNOSIS — K9041 Non-celiac gluten sensitivity: Secondary | ICD-10-CM | POA: Diagnosis not present

## 2017-03-24 DIAGNOSIS — G8929 Other chronic pain: Secondary | ICD-10-CM | POA: Insufficient documentation

## 2017-03-24 DIAGNOSIS — N189 Chronic kidney disease, unspecified: Secondary | ICD-10-CM | POA: Insufficient documentation

## 2017-03-24 DIAGNOSIS — Z9889 Other specified postprocedural states: Secondary | ICD-10-CM | POA: Insufficient documentation

## 2017-03-24 DIAGNOSIS — Z8673 Personal history of transient ischemic attack (TIA), and cerebral infarction without residual deficits: Secondary | ICD-10-CM | POA: Insufficient documentation

## 2017-03-24 DIAGNOSIS — Z9841 Cataract extraction status, right eye: Secondary | ICD-10-CM | POA: Insufficient documentation

## 2017-03-24 DIAGNOSIS — Z884 Allergy status to anesthetic agent status: Secondary | ICD-10-CM | POA: Insufficient documentation

## 2017-03-24 DIAGNOSIS — I110 Hypertensive heart disease with heart failure: Secondary | ICD-10-CM | POA: Diagnosis present

## 2017-03-24 DIAGNOSIS — M549 Dorsalgia, unspecified: Secondary | ICD-10-CM | POA: Insufficient documentation

## 2017-03-24 DIAGNOSIS — Z961 Presence of intraocular lens: Secondary | ICD-10-CM | POA: Insufficient documentation

## 2017-03-24 DIAGNOSIS — Z9842 Cataract extraction status, left eye: Secondary | ICD-10-CM | POA: Insufficient documentation

## 2017-03-24 DIAGNOSIS — I4891 Unspecified atrial fibrillation: Secondary | ICD-10-CM | POA: Diagnosis present

## 2017-03-24 DIAGNOSIS — Z888 Allergy status to other drugs, medicaments and biological substances status: Secondary | ICD-10-CM | POA: Insufficient documentation

## 2017-03-24 DIAGNOSIS — L8990 Pressure ulcer of unspecified site, unspecified stage: Secondary | ICD-10-CM | POA: Diagnosis not present

## 2017-03-24 DIAGNOSIS — G9340 Encephalopathy, unspecified: Principal | ICD-10-CM | POA: Insufficient documentation

## 2017-03-24 DIAGNOSIS — Z7951 Long term (current) use of inhaled steroids: Secondary | ICD-10-CM | POA: Insufficient documentation

## 2017-03-24 DIAGNOSIS — E871 Hypo-osmolality and hyponatremia: Secondary | ICD-10-CM | POA: Insufficient documentation

## 2017-03-24 DIAGNOSIS — Z950 Presence of cardiac pacemaker: Secondary | ICD-10-CM | POA: Diagnosis not present

## 2017-03-24 DIAGNOSIS — G500 Trigeminal neuralgia: Secondary | ICD-10-CM | POA: Diagnosis not present

## 2017-03-24 DIAGNOSIS — I5032 Chronic diastolic (congestive) heart failure: Secondary | ICD-10-CM | POA: Diagnosis not present

## 2017-03-24 DIAGNOSIS — Z7901 Long term (current) use of anticoagulants: Secondary | ICD-10-CM | POA: Insufficient documentation

## 2017-03-24 DIAGNOSIS — E039 Hypothyroidism, unspecified: Secondary | ICD-10-CM | POA: Diagnosis present

## 2017-03-24 DIAGNOSIS — M810 Age-related osteoporosis without current pathological fracture: Secondary | ICD-10-CM | POA: Diagnosis not present

## 2017-03-24 DIAGNOSIS — Z79899 Other long term (current) drug therapy: Secondary | ICD-10-CM | POA: Insufficient documentation

## 2017-03-24 HISTORY — DX: Encephalopathy, unspecified: G93.40

## 2017-03-24 LAB — PHOSPHORUS: Phosphorus: 3.6 mg/dL (ref 2.5–4.6)

## 2017-03-24 LAB — URINALYSIS, ROUTINE W REFLEX MICROSCOPIC
BILIRUBIN URINE: NEGATIVE
Glucose, UA: NEGATIVE mg/dL
Hgb urine dipstick: NEGATIVE
Ketones, ur: NEGATIVE mg/dL
Leukocytes, UA: NEGATIVE
Nitrite: NEGATIVE
PH: 7 (ref 5.0–8.0)
Protein, ur: NEGATIVE mg/dL
SPECIFIC GRAVITY, URINE: 1.005 (ref 1.005–1.030)

## 2017-03-24 LAB — MRSA PCR SCREENING: MRSA BY PCR: NEGATIVE

## 2017-03-24 LAB — COMPREHENSIVE METABOLIC PANEL
ALBUMIN: 3.4 g/dL — AB (ref 3.5–5.0)
ALK PHOS: 64 U/L (ref 38–126)
ALT: 16 U/L (ref 14–54)
ANION GAP: 7 (ref 5–15)
AST: 30 U/L (ref 15–41)
BUN: 13 mg/dL (ref 6–20)
CALCIUM: 8.5 mg/dL — AB (ref 8.9–10.3)
CHLORIDE: 95 mmol/L — AB (ref 101–111)
CO2: 25 mmol/L (ref 22–32)
CREATININE: 0.6 mg/dL (ref 0.44–1.00)
GFR calc Af Amer: >60 mL/min (ref >60)
GFR calc non Af Amer: >60 mL/min (ref >60)
GLUCOSE: 94 mg/dL (ref 65–99)
Potassium: 5.1 mmol/L (ref 3.5–5.1)
SODIUM: 127 mmol/L — AB (ref 135–145)
Total Bilirubin: 0.6 mg/dL (ref 0.3–1.2)
Total Protein: 5.7 g/dL — ABNORMAL LOW (ref 6.5–8.1)

## 2017-03-24 LAB — BASIC METABOLIC PANEL
Anion gap: 9 (ref 5–15)
Anion gap: 4 — ABNORMAL LOW (ref 5–15)
BUN: 10 mg/dL (ref 6–20)
BUN: 9 mg/dL (ref 6–20)
CHLORIDE: 100 mmol/L — AB (ref 101–111)
CO2: 23 mmol/L (ref 22–32)
CO2: 24 mmol/L (ref 22–32)
CREATININE: 0.58 mg/dL (ref 0.44–1.00)
CREATININE: 0.6 mg/dL (ref 0.44–1.00)
Calcium: 8.6 mg/dL — ABNORMAL LOW (ref 8.9–10.3)
Calcium: 8 mg/dL — ABNORMAL LOW (ref 8.9–10.3)
Chloride: 97 mmol/L — ABNORMAL LOW (ref 101–111)
GFR calc non Af Amer: >60 mL/min (ref >60)
GFR calc non Af Amer: >60 mL/min (ref >60)
Glucose, Bld: 104 mg/dL — ABNORMAL HIGH (ref 65–99)
Glucose, Bld: 99 mg/dL (ref 65–99)
POTASSIUM: 4.3 mmol/L (ref 3.5–5.1)
Potassium: 4.3 mmol/L (ref 3.5–5.1)
SODIUM: 129 mmol/L — AB (ref 135–145)
SODIUM: 128 mmol/L — AB (ref 135–145)

## 2017-03-24 LAB — VITAMIN B12: Vitamin B-12: 927 pg/mL — ABNORMAL HIGH (ref 180–914)

## 2017-03-24 LAB — OSMOLALITY: Osmolality: 270 mOsm/kg — ABNORMAL LOW (ref 275–295)

## 2017-03-24 LAB — CBC
HCT: 32.4 % — ABNORMAL LOW (ref 36.0–46.0)
HEMOGLOBIN: 11.2 g/dL — AB (ref 12.0–15.0)
MCH: 31.9 pg (ref 26.0–34.0)
MCHC: 34.6 g/dL (ref 30.0–36.0)
MCV: 92.3 fL (ref 78.0–100.0)
Platelets: 243 10*3/uL (ref 150–400)
RBC: 3.51 MIL/uL — ABNORMAL LOW (ref 3.87–5.11)
RDW: 13.1 % (ref 11.5–15.5)
WBC: 7.5 10*3/uL (ref 4.0–10.5)

## 2017-03-24 LAB — T4, FREE: Free T4: 1.78 ng/dL — ABNORMAL HIGH (ref 0.61–1.12)

## 2017-03-24 LAB — SODIUM, URINE, RANDOM: Sodium, Ur: 30 mmol/L

## 2017-03-24 LAB — MAGNESIUM: Magnesium: 1.9 mg/dL (ref 1.7–2.4)

## 2017-03-24 LAB — OSMOLALITY, URINE: Osmolality, Ur: 239 mOsm/kg — ABNORMAL LOW (ref 300–900)

## 2017-03-24 LAB — TSH: TSH: 0.333 u[IU]/mL — AB (ref 0.350–4.500)

## 2017-03-24 MED ORDER — ACETAMINOPHEN 650 MG RE SUPP
650.0000 mg | Freq: Four times a day (QID) | RECTAL | Status: DC | PRN
Start: 2017-03-24 — End: 2017-03-25

## 2017-03-24 MED ORDER — LISINOPRIL 5 MG PO TABS
5.0000 mg | ORAL_TABLET | Freq: Every day | ORAL | Status: DC
  Administered 2017-03-25: 5 mg via ORAL
  Filled 2017-03-24: qty 1

## 2017-03-24 MED ORDER — ONDANSETRON HCL 4 MG PO TABS: 4.0000 mg | ORAL_TABLET | Freq: Four times a day (QID) | ORAL | Status: DC | PRN

## 2017-03-24 MED ORDER — SODIUM CHLORIDE 0.9 % IV SOLN
INTRAVENOUS | Status: AC
  Administered 2017-03-24: 75 mL/h via INTRAVENOUS

## 2017-03-24 MED ORDER — METOPROLOL TARTRATE 25 MG PO TABS
25.0000 mg | ORAL_TABLET | Freq: Two times a day (BID) | ORAL | Status: DC
  Administered 2017-03-24 – 2017-03-25 (×2): 25 mg via ORAL
  Filled 2017-03-24 (×2): qty 1

## 2017-03-24 MED ORDER — METOPROLOL TARTRATE 25 MG PO TABS: 25.0000 mg | ORAL_TABLET | Freq: Two times a day (BID) | ORAL | Status: DC

## 2017-03-24 MED ORDER — SODIUM CHLORIDE 0.9 % IV SOLN: INTRAVENOUS | Status: DC

## 2017-03-24 MED ORDER — LEVOTHYROXINE SODIUM 88 MCG PO TABS
88.0000 ug | ORAL_TABLET | Freq: Every day | ORAL | Status: DC
  Administered 2017-03-25: 88 ug via ORAL
  Filled 2017-03-24: qty 1

## 2017-03-24 MED ORDER — ONDANSETRON HCL 4 MG/2ML IJ SOLN: 4.0000 mg | Freq: Four times a day (QID) | INTRAMUSCULAR | Status: DC | PRN

## 2017-03-24 MED ORDER — SENNOSIDES-DOCUSATE SODIUM 8.6-50 MG PO TABS: 1.0000 | ORAL_TABLET | Freq: Every evening | ORAL | Status: DC | PRN

## 2017-03-24 MED ORDER — ACETAMINOPHEN 325 MG PO TABS: 650.0000 mg | ORAL_TABLET | Freq: Four times a day (QID) | ORAL | Status: DC | PRN

## 2017-03-24 MED ORDER — SODIUM CHLORIDE 0.9% FLUSH
3.0000 mL | Freq: Two times a day (BID) | INTRAVENOUS | Status: DC
  Administered 2017-03-24: 3 mL via INTRAVENOUS

## 2017-03-24 MED ORDER — APIXABAN 2.5 MG PO TABS
2.5000 mg | ORAL_TABLET | Freq: Two times a day (BID) | ORAL | Status: DC
  Administered 2017-03-24 – 2017-03-25 (×2): 2.5 mg via ORAL
  Filled 2017-03-24 (×2): qty 1

## 2017-03-24 NOTE — ED Notes (Signed)
Report given to 3E 

## 2017-03-24 NOTE — ED Notes (Signed)
Patient in xray, gave family member water and told her her would do a sheet change on patients return.

## 2017-03-24 NOTE — H&P (Signed)
History and Physical    Melanie Obrien UJW:[id number] DOB: 1919/09/11 DOA: 03/24/2017  PCP: Gaye Alken, MD [facility]  Chief Complaint: ams  HPI: Melanie Obrien is a very pleasant 83 y.o. female with medical history significant for A. Fib status post AICD, hypertension, aortic stenosis, chronic diastolic heart failure, chronic kidney disease, hypothyroid, multiple TIAs since emergency Department chief complaint acute encephalopathy. Initial evaluation reveals hyponatremia.  Information is obtained from the patient and the daughter who is at the bedside as information from patient may be unreliable secondary to acute encephalopathy. Daughter reports patient had 2 teeth pulled over 5 weeks ago. Since then she's been on a pured diet. Daughter, who is a physician in the area, feels she's not been taking in enough salt. Additionally she is on torsemide. She was noted to have hyponatremia of 127 3 days ago. At that time torsemide was stopped and some changes in her by mouth intake were made. This morning the daughter spoke to the patient and she was clearly "confused". There is been no recent complaints of cough fever chills nausea vomiting dysuria hematuria frequency or urgency. No complaints of diarrhea constipation melena bright red blood per rectum. Patient denies headache dizziness syncope or near-syncope. She denies chest pain palpitation shortness of breath. Daughter does report that patient reported a fall several days ago where she did not hit her head or lose consciousness. Patient reports she lost her balance using her walker.   ED Course: In the emergency department she is afebrile hemodynamically stable and not hypoxic. She has a sodium 127. She is provided with gentle IV fluids.  Review of Systems: As per HPI otherwise 10 point review of systems negative.   Ambulatory Status: She normally ambulates with a walker  Past Medical History:  Diagnosis  Date  . Acute on chronic diastolic heart failure (HCC)   . AICD (automatic cardioverter/defibrillator) present   . Arthritis    "plenty" (06/17/2015)  . Atrial fibrillation (HCC)   . CHF (congestive heart failure) (HCC)   . Chronic back pain   . Chronic kidney disease    02/17/16 Bun 34, creat 1.05  . Complication of anesthesia    "30 minutes violent shaking after a dental procedure"   . Degenerative disc disease, lumbar    . Diastolic heart failure (HCC)   . Gluten intolerance    . Heart murmur   . Hypertension   . Hypothyroidism    "from the aminodarone"  . Idiopathic scoliosis    . Osteoporosis   . Paroxysmal atrial fibrillation (HCC)   . Pleural effusion 2011   "hugh w/pneumonia"  . Pneumonia 2011; 11/2014  . Sinus bradycardia   . Stroke [facility])    TIA  . Thyroid disease   . TIA (transient ischemic attack) 2000's X 1  . Trigeminal neuralgia of left side of face 12/13/2014  . Unstable gait      Past Surgical History:  Procedure Laterality Date  . BI-VENTRICULAR IMPLANTABLE CARDIOVERTER DEFIBRILLATOR  (CRT-D)  06/17/2015  . CATARACT EXTRACTION W/ INTRAOCULAR LENS  IMPLANT, BILATERAL Bilateral 1991-1992  . EP IMPLANTABLE DEVICE N/A 06/17/2015   Procedure: Pacemaker Implant;  Surgeon: Duke Salvia, MD;  Location: [facility] INVASIVE CV LAB;  Service: Cardiovascular;  Laterality: N/A;  . LOOP RECORDER IMPLANT  08/2013   explanted 06/17/2015  . PACEMAKER INSERTION    . TOENAIL AVULSION Left    2nd digit  . TONSILLECTOMY  1944    Social  History   Social History  . Marital status: Unknown    Spouse name: N/A  . Number of children: N/A  . Years of education: N/A   Occupational History  . Not on file.   Social History Main Topics  . Smoking status: Never Smoker  . Smokeless tobacco: Never Used  . Alcohol use No  . Drug use: No  . Sexual activity: Not on file   Other Topics Concern  . Not on file   Social History Narrative   ** Merged History Encounter **       She  is a resident of [facility]. Allergies  Allergen Reactions  . Codeine Other (See Comments)    unknown  . Codeine Nausea And Vomiting    & headaches  . Levaquin [Levofloxacin In D5w] Itching  . Levaquin [Levofloxacin In D5w] Swelling and Other (See Comments)    & redness  . Loratadine Other (See Comments)    TACHYCARDIA  . Xylocaine [Lidocaine Hcl] Other (See Comments)    Uncontrolled shaking  . Xylocaine [Lidocaine Hcl] Other (See Comments)    Made her very cold & lots of shaking. Pt and daughter stated that she can take it.  . Loratadine Palpitations    Family History  Problem Relation Age of Onset  . Stroke Mother   . Hypertension Father   . Heart attack Neg Hx     Prior to Admission medications   Medication Sig Start Date End Date Taking? Authorizing Provider  apixaban (ELIQUIS) 2.5 MG TABS tablet Take 1 tablet (2.5 mg total) by mouth 2 (two) times daily. 07/13/15  Yes Duke Salvia, MD  fluticasone [facility]) 50 MCG/ACT nasal spray Place 1 spray into both nostrils daily.   Yes Historical Provider, MD  levothyroxine (SYNTHROID, LEVOTHROID) 88 MCG tablet Take 88 mcg by mouth daily before breakfast. Reported on 02/04/2016   Yes Historical Provider, MD  lisinopril (PRINIVIL,ZESTRIL) 5 MG tablet Take 1 tablet (5 mg total) by mouth daily. 12/07/15  Yes Corky Crafts, MD  Magnesium 250 MG TABS Take 250 mg by mouth daily.    Yes Historical Provider, MD  metoprolol tartrate (LOPRESSOR) 25 MG tablet Take 1 tablet (25 mg total) by mouth 2 (two) times daily. 03/21/16  Yes Corky Crafts, MD  Coral Calcium 1000 (390 Ca) MG TABS Take 1,000 mg by mouth every evening.    Historical Provider, MD    Physical Exam: Vitals:   03/24/17 1130 03/24/17 1145 03/24/17 1230 03/24/17 1245  BP: (!) 157/54 (!) 145/50 (!) 137/48 (!) 140/47  Pulse: 61 61 60 (!) 52  Temp:      SpO2: 99% 98% 96% 97%     General:  Appears Thin and frail slightly pale, calm and comfortable Eyes:   PERRL, EOMI, normal lids, iris ENT:  grossly normal hearing, lips & tongue, because membranes of her mouth are pink slightly dry Neck:  no LAD, masses or thyromegaly Cardiovascular:  RRR, no m/r/g. No LE edema. Pedal pulses present and palpable Respiratory:  CTA bilaterally, no w/r/r. Normal respiratory effort. Abdomen:  soft, ntnd, positive bowel sounds throughout no guarding or rebounding Skin:  no rash or induration seen on limited exam Musculoskeletal:  grossly normal tone BUE/BLE, good ROM, no bony abnormality Psychiatric:  grossly normal mood and affect, speech fluent and appropriate, AOx3 Neurologic:  Alert oriented to self and place. [place] clear facial symmetry bilateral grip 4 out of 5 lower sternal be straight bilaterally 4 out of  5.  Labs on Admission: I have personally reviewed following labs and imaging studies  CBC:  Recent Labs Lab 03/24/17 1138  WBC 7.5  HGB 11.2*  HCT 32.4*  MCV 92.3  PLT 243   Basic Metabolic Panel:  Recent Labs Lab 03/24/17 1138  NA 127*  K 5.1  CL 95*  CO2 25  GLUCOSE 94  BUN 13  CREATININE 0.60  CALCIUM 8.5*   GFR: CrCl cannot be calculated (Unknown ideal weight.). Liver Function Tests:  Recent Labs Lab 03/24/17 1138  AST 30  ALT 16  ALKPHOS 64  BILITOT 0.6  PROT 5.7*  ALBUMIN 3.4*   No results for input(s): LIPASE, AMYLASE in the last 168 hours. No results for input(s): AMMONIA in the last 168 hours. Coagulation Profile: No results for input(s): INR, PROTIME in the last 168 hours. Cardiac Enzymes: No results for input(s): CKTOTAL, CKMB, CKMBINDEX, TROPONINI in the last 168 hours. BNP (last 3 results) No results for input(s): PROBNP in the last 8760 hours. HbA1C: No results for input(s): HGBA1C in the last 72 hours. CBG: No results for input(s): GLUCAP in the last 168 hours. Lipid Profile: No results for input(s): CHOL, HDL, LDLCALC, TRIG, CHOLHDL, LDLDIRECT in the last 72 hours. Thyroid Function Tests: No  results for input(s): TSH, T4TOTAL, FREET4, T3FREE, THYROIDAB in the last 72 hours. Anemia Panel: No results for input(s): VITAMINB12, FOLATE, FERRITIN, TIBC, IRON, RETICCTPCT in the last 72 hours. Urine analysis:    Component Value Date/Time   COLORURINE STRAW (A) 03/24/2017 1218   APPEARANCEUR CLEAR 03/24/2017 1218   LABSPEC 1.005 03/24/2017 1218   PHURINE 7.0 03/24/2017 1218   GLUCOSEU NEGATIVE 03/24/2017 1218   HGBUR NEGATIVE 03/24/2017 1218   BILIRUBINUR NEGATIVE 03/24/2017 1218   KETONESUR NEGATIVE 03/24/2017 1218   PROTEINUR NEGATIVE 03/24/2017 1218   UROBILINOGEN 0.2 01/09/2016 1545   NITRITE NEGATIVE 03/24/2017 1218   LEUKOCYTESUR NEGATIVE 03/24/2017 1218    Creatinine Clearance: CrCl cannot be calculated (Unknown ideal weight.).  Sepsis Labs: (procalcitonin:4,lacticidven:4) )No results found for this or any previous visit (from the past 240 hour(s)).   Radiological Exams on Admission: Dg Chest 2 View  Result Date: 03/24/2017 CLINICAL DATA:  Pt states she has known fluid in lungs, and is denying any chest pain. Xray obtained for chest pain, but pt is confused. EXAM: CHEST  2 VIEW COMPARISON:  11/24/2015 FINDINGS: The cardiac silhouette is mildly enlarged. No mediastinal or hilar masses or convincing adenopathy. Left anterior chest wall sequential pacemaker is stable and well positioned. Clear lungs.  No pleural effusion.  No pneumothorax. Skeletal structures are demineralized. There is an old healed proximal left humerus fracture. IMPRESSION: No acute cardiopulmonary disease. Electronically Signed   By: Amie Portland M.D.   On: 03/24/2017 13:15    EKG: Independently reviewed. Sinus rhythm Atrial premature complex Prolonged PR interval Probable left atrial enlargement LAD, consider left anterior fascicular block Abnormal R-wave progression, late  Assessment/Plan Principal Problem:   Encephalopathy Active Problems:   Atrial fibrillation (HCC)   Chronic  diastolic congestive heart failure (HCC)   HTN (hypertension)   Hypothyroidism   Chronic kidney disease   Aortic stenosis   Hyponatremia   Acute encephalopathy   1. Acute encephalopathy. Likely related to hyponatremia secondary to decreased oral intake in the setting of diuretics. Sodium level CXXVII on admission. Chart review indicates sodium level 127 3 days ago as well. Neuro exam without focal deficits. No indications of infectious process. Chest x-ray without cardiopulmonary disease. Urinalysis negative -  Admit to telemetry -Gentle IV fluids -Urine sodium, urine osmolality -Serum osmolality -Magnesium and phosphorus -will obtain CT brain for completeness -B12 and folate  #2. Hyponatremia. Related to decreased oral intake secondary to dental work in the setting of diuretics. Should be able to advance diet as  Teeth extraction 5 weeks ago. -Urine sodium and urine osmolality -Serum osmolality -Gentle IV fluids -Dietary consult -Monitor closely  #3. Chronic kidney disease. Appears stable at baseline -Monitor urine output  #4. Chronic diastolic heart failure.appears somewhat dry. Home meds include metoprolol and lisinopril. Torsemide recently discontinued. Chest straight without cardiopulmonary disease. -IV fluids as noted above -Daily weights -Monitor intake and output -Resume BB  5. A. fib.s/p AICD. Home meds include eliquis. [place] score 5. EKG as noted above -obtainTSH -continue home meds  6. Hypothyroidism. -TSH -continue home meds  #7. HTN. Home meds include BB and lisinopril. BP stable on admission at the high end of normal -resume BB at 10pm -resume lisinopril tomorrow   DVT prophylaxis: eliquis  Code Status: dnr  Family Communication: daughter at bedside  [facility]  Consults called: none  Admission status: obs    Gwenyth Bender MD [facility]  If 7PM-7AM, please contact night-coverage [email or web address] Password  [email or web address]  03/24/2017, 2:07 PM

## 2017-03-24 NOTE — ED Triage Notes (Addendum)
Pt to ED via private vehicle with daughter -- who is a family practice MD in [place]-- daughter states pt had 2 teeth pulled 5 weeks ago, went on a pureed diet for 3 weeks, became intermittently confused,  2 weeks ago had labs and urine culture-- all was neg, still had some confusion  Tuesday -- fell off of walker, did not hit head.  Thursday-- had labs drawn-- Na= 127,   Daughter -- [name]-- [phone]  Health Care POA

## 2017-03-24 NOTE — ED Provider Notes (Signed)
[facility]-EMERGENCY DEPT Provider Note   CSN: [id number] Arrival date & time: 03/24/17  1041     History   Chief Complaint Chief Complaint  Patient presents with  . Altered Mental Status    HPI NALAYA WOJDYLA is a 83 y.o. female.  HPI   Patient is a 13 her old female presenting with altered mental status. According to patient's daughter, who is a family physician in the area, patient has been altered for the last 3-5 weeks. 5 weeks ago she had teeth pulled and has been on a pure diet. Patient not have enough salt intake. She was noted to have hyponatremia to 127 3 days ago. The stopped her turosemide and try to increase dietary intake with only change in her Sodium up to 128. Patient still remains confused. She is starting to gain weight from her dry weight 103 from 100 pounds  Past Medical History:  Diagnosis Date  . Acute on chronic diastolic heart failure (HCC)   . AICD (automatic cardioverter/defibrillator) present   . Arthritis    "plenty" (06/17/2015)  . Atrial fibrillation (HCC)   . CHF (congestive heart failure) (HCC)   . Chronic back pain   . Chronic kidney disease    02/17/16 Bun 34, creat 1.05  . Complication of anesthesia    "30 minutes violent shaking after a dental procedure"   . Degenerative disc disease, lumbar    . Diastolic heart failure (HCC)   . Gluten intolerance    . Heart murmur   . Hypertension   . Hypothyroidism    "from the aminodarone"  . Idiopathic scoliosis    . Osteoporosis   . Paroxysmal atrial fibrillation (HCC)   . Pleural effusion 2011   "hugh w/pneumonia"  . Pneumonia 2011; 11/2014  . Sinus bradycardia   . Stroke [facility])    TIA  . Thyroid disease   . TIA (transient ischemic attack) 2000's X 1  . Trigeminal neuralgia of left side of face 12/13/2014  . Unstable gait      Patient Active Problem List   Diagnosis Date Noted  . Aortic stenosis 10/17/2016  . Chronic kidney disease   . Paranoia (psychosis) (HCC) 02/15/2016  . Acute  bronchitis 02/04/2016  . Fatigue 06/17/2015  . Hypothyroidism 05/18/2015  . Unstable gait 04/24/2015  . Degenerative disc disease, lumbar 04/24/2015  . Idiopathic scoliosis 04/24/2015  . Weakness 04/24/2015  . Dyspnea 04/24/2015  . Gluten intolerance 04/24/2015  . HTN (hypertension) 04/20/2015  . Fracture of humeral head, left, closed 04/17/2015  . Fall   . Other seasonal allergic rhinitis   . Malnutrition of moderate degree (HCC) 01/21/2015  . Hepatopathy - congestive 01/20/2015  . Chronic diastolic congestive heart failure (HCC) 01/18/2015  . Lower extremity edema   . Paroxysmal atrial fibrillation (HCC)   . Elevated LFTs 01/16/2015  . Syncope 01/16/2015  . SOB (shortness of breath)   . Atrial fibrillation (HCC) 12/13/2014  . TIA (transient ischemic attack) 12/13/2014  . Trigeminal neuralgia of left side of face 12/13/2014  . Hot flashes 12/13/2014    Past Surgical History:  Procedure Laterality Date  . BI-VENTRICULAR IMPLANTABLE CARDIOVERTER DEFIBRILLATOR  (CRT-D)  06/17/2015  . CATARACT EXTRACTION W/ INTRAOCULAR LENS  IMPLANT, BILATERAL Bilateral 1991-1992  . EP IMPLANTABLE DEVICE N/A 06/17/2015   Procedure: Pacemaker Implant;  Surgeon: Duke Salvia, MD;  Location: [facility] INVASIVE CV LAB;  Service: Cardiovascular;  Laterality: N/A;  . LOOP RECORDER IMPLANT  08/2013   explanted 06/17/2015  .  PACEMAKER INSERTION    . TOENAIL AVULSION Left    2nd digit  . TONSILLECTOMY  1944    OB History    Gravida Para Term Preterm AB Living   0 0 0 0 0     SAB TAB Ectopic Multiple Live Births   0 0 0           Home Medications    Prior to Admission medications   Medication Sig Start Date End Date Taking? Authorizing Provider  apixaban (ELIQUIS) 2.5 MG TABS tablet Take 1 tablet (2.5 mg total) by mouth 2 (two) times daily. 07/13/15  Yes Duke Salvia, MD  fluticasone [facility]) 50 MCG/ACT nasal spray Place 1 spray into both nostrils daily.   Yes Historical Provider, MD    levothyroxine (SYNTHROID, LEVOTHROID) 88 MCG tablet Take 88 mcg by mouth daily before breakfast. Reported on 02/04/2016   Yes Historical Provider, MD  lisinopril (PRINIVIL,ZESTRIL) 5 MG tablet Take 1 tablet (5 mg total) by mouth daily. 12/07/15  Yes Corky Crafts, MD  Magnesium 250 MG TABS Take 250 mg by mouth daily.    Yes Historical Provider, MD  metoprolol tartrate (LOPRESSOR) 25 MG tablet Take 1 tablet (25 mg total) by mouth 2 (two) times daily. 03/21/16  Yes Corky Crafts, MD  Coral Calcium 1000 (390 Ca) MG TABS Take 1,000 mg by mouth every evening.    Historical Provider, MD  fexofenadine (ALLEGRA) 180 MG tablet Take 1 tablet (180 mg total) by mouth daily. Patient not taking: Reported on 03/24/2017 05/17/15   Kimber Relic, MD  torsemide (DEMADEX) 20 MG tablet Take 20 mg BID for daily weight greater than 102 lbs. Take 20 mg once daily for daily weight of 102 or less. Patient not taking: Reported on 03/24/2017 10/17/16   Corky Crafts, MD    Family History Family History  Problem Relation Age of Onset  . Stroke Mother   . Hypertension Father   . Heart attack Neg Hx     Social History Social History  Substance Use Topics  . Smoking status: Never Smoker  . Smokeless tobacco: Never Used  . Alcohol use No     Allergies   Codeine; Codeine; Levaquin [levofloxacin in d5w]; Levaquin [levofloxacin in d5w]; Loratadine; Xylocaine [lidocaine hcl]; Xylocaine [lidocaine hcl]; and Loratadine   Review of Systems Review of Systems  Constitutional: Positive for activity change.  Respiratory: Negative for shortness of breath.   Cardiovascular: Negative for chest pain.  Gastrointestinal: Negative for abdominal pain.  Psychiatric/Behavioral: Positive for confusion.  All other systems reviewed and are negative.    Physical Exam Updated Vital Signs There were no vitals taken for this visit.  Physical Exam  Constitutional: She is oriented to person, place, and time. She  appears well-developed and well-nourished.  HENT:  Head: Normocephalic and atraumatic.  Eyes: Pupils are equal, round, and reactive to light. Right eye exhibits no discharge. Left eye exhibits no discharge.  Mild icterus  Cardiovascular: Normal rate and regular rhythm.   Murmur heard. Pulmonary/Chest: Effort normal and breath sounds normal.  Abdominal: Soft. There is no tenderness.  Musculoskeletal:  + 1 edema on L  LE.  Neurological: She is oriented to person, place, and time.  Skin: Skin is warm and dry. She is not diaphoretic.  Psychiatric: She has a normal mood and affect.  Nursing note and vitals reviewed.    ED Treatments / Results  Labs (all labs ordered are listed, but only abnormal results are  displayed) Labs Reviewed  COMPREHENSIVE METABOLIC PANEL - Abnormal; Notable for the following:       Result Value   Sodium 127 (*)    Chloride 95 (*)    Calcium 8.5 (*)    Total Protein 5.7 (*)    Albumin 3.4 (*)    All other components within normal limits  CBC - Abnormal; Notable for the following:    RBC 3.51 (*)    Hemoglobin 11.2 (*)    HCT 32.4 (*)    All other components within normal limits  URINALYSIS, ROUTINE W REFLEX MICROSCOPIC - Abnormal; Notable for the following:    Color, Urine STRAW (*)    All other components within normal limits  CBG MONITORING, ED    EKG  EKG Interpretation None       Radiology Dg Chest 2 View  Result Date: 03/24/2017 CLINICAL DATA:  Pt states she has known fluid in lungs, and is denying any chest pain. Xray obtained for chest pain, but pt is confused. EXAM: CHEST  2 VIEW COMPARISON:  11/24/2015 FINDINGS: The cardiac silhouette is mildly enlarged. No mediastinal or hilar masses or convincing adenopathy. Left anterior chest wall sequential pacemaker is stable and well positioned. Clear lungs.  No pleural effusion.  No pneumothorax. Skeletal structures are demineralized. There is an old healed proximal left humerus fracture.  IMPRESSION: No acute cardiopulmonary disease. Electronically Signed   By: Amie Portland M.D.   On: 03/24/2017 13:15    Procedures Procedures (including critical care time)  Medications Ordered in ED Medications - No data to display   Initial Impression / Assessment and Plan / ED Course  I have reviewed the triage vital signs and the nursing notes.  Pertinent labs & imaging results that were available during my care of the patient were reviewed by me and considered in my medical decision making (see chart for details).     Patient is a pleasant 83 year old female presenting with altered mental status. Patient's been having increasing confusion for the last 3 weeks. Patient's not been able to take adequate amounts of dietary sodium in. In addition patient's been on furosemide. Patient noted to be hyponatremic as an outpatient and had several alterations to her diet and medications. However she's remained hyponatremic. We'll repeat labs today. Patient does have confusion on exam.   1:31 PM Labs continued to show hyponatremia. Patient is already attempted correction of this as an outpatient therefore may require inpatient for continued monitoring,, fluids, change of CHF medications.  Final Clinical Impressions(s) / ED Diagnoses   Final diagnoses:  None    New Prescriptions New Prescriptions   No medications on file     Aftyn Nott Randall An, MD 03/24/17 1331

## 2017-03-24 NOTE — ED Notes (Signed)
Patient transported to CT via stretcher.

## 2017-03-25 DIAGNOSIS — G9340 Encephalopathy, unspecified: Secondary | ICD-10-CM | POA: Diagnosis not present

## 2017-03-25 LAB — CBC
HEMATOCRIT: 30.4 % — AB (ref 36.0–46.0)
HEMOGLOBIN: 10.5 g/dL — AB (ref 12.0–15.0)
MCH: 31.9 pg (ref 26.0–34.0)
MCHC: 34.5 g/dL (ref 30.0–36.0)
MCV: 92.4 fL (ref 78.0–100.0)
PLATELETS: 219 10*3/uL (ref 150–400)
RBC: 3.29 MIL/uL — ABNORMAL LOW (ref 3.87–5.11)
RDW: 13.4 % (ref 11.5–15.5)
WBC: 7.2 10*3/uL (ref 4.0–10.5)

## 2017-03-25 LAB — BASIC METABOLIC PANEL
Anion gap: 6 (ref 5–15)
BUN: 7 mg/dL (ref 6–20)
CALCIUM: 8.3 mg/dL — AB (ref 8.9–10.3)
CO2: 25 mmol/L (ref 22–32)
Chloride: 100 mmol/L — ABNORMAL LOW (ref 101–111)
Creatinine, Ser: 0.59 mg/dL (ref 0.44–1.00)
GFR calc Af Amer: >60 mL/min (ref >60)
Glucose, Bld: 88 mg/dL (ref 65–99)
POTASSIUM: 4.3 mmol/L (ref 3.5–5.1)
SODIUM: 131 mmol/L — AB (ref 135–145)

## 2017-03-25 LAB — T3, FREE: T3 FREE: 2.7 pg/mL (ref 2.0–4.4)

## 2017-03-25 LAB — RPR: RPR: NONREACTIVE

## 2017-03-25 MED ORDER — LEVOTHYROXINE SODIUM 75 MCG PO TABS: 75.0000 ug | ORAL_TABLET | Freq: Every day | ORAL | 0 refills | Status: AC

## 2017-03-25 NOTE — Discharge Summary (Signed)
Physician Discharge Summary  Melanie Obrien ZOX:[id number] DOB: November 30, 1919 DOA: 03/24/2017  PCP: Gaye Alken, MD  Admit date: 03/24/2017 Discharge date: 03/25/2017  Time spent: > 35 minutes  Recommendations for Outpatient Follow-up:  1. Monitor TSH in 6 weeks, Synthroid decreased to 75 g daily given hyperthyroidism pattern when reviewing labs. 2. Monitor sodium levels. Increase oral solute intake.   Discharge Diagnoses:  Principal Problem:   Encephalopathy Active Problems:   Atrial fibrillation (HCC)   Chronic diastolic congestive heart failure (HCC)   HTN (hypertension)   Hypothyroidism   Chronic kidney disease   Aortic stenosis   Hyponatremia   Acute encephalopathy   Pressure injury of skin   Discharge Condition: stable  Diet recommendation: dysphagia 1 diet  Filed Weights   03/24/17 1547 03/25/17 0547  Weight: 46.3 kg (102 lb 1.6 oz) 45.9 kg (101 lb 3.2 oz)    History of present illness:   83 y.o. female with a Past Medical History significant for atrial fibrillation, hypertension, aortic stenosis, heart failure, chronic kidney disease and transient ischemic attacks who presents with acute encephalopathy.  Daughter at bedside reports that patient has had signs of memory impairment over the last several months.  Hospital Course:  Hyponatremia - I suspect secondary to poor oral solute intake. Recommended increasing oral solute intake  AMS - currently patient alert and oriented x 3 - could have been secondary to hyponatremia.  - CT of head reported no intracranial abnormalities  Hyperthyroidism - most likely secondary to too much synthroid. Decrease dose to daily  For other known conditions listed above will continue home medication regimen listed below.  Procedures:  None  Consultations:  None  Discharge Exam: Vitals:   03/25/17 0030 03/25/17 0547  BP: (!) 150/56 (!) 141/58  Pulse: 68 71  Resp: 18 18  Temp: 98.4 F (36.9 C) 97.9  F (36.6 C)    General: Patient in no acute distress, alert and awake Cardiovascular: Regular rate and rhythm, no rubs Respiratory: No increased work of breathing, no wheezes  Discharge Instructions   Discharge Instructions    Call MD for:  extreme fatigue    Complete by:  As directed    Call MD for:  temperature >100.4    Complete by:  As directed    Diet - low sodium heart healthy    Complete by:  As directed    Increase activity slowly    Complete by:  As directed      Current Discharge Medication List    CONTINUE these medications which have CHANGED   Details  levothyroxine (SYNTHROID, LEVOTHROID) 75 MCG tablet Take 1 tablet (75 mcg total) by mouth daily before breakfast. Reported on 02/04/2016 Qty: 30 tablet, Refills: 0      CONTINUE these medications which have NOT CHANGED   Details  apixaban (ELIQUIS) 2.5 MG TABS tablet Take 1 tablet (2.5 mg total) by mouth 2 (two) times daily. Qty: 60 tablet, Refills: 5    fluticasone (FLONASE) 50 MCG/ACT nasal spray Place 1 spray into both nostrils daily.    lisinopril (PRINIVIL,ZESTRIL) 5 MG tablet Take 1 tablet (5 mg total) by mouth daily. Qty: 30 tablet, Refills: 11    Magnesium 250 MG TABS Take 250 mg by mouth daily.     metoprolol tartrate (LOPRESSOR) 25 MG tablet Take 1 tablet (25 mg total) by mouth 2 (two) times daily. Qty: 60 tablet, Refills: 12       Allergies  Allergen Reactions  . Codeine Other (  See Comments)    unknown  . Codeine Nausea And Vomiting    & headaches  . Levaquin [Levofloxacin In D5w] Itching  . Levaquin [Levofloxacin In D5w] Swelling and Other (See Comments)    & redness  . Loratadine Other (See Comments)    TACHYCARDIA  . Xylocaine [Lidocaine Hcl] Other (See Comments)    Uncontrolled shaking  . Xylocaine [Lidocaine Hcl] Other (See Comments)    Made her very cold & lots of shaking. Pt and daughter stated that she can take it.  . Loratadine Palpitations      The results of significant  diagnostics from this hospitalization (including imaging, microbiology, ancillary and laboratory) are listed below for reference.    Significant Diagnostic Studies: Dg Chest 2 View  Result Date: 03/24/2017 CLINICAL DATA:  Pt states she has known fluid in lungs, and is denying any chest pain. Xray obtained for chest pain, but pt is confused. EXAM: CHEST  2 VIEW COMPARISON:  11/24/2015 FINDINGS: The cardiac silhouette is mildly enlarged. No mediastinal or hilar masses or convincing adenopathy. Left anterior chest wall sequential pacemaker is stable and well positioned. Clear lungs.  No pleural effusion.  No pneumothorax. Skeletal structures are demineralized. There is an old healed proximal left humerus fracture. IMPRESSION: No acute cardiopulmonary disease. Electronically Signed   By: Amie Portland M.D.   On: 03/24/2017 13:15   Ct Head Wo Contrast  Result Date: 03/24/2017 CLINICAL DATA:  Larey Seat several weeks ago, confusion, evaluate encephalopathy, history paroxysmal atrial fibrillation, stroke, CHF, hypertension, diastolic heart failure EXAM: CT HEAD WITHOUT CONTRAST TECHNIQUE: Contiguous axial images were obtained from the base of the skull through the vertex without intravenous contrast. Sagittal and coronal MPR images reconstructed from axial data set. COMPARISON:  04/17/2015 FINDINGS: Brain: Generalized atrophy. Normal ventricular morphology. No midline shift or mass effect. Small vessel chronic ischemic changes of deep cerebral white matter. No intracranial hemorrhage, mass lesion, evidence of acute infarction, or extra-axial fluid collection. Vascular: Atherosclerotic calcifications at the carotid siphons Skull: Demineralized but intact Sinuses/Orbits: Increased opacification of LEFT mastoid air cells and LEFT middle ear cavity Other: N/A IMPRESSION: Atrophy with small vessel chronic ischemic changes of deep cerebral white matter. No acute intracranial abnormalities. Increased LEFT mastoid effusion and  LEFT middle ear opacification. Electronically Signed   By: Ulyses Southward M.D.   On: 03/24/2017 15:29    Microbiology: Recent Results (from the past 240 hour(s))  MRSA PCR Screening     Status: None   Collection Time: 03/24/17  4:53 PM  Result Value Ref Range Status   MRSA by PCR NEGATIVE NEGATIVE Final    Comment:        The GeneXpert MRSA Assay (FDA approved for NASAL specimens only), is one component of a comprehensive MRSA colonization surveillance program. It is not intended to diagnose MRSA infection nor to guide or monitor treatment for MRSA infections.      Labs: Basic Metabolic Panel:  Recent Labs Lab 03/24/17 1138 03/24/17 1429 03/24/17 1844 03/24/17 2049 03/25/17 0202  NA 127*  --  129* 128* 131*  K 5.1  --  4.3 4.3 4.3  CL 95*  --  97* 100* 100*  CO2 25  --  GLUCOSE 94  --  104* 99 88  BUN 13  --  CREATININE 0.60  --  0.58 0.60 0.59  CALCIUM 8.5*  --  8.6* 8.0* 8.3*  MG  --  1.9  --   --   --  PHOS  --  3.6  --   --   --    Liver Function Tests:  Recent Labs Lab 03/24/17 1138  AST 30  ALT 16  ALKPHOS 64  BILITOT 0.6  PROT 5.7*  ALBUMIN 3.4*   No results for input(s): LIPASE, AMYLASE in the last 168 hours. No results for input(s): AMMONIA in the last 168 hours. CBC:  Recent Labs Lab 03/24/17 1138 03/25/17 0202  WBC 7.5 7.2  HGB 11.2* 10.5*  HCT 32.4* 30.4*  MCV 92.3 92.4  PLT 243 219   Cardiac Enzymes: No results for input(s): CKTOTAL, CKMB, CKMBINDEX, TROPONINI in the last 168 hours. BNP: BNP (last 3 results) No results for input(s): BNP in the last 8760 hours.  ProBNP (last 3 results) No results for input(s): PROBNP in the last 8760 hours.  CBG: No results for input(s): GLUCAP in the last 168 hours.   Signed:  Penny Pia MD.  [facility] 03/25/2017, 11:45 AM

## 2017-03-25 NOTE — Clinical Social Work Note (Signed)
CSW notified pt ready for DC. CSW notified family; Dr. Uvaldo Rising that pt was ready for DC. Family will transport pt back to [facility] around [place] today.  Family also expressed concerns that pt has a [employer]quetta Racey credit card wallet missing. The family has already checked with security, no luck. CSW notified nurse who will FU with family when they arrive.   Pt has no other needs at this time. CSW is signing off.  Stephany Poorman B. Gean Quint Clinical Social Work Dept Weekend Social Worker [phone] 1:03 PM  '

## 2017-03-25 NOTE — Evaluation (Signed)
Physical Therapy Evaluation Patient Details Name: Melanie Obrien MRN: [id number] DOB: 01-08-1919 Today's Date: 03/25/2017   History of Present Illness  Melanie Obrien is a very pleasant 83 y.o. female with medical history significant for A. Fib status post AICD, hypertension, aortic stenosis, chronic diastolic heart failure, chronic kidney disease, hypothyroid, multiple TIAs since emergency Department chief complaint acute encephalopathy. Initial evaluation reveals hyponatremia.  Clinical Impression   Patient evaluated by Physical Therapy with no further acute PT needs identified, as she is to dc back to ALF today. All education has been completed and the patient has no further questions.  See below for any follow-up Physical Therapy or equipment needs. PT is signing off. Thank you for this referral.     Follow Up Recommendations Home health PT (at ALF)    Equipment Recommendations  Other (comment) (youth-sized RW)    Recommendations for Other Services       Precautions / Restrictions Precautions Precautions: Fall      Mobility  Bed Mobility                  Transfers Overall transfer level: Needs assistance Equipment used: 1 person hand held assist Transfers: Sit to/from Stand Sit to Stand: Min guard         General transfer comment: minguard for safety  Ambulation/Gait Ambulation/Gait assistance: Min assist Ambulation Distance (Feet): 80 Feet Assistive device: 1 person hand held assist (and use of hallway rail) Gait Pattern/deviations: Step-through pattern;Trunk flexed     General Gait Details: Cues to self-monitor for activity tolerance; the RW in her room was too tall, so we opted for handheld assist and use of hallway rail; Definitely benefits from bilateral UE support  Stairs            Wheelchair Mobility    Modified Rankin (Stroke Patients Only)       Balance                                             Pertinent  Vitals/Pain Pain Assessment: Faces Faces Pain Scale: Hurts a little bit Pain Location: neck pain without neck support in recliner Pain Descriptors / Indicators: Aching Pain Intervention(s): Monitored during session (took extra time on positioning and support in recliner)    Home Living Family/patient expects to be discharged to:: Assisted living               Home Equipment: Gilford Rile - 4 wheels Additional Comments: [facility]    Prior Function Level of Independence: Independent with assistive device(s)         Comments: RW     Hand Dominance        Extremity/Trunk Assessment   Upper Extremity Assessment Upper Extremity Assessment: Generalized weakness (R shoulder weakness, previous humerus fx)    Lower Extremity Assessment Lower Extremity Assessment: Generalized weakness    Cervical / Trunk Assessment Cervical / Trunk Assessment: Kyphotic  Communication   Communication: No difficulties  Cognition Arousal/Alertness: Awake/alert Behavior During Therapy: WFL for tasks assessed/performed Overall Cognitive Status: Within Functional Limits for tasks assessed                                        General Comments General comments (skin integrity, edema, etc.): Spent extra time positioning  pt in chair for neck and UE support    Exercises     Assessment/Plan    PT Assessment All further PT needs can be met in the next venue of care  PT Problem List Decreased strength;Decreased range of motion;Decreased activity tolerance;Decreased balance;Decreased mobility;Pain       PT Treatment Interventions      PT Goals (Current goals can be found in the Care Plan section)  Acute Rehab PT Goals Patient Stated Goal: home soon PT Goal Formulation: All assessment and education complete, DC therapy    Frequency     Barriers to discharge        Co-evaluation               End of Session Equipment Utilized During Treatment: Gait  belt Activity Tolerance: Patient tolerated treatment well Patient left: in chair;with call bell/phone within reach;with chair alarm set Nurse Communication: Mobility status PT Visit Diagnosis: Unsteadiness on feet (R26.81);Muscle weakness (generalized) (M62.81)    Time: 5625-6389 PT Time Calculation (min) (ACUTE ONLY): 39 min   Charges:   PT Evaluation $PT Eval Moderate Complexity: 1 Procedure PT Treatments $Gait Training: 8-22 mins $Therapeutic Activity: 8-22 mins   PT G Codes:   PT G-Codes **NOT FOR INPATIENT CLASS** Functional Assessment Tool Used: Clinical judgement Functional Limitation: Mobility: Walking and moving around Mobility: Walking and Moving Around Current Status (H7342): At least 1 percent but less than 20 percent impaired, limited or restricted Mobility: Walking and Moving Around Goal Status [phone]): 0 percent impaired, limited or restricted    Roney Marion, PT  [place] Pager [phone] Office [place] 03/25/2017, 3:55 PM

## 2017-03-25 NOTE — Progress Notes (Signed)
Pt has order to discharge to ALF. Daughter arrived to take patient to [facility]. Discharge instructions reviewed with daughter. Daughter found patient's wallet in her bag. Patient escorted off of the floor via wheelchair. Report given to [facility] RN.

## 2017-03-26 LAB — FOLATE RBC
Folate, Hemolysate: 562 ng/mL
Folate, RBC: 1729 ng/mL (ref >498)
Hematocrit: 32.5 % — ABNORMAL LOW (ref 34.0–46.6)

## 2017-04-02 ENCOUNTER — Emergency Department (HOSPITAL_COMMUNITY): Payer: Medicare PPO

## 2017-04-02 ENCOUNTER — Inpatient Hospital Stay (HOSPITAL_COMMUNITY)
Admission: EM | Admit: 2017-04-02 | Discharge: 2017-04-05 | DRG: 152 | Disposition: A | Discharge status: Skilled Nursing Facility | Payer: Medicare PPO | Attending: Internal Medicine | Admitting: Internal Medicine

## 2017-04-02 ENCOUNTER — Encounter (HOSPITAL_COMMUNITY): Payer: Self-pay | Admitting: Emergency Medicine

## 2017-04-02 DIAGNOSIS — M4120 Other idiopathic scoliosis, site unspecified: Secondary | ICD-10-CM | POA: Diagnosis present

## 2017-04-02 DIAGNOSIS — I444 Left anterior fascicular block: Secondary | ICD-10-CM | POA: Diagnosis present

## 2017-04-02 DIAGNOSIS — Z8673 Personal history of transient ischemic attack (TIA), and cerebral infarction without residual deficits: Secondary | ICD-10-CM

## 2017-04-02 DIAGNOSIS — Z79899 Other long term (current) drug therapy: Secondary | ICD-10-CM

## 2017-04-02 DIAGNOSIS — G9340 Encephalopathy, unspecified: Secondary | ICD-10-CM | POA: Diagnosis present

## 2017-04-02 DIAGNOSIS — H66002 Acute suppurative otitis media without spontaneous rupture of ear drum, left ear: Secondary | ICD-10-CM | POA: Diagnosis not present

## 2017-04-02 DIAGNOSIS — E039 Hypothyroidism, unspecified: Secondary | ICD-10-CM | POA: Diagnosis present

## 2017-04-02 DIAGNOSIS — Z881 Allergy status to other antibiotic agents status: Secondary | ICD-10-CM

## 2017-04-02 DIAGNOSIS — H6690 Otitis media, unspecified, unspecified ear: Secondary | ICD-10-CM | POA: Diagnosis present

## 2017-04-02 DIAGNOSIS — I130 Hypertensive heart and chronic kidney disease with heart failure and stage 1 through stage 4 chronic kidney disease, or unspecified chronic kidney disease: Secondary | ICD-10-CM | POA: Diagnosis present

## 2017-04-02 DIAGNOSIS — Z9841 Cataract extraction status, right eye: Secondary | ICD-10-CM

## 2017-04-02 DIAGNOSIS — R410 Disorientation, unspecified: Secondary | ICD-10-CM

## 2017-04-02 DIAGNOSIS — I5032 Chronic diastolic (congestive) heart failure: Secondary | ICD-10-CM | POA: Diagnosis present

## 2017-04-02 DIAGNOSIS — M549 Dorsalgia, unspecified: Secondary | ICD-10-CM | POA: Diagnosis present

## 2017-04-02 DIAGNOSIS — I110 Hypertensive heart disease with heart failure: Secondary | ICD-10-CM | POA: Diagnosis present

## 2017-04-02 DIAGNOSIS — I480 Paroxysmal atrial fibrillation: Secondary | ICD-10-CM | POA: Diagnosis present

## 2017-04-02 DIAGNOSIS — N189 Chronic kidney disease, unspecified: Secondary | ICD-10-CM | POA: Diagnosis present

## 2017-04-02 DIAGNOSIS — G8929 Other chronic pain: Secondary | ICD-10-CM | POA: Diagnosis present

## 2017-04-02 DIAGNOSIS — M810 Age-related osteoporosis without current pathological fracture: Secondary | ICD-10-CM | POA: Diagnosis present

## 2017-04-02 DIAGNOSIS — K9041 Non-celiac gluten sensitivity: Secondary | ICD-10-CM | POA: Diagnosis present

## 2017-04-02 DIAGNOSIS — Z888 Allergy status to other drugs, medicaments and biological substances status: Secondary | ICD-10-CM

## 2017-04-02 DIAGNOSIS — G9341 Metabolic encephalopathy: Secondary | ICD-10-CM | POA: Diagnosis present

## 2017-04-02 DIAGNOSIS — Z961 Presence of intraocular lens: Secondary | ICD-10-CM | POA: Diagnosis present

## 2017-04-02 DIAGNOSIS — Z66 Do not resuscitate: Secondary | ICD-10-CM | POA: Diagnosis present

## 2017-04-02 DIAGNOSIS — E871 Hypo-osmolality and hyponatremia: Secondary | ICD-10-CM | POA: Diagnosis present

## 2017-04-02 DIAGNOSIS — Z9842 Cataract extraction status, left eye: Secondary | ICD-10-CM

## 2017-04-02 DIAGNOSIS — Z885 Allergy status to narcotic agent status: Secondary | ICD-10-CM

## 2017-04-02 DIAGNOSIS — E860 Dehydration: Secondary | ICD-10-CM | POA: Diagnosis present

## 2017-04-02 DIAGNOSIS — E875 Hyperkalemia: Secondary | ICD-10-CM | POA: Diagnosis present

## 2017-04-02 DIAGNOSIS — Z884 Allergy status to anesthetic agent status: Secondary | ICD-10-CM

## 2017-04-02 DIAGNOSIS — Z8701 Personal history of pneumonia (recurrent): Secondary | ICD-10-CM

## 2017-04-02 DIAGNOSIS — Z7901 Long term (current) use of anticoagulants: Secondary | ICD-10-CM

## 2017-04-02 DIAGNOSIS — Z95810 Presence of automatic (implantable) cardiac defibrillator: Secondary | ICD-10-CM

## 2017-04-02 DIAGNOSIS — Z8249 Family history of ischemic heart disease and other diseases of the circulatory system: Secondary | ICD-10-CM

## 2017-04-02 LAB — URINALYSIS, ROUTINE W REFLEX MICROSCOPIC
BILIRUBIN URINE: NEGATIVE
Glucose, UA: NEGATIVE mg/dL
Hgb urine dipstick: NEGATIVE
KETONES UR: NEGATIVE mg/dL
LEUKOCYTES UA: NEGATIVE
Nitrite: NEGATIVE
PROTEIN: NEGATIVE mg/dL
Specific Gravity, Urine: 1.018 (ref 1.005–1.030)
pH: 8 (ref 5.0–8.0)

## 2017-04-02 LAB — CBC
HEMATOCRIT: 35.4 % — AB (ref 36.0–46.0)
Hemoglobin: 12.4 g/dL (ref 12.0–15.0)
MCH: 31.8 pg (ref 26.0–34.0)
MCHC: 35 g/dL (ref 30.0–36.0)
MCV: 90.8 fL (ref 78.0–100.0)
PLATELETS: 280 10*3/uL (ref 150–400)
RBC: 3.9 MIL/uL (ref 3.87–5.11)
RDW: 13.5 % (ref 11.5–15.5)
WBC: 8.2 10*3/uL (ref 4.0–10.5)

## 2017-04-02 LAB — COMPREHENSIVE METABOLIC PANEL
ALBUMIN: 3.8 g/dL (ref 3.5–5.0)
ALT: 20 U/L (ref 14–54)
AST: 27 U/L (ref 15–41)
Alkaline Phosphatase: 68 U/L (ref 38–126)
Anion gap: 7 (ref 5–15)
BILIRUBIN TOTAL: 0.7 mg/dL (ref 0.3–1.2)
BUN: 14 mg/dL (ref 6–20)
CO2: 24 mmol/L (ref 22–32)
CREATININE: 0.65 mg/dL (ref 0.44–1.00)
Calcium: 8.9 mg/dL (ref 8.9–10.3)
Chloride: 102 mmol/L (ref 101–111)
GFR calc Af Amer: >60 mL/min (ref >60)
GLUCOSE: 107 mg/dL — AB (ref 65–99)
Potassium: 4.6 mmol/L (ref 3.5–5.1)
Sodium: 133 mmol/L — ABNORMAL LOW (ref 135–145)
TOTAL PROTEIN: 6.2 g/dL — AB (ref 6.5–8.1)

## 2017-04-02 MED ORDER — CEFEPIME HCL 1 G IJ SOLR: 1.0000 g | Freq: Once | INTRAMUSCULAR | Status: DC

## 2017-04-02 MED ORDER — IOPAMIDOL (ISOVUE-300) INJECTION 61%: 75.0000 mL | Freq: Once | INTRAVENOUS | Status: DC | PRN

## 2017-04-02 MED ORDER — IOPAMIDOL (ISOVUE-300) INJECTION 61%
INTRAVENOUS | Status: AC
  Filled 2017-04-02: qty 75

## 2017-04-02 MED ORDER — PIPERACILLIN-TAZOBACTAM 3.375 G IVPB
3.3750 g | Freq: Three times a day (TID) | INTRAVENOUS | Status: DC
  Administered 2017-04-02: 3.375 g via INTRAVENOUS
  Filled 2017-04-02: qty 50

## 2017-04-02 NOTE — ED Provider Notes (Signed)
[facility]-EMERGENCY DEPT Provider Note   CSN: [id number] Arrival date & time: 04/02/17  1932     History   Chief Complaint Chief Complaint  Patient presents with  . Altered Mental Status    HPI Melanie Obrien is a 83 y.o. female.  HPI   83 yo F with PMHx as below here with AMS. Pt reportedly has been increasingly confused since tooth extraction 6 weeks ago. Pt was previously very high functioning and lived in ALF but was o/w independent. She had a "cold" 6 weeks ago and also underwent tooth extraction. Since then, she has been progressively declining. She was also admitted for hyponatremia which improved with gentle hydration. According to pt's daughter, a physician, pt has not been back to baseline since then but has also had significant functional decline in the past several days. She has had decreased attention span, level of consciousness, and is now unable to hold conversation. She seems focused on numbers and describes people, objects as numbers then becomes confused. She denies any specific complaints but also is unable to provide history as to why she is in the ED. She states that her daughter thinks she is "not thinking right."  Level 5 caveat invoked as remainder of history, ROS, and physical exam limited due to patient's AMS.   Past Medical History:  Diagnosis Date  . Acute on chronic diastolic heart failure (HCC)   . AICD (automatic cardioverter/defibrillator) present   . Arthritis    "plenty" (06/17/2015)  . Atrial fibrillation (HCC)   . CHF (congestive heart failure) (HCC)   . Chronic back pain   . Chronic kidney disease    02/17/16 Bun 34, creat 1.05  . Complication of anesthesia    "30 minutes violent shaking after a dental procedure"   . Degenerative disc disease, lumbar    . Diastolic heart failure (HCC)   . Gluten intolerance    . Heart murmur   . Hypertension   . Hypothyroidism    "from the aminodarone"  . Idiopathic scoliosis    . Osteoporosis   .  Paroxysmal atrial fibrillation (HCC)   . Pleural effusion 2011   "hugh w/pneumonia"  . Pneumonia 2011; 11/2014  . Sinus bradycardia   . Stroke [facility])    TIA  . Thyroid disease   . TIA (transient ischemic attack) 2000's X 1  . Trigeminal neuralgia of left side of face 12/13/2014  . Unstable gait      Patient Active Problem List   Diagnosis Date Noted  . AOM (acute otitis media) 04/03/2017  . Encephalopathy 03/24/2017  . Hyponatremia 03/24/2017  . Acute encephalopathy 03/24/2017  . Pressure injury of skin 03/24/2017  . Aortic stenosis 10/17/2016  . Chronic kidney disease   . Paranoia (psychosis) (HCC) 02/15/2016  . Acute bronchitis 02/04/2016  . Fatigue 06/17/2015  . Hypothyroidism 05/18/2015  . Unstable gait 04/24/2015  . Degenerative disc disease, lumbar 04/24/2015  . Idiopathic scoliosis 04/24/2015  . Weakness 04/24/2015  . Dyspnea 04/24/2015  . Gluten intolerance 04/24/2015  . HTN (hypertension) 04/20/2015  . Fracture of humeral head, left, closed 04/17/2015  . Fall   . Other seasonal allergic rhinitis   . Malnutrition of moderate degree (HCC) 01/21/2015  . Hepatopathy - congestive 01/20/2015  . Chronic diastolic congestive heart failure (HCC) 01/18/2015  . Lower extremity edema   . Paroxysmal atrial fibrillation (HCC)   . Elevated LFTs 01/16/2015  . Syncope 01/16/2015  . SOB (shortness of breath)   . Atrial fibrillation (  HCC) 12/13/2014  . TIA (transient ischemic attack) 12/13/2014  . Trigeminal neuralgia of left side of face 12/13/2014  . Hot flashes 12/13/2014    Past Surgical History:  Procedure Laterality Date  . BI-VENTRICULAR IMPLANTABLE CARDIOVERTER DEFIBRILLATOR  (CRT-D)  06/17/2015  . CATARACT EXTRACTION W/ INTRAOCULAR LENS  IMPLANT, BILATERAL Bilateral 1991-1992  . EP IMPLANTABLE DEVICE N/A 06/17/2015   Procedure: Pacemaker Implant;  Surgeon: Duke Salvia, MD;  Location: [facility] INVASIVE CV LAB;  Service: Cardiovascular;  Laterality: N/A;  . LOOP  RECORDER IMPLANT  08/2013   explanted 06/17/2015  . PACEMAKER INSERTION    . TOENAIL AVULSION Left    2nd digit  . TONSILLECTOMY  1944    OB History    Gravida Para Term Preterm AB Living   0 0 0 0 0     SAB TAB Ectopic Multiple Live Births   0 0 0           Home Medications    Prior to Admission medications   Medication Sig Start Date End Date Taking? Authorizing Provider  acetaminophen (TYLENOL) 325 MG tablet Take 650 mg by mouth every 6 (six) hours as needed (for pain/fever).   Yes Historical Provider, MD  apixaban (ELIQUIS) 2.5 MG TABS tablet Take 1 tablet (2.5 mg total) by mouth 2 (two) times daily. 07/13/15  Yes Duke Salvia, MD  B Complex-Biotin-FA (SUPER B-50 COMPLEX PO) Take 1 tablet by mouth daily.   Yes Historical Provider, MD  CALCIUM PO Take 1,000 mg by mouth daily.   Yes Historical Provider, MD  cholecalciferol (VITAMIN D) 1000 units tablet Take 1,000 Units by mouth daily.   Yes Historical Provider, MD  Chromium Picolinate (CHROMIUM PICOLATE PO) Take 1 tablet by mouth daily.   Yes Historical Provider, MD  levothyroxine (SYNTHROID, LEVOTHROID) 75 MCG tablet Take 1 tablet (75 mcg total) by mouth daily before breakfast. Reported on 02/04/2016 03/25/17  Yes Penny Pia, MD  lisinopril (PRINIVIL,ZESTRIL) 5 MG tablet Take 1 tablet (5 mg total) by mouth daily. 12/07/15  Yes Corky Crafts, MD  Magnesium 250 MG TABS Take 250 mg by mouth daily.    Yes Historical Provider, MD  metoprolol tartrate (LOPRESSOR) 25 MG tablet Take 1 tablet (25 mg total) by mouth 2 (two) times daily. 03/21/16  Yes Corky Crafts, MD  PANTOTHENIC ACID PO Take 100 mg by mouth daily.   Yes Historical Provider, MD    Family History Family History  Problem Relation Age of Onset  . Stroke Mother   . Hypertension Father   . Heart attack Neg Hx     Social History Social History  Substance Use Topics  . Smoking status: Never Smoker  . Smokeless tobacco: Never Used  . Alcohol use No      Allergies   Codeine; Codeine; Levaquin [levofloxacin in d5w]; Levaquin [levofloxacin in d5w]; Loratadine; Xylocaine [lidocaine hcl]; Xylocaine [lidocaine hcl]; and Loratadine   Review of Systems Review of Systems  Unable to perform ROS: Mental status change     Physical Exam Updated Vital Signs BP (!) 138/53 (BP Location: Left Arm)   Pulse 85   Temp 98.4 F (36.9 C) (Oral)   Resp 16   Ht  (1.397 m)   Wt 98 lb 1.7 oz (44.5 kg)   SpO2 97%   BMI 22.80 kg/m   Physical Exam  Constitutional: She appears well-developed and well-nourished. No distress.  HENT:  Head: Normocephalic and atraumatic.  Mouth/Throat: Oropharynx is clear and  moist.  Right TM with serous effusion, small air bubbles but no erythema or opacification. Left TM opaque, bulging but not particularly erythematous. There is a purulent fluid level however. No mastoid erythema or asymmetry. No mastoid TTP.  Eyes: Conjunctivae are normal.  Neck: Neck supple.  Cardiovascular: Normal rate, regular rhythm and normal heart sounds.  Exam reveals no friction rub.   No murmur heard. Pulmonary/Chest: Effort normal and breath sounds normal. No respiratory distress. She has no wheezes. She has no rales.  Abdominal: She exhibits no distension.  Musculoskeletal: She exhibits no edema.  Neurological: She exhibits normal muscle tone.  Alert but confused. Follows commands but is focused on her right hand mostly, intermittently loses train of thought. MAE with at least antigravity strength. Face appears symmetric. Sensation grossly intact.  Skin: Skin is warm. Capillary refill takes less than 2 seconds.  Psychiatric: She has a normal mood and affect.  Nursing note and vitals reviewed.    ED Treatments / Results  Labs (all labs ordered are listed, but only abnormal results are displayed) Labs Reviewed  COMPREHENSIVE METABOLIC PANEL - Abnormal; Notable for the following:       Result Value   Sodium 133 (*)     Glucose, Bld 107 (*)    Total Protein 6.2 (*)    All other components within normal limits  CBC - Abnormal; Notable for the following:    HCT 35.4 (*)    All other components within normal limits  CULTURE, BLOOD (ROUTINE X 2)  CULTURE, BLOOD (ROUTINE X 2)  URINALYSIS, ROUTINE W REFLEX MICROSCOPIC    EKG  EKG Interpretation  Date/Time:  Monday April 02 2017 20:21:03 EDT Ventricular Rate:  76 PR Interval:    QRS Duration: 103 QT Interval:  390 QTC Calculation: 439 R Axis:   -67 Text Interpretation:  Sinus rhythm Atrial premature complexes Left anterior fascicular block No significant change since last tracing Confirmed by LITTLE MD, RACHEL [phone]) on 04/03/2017 3:02:55 PM       Radiology Ct Head Wo Contrast  Result Date: 04/02/2017 CLINICAL DATA:  Dementia, altered mental status. follow-up left middle ear effusion. EXAM: CT HEAD WITHOUT CONTRAST CT TEMPORAL BONES WITH CONTRAST TECHNIQUE: Contiguous axial images were obtained from the base of the skull through the vertex without contrast. Multidetector CT imaging of the temporal bones was performed using the standard protocol with intravenous contrast. CONTRAST:  75 CC ISOVUE 300 COMPARISON:  CT HEAD March 24, 2017 FINDINGS: CT HEAD FINDINGS BRAIN: No intraparenchymal hemorrhage, mass effect nor midline shift. The ventricles and sulci are normal for age. Patchy supratentorial white matter hypodensities within normal range for patient's age, though non-specific are most compatible with chronic small vessel ischemic disease. No acute large vascular territory infarcts. No abnormal extra-axial fluid collections. Basal cisterns are patent. VASCULAR: Moderate calcific atherosclerosis of the carotid siphons. SKULL: No skull fracture. No significant scalp soft tissue swelling. SINUSES/ORBITS: The paranasal sinuses are well-aerated.The included ocular globes and orbital contents are non-suspicious. Status post bilateral ocular lens implants. OTHER:  None. CT TEMPORAL BONES FINDINGS- moderately motion degraded examination. RIGHT: External auditory canal is well formed and well aerated. Tympanic membrane is not thickened or retracted. The scutum is sharp. Well aerated middle ear including Prussak's space. Ossicles are well formed and located. Patent aditus ad antrum. Well aerated mastoid air cells without coalescence. Intact tegmen tympani. Intact otic capsule with normal appearance of the inner ear structures. Please note, due to patient motion subtle areas of osteolysis  would not be detected. No internal auditory canal expansion. No definite cerebellar pontine angle masses. LEFT: External auditory canal is well formed and aerated. Tympanic membrane may be retracted though limited assessment due to motion. The scutum is sharp. Soft tissue throughout the middle ear including Prussak's space Ossicles are well formed and located. Patent aditus ad antrum. Soft tissue opacifies mastoid air cells without coalescence. Intact tegmen tympani. Intact otic capsule with normal appearance of the inner ear structures. Please note, due to patient motion subtle areas of osteolysis would not be detected. No internal auditory canal expansion. No definite cerebellar pontine angle masses. No obstructing nasopharyngeal mass. No dural venous sinus thrombosis a an included field-of-view. IMPRESSION: CT HEAD:  Stable negative CT HEAD for age. CT TEMPORAL BONES:  Moderately motion degraded examination. LEFT middle ear and mastoid effusion compatible with Eustation tube dysfunction without CT findings of mastoiditis or abscess. Electronically Signed   By: Awilda Metro M.D.   On: 04/02/2017 23:03   Ct Temporal Bones W Contrast  Result Date: 04/02/2017 CLINICAL DATA:  Dementia, altered mental status. follow-up left middle ear effusion. EXAM: CT HEAD WITHOUT CONTRAST CT TEMPORAL BONES WITH CONTRAST TECHNIQUE: Contiguous axial images were obtained from the base of the skull through  the vertex without contrast. Multidetector CT imaging of the temporal bones was performed using the standard protocol with intravenous contrast. CONTRAST:  75 CC ISOVUE 300 COMPARISON:  CT HEAD March 24, 2017 FINDINGS: CT HEAD FINDINGS BRAIN: No intraparenchymal hemorrhage, mass effect nor midline shift. The ventricles and sulci are normal for age. Patchy supratentorial white matter hypodensities within normal range for patient's age, though non-specific are most compatible with chronic small vessel ischemic disease. No acute large vascular territory infarcts. No abnormal extra-axial fluid collections. Basal cisterns are patent. VASCULAR: Moderate calcific atherosclerosis of the carotid siphons. SKULL: No skull fracture. No significant scalp soft tissue swelling. SINUSES/ORBITS: The paranasal sinuses are well-aerated.The included ocular globes and orbital contents are non-suspicious. Status post bilateral ocular lens implants. OTHER: None. CT TEMPORAL BONES FINDINGS- moderately motion degraded examination. RIGHT: External auditory canal is well formed and well aerated. Tympanic membrane is not thickened or retracted. The scutum is sharp. Well aerated middle ear including Prussak's space. Ossicles are well formed and located. Patent aditus ad antrum. Well aerated mastoid air cells without coalescence. Intact tegmen tympani. Intact otic capsule with normal appearance of the inner ear structures. Please note, due to patient motion subtle areas of osteolysis would not be detected. No internal auditory canal expansion. No definite cerebellar pontine angle masses. LEFT: External auditory canal is well formed and aerated. Tympanic membrane may be retracted though limited assessment due to motion. The scutum is sharp. Soft tissue throughout the middle ear including Prussak's space Ossicles are well formed and located. Patent aditus ad antrum. Soft tissue opacifies mastoid air cells without coalescence. Intact tegmen  tympani. Intact otic capsule with normal appearance of the inner ear structures. Please note, due to patient motion subtle areas of osteolysis would not be detected. No internal auditory canal expansion. No definite cerebellar pontine angle masses. No obstructing nasopharyngeal mass. No dural venous sinus thrombosis a an included field-of-view. IMPRESSION: CT HEAD:  Stable negative CT HEAD for age. CT TEMPORAL BONES:  Moderately motion degraded examination. LEFT middle ear and mastoid effusion compatible with Eustation tube dysfunction without CT findings of mastoiditis or abscess. Electronically Signed   By: Awilda Metro M.D.   On: 04/02/2017 23:03    Procedures Procedures (including critical care  time)  Medications Ordered in ED Medications  acetaminophen (TYLENOL) tablet 650 mg (not administered)  cholecalciferol (VITAMIN D) tablet 1,000 Units (not administered)  magnesium oxide (MAG-OX) tablet 400 mg (not administered)  metoprolol tartrate (LOPRESSOR) tablet 25 mg (not administered)  lisinopril (PRINIVIL,ZESTRIL) tablet 5 mg (not administered)  apixaban (ELIQUIS) tablet 2.5 mg (not administered)  polyethylene glycol (MIRALAX / GLYCOLAX) packet 17 g (not administered)  bisacodyl (DULCOLAX) EC tablet 5 mg (not administered)  ondansetron (ZOFRAN) tablet 4 mg (not administered)    Or  ondansetron (ZOFRAN) injection 4 mg (not administered)  Ampicillin-Sulbactam (UNASYN) 3 g in sodium chloride 0.9 % 100 mL IVPB (not administered)  levothyroxine (SYNTHROID, LEVOTHROID) tablet 75 mcg (not administered)     Initial Impression / Assessment and Plan / ED Course  I have reviewed the triage vital signs and the nursing notes.  Pertinent labs & imaging results that were available during my care of the patient were reviewed by me and considered in my medical decision making (see chart for details).     83 yo F with PMHx as above here with AMS, decreased concentration. Suspect delirium, likely  2/2 AOM versus sinusitis versus medication effect. Sx seemed to have correlated with recent dental extraction and left TM opaque, with effusion on CT. There is mastoid erythema as well but no bony lesions, no redness, no clinical evidence to suggest meningitis, mastoiditis, or encephalitis. Pt given abX, will admit for further work-up. Labs o/w overall reassuring. Hyponatremia is improving.  Final Clinical Impressions(s) / ED Diagnoses   Final diagnoses:  Acute suppurative otitis media of left ear without spontaneous rupture of tympanic membrane, recurrence not specified  Delirium    New Prescriptions Current Discharge Medication List       Shaune Pollack, MD 04/03/17 1644

## 2017-04-02 NOTE — ED Triage Notes (Signed)
Pt's daughter states she went to see her mother today at the nursing home and her mother was confused using numbers and directions such as [place], [address], [place], and [place] in her sentences  Daughter states pt was not like that yesterday   Pt was not able to tell her what she had for lunch  Daughter states pt's mental status has been declining since she had some teeth extracted 6 weeks ago

## 2017-04-03 ENCOUNTER — Telehealth: Payer: Self-pay | Admitting: Cardiology

## 2017-04-03 ENCOUNTER — Encounter: Payer: Medicare PPO | Admitting: *Deleted

## 2017-04-03 ENCOUNTER — Encounter (HOSPITAL_COMMUNITY): Payer: Self-pay | Admitting: Family Medicine

## 2017-04-03 DIAGNOSIS — I480 Paroxysmal atrial fibrillation: Secondary | ICD-10-CM

## 2017-04-03 DIAGNOSIS — I130 Hypertensive heart and chronic kidney disease with heart failure and stage 1 through stage 4 chronic kidney disease, or unspecified chronic kidney disease: Secondary | ICD-10-CM | POA: Diagnosis present

## 2017-04-03 DIAGNOSIS — E871 Hypo-osmolality and hyponatremia: Secondary | ICD-10-CM

## 2017-04-03 DIAGNOSIS — E032 Hypothyroidism due to medicaments and other exogenous substances: Secondary | ICD-10-CM

## 2017-04-03 DIAGNOSIS — E875 Hyperkalemia: Secondary | ICD-10-CM | POA: Diagnosis not present

## 2017-04-03 DIAGNOSIS — I444 Left anterior fascicular block: Secondary | ICD-10-CM | POA: Diagnosis present

## 2017-04-03 DIAGNOSIS — G8929 Other chronic pain: Secondary | ICD-10-CM | POA: Diagnosis present

## 2017-04-03 DIAGNOSIS — E039 Hypothyroidism, unspecified: Secondary | ICD-10-CM | POA: Diagnosis present

## 2017-04-03 DIAGNOSIS — Z8701 Personal history of pneumonia (recurrent): Secondary | ICD-10-CM | POA: Diagnosis not present

## 2017-04-03 DIAGNOSIS — Z9842 Cataract extraction status, left eye: Secondary | ICD-10-CM | POA: Diagnosis not present

## 2017-04-03 DIAGNOSIS — E860 Dehydration: Secondary | ICD-10-CM | POA: Diagnosis present

## 2017-04-03 DIAGNOSIS — H66002 Acute suppurative otitis media without spontaneous rupture of ear drum, left ear: Secondary | ICD-10-CM | POA: Diagnosis present

## 2017-04-03 DIAGNOSIS — I5032 Chronic diastolic (congestive) heart failure: Secondary | ICD-10-CM | POA: Diagnosis not present

## 2017-04-03 DIAGNOSIS — M4120 Other idiopathic scoliosis, site unspecified: Secondary | ICD-10-CM | POA: Diagnosis present

## 2017-04-03 DIAGNOSIS — H6500 Acute serous otitis media, unspecified ear: Secondary | ICD-10-CM | POA: Diagnosis not present

## 2017-04-03 DIAGNOSIS — Z8673 Personal history of transient ischemic attack (TIA), and cerebral infarction without residual deficits: Secondary | ICD-10-CM | POA: Diagnosis not present

## 2017-04-03 DIAGNOSIS — M810 Age-related osteoporosis without current pathological fracture: Secondary | ICD-10-CM | POA: Diagnosis present

## 2017-04-03 DIAGNOSIS — G9341 Metabolic encephalopathy: Secondary | ICD-10-CM | POA: Diagnosis present

## 2017-04-03 DIAGNOSIS — Z9841 Cataract extraction status, right eye: Secondary | ICD-10-CM | POA: Diagnosis not present

## 2017-04-03 DIAGNOSIS — M549 Dorsalgia, unspecified: Secondary | ICD-10-CM | POA: Diagnosis present

## 2017-04-03 DIAGNOSIS — K9041 Non-celiac gluten sensitivity: Secondary | ICD-10-CM | POA: Diagnosis present

## 2017-04-03 DIAGNOSIS — Z66 Do not resuscitate: Secondary | ICD-10-CM | POA: Diagnosis present

## 2017-04-03 DIAGNOSIS — E038 Other specified hypothyroidism: Secondary | ICD-10-CM | POA: Diagnosis not present

## 2017-04-03 DIAGNOSIS — R410 Disorientation, unspecified: Secondary | ICD-10-CM | POA: Diagnosis present

## 2017-04-03 DIAGNOSIS — G9340 Encephalopathy, unspecified: Secondary | ICD-10-CM | POA: Diagnosis not present

## 2017-04-03 DIAGNOSIS — N189 Chronic kidney disease, unspecified: Secondary | ICD-10-CM | POA: Diagnosis present

## 2017-04-03 DIAGNOSIS — Z961 Presence of intraocular lens: Secondary | ICD-10-CM | POA: Diagnosis present

## 2017-04-03 DIAGNOSIS — Z95810 Presence of automatic (implantable) cardiac defibrillator: Secondary | ICD-10-CM | POA: Diagnosis not present

## 2017-04-03 DIAGNOSIS — H6690 Otitis media, unspecified, unspecified ear: Secondary | ICD-10-CM | POA: Diagnosis present

## 2017-04-03 DIAGNOSIS — Z8249 Family history of ischemic heart disease and other diseases of the circulatory system: Secondary | ICD-10-CM | POA: Diagnosis not present

## 2017-04-03 LAB — GLUCOSE, CAPILLARY: Glucose-Capillary: 85 mg/dL (ref 65–99)

## 2017-04-03 LAB — MRSA PCR SCREENING: MRSA BY PCR: NEGATIVE

## 2017-04-03 MED ORDER — SODIUM CHLORIDE 0.9 % IV SOLN
INTRAVENOUS | Status: DC
  Administered 2017-04-03: 1000 mL via INTRAVENOUS
  Administered 2017-04-04: 10:00:00 via INTRAVENOUS

## 2017-04-03 MED ORDER — BISACODYL 5 MG PO TBEC: 5.0000 mg | DELAYED_RELEASE_TABLET | Freq: Every day | ORAL | Status: DC | PRN

## 2017-04-03 MED ORDER — VITAMIN D3 25 MCG (1000 UNIT) PO TABS
1000.0000 [IU] | ORAL_TABLET | Freq: Every day | ORAL | Status: DC
  Administered 2017-04-03 – 2017-04-05 (×3): 1000 [IU] via ORAL
  Filled 2017-04-03 (×3): qty 1

## 2017-04-03 MED ORDER — METOPROLOL TARTRATE 25 MG PO TABS
25.0000 mg | ORAL_TABLET | Freq: Two times a day (BID) | ORAL | Status: DC
  Administered 2017-04-03 – 2017-04-05 (×4): 25 mg via ORAL
  Filled 2017-04-03 (×5): qty 1

## 2017-04-03 MED ORDER — POLYETHYLENE GLYCOL 3350 17 G PO PACK: 17.0000 g | PACK | Freq: Every day | ORAL | Status: DC | PRN

## 2017-04-03 MED ORDER — MAGNESIUM OXIDE 400 (241.3 MG) MG PO TABS
400.0000 mg | ORAL_TABLET | Freq: Every day | ORAL | Status: DC
  Administered 2017-04-03 – 2017-04-05 (×3): 400 mg via ORAL
  Filled 2017-04-03 (×3): qty 1

## 2017-04-03 MED ORDER — SODIUM CHLORIDE 0.9 % IV SOLN
3.0000 g | Freq: Two times a day (BID) | INTRAVENOUS | Status: DC
  Administered 2017-04-03 – 2017-04-05 (×5): 3 g via INTRAVENOUS
  Filled 2017-04-03 (×6): qty 3

## 2017-04-03 MED ORDER — ONDANSETRON HCL 4 MG/2ML IJ SOLN: 4.0000 mg | Freq: Four times a day (QID) | INTRAMUSCULAR | Status: DC | PRN

## 2017-04-03 MED ORDER — ONDANSETRON HCL 4 MG PO TABS: 4.0000 mg | ORAL_TABLET | Freq: Four times a day (QID) | ORAL | Status: DC | PRN

## 2017-04-03 MED ORDER — LEVOTHYROXINE SODIUM 50 MCG PO TABS
75.0000 ug | ORAL_TABLET | Freq: Every day | ORAL | Status: DC
  Administered 2017-04-03 – 2017-04-05 (×3): 75 ug via ORAL
  Filled 2017-04-03 (×3): qty 1

## 2017-04-03 MED ORDER — LEVOTHYROXINE SODIUM 75 MCG PO TABS: 75.0000 ug | ORAL_TABLET | Freq: Every day | ORAL | Status: DC

## 2017-04-03 MED ORDER — ACETAMINOPHEN 325 MG PO TABS: 650.0000 mg | ORAL_TABLET | Freq: Four times a day (QID) | ORAL | Status: DC | PRN

## 2017-04-03 MED ORDER — LISINOPRIL 5 MG PO TABS
5.0000 mg | ORAL_TABLET | Freq: Every day | ORAL | Status: DC
  Administered 2017-04-03 – 2017-04-05 (×3): 5 mg via ORAL
  Filled 2017-04-03 (×3): qty 1

## 2017-04-03 MED ORDER — APIXABAN 2.5 MG PO TABS
2.5000 mg | ORAL_TABLET | Freq: Two times a day (BID) | ORAL | Status: DC
  Administered 2017-04-03 – 2017-04-05 (×5): 2.5 mg via ORAL
  Filled 2017-04-03 (×5): qty 1

## 2017-04-03 NOTE — H&P (Signed)
History and Physical    KERRIANNE JENG ZOX:[id number] DOB: [birth year] DOA: 04/02/2017  PCP: Gaye Alken, MD   Patient coming from: SNF  Chief Complaint: Confusion   HPI: Melanie Obrien is a 83 y.o. female with medical history significant for paroxysmal atrial fibrillation on Eliquis, chronic diastolic CHF, osteoarthritis, history of TIA, and reported gluten intolerance who presents to the emergency department from her SNF for evaluation of confusion. Patient is accompanied by her daughter who provides most of the history. Patient had reportedly been in her usual state of health with only very mild confusion yesterday, but when her daughter visited her today, she was markedly confused and her speech content was nonsensical. Patient was discharged from the hospital just over a week ago after admission for acute encephalopathy in which she was found to be severely hyponatremic. Mental status improved dramatically with normalization of her serum sodium level and she was discharged to SNF. Patient's daughter reports that she had seemed to be doing well at the nursing facility until today. There is been no fevers reported at the SNF and no fall or trauma. Patient recently had her Synthroid reduced slightly and has been taken off torsemide, but no other recent medication changes. There's been no seizure-like activity and no focal weakness appreciated.  ED Course: Upon arrival to the ED, patient is found to be afebrile, saturating well on room air, and with vital signs stable. EKG features sinus rhythm with PACs and left anterior fascicular block. Noncontrast head CT is negative for acute intracranial abnormality. Chemistry panel reveals a serum sodium of 133 and CBC is unremarkable. Urinalysis also appears to be unremarkable. Patient was noted to have an opaque left tympanic membrane with pain on manipulation of the auricle and a CT of the temporal bones was obtained, revealing a left-sided effusion  but no abscess. Patient was given a dose of empiric Zosyn for suspected left AOM. Patient remained clinically stable and in no apparent respiratory distress, but very confused in the emergency department. She will be observed on medical/surgical unit for ongoing evaluation and management of acute encephalopathy, possibly secondary to acute middle ear infection.  Review of Systems:  All other systems reviewed and apart from HPI, are negative.  Past Medical History:  Diagnosis Date  . Acute on chronic diastolic heart failure (HCC)   . AICD (automatic cardioverter/defibrillator) present   . Arthritis    "plenty" (06/17/2015)  . Atrial fibrillation (HCC)   . CHF (congestive heart failure) (HCC)   . Chronic back pain   . Chronic kidney disease    02/17/16 Bun 34, creat 1.05  . Complication of anesthesia    "30 minutes violent shaking after a dental procedure"   . Degenerative disc disease, lumbar    . Diastolic heart failure (HCC)   . Gluten intolerance    . Heart murmur   . Hypertension   . Hypothyroidism    "from the aminodarone"  . Idiopathic scoliosis    . Osteoporosis   . Paroxysmal atrial fibrillation (HCC)   . Pleural effusion 2011   "hugh w/pneumonia"  . Pneumonia 2011; 11/2014  . Sinus bradycardia   . Stroke [facility])    TIA  . Thyroid disease   . TIA (transient ischemic attack) 2000's X 1  . Trigeminal neuralgia of left side of face 12/13/2014  . Unstable gait      Past Surgical History:  Procedure Laterality Date  . BI-VENTRICULAR IMPLANTABLE CARDIOVERTER DEFIBRILLATOR  (CRT-D)  06/17/2015  .  CATARACT EXTRACTION W/ INTRAOCULAR LENS  IMPLANT, BILATERAL Bilateral 1991-1992  . EP IMPLANTABLE DEVICE N/A 06/17/2015   Procedure: Pacemaker Implant;  Surgeon: Duke Salvia, MD;  Location: [facility] INVASIVE CV LAB;  Service: Cardiovascular;  Laterality: N/A;  . LOOP RECORDER IMPLANT  08/2013   explanted 06/17/2015  . PACEMAKER INSERTION    . TOENAIL AVULSION Left    2nd digit  .  TONSILLECTOMY  1944     reports that she has never smoked. She has never used smokeless tobacco. She reports that she does not drink alcohol or use drugs.  Allergies  Allergen Reactions  . Codeine Other (See Comments)    unknown  . Codeine Nausea And Vomiting    & headaches  . Levaquin [Levofloxacin In D5w] Itching  . Levaquin [Levofloxacin In D5w] Swelling and Other (See Comments)    & redness  . Loratadine Other (See Comments)    TACHYCARDIA  . Xylocaine [Lidocaine Hcl] Other (See Comments)    Uncontrolled shaking  . Xylocaine [Lidocaine Hcl] Other (See Comments)    Made her very cold & lots of shaking. Pt and daughter stated that she can take it.  . Loratadine Palpitations    Family History  Problem Relation Age of Onset  . Stroke Mother   . Hypertension Father   . Heart attack Neg Hx      Prior to Admission medications   Medication Sig Start Date End Date Taking? Authorizing Provider  acetaminophen (TYLENOL) 325 MG tablet Take 650 mg by mouth every 6 (six) hours as needed (for pain/fever).   Yes Historical Provider, MD  apixaban (ELIQUIS) 2.5 MG TABS tablet Take 1 tablet (2.5 mg total) by mouth 2 (two) times daily. 07/13/15  Yes Duke Salvia, MD  B Complex-Biotin-FA (SUPER B-50 COMPLEX PO) Take 1 tablet by mouth daily.   Yes Historical Provider, MD  CALCIUM PO Take 1,000 mg by mouth daily.   Yes Historical Provider, MD  cholecalciferol (VITAMIN D) 1000 units tablet Take 1,000 Units by mouth daily.   Yes Historical Provider, MD  Chromium Picolinate (CHROMIUM PICOLATE PO) Take 1 tablet by mouth daily.   Yes Historical Provider, MD  levothyroxine (SYNTHROID, LEVOTHROID) 75 MCG tablet Take 1 tablet (75 mcg total) by mouth daily before breakfast. Reported on 02/04/2016 03/25/17  Yes Penny Pia, MD  lisinopril (PRINIVIL,ZESTRIL) 5 MG tablet Take 1 tablet (5 mg total) by mouth daily. 12/07/15  Yes Corky Crafts, MD  Magnesium 250 MG TABS Take 250 mg by mouth daily.    Yes  Historical Provider, MD  metoprolol tartrate (LOPRESSOR) 25 MG tablet Take 1 tablet (25 mg total) by mouth 2 (two) times daily. 03/21/16  Yes Corky Crafts, MD  PANTOTHENIC ACID PO Take 100 mg by mouth daily.   Yes Historical Provider, MD    Physical Exam: Vitals:   04/02/17 1959 04/02/17 2212 04/03/17 0007  BP: (!) 165/60 (!) 155/67 (!) 144/52  Pulse: 75 87 90  Resp: 18 18 12   Temp: 97.8 F (36.6 C)    TempSrc: Oral    SpO2: 97% 97% 97%      Constitutional: NAD, appears anxious, uncomfortable  Eyes: PERTLA, lids and conjunctivae normal ENMT: Mucous membranes are moist. Posterior pharynx clear of any exudate or lesions.   Neck: normal, supple, no masses, no thyromegaly Respiratory: clear to auscultation bilaterally, no wheezing, no crackles. Normal respiratory effort.   Cardiovascular: S1 & S2 heard, regular rate and rhythm, loud systolic murmurs at upper  sternal border and apex. No extremity edema. No significant JVD. Abdomen: No distension, no tenderness, no masses palpated. Bowel sounds normal.  Musculoskeletal: no clubbing / cyanosis. No joint deformity upper and lower extremities.   Skin: no significant rashes, lesions, ulcers. Warm, dry, well-perfused. Neurologic: CN 2-12 grossly intact. Sensation intact, DTR normal. Strength 5/5 in all 4 limbs. Speech fluent, but nonsensical at times. Psychiatric: Alert and oriented to person and place, but not oriented to month or year.    Labs on Admission: I have personally reviewed following labs and imaging studies  CBC:  Recent Labs Lab 04/02/17 2027  WBC 8.2  HGB 12.4  HCT 35.4*  MCV 90.8  PLT 280   Basic Metabolic Panel:  Recent Labs Lab 04/02/17 2027  NA 133*  K 4.6  CL 102  CO2 24  GLUCOSE 107*  BUN 14  CREATININE 0.65  CALCIUM 8.9   GFR: Estimated Creatinine Clearance: 25.2 mL/min (by C-G formula based on SCr of 0.65 mg/dL). Liver Function Tests:  Recent Labs Lab 04/02/17 2027  AST 27  ALT 20    ALKPHOS 68  BILITOT 0.7  PROT 6.2*  ALBUMIN 3.8   No results for input(s): LIPASE, AMYLASE in the last 168 hours. No results for input(s): AMMONIA in the last 168 hours. Coagulation Profile: No results for input(s): INR, PROTIME in the last 168 hours. Cardiac Enzymes: No results for input(s): CKTOTAL, CKMB, CKMBINDEX, TROPONINI in the last 168 hours. BNP (last 3 results) No results for input(s): PROBNP in the last 8760 hours. HbA1C: No results for input(s): HGBA1C in the last 72 hours. CBG: No results for input(s): GLUCAP in the last 168 hours. Lipid Profile: No results for input(s): CHOL, HDL, LDLCALC, TRIG, CHOLHDL, LDLDIRECT in the last 72 hours. Thyroid Function Tests: No results for input(s): TSH, T4TOTAL, FREET4, T3FREE, THYROIDAB in the last 72 hours. Anemia Panel: No results for input(s): VITAMINB12, FOLATE, FERRITIN, TIBC, IRON, RETICCTPCT in the last 72 hours. Urine analysis:    Component Value Date/Time   COLORURINE YELLOW 04/02/2017 2133   APPEARANCEUR CLEAR 04/02/2017 2133   LABSPEC 1.018 04/02/2017 2133   PHURINE 8.0 04/02/2017 2133   GLUCOSEU NEGATIVE 04/02/2017 2133   HGBUR NEGATIVE 04/02/2017 2133   BILIRUBINUR NEGATIVE 04/02/2017 2133   KETONESUR NEGATIVE 04/02/2017 2133   PROTEINUR NEGATIVE 04/02/2017 2133   UROBILINOGEN 0.2 01/09/2016 1545   NITRITE NEGATIVE 04/02/2017 2133   LEUKOCYTESUR NEGATIVE 04/02/2017 2133   Sepsis Labs: (procalcitonin:4,lacticidven:4) ) Recent Results (from the past 240 hour(s))  MRSA PCR Screening     Status: None   Collection Time: 03/24/17  4:53 PM  Result Value Ref Range Status   MRSA by PCR NEGATIVE NEGATIVE Final    Comment:        The GeneXpert MRSA Assay (FDA approved for NASAL specimens only), is one component of a comprehensive MRSA colonization surveillance program. It is not intended to diagnose MRSA infection nor to guide or monitor treatment for MRSA infections.      Radiological Exams  on Admission: Ct Head Wo Contrast  Result Date: 04/02/2017 CLINICAL DATA:  Dementia, altered mental status. follow-up left middle ear effusion. EXAM: CT HEAD WITHOUT CONTRAST CT TEMPORAL BONES WITH CONTRAST TECHNIQUE: Contiguous axial images were obtained from the base of the skull through the vertex without contrast. Multidetector CT imaging of the temporal bones was performed using the standard protocol with intravenous contrast. CONTRAST:  75 CC ISOVUE 300 COMPARISON:  CT HEAD March 24, 2017 FINDINGS: CT HEAD  FINDINGS BRAIN: No intraparenchymal hemorrhage, mass effect nor midline shift. The ventricles and sulci are normal for age. Patchy supratentorial white matter hypodensities within normal range for patient's age, though non-specific are most compatible with chronic small vessel ischemic disease. No acute large vascular territory infarcts. No abnormal extra-axial fluid collections. Basal cisterns are patent. VASCULAR: Moderate calcific atherosclerosis of the carotid siphons. SKULL: No skull fracture. No significant scalp soft tissue swelling. SINUSES/ORBITS: The paranasal sinuses are well-aerated.The included ocular globes and orbital contents are non-suspicious. Status post bilateral ocular lens implants. OTHER: None. CT TEMPORAL BONES FINDINGS- moderately motion degraded examination. RIGHT: External auditory canal is well formed and well aerated. Tympanic membrane is not thickened or retracted. The scutum is sharp. Well aerated middle ear including Prussak's space. Ossicles are well formed and located. Patent aditus ad antrum. Well aerated mastoid air cells without coalescence. Intact tegmen tympani. Intact otic capsule with normal appearance of the inner ear structures. Please note, due to patient motion subtle areas of osteolysis would not be detected. No internal auditory canal expansion. No definite cerebellar pontine angle masses. LEFT: External auditory canal is well formed and aerated. Tympanic  membrane may be retracted though limited assessment due to motion. The scutum is sharp. Soft tissue throughout the middle ear including Prussak's space Ossicles are well formed and located. Patent aditus ad antrum. Soft tissue opacifies mastoid air cells without coalescence. Intact tegmen tympani. Intact otic capsule with normal appearance of the inner ear structures. Please note, due to patient motion subtle areas of osteolysis would not be detected. No internal auditory canal expansion. No definite cerebellar pontine angle masses. No obstructing nasopharyngeal mass. No dural venous sinus thrombosis a an included field-of-view. IMPRESSION: CT HEAD:  Stable negative CT HEAD for age. CT TEMPORAL BONES:  Moderately motion degraded examination. LEFT middle ear and mastoid effusion compatible with Eustation tube dysfunction without CT findings of mastoiditis or abscess. Electronically Signed   By: Awilda Metro M.D.   On: 04/02/2017 23:03   Ct Temporal Bones W Contrast  Result Date: 04/02/2017 CLINICAL DATA:  Dementia, altered mental status. follow-up left middle ear effusion. EXAM: CT HEAD WITHOUT CONTRAST CT TEMPORAL BONES WITH CONTRAST TECHNIQUE: Contiguous axial images were obtained from the base of the skull through the vertex without contrast. Multidetector CT imaging of the temporal bones was performed using the standard protocol with intravenous contrast. CONTRAST:  75 CC ISOVUE 300 COMPARISON:  CT HEAD March 24, 2017 FINDINGS: CT HEAD FINDINGS BRAIN: No intraparenchymal hemorrhage, mass effect nor midline shift. The ventricles and sulci are normal for age. Patchy supratentorial white matter hypodensities within normal range for patient's age, though non-specific are most compatible with chronic small vessel ischemic disease. No acute large vascular territory infarcts. No abnormal extra-axial fluid collections. Basal cisterns are patent. VASCULAR: Moderate calcific atherosclerosis of the carotid siphons.  SKULL: No skull fracture. No significant scalp soft tissue swelling. SINUSES/ORBITS: The paranasal sinuses are well-aerated.The included ocular globes and orbital contents are non-suspicious. Status post bilateral ocular lens implants. OTHER: None. CT TEMPORAL BONES FINDINGS- moderately motion degraded examination. RIGHT: External auditory canal is well formed and well aerated. Tympanic membrane is not thickened or retracted. The scutum is sharp. Well aerated middle ear including Prussak's space. Ossicles are well formed and located. Patent aditus ad antrum. Well aerated mastoid air cells without coalescence. Intact tegmen tympani. Intact otic capsule with normal appearance of the inner ear structures. Please note, due to patient motion subtle areas of osteolysis would not be detected.  No internal auditory canal expansion. No definite cerebellar pontine angle masses. LEFT: External auditory canal is well formed and aerated. Tympanic membrane may be retracted though limited assessment due to motion. The scutum is sharp. Soft tissue throughout the middle ear including Prussak's space Ossicles are well formed and located. Patent aditus ad antrum. Soft tissue opacifies mastoid air cells without coalescence. Intact tegmen tympani. Intact otic capsule with normal appearance of the inner ear structures. Please note, due to patient motion subtle areas of osteolysis would not be detected. No internal auditory canal expansion. No definite cerebellar pontine angle masses. No obstructing nasopharyngeal mass. No dural venous sinus thrombosis a an included field-of-view. IMPRESSION: CT HEAD:  Stable negative CT HEAD for age. CT TEMPORAL BONES:  Moderately motion degraded examination. LEFT middle ear and mastoid effusion compatible with Eustation tube dysfunction without CT findings of mastoiditis or abscess. Electronically Signed   By: Awilda Metro M.D.   On: 04/02/2017 23:03    EKG: Independently reviewed. Sinus rhythm,  PAC, LAFB  Assessment/Plan  1. Acute encephalopathy  - Pt recently admitted for same and improved with treatment of hyponatremia  - Sodium only slightly low now, there are no focal neurologic deficits, and no acute intracranial findings on head CT  - Possible that AOM is contributing to this and will be treated as below  - After discussion with patient's daughter, a local physician, she asks that no more blood tests be performed unless absolutely necessary, and no further imaging without another discussion with daughter; she explains that the patient does not want any heroic measures or any invasive procedure  - Plan to treat with abx as below   2. Acute otitis media  - Left TM opaque and there is pain with manipulation of auricle  - No abscess evident on CT  - She was given an empiric dose of Zosyn in ED and will be continued on Unasyn for now   3. Paroxysmal atrial fibrillation  - In a sinus rhythm on admission - CHADS-VASc 7 (age x2, TIA x2, gender, CHF, HTN) - Continue Eliquis and metoprolol as tolerated   4. Chronic diastolic CHF  - Pt appears euvolemic, well-compensated on admission  - She has been of torsemide for several wks per family and has done well  - Follow I/O's and daily wts, SLIV   5. Hyponatremia  - Serum sodium 133 on admission, improved from priors  - Per family/patient wishes, no more blood draws for now   6. Hypothyroidism  - Synthroid reduced from 88 to 75 mcg earlier this month  - Continue Synthroid at current dosing, consider repeat TSH in next cpl weeks as appropriate    DVT prophylaxis: Eliquis Code Status: DNR Family Communication: Daughter updated at bedside Disposition Plan: Observe on med-surg Consults called: None Admission status: Observation    Briscoe Deutscher, MD [facility] Pager [phone]  If 7PM-7AM, please contact night-coverage [email or web address] Password [email or web address]  04/03/2017, 1:23 AM

## 2017-04-03 NOTE — Progress Notes (Signed)
Pt up to [facility] to void,unsteady,legs weak,noticed hand tremors when drinking from cup.Pt assisted back to bed.Call bell in reach,and bed alrm on.wbb

## 2017-04-03 NOTE — Progress Notes (Signed)
Palliative care progress note  I met briefly today with [name] and her daughter.  [name] is awake, alert, and eating dinner, but is confused to participate in goals conversation.  I spoke with her daughter, Melanie Obrien, who is a family physician.  Dr. Addison Lank reports plan to continue current therapies to see if her mother improves, and she denies needing support of our team at this time.  She has a good understanding of her mother's condition and areas in which palliative care may be able to assist.  She will call if she has any questions or would like to meet further.  PMT to follow peripherally.  Please let us know if we can be of further assistance in the care of [name] moving forward.  NO CHARGE NOTE  Micheline Rough, MD [facility] [phone]

## 2017-04-03 NOTE — Telephone Encounter (Signed)
LMOVM reminding pt to send remote transmission.   

## 2017-04-03 NOTE — Progress Notes (Signed)
Pharmacy Antibiotic Note  Melanie Obrien is a 83 y.o. female admitted on 04/02/2017 with acute otitis media.  Pharmacy has been consulted for Unasyn dosing.  Plan: Unasyn 3gm iv q12hr  Height:  (139.7 cm) Weight: 98 lb 1.7 oz (44.5 kg) IBW/kg (Calculated) : 34  Temp (24hrs), Avg:98.1 F (36.7 C), Min:97.8 F (36.6 C), Max:98.4 F (36.9 C)   Recent Labs Lab 04/02/17 2027  WBC 8.2  CREATININE 0.65    Estimated Creatinine Clearance: 24.8 mL/min (by C-G formula based on SCr of 0.65 mg/dL).    Allergies  Allergen Reactions  . Codeine Other (See Comments)    unknown  . Codeine Nausea And Vomiting    & headaches  . Levaquin [Levofloxacin In D5w] Itching  . Levaquin [Levofloxacin In D5w] Swelling and Other (See Comments)    & redness  . Loratadine Other (See Comments)    TACHYCARDIA  . Xylocaine [Lidocaine Hcl] Other (See Comments)    Uncontrolled shaking  . Xylocaine [Lidocaine Hcl] Other (See Comments)    Made her very cold & lots of shaking. Pt and daughter stated that she can take it.  . Loratadine Palpitations    Antimicrobials this admission: Unasyn 04/03/2017 >> Zosyn 04/02/2017 x1  Dose adjustments this admission: -  Microbiology results: pending  Thank you for allowing pharmacy to be a part of this patient's care.  Aleene Davidson Crowford 04/03/2017 4:15 AM

## 2017-04-03 NOTE — Progress Notes (Signed)
Nursing Note: Pt arrived via stretcher from the ED.Received report from Abby.Pt was accompanied by her daughter.T-98.4 P-85 R-16 BP-138/54 PO2 97% on r/a.Pt resting quietly in bed.Sleepy,follows simple commands w/ repeat for tasks as pt is very HOH.Pt has glasses at the bedside.No hearing aids noted.Pt has arthritis in hands per daughter and has weak grips.Pt able to wiggle toes but diffulty lifting legs off bed when lying flat.Call bell in reach,bed alrm on and pt educated not to get up w/o staff assist for fall prevention.Heels red,r> L.Heels off the bed w/ a pillow.wbb

## 2017-04-03 NOTE — Progress Notes (Signed)
Patient admitted after midnight. Please refer to admission note completed 04/03/2017 for further details.  83 year old female with past medical history significant for paroxysmal atrial fibrillation on anticoagulation with apixaban, chronic diastolic CHF, arthritis, history of TIA, gluten intolerance who presented to ED from skilled nursing facility for evaluation of worsening altered mental status noted earlier on the day of the admission. Patient was noted to have an opaque left tympanic membrane with pain on manipulation of the auricle . CT scan showed left middle ear and mastoid effusion compatible with Eustation tube dysfunction without CT findings of mastoiditis or abscess.. Due to suspected acute otitis media pt started on empri Unasyn. In addition, patient looks clinically dehydrated for which reason we'll start low rate IV fluids.  Manson Passey [facility] [phone]

## 2017-04-04 DIAGNOSIS — E038 Other specified hypothyroidism: Secondary | ICD-10-CM

## 2017-04-04 DIAGNOSIS — I5032 Chronic diastolic (congestive) heart failure: Secondary | ICD-10-CM

## 2017-04-04 DIAGNOSIS — H6500 Acute serous otitis media, unspecified ear: Secondary | ICD-10-CM

## 2017-04-04 DIAGNOSIS — G9340 Encephalopathy, unspecified: Secondary | ICD-10-CM

## 2017-04-04 LAB — BASIC METABOLIC PANEL
Anion gap: 6 (ref 5–15)
BUN: 12 mg/dL (ref 6–20)
CO2: 23 mmol/L (ref 22–32)
CREATININE: 0.61 mg/dL (ref 0.44–1.00)
Calcium: 8.1 mg/dL — ABNORMAL LOW (ref 8.9–10.3)
Chloride: 105 mmol/L (ref 101–111)
GFR calc non Af Amer: >60 mL/min (ref >60)
Glucose, Bld: 84 mg/dL (ref 65–99)
Potassium: 4.5 mmol/L (ref 3.5–5.1)
Sodium: 134 mmol/L — ABNORMAL LOW (ref 135–145)

## 2017-04-04 LAB — CBC
HCT: 32.7 % — ABNORMAL LOW (ref 36.0–46.0)
Hemoglobin: 11.2 g/dL — ABNORMAL LOW (ref 12.0–15.0)
MCH: 32.4 pg (ref 26.0–34.0)
MCHC: 34.3 g/dL (ref 30.0–36.0)
MCV: 94.5 fL (ref 78.0–100.0)
Platelets: 231 10*3/uL (ref 150–400)
RBC: 3.46 MIL/uL — ABNORMAL LOW (ref 3.87–5.11)
RDW: 13.8 % (ref 11.5–15.5)
WBC: 6.9 10*3/uL (ref 4.0–10.5)

## 2017-04-04 NOTE — NC FL2 (Signed)
[place] MEDICAID FL2 LEVEL OF CARE SCREENING TOOL     IDENTIFICATION  Patient Name: Melanie Obrien Birthdate: 06-03-1919 Sex: female Admission Date (Current Location): 04/02/2017  [facility] and [place] Number:  Producer, television/film/video and Address:  [facility],  [address], [place] [address]      Provider Number: [id number]  Attending Physician Name and Address:  Alison Murray, MD  Relative Name and Phone Number:       Current Level of Care: Hospital Recommended Level of Care: Skilled Nursing Facility Prior Approval Number:    Date Approved/Denied:   PASRR Number:    Discharge Plan: SNF    Current Diagnoses: Patient Active Problem List   Diagnosis Date Noted  . AOM (acute otitis media) 04/03/2017  . Acute otitis media 04/03/2017  . Encephalopathy 03/24/2017  . Hyponatremia 03/24/2017  . Acute encephalopathy 03/24/2017  . Pressure injury of skin 03/24/2017  . Aortic stenosis 10/17/2016  . Chronic kidney disease   . Paranoia (psychosis) (HCC) 02/15/2016  . Acute bronchitis 02/04/2016  . Fatigue 06/17/2015  . Hypothyroidism 05/18/2015  . Unstable gait 04/24/2015  . Degenerative disc disease, lumbar 04/24/2015  . Idiopathic scoliosis 04/24/2015  . Weakness 04/24/2015  . Dyspnea 04/24/2015  . Gluten intolerance 04/24/2015  . HTN (hypertension) 04/20/2015  . Fracture of humeral head, left, closed 04/17/2015  . Fall   . Other seasonal allergic rhinitis   . Malnutrition of moderate degree (HCC) 01/21/2015  . Hepatopathy - congestive 01/20/2015  . Chronic diastolic congestive heart failure (HCC) 01/18/2015  . Lower extremity edema   . Paroxysmal atrial fibrillation (HCC)   . Elevated LFTs 01/16/2015  . Syncope 01/16/2015  . SOB (shortness of breath)   . Atrial fibrillation (HCC) 12/13/2014  . TIA (transient ischemic attack) 12/13/2014  . Trigeminal neuralgia of left side of face 12/13/2014  . Hot flashes 12/13/2014    Orientation RESPIRATION  BLADDER Height & Weight     Self, Place  Normal Continent Weight: 98 lb 11.2 oz (44.8 kg) Height:   (139.7 cm)  BEHAVIORAL SYMPTOMS/MOOD NEUROLOGICAL BOWEL NUTRITION STATUS      Continent Diet (gluten free)  AMBULATORY STATUS COMMUNICATION OF NEEDS Skin   Maximum assist Verbally Other (Comment) (Pressure Injury 03/24/17 Stage I -  Intact skin with non-blanchable redness of a localized area usually over a bony prominence.)                       Personal Care Assistance Level of Assistance  Bathing, Feeding, Dressing Bathing Assistance: Maximum assistance Feeding assistance: Limited assistance Dressing Assistance: Maximum assistance     Functional Limitations Info  Sight, Speech, Hearing Sight Info: Adequate Hearing Info: Impaired (hard of hearing) Speech Info: Adequate    SPECIAL CARE FACTORS FREQUENCY  PT (By licensed PT), OT (By licensed OT)     PT Frequency: 5x OT Frequency: 5x            Contractures Contractures Info: Not present    Additional Factors Info  Code Status, Allergies Code Status Info: DNR Allergies Info: Codeine, Codeine, Levaquin Levofloxacin In D5w, Levaquin Levofloxacin In D5w, Loratadine, Xylocaine Lidocaine Hcl, Xylocaine Lidocaine Hcl, Loratadine           Current Medications (04/04/2017):  This is the current hospital active medication list Current Facility-Administered Medications  Medication Dose Route Frequency Provider Last Rate Last Dose  . 0.9 %  sodium chloride infusion   Intravenous Continuous Alma M  Elisabeth Pigeon, MD 50 mL/hr at 04/04/17 [phone]    . acetaminophen (TYLENOL) tablet 650 mg  650 mg Oral Q6H PRN Lavone Neri Opyd, MD      . Ampicillin-Sulbactam (UNASYN) 3 g in sodium chloride 0.9 % 100 mL IVPB  3 g Intravenous Q12H Briscoe Deutscher, MD   3 g at 04/03/17 2111  . apixaban (ELIQUIS) tablet 2.5 mg  2.5 mg Oral BID Briscoe Deutscher, MD   2.5 mg at 04/03/17 2110  . bisacodyl (DULCOLAX) EC tablet 5 mg  5 mg Oral Daily PRN Briscoe Deutscher, MD      . cholecalciferol (VITAMIN D) tablet 1,000 Units  1,000 Units Oral Daily Briscoe Deutscher, MD   1,000 Units at 04/03/17 1035  . levothyroxine (SYNTHROID, LEVOTHROID) tablet 75 mcg  75 mcg Oral QAC breakfast Briscoe Deutscher, MD   75 mcg at 04/03/17 0733  . lisinopril (PRINIVIL,ZESTRIL) tablet 5 mg  5 mg Oral Daily Briscoe Deutscher, MD   5 mg at 04/03/17 1035  . magnesium oxide (MAG-OX) tablet 400 mg  400 mg Oral Daily Briscoe Deutscher, MD   400 mg at 04/03/17 1035  . metoprolol tartrate (LOPRESSOR) tablet 25 mg  25 mg Oral BID Roma Kayser Schorr, NP   25 mg at 04/03/17 1035  . ondansetron (ZOFRAN) tablet 4 mg  4 mg Oral Q6H PRN Briscoe Deutscher, MD       Or  . ondansetron (ZOFRAN) injection 4 mg  4 mg Intravenous Q6H PRN Lavone Neri Opyd, MD      . polyethylene glycol (MIRALAX / GLYCOLAX) packet 17 g  17 g Oral Daily PRN Briscoe Deutscher, MD         Discharge Medications: Please see discharge summary for a list of discharge medications.  Relevant Imaging Results:  Relevant Lab Results:   Additional Information SS# [id number]  Noah Delaine  [phone]

## 2017-04-04 NOTE — Progress Notes (Addendum)
Patient ID: Melanie Obrien, female   DOB: 1919-10-06, 83 y.o.   MRN: [id number]  PROGRESS NOTE    Melanie Obrien  WUJ:[id number] DOB: 1919/12/17 DOA: 04/02/2017  PCP: Gaye Alken, MD   Brief Narrative:  82 year old female with past medical history significant for paroxysmal atrial fibrillation on anticoagulation with apixaban, chronic diastolic CHF, arthritis, history of TIA, gluten intolerance who presented to ED from skilled nursing facility for evaluation of worsening altered mental status noted earlier on the day of the admission. Patient was noted to have an opaque left tympanic membrane with pain on manipulation of the auricle . CT scan showed left middle ear and mastoid effusion compatible with Eustation tube dysfunction without CT findings of mastoiditis or abscess.. Due to suspected acute otitis media pt started on empri Unasyn.    Assessment & Plan:   Principal Problem:   Acute metabolic encephalopathy - Likely in this setting of acute infection - Mental status stable - Obtain PT evaluation  Active Problems:   Acute otitis media - Continue Unasyn    Chronic diastolic congestive heart failure (HCC) - Compensated    Paroxysmal atrial fibrillation (HCC) - CHADS vasc score 4 - On AC with apixaban - Continue rate controlled with metoprolol    HTN (hypertension), essential - Continue lisinopril and metoprolol    Hypothyroidism - Continue Synthroid 75 g daily    DVT prophylaxis: Patient is on Apixaban Code Status: DNR/DNI  Family Communication: Spoke with patient's daughter yesterday 04/04/2007 and at the bedside Disposition Plan: To skilled nursing facility likely tomorrow morning   Consultants:   PT  Procedures:   None   Antimicrobials:   Unasyn 04/03/2017 -->   Subjective: No overnight events.   Objective: Vitals:   04/03/17 1325 04/03/17 1900 04/03/17 2123 04/04/17 0500  BP: (!) 126/51 (!) 114/45 (!) 110/54 (!) 134/45  Pulse: 64 68  (!) 56 63  Resp: 16   16  Temp: 98.4 F (36.9 C) 98.9 F (37.2 C)  97.8 F (36.6 C)  TempSrc: Oral Oral  Oral  SpO2: 96% 96%  95%  Weight:    44.8 kg (98 lb 11.2 oz)  Height:        Intake/Output Summary (Last 24 hours) at 04/04/17 0918 Last data filed at 04/04/17 0640  Gross per 24 hour  Intake           1152.5 ml  Output                0 ml  Net           1152.5 ml   Filed Weights   04/03/17 0145 04/04/17 0500  Weight: 44.5 kg (98 lb 1.7 oz) 44.8 kg (98 lb 11.2 oz)    Examination:  General exam: Appears calm and comfortable  Respiratory system: Clear to auscultation. Respiratory effort normal. Cardiovascular system: S1 & S2 heard, RRR. No pedal edema. Gastrointestinal system: Abdomen is nondistended, soft and nontender. No organomegaly or masses felt. Normal bowel sounds heard. Central nervous system: No focal neurological deficits. Extremities: Symmetric 5 x 5 power. Skin: No rashes, lesions or ulcers Psychiatry: Judgement and insight appear normal. Mood & affect appropriate.   Data Reviewed: I have personally reviewed following labs and imaging studies  CBC:  Recent Labs Lab 04/02/17 2027 04/04/17 0334  WBC 8.2 6.9  HGB 12.4 11.2*  HCT 35.4* 32.7*  MCV 90.8 94.5  PLT 280 231   Basic Metabolic Panel:  Recent Labs Lab 04/02/17 2027 04/04/17  0334  NA 133* 134*  K 4.6 4.5  CL 102 105  CO2 24 23  GLUCOSE 107* 84  BUN 14 12  CREATININE 0.65 0.61  CALCIUM 8.9 8.1*   GFR: Estimated Creatinine Clearance: 24.9 mL/min (by C-G formula based on SCr of 0.61 mg/dL). Liver Function Tests:  Recent Labs Lab 04/02/17 2027  AST 27  ALT 20  ALKPHOS 68  BILITOT 0.7  PROT 6.2*  ALBUMIN 3.8   No results for input(s): LIPASE, AMYLASE in the last 168 hours. No results for input(s): AMMONIA in the last 168 hours. Coagulation Profile: No results for input(s): INR, PROTIME in the last 168 hours. Cardiac Enzymes: No results for input(s): CKTOTAL, CKMB,  CKMBINDEX, TROPONINI in the last 168 hours. BNP (last 3 results) No results for input(s): PROBNP in the last 8760 hours. HbA1C: No results for input(s): HGBA1C in the last 72 hours. CBG:  Recent Labs Lab 04/03/17 0642  GLUCAP 85   Lipid Profile: No results for input(s): CHOL, HDL, LDLCALC, TRIG, CHOLHDL, LDLDIRECT in the last 72 hours. Thyroid Function Tests: No results for input(s): TSH, T4TOTAL, FREET4, T3FREE, THYROIDAB in the last 72 hours. Anemia Panel: No results for input(s): VITAMINB12, FOLATE, FERRITIN, TIBC, IRON, RETICCTPCT in the last 72 hours. Urine analysis:    Component Value Date/Time   COLORURINE YELLOW 04/02/2017 2133   APPEARANCEUR CLEAR 04/02/2017 2133   LABSPEC 1.018 04/02/2017 2133   PHURINE 8.0 04/02/2017 2133   GLUCOSEU NEGATIVE 04/02/2017 2133   HGBUR NEGATIVE 04/02/2017 2133   BILIRUBINUR NEGATIVE 04/02/2017 2133   KETONESUR NEGATIVE 04/02/2017 2133   PROTEINUR NEGATIVE 04/02/2017 2133   UROBILINOGEN 0.2 01/09/2016 1545   NITRITE NEGATIVE 04/02/2017 2133   LEUKOCYTESUR NEGATIVE 04/02/2017 2133   Sepsis Labs: (procalcitonin:4,lacticidven:4)   ) Recent Results (from the past 240 hour(s))  MRSA PCR Screening     Status: None   Collection Time: 04/03/17  7:20 AM  Result Value Ref Range Status   MRSA by PCR NEGATIVE NEGATIVE Final    Comment:        The GeneXpert MRSA Assay (FDA approved for NASAL specimens only), is one component of a comprehensive MRSA colonization surveillance program. It is not intended to diagnose MRSA infection nor to guide or monitor treatment for MRSA infections.       Radiology Studies: Ct Head Wo Contrast Result Date: 04/02/2017 CT HEAD:  Stable negative CT HEAD for age. CT TEMPORAL BONES:  Moderately motion degraded examination. LEFT middle ear and mastoid effusion compatible with Eustation tube dysfunction without CT findings of mastoiditis or abscess. Electronically Signed   By: Awilda Metro  M.D.   On: 04/02/2017 23:03   Ct Temporal Bones W Contrast Result Date: 04/02/2017 CT HEAD:  Stable negative CT HEAD for age. CT TEMPORAL BONES:  Moderately motion degraded examination. LEFT middle ear and mastoid effusion compatible with Eustation tube dysfunction without CT findings of mastoiditis or abscess. Electronically Signed   By: Awilda Metro M.D.   On: 04/02/2017 23:03      Scheduled Meds: . ampicillin-sulbactam (UNASYN) IV  3 g Intravenous Q12H  . apixaban  2.5 mg Oral BID  . cholecalciferol  1,000 Units Oral Daily  . levothyroxine  75 mcg Oral QAC breakfast  . lisinopril  5 mg Oral Daily  . magnesium oxide  400 mg Oral Daily  . metoprolol tartrate  25 mg Oral BID   Continuous Infusions: . sodium chloride 50 mL/hr at 04/03/17 1527     LOS:  1 day    Time spent: 25 minutes  Greater than 50% of the time spent on counseling and coordinating the care.   Manson Passey, MD [facility] Pager [phone]  If 7PM-7AM, please contact night-coverage [email or web address] Password [facility] 04/04/2017, 9:18 AM

## 2017-04-04 NOTE — NC FL2 (Signed)
[place] MEDICAID FL2 LEVEL OF CARE SCREENING TOOL     IDENTIFICATION  Patient Name: Melanie Obrien Birthdate: 06-27-1919 Sex: female Admission Date (Current Location): 04/02/2017  [facility] and [place] Number:  Producer, television/film/video and Address:  [facility],  [address] [place] [address]      Provider Number: [id number]  Attending Physician Name and Address:  Alison Murray, MD  Relative Name and Phone Number:       Current Level of Care: Hospital Recommended Level of Care: Skilled Nursing Facility Prior Approval Number:   [id number]  Date Approved/Denied:   PASRR Number:    Discharge Plan: SNF    Current Diagnoses: Patient Active Problem List   Diagnosis Date Noted  . AOM (acute otitis media) 04/03/2017  . Acute otitis media 04/03/2017  . Encephalopathy 03/24/2017  . Hyponatremia 03/24/2017  . Acute encephalopathy 03/24/2017  . Pressure injury of skin 03/24/2017  . Aortic stenosis 10/17/2016  . Chronic kidney disease   . Paranoia (psychosis) (HCC) 02/15/2016  . Acute bronchitis 02/04/2016  . Fatigue 06/17/2015  . Hypothyroidism 05/18/2015  . Unstable gait 04/24/2015  . Degenerative disc disease, lumbar 04/24/2015  . Idiopathic scoliosis 04/24/2015  . Weakness 04/24/2015  . Dyspnea 04/24/2015  . Gluten intolerance 04/24/2015  . HTN (hypertension) 04/20/2015  . Fracture of humeral head, left, closed 04/17/2015  . Fall   . Other seasonal allergic rhinitis   . Malnutrition of moderate degree (HCC) 01/21/2015  . Hepatopathy - congestive 01/20/2015  . Chronic diastolic congestive heart failure (HCC) 01/18/2015  . Lower extremity edema   . Paroxysmal atrial fibrillation (HCC)   . Elevated LFTs 01/16/2015  . Syncope 01/16/2015  . SOB (shortness of breath)   . Atrial fibrillation (HCC) 12/13/2014  . TIA (transient ischemic attack) 12/13/2014  . Trigeminal neuralgia of left side of face 12/13/2014  . Hot flashes 12/13/2014    Orientation  RESPIRATION BLADDER Height & Weight     Self, Place  Normal Continent Weight: 98 lb 11.2 oz (44.8 kg) Height:   (139.7 cm)  BEHAVIORAL SYMPTOMS/MOOD NEUROLOGICAL BOWEL NUTRITION STATUS      Continent Diet (gluten free)  AMBULATORY STATUS COMMUNICATION OF NEEDS Skin   Limited Assist Verbally Other (Comment) (Pressure Injury 03/24/17 Stage I -  Intact skin with non-blanchable redness of a localized area usually over a bony prominence.)                       Personal Care Assistance Level of Assistance  Bathing, Feeding, Dressing Bathing Assistance: Maximum assistance Feeding assistance: Limited assistance Dressing Assistance: Maximum assistance     Functional Limitations Info  Sight, Speech, Hearing Sight Info: Adequate Hearing Info: Impaired (hard of hearing) Speech Info: Adequate    SPECIAL CARE FACTORS FREQUENCY  PT (By licensed PT), OT (By licensed OT)     PT Frequency: 5x OT Frequency: 5x            Contractures Contractures Info: Not present    Additional Factors Info  Code Status, Allergies Code Status Info: DNR Allergies Info: Codeine, Codeine, Levaquin Levofloxacin In D5w, Levaquin Levofloxacin In D5w, Loratadine, Xylocaine Lidocaine Hcl, Xylocaine Lidocaine Hcl, Loratadine           Current Medications (04/04/2017):  This is the current hospital active medication list Current Facility-Administered Medications  Medication Dose Route Frequency Provider Last Rate Last Dose  . 0.9 %  sodium chloride infusion   Intravenous Continuous Alma  Concepcion Elk, MD 50 mL/hr at 04/04/17 [phone]    . acetaminophen (TYLENOL) tablet 650 mg  650 mg Oral Q6H PRN Lavone Neri Opyd, MD      . Ampicillin-Sulbactam (UNASYN) 3 g in sodium chloride 0.9 % 100 mL IVPB  3 g Intravenous Q12H Briscoe Deutscher, MD   3 g at 04/03/17 2111  . apixaban (ELIQUIS) tablet 2.5 mg  2.5 mg Oral BID Briscoe Deutscher, MD   2.5 mg at 04/03/17 2110  . bisacodyl (DULCOLAX) EC tablet 5 mg  5 mg Oral Daily PRN  Briscoe Deutscher, MD      . cholecalciferol (VITAMIN D) tablet 1,000 Units  1,000 Units Oral Daily Briscoe Deutscher, MD   1,000 Units at 04/03/17 1035  . levothyroxine (SYNTHROID, LEVOTHROID) tablet 75 mcg  75 mcg Oral QAC breakfast Briscoe Deutscher, MD   75 mcg at 04/03/17 0733  . lisinopril (PRINIVIL,ZESTRIL) tablet 5 mg  5 mg Oral Daily Briscoe Deutscher, MD   5 mg at 04/03/17 1035  . magnesium oxide (MAG-OX) tablet 400 mg  400 mg Oral Daily Briscoe Deutscher, MD   400 mg at 04/03/17 1035  . metoprolol tartrate (LOPRESSOR) tablet 25 mg  25 mg Oral BID Roma Kayser Schorr, NP   25 mg at 04/03/17 1035  . ondansetron (ZOFRAN) tablet 4 mg  4 mg Oral Q6H PRN Briscoe Deutscher, MD       Or  . ondansetron (ZOFRAN) injection 4 mg  4 mg Intravenous Q6H PRN Lavone Neri Opyd, MD      . polyethylene glycol (MIRALAX / GLYCOLAX) packet 17 g  17 g Oral Daily PRN Briscoe Deutscher, MD         Discharge Medications: Please see discharge summary for a list of discharge medications.  Relevant Imaging Results:  Relevant Lab Results:   Additional Information SS# [id number]  Nelwyn Salisbury, LCSW

## 2017-04-04 NOTE — Clinical Social Work Note (Signed)
Clinical Social Work Assessment  Patient Details  Name: Melanie Obrien MRN: [id number] Date of Birth: [birth year]  Date of referral:  04/04/17               Reason for consult:  Facility Placement, Discharge Planning                Permission sought to share information with:  Facility Sport and exercise psychologist, Family Supports Permission granted to share information::     Name::        Agency::  [facility]  Relationship::  Daughter Melanie Obrien  Contact Information:  [phone]- daughter  Housing/Transportation Living arrangements for the past 2 months:  [place] of Information:  Patient, Adult Children, Facility Patient Interpreter Needed:  None Criminal Activity/Legal Involvement Pertinent to Current Situation/Hospitalization:  No - Comment as needed Significant Relationships:  Adult Children Lives with:  Facility Resident Do you feel safe going back to the place where you live?  Yes Need for family participation in patient care:  Yes (Comment) (daughter primary decision maker at this time)  Care giving concerns:  Pt from [facility]F.  Per record, at baseline predominantly independent with ADLs/ambulation. Family/pt concerns ALF will not be sufficient to meet pt's needs at d/c. PT consult pending.    Social Worker assessment / plan:  LCSW consulted due to pt from ALF, potentially needing SNF.  Met with pt at bedside. Pt hopeful to return to [facility], recommends speaking with daughter re: plans. CSW spoke with daughter via phone (above). Advised she is in contact with [facility], was informed there are no available SNF beds presently, and that [facility] bed is option.   CSW spoke with Joellen [place] at [facility] who advises she is in communication with [facility] and aware of pt's potential need for SNF bed at d/c. Advises no Medicare beds open till next week but private pay beds available.  Reports she will be contacting pt's daughter about options and will report back to CSW.   Completed FL2.  Plan: following PT eval will continue assisting with placement needs. Anticipate SNF at [facility].  Employment status:  Retired Nurse, adult PT Recommendations:  Not assessed at this time (pending) Information / Referral to community resources:  [place]  Patient/Family's Response to care:  Patient and family member accepting of plan.   Patient/Family's Understanding of and Emotional Response to Diagnosis, Current Treatment, and Prognosis:  Patient pleasant but minimally oriented to situation. Does acknowledge her desire to return to [facility] and feeling positive about that plan. Daughter exhibits more than adequate understanding of plan and is appropriate in response.  Emotional Assessment Appearance:  Appears stated age Attitude/Demeanor/Rapport:    Affect (typically observed):  Accepting, Calm Orientation:  Oriented to Self, Oriented to Place Alcohol / Substance use:  Not Applicable Psych involvement (Current and /or in the community):  No (Comment)  Discharge Needs  Concerns to be addressed:  No discharge needs identified Readmission within the last 30 days:  Yes Current discharge risk:  None Barriers to Discharge:  Ship broker, Continued Medical Work up   [employer], LCSW 04/04/2017, 10:14 AM  [phone]

## 2017-04-05 DIAGNOSIS — E875 Hyperkalemia: Secondary | ICD-10-CM

## 2017-04-05 LAB — CBC
HCT: 35.2 % — ABNORMAL LOW (ref 36.0–46.0)
HEMOGLOBIN: 12.2 g/dL (ref 12.0–15.0)
MCH: 32.7 pg (ref 26.0–34.0)
MCHC: 34.7 g/dL (ref 30.0–36.0)
MCV: 94.4 fL (ref 78.0–100.0)
Platelets: 220 10*3/uL (ref 150–400)
RBC: 3.73 MIL/uL — ABNORMAL LOW (ref 3.87–5.11)
RDW: 13.5 % (ref 11.5–15.5)
WBC: 9.5 10*3/uL (ref 4.0–10.5)

## 2017-04-05 LAB — BASIC METABOLIC PANEL
Anion gap: 6 (ref 5–15)
BUN: 12 mg/dL (ref 6–20)
CALCIUM: 8.3 mg/dL — AB (ref 8.9–10.3)
CHLORIDE: 107 mmol/L (ref 101–111)
CO2: 23 mmol/L (ref 22–32)
CREATININE: 0.62 mg/dL (ref 0.44–1.00)
Glucose, Bld: 67 mg/dL (ref 65–99)
Potassium: 5.3 mmol/L — ABNORMAL HIGH (ref 3.5–5.1)
SODIUM: 136 mmol/L (ref 135–145)

## 2017-04-05 LAB — POTASSIUM: POTASSIUM: 4 mmol/L (ref 3.5–5.1)

## 2017-04-05 MED ORDER — SODIUM POLYSTYRENE SULFONATE 15 GM/60ML PO SUSP
15.0000 g | Freq: Once | ORAL | Status: AC
  Administered 2017-04-05: 15 g via ORAL
  Filled 2017-04-05: qty 60

## 2017-04-05 NOTE — Consult Note (Signed)
   [facility] CM Inpatient Consult   04/05/2017  SKYLINN VIALPANDO [phone]     Patient screened for potential [facility] Care Management services. Chart reviewed. Noted current discharge plan is for  SNF.  There are no identifiable [facility] Care Management needs at this time. If patient's post hospital needs change, please place a [facility] Care Management consult. For questions please contact:  Raiford Noble, MSN-Ed, RN,BSN [facility] Liaison [phone]

## 2017-04-05 NOTE — Discharge Summary (Signed)
Physician Discharge Summary  Melanie Obrien ZOX:[id number] DOB: 08-02-1919 DOA: 04/02/2017  PCP: Gaye Alken, MD  Admit date: 04/02/2017 Discharge date: 04/05/2017  Recommendations for Outpatient Follow-up:  Continue palliative care services and hospice in SNF.  Discharge Diagnoses:  Principal Problem:   Acute encephalopathy Active Problems:   Chronic diastolic congestive heart failure (HCC)   Paroxysmal atrial fibrillation (HCC)   HTN (hypertension)   Hypothyroidism   Hyponatremia   AOM (acute otitis media)   Acute otitis media    Discharge Condition: stable   Diet recommendation: as tolerated   History of present illness:  83 year old female with past medical history significant for paroxysmal atrial fibrillation on anticoagulation with apixaban, chronic diastolic CHF, arthritis, history of TIA, gluten intolerance who presented to ED from skilled nursing facility for evaluation of worsening altered mental status noted earlier on the day of the admission. Patient was noted to have an opaque left tympanic membrane with pain on manipulation of the auricle . CT scan showed left middle ear and mastoid effusion compatible with Eustation tube dysfunction without CT findings of mastoiditis or abscess.. Due to suspected acute otitis media pt started on empri Unasyn.    Hospital Course:   Principal Problem:   Acute metabolic encephalopathy - Likely in this setting of acute infection - Mental status stable - Per PT - SNF  Active Problems:   Acute otitis media - Stop Unasyn, per ENT no need for antibiotics on discharge, no fever and normal WBC     Chronic diastolic congestive heart failure (HCC) - Compensated    Paroxysmal atrial fibrillation (HCC) - CHADS vasc score 4 - On AC with apixaban - Continue rate controlled with metoprolol    HTN (hypertension), essential - Continue lisinopril and metoprolol    Hypothyroidism - Continue Synthroid 75 g  daily    Hyperkalemia - Kayexalate to be given prior to discharge an d will repeat potassium prior to discharge      DVT prophylaxis: Patient is on Apixaban Code Status: DNR/DNI  Family Communication: Spoke with patient's daughter yesterday 04/04/2007 and at the bedside    Consultants:   PT  ENT - Dr. Pollyann Kennedy - phone call only   Procedures:   None   Antimicrobials:   Unasyn 04/03/2017 --> 04/05/2017  Signed:  Manson Passey, MD  [facility] 04/05/2017, 10:14 AM  Pager #: [phone]  Time spent in minutes: less than 30 minutes    Discharge Exam: Vitals:   04/04/17 2110 04/05/17 0533  BP: (!) 156/56 108/90  Pulse: 63 86  Resp: 16 16  Temp: 97.9 F (36.6 C) 98 F (36.7 C)   Vitals:   04/04/17 1100 04/04/17 1400 04/04/17 2110 04/05/17 0533  BP:  (!) 144/46 (!) 156/56 108/90  Pulse: 68 60 63 86  Resp:  Temp:  97.9 F (36.6 C) 97.9 F (36.6 C) 98 F (36.7 C)  TempSrc:  Oral Oral Oral  SpO2:  97% 98% 98%  Weight:    48 kg (105 lb 13.1 oz)  Height:        General: Pt is alert, not in acute distress Cardiovascular: Regular rate and rhythm, S1/S2 + Respiratory: Clear to auscultation bilaterally, no wheezing, no crackles, no rhonchi Abdominal: Soft, non tender, non distended, bowel sounds +, no guarding Extremities: no cyanosis, pulses palpable bilaterally DP and PT Neuro: Grossly nonfocal  Discharge Instructions  Discharge Instructions    Call MD for:  persistant nausea and vomiting  Complete by:  As directed    Call MD for:  redness, tenderness, or signs of infection (pain, swelling, redness, odor or green/yellow discharge around incision site)    Complete by:  As directed    Call MD for:  severe uncontrolled pain    Complete by:  As directed    Diet - low sodium heart healthy    Complete by:  As directed    Discharge instructions    Complete by:  As directed    Continue palliative care services and hospice in SNF.    Increase activity slowly    Complete by:  As directed      Allergies as of 04/05/2017      Reactions   Codeine Other (See Comments)   unknown   Codeine Nausea And Vomiting   & headaches   Levaquin [levofloxacin In D5w] Itching   Levaquin [levofloxacin In D5w] Swelling, Other (See Comments)   & redness   Loratadine Other (See Comments)   TACHYCARDIA   Xylocaine [lidocaine Hcl] Other (See Comments)   Uncontrolled shaking   Xylocaine [lidocaine Hcl] Other (See Comments)   Made her very cold & lots of shaking. Pt and daughter stated that she can take it.   Loratadine Palpitations      Medication List    TAKE these medications   acetaminophen 325 MG tablet Commonly known as:  TYLENOL Take 650 mg by mouth every 6 (six) hours as needed (for pain/fever).   apixaban 2.5 MG Tabs tablet Commonly known as:  ELIQUIS Take 1 tablet (2.5 mg total) by mouth 2 (two) times daily.   CALCIUM PO Take 1,000 mg by mouth daily.   cholecalciferol 1000 units tablet Commonly known as:  VITAMIN D Take 1,000 Units by mouth daily.   CHROMIUM PICOLATE PO Take 1 tablet by mouth daily.   levothyroxine 75 MCG tablet Commonly known as:  SYNTHROID, LEVOTHROID Take 1 tablet (75 mcg total) by mouth daily before breakfast. Reported on 02/04/2016   lisinopril 5 MG tablet Commonly known as:  PRINIVIL,ZESTRIL Take 1 tablet (5 mg total) by mouth daily.   Magnesium 250 MG Tabs Take 250 mg by mouth daily.   metoprolol tartrate 25 MG tablet Commonly known as:  LOPRESSOR Take 1 tablet (25 mg total) by mouth 2 (two) times daily.   PANTOTHENIC ACID PO Take 100 mg by mouth daily.   SUPER B-50 COMPLEX PO Take 1 tablet by mouth daily.      Follow-up Information    Gaye Alken, MD. Schedule an appointment as soon as possible for a visit in 2 week(s).   Specialty:  Family Medicine Contact information: [address] [place] [address] [phone]            The results of  significant diagnostics from this hospitalization (including imaging, microbiology, ancillary and laboratory) are listed below for reference.    Significant Diagnostic Studies: Dg Chest 2 View  Result Date: 03/24/2017 CLINICAL DATA:  Pt states she has known fluid in lungs, and is denying any chest pain. Xray obtained for chest pain, but pt is confused. EXAM: CHEST  2 VIEW COMPARISON:  11/24/2015 FINDINGS: The cardiac silhouette is mildly enlarged. No mediastinal or hilar masses or convincing adenopathy. Left anterior chest wall sequential pacemaker is stable and well positioned. Clear lungs.  No pleural effusion.  No pneumothorax. Skeletal structures are demineralized. There is an old healed proximal left humerus fracture. IMPRESSION: No acute cardiopulmonary disease. Electronically Signed   By:  Amie Portland M.D.   On: 03/24/2017 13:15   Ct Head Wo Contrast  Result Date: 04/02/2017 CLINICAL DATA:  Dementia, altered mental status. follow-up left middle ear effusion. EXAM: CT HEAD WITHOUT CONTRAST CT TEMPORAL BONES WITH CONTRAST TECHNIQUE: Contiguous axial images were obtained from the base of the skull through the vertex without contrast. Multidetector CT imaging of the temporal bones was performed using the standard protocol with intravenous contrast. CONTRAST:  75 CC ISOVUE 300 COMPARISON:  CT HEAD March 24, 2017 FINDINGS: CT HEAD FINDINGS BRAIN: No intraparenchymal hemorrhage, mass effect nor midline shift. The ventricles and sulci are normal for age. Patchy supratentorial white matter hypodensities within normal range for patient's age, though non-specific are most compatible with chronic small vessel ischemic disease. No acute large vascular territory infarcts. No abnormal extra-axial fluid collections. Basal cisterns are patent. VASCULAR: Moderate calcific atherosclerosis of the carotid siphons. SKULL: No skull fracture. No significant scalp soft tissue swelling. SINUSES/ORBITS: The paranasal sinuses  are well-aerated.The included ocular globes and orbital contents are non-suspicious. Status post bilateral ocular lens implants. OTHER: None. CT TEMPORAL BONES FINDINGS- moderately motion degraded examination. RIGHT: External auditory canal is well formed and well aerated. Tympanic membrane is not thickened or retracted. The scutum is sharp. Well aerated middle ear including Prussak's space. Ossicles are well formed and located. Patent aditus ad antrum. Well aerated mastoid air cells without coalescence. Intact tegmen tympani. Intact otic capsule with normal appearance of the inner ear structures. Please note, due to patient motion subtle areas of osteolysis would not be detected. No internal auditory canal expansion. No definite cerebellar pontine angle masses. LEFT: External auditory canal is well formed and aerated. Tympanic membrane may be retracted though limited assessment due to motion. The scutum is sharp. Soft tissue throughout the middle ear including Prussak's space Ossicles are well formed and located. Patent aditus ad antrum. Soft tissue opacifies mastoid air cells without coalescence. Intact tegmen tympani. Intact otic capsule with normal appearance of the inner ear structures. Please note, due to patient motion subtle areas of osteolysis would not be detected. No internal auditory canal expansion. No definite cerebellar pontine angle masses. No obstructing nasopharyngeal mass. No dural venous sinus thrombosis a an included field-of-view. IMPRESSION: CT HEAD:  Stable negative CT HEAD for age. CT TEMPORAL BONES:  Moderately motion degraded examination. LEFT middle ear and mastoid effusion compatible with Eustation tube dysfunction without CT findings of mastoiditis or abscess. Electronically Signed   By: Awilda Metro M.D.   On: 04/02/2017 23:03   Ct Head Wo Contrast  Result Date: 03/24/2017 CLINICAL DATA:  Larey Seat several weeks ago, confusion, evaluate encephalopathy, history paroxysmal atrial  fibrillation, stroke, CHF, hypertension, diastolic heart failure EXAM: CT HEAD WITHOUT CONTRAST TECHNIQUE: Contiguous axial images were obtained from the base of the skull through the vertex without intravenous contrast. Sagittal and coronal MPR images reconstructed from axial data set. COMPARISON:  04/17/2015 FINDINGS: Brain: Generalized atrophy. Normal ventricular morphology. No midline shift or mass effect. Small vessel chronic ischemic changes of deep cerebral white matter. No intracranial hemorrhage, mass lesion, evidence of acute infarction, or extra-axial fluid collection. Vascular: Atherosclerotic calcifications at the carotid siphons Skull: Demineralized but intact Sinuses/Orbits: Increased opacification of LEFT mastoid air cells and LEFT middle ear cavity Other: N/A IMPRESSION: Atrophy with small vessel chronic ischemic changes of deep cerebral white matter. No acute intracranial abnormalities. Increased LEFT mastoid effusion and LEFT middle ear opacification. Electronically Signed   By: Ulyses Southward M.D.   On: 03/24/2017 15:29  Ct Temporal Bones W Contrast  Result Date: 04/02/2017 CLINICAL DATA:  Dementia, altered mental status. follow-up left middle ear effusion. EXAM: CT HEAD WITHOUT CONTRAST CT TEMPORAL BONES WITH CONTRAST TECHNIQUE: Contiguous axial images were obtained from the base of the skull through the vertex without contrast. Multidetector CT imaging of the temporal bones was performed using the standard protocol with intravenous contrast. CONTRAST:  75 CC ISOVUE 300 COMPARISON:  CT HEAD March 24, 2017 FINDINGS: CT HEAD FINDINGS BRAIN: No intraparenchymal hemorrhage, mass effect nor midline shift. The ventricles and sulci are normal for age. Patchy supratentorial white matter hypodensities within normal range for patient's age, though non-specific are most compatible with chronic small vessel ischemic disease. No acute large vascular territory infarcts. No abnormal extra-axial fluid  collections. Basal cisterns are patent. VASCULAR: Moderate calcific atherosclerosis of the carotid siphons. SKULL: No skull fracture. No significant scalp soft tissue swelling. SINUSES/ORBITS: The paranasal sinuses are well-aerated.The included ocular globes and orbital contents are non-suspicious. Status post bilateral ocular lens implants. OTHER: None. CT TEMPORAL BONES FINDINGS- moderately motion degraded examination. RIGHT: External auditory canal is well formed and well aerated. Tympanic membrane is not thickened or retracted. The scutum is sharp. Well aerated middle ear including Prussak's space. Ossicles are well formed and located. Patent aditus ad antrum. Well aerated mastoid air cells without coalescence. Intact tegmen tympani. Intact otic capsule with normal appearance of the inner ear structures. Please note, due to patient motion subtle areas of osteolysis would not be detected. No internal auditory canal expansion. No definite cerebellar pontine angle masses. LEFT: External auditory canal is well formed and aerated. Tympanic membrane may be retracted though limited assessment due to motion. The scutum is sharp. Soft tissue throughout the middle ear including Prussak's space Ossicles are well formed and located. Patent aditus ad antrum. Soft tissue opacifies mastoid air cells without coalescence. Intact tegmen tympani. Intact otic capsule with normal appearance of the inner ear structures. Please note, due to patient motion subtle areas of osteolysis would not be detected. No internal auditory canal expansion. No definite cerebellar pontine angle masses. No obstructing nasopharyngeal mass. No dural venous sinus thrombosis a an included field-of-view. IMPRESSION: CT HEAD:  Stable negative CT HEAD for age. CT TEMPORAL BONES:  Moderately motion degraded examination. LEFT middle ear and mastoid effusion compatible with Eustation tube dysfunction without CT findings of mastoiditis or abscess. Electronically  Signed   By: Awilda Metro M.D.   On: 04/02/2017 23:03    Microbiology: Recent Results (from the past 240 hour(s))  Blood culture (routine x 2)     Status: None (Preliminary result)   Collection Time: 04/02/17 11:51 PM  Result Value Ref Range Status   Specimen Description BLOOD LEFT ANTECUBITAL  Final   Special Requests   Final    BOTTLES DRAWN AEROBIC AND ANAEROBIC Blood Culture results may not be optimal due to an inadequate volume of blood received in culture bottles   Culture   Final    NO GROWTH 1 DAY Performed at [facility], [address], [place], [place] [address]    Report Status PENDING  Incomplete  Blood culture (routine x 2)     Status: None (Preliminary result)   Collection Time: 04/02/17 11:51 PM  Result Value Ref Range Status   Specimen Description BLOOD RIGHT ANTECUBITAL  Final   Special Requests   Final    BOTTLES DRAWN AEROBIC AND ANAEROBIC Blood Culture adequate volume   Culture   Final  NO GROWTH 1 DAY Performed at [facility], [address], [place], [place] [address]    Report Status PENDING  Incomplete  MRSA PCR Screening     Status: None   Collection Time: 04/03/17  7:20 AM  Result Value Ref Range Status   MRSA by PCR NEGATIVE NEGATIVE Final    Comment:        The GeneXpert MRSA Assay (FDA approved for NASAL specimens only), is one component of a comprehensive MRSA colonization surveillance program. It is not intended to diagnose MRSA infection nor to guide or monitor treatment for MRSA infections.      Labs: Basic Metabolic Panel:  Recent Labs Lab 04/02/17 2027 04/04/17 0334 04/05/17 0415  NA 133* 134* 136  K 4.6 4.5 5.3*  CL 102 105 107  CO2 GLUCOSE 107* 84 67  BUN CREATININE 0.65 0.61 0.62  CALCIUM 8.9 8.1* 8.3*   Liver Function Tests:  Recent Labs Lab 04/02/17 2027  AST 27  ALT 20  ALKPHOS 68  BILITOT 0.7  PROT 6.2*  ALBUMIN 3.8   No results for input(s): LIPASE, AMYLASE in  the last 168 hours. No results for input(s): AMMONIA in the last 168 hours. CBC:  Recent Labs Lab 04/02/17 2027 04/04/17 0334 04/05/17 0415  WBC 8.2 6.9 9.5  HGB 12.4 11.2* 12.2  HCT 35.4* 32.7* 35.2*  MCV 90.8 94.5 94.4  PLT 280 231 220   Cardiac Enzymes: No results for input(s): CKTOTAL, CKMB, CKMBINDEX, TROPONINI in the last 168 hours. BNP: BNP (last 3 results) No results for input(s): BNP in the last 8760 hours.  ProBNP (last 3 results) No results for input(s): PROBNP in the last 8760 hours.  CBG:  Recent Labs Lab 04/03/17 0642  GLUCAP 85

## 2017-04-05 NOTE — Progress Notes (Signed)
Discharge packet ready for transport, patient is confused and unable to understand. Patient to be transported via EMS tom SNF.

## 2017-04-05 NOTE — Progress Notes (Signed)
LCSW following for discharge planning/disposition:  Pt new SNF placement at [facility] (was residing in [facility])  Patient will transport by [facility]. CSW called for transport. Family notified regarding discharge 04/05/17 - Daughter [phone] All information sent to facility by HUB Report number for RN:  [phone]  No other needs at this time   Plan: DC to [facility] SN[facility].   Ilean Skill, MSW, LCSW Clinical Social Work 04/05/2017 [phone]

## 2017-04-05 NOTE — Clinical Social Work Placement (Signed)
   CLINICAL SOCIAL WORK PLACEMENT  NOTE  Date:  04/05/2017  Patient Details  Name: Melanie Obrien MRN: [id number] Date of Birth: [birth year]  Clinical Social Work is seeking post-discharge placement for this patient at the Skilled  Nursing Facility level of care (*CSW will initial, date and re-position this form in  chart as items are completed):  Yes   Patient/family provided with [facility] Clinical Social Work Department's list of facilities offering this level of care within the geographic area requested by the patient (or if unable, by the patient's family).  Yes   Patient/family informed of their freedom to choose among providers that offer the needed level of care, that participate in Medicare, Medicaid or managed care program needed by the patient, have an available bed and are willing to accept the patient.  Yes   Patient/family informed of [facility]'s ownership interest in [facility] and [facility], as well as of the fact that they are under no obligation to receive care at these facilities.  PASRR submitted to EDS on 04/04/17     PASRR number received on 04/04/17     Existing PASRR number confirmed on       FL2 transmitted to all facilities in geographic area requested by pt/family on 04/05/17     FL2 transmitted to all facilities within larger geographic area on       Patient informed that his/her managed care company has contracts with or will negotiate with certain facilities, including the following:        Yes   Patient/family informed of bed offers received.  Patient chooses bed at [facility]     Physician recommends and patient chooses bed at [facility]    Patient to be transferred to [facility] on 04/05/17.  Patient to be transferred to facility by [facility]     Patient family notified on 04/05/17 of transfer.  Name of family member notified:  [name]     PHYSICIAN Please sign FL2, Please sign DNR     Additional  Comment:    _______________________________________________ Nelwyn Salisbury, LCSW 04/05/2017, 11:57 AM

## 2017-04-05 NOTE — Discharge Instructions (Signed)
Delirium Delirium is a state of mental confusion. It comes on quickly and causes significant changes in a person's thinking and behavior. People with delirium usually have trouble paying attention to what is going on or knowing where they are. They may become very withdrawn or very emotional and unable to sit still. They may even see or feel things that are not there (hallucinations). Delirium is a sign of a serious underlying medical condition. What are the causes? Delirium occurs when something suddenly affects the signals that the brain sends out. Brain signals can be affected by anything that puts severe stress on the body and brain and causes brain chemicals to be out of balance. The most common causes of delirium include:  Infections. These may be bacterial, viral, fungal, or protozoal.  Medicines. These include many over-the-counter and prescription medicines.  Recreational drugs.  Substance withdrawal. This occurs with sudden discontinuation of alcohol, certain medicines, or recreational drugs.  Surgery.  Sudden vascular events, such as stroke, brain hemorrhage, and severe migraine.  Other brain disorders, such as tumors, seizures, and physical head trauma.  Metabolic disorders, such as kidney or liver failure.  Low blood oxygen (anoxia). This may occur with lung disease, cardiac arrest, or carbon monoxide poisoning.  Hormone imbalances (endocrinopathies), such as an overactive thyroid (hyperthyroidism) or underactive thyroid (hypothyroidism).  Vitamin deficiencies. What increases the risk? This condition is more likely to develop in:  Children.  Older people.  People who live alone.  People who have vision loss or hearing loss.  People who have existing brain disease, such as dementia.  People who have long-lasting (chronic) medical conditions, such as heart disease.  People who are hospitalized for long periods of time. What are the signs or symptoms? Delirium  starts with a sudden change in a person's thinking or behavior. Symptoms come and go (fluctuate) over time, and they are often worse at the end of the day. Symptoms include:  Not being able to stay awake (drowsiness) or pay attention.  Being confused about places, time, and people.  Forgetfulness.  Having extreme energy levels. These may be low or high.  Changes in sleep patterns.  Extreme mood swings, such as anger or anxiety.  Focusing on things or ideas that are not important.  Rambling and senseless talking.  Difficulty speaking, understanding speech, or both.  Hallucinations.  Tremor or unsteady gait. How is this diagnosed? People with delirium may not realize that they have the condition. Often, a family member or health care provider is the first person to notice the changes. The health care provider will obtain a detailed history of current symptoms, medical issues, medicines, and recreational drug use. The health care provider will perform a mental status examination by:  Asking questions to check for confusion.  Watching for abnormal behavior. The health care provider may perform a physical exam and order lab tests or additional studies to determine the cause of the delirium. How is this treated? Treatment of delirium depends on the cause and severity. Delirium usually goes away within days or weeks of treating the underlying cause. In the meantime, the person should not be left alone because he or she may accidentally cause self-harm. Treatment includes supportive care, such as:  Increased light during the day and decreased light at night.  Low noise level.  Uninterrupted sleep.  A regular daily schedule.  Clocks and calendars to help with orientation.  Familiar objects, including the person's pictures and clothing.  Frequent visits from familiar family and   friends.  Healthy diet.  Exercise. In more severe cases of delirium, medicine may be prescribed to  help the person to keep calm and think more clearly. Follow these instructions at home:  Any supportive care should be continued as told by the health care provider.  All medicines should be used as told by the health care provider. This is important.  The health care provider should be consulted before over-the-counter medicines, herbs, or supplements are used.  All follow-up visits should be kept as told by the health care provider. This is important.  Alcohol and recreational drugs should be avoided as told by the health care provider. Contact a health care provider if:  Symptoms do not get better or they become worse.  New symptoms of delirium develop.  Caring for the person at home does not seem safe.  Eating, drinking, or communicating stops.  There are side effects of medicines, such as changes in sleep patterns, dizziness, weight gain, restlessness, movement changes, or tremors. Get help right away if:  Serious thoughts occur about self-harm or about hurting others.  There are serious side effects of medicine, such as:  Swelling of the face, lips, tongue, or throat.  Fever, confusion, muscle spasms, or seizures. This information is not intended to replace advice given to you by your health care provider. Make sure you discuss any questions you have with your health care provider. Document Released: 09/04/2012 Document Revised: 05/18/2016 Document Reviewed: 02/03/2015 [employer]vier Interactive Patient Education  2017 [employer]  

## 2017-04-05 NOTE — Evaluation (Signed)
Physical Therapy Evaluation Patient Details Name: Melanie Obrien MRN: [id number] DOB: 05/20/1919 Today's Date: 04/05/2017   History of Present Illness  Melanie Obrien is a very pleasant 83 y.o. female with medical history significant for A. Fib status post AICD, hypertension, aortic stenosis, chronic diastolic heart failure, chronic kidney disease, hypothyroid, multiple TIAs since emergency Department chief complaint acute metabolic encephalopathy 2* acure infection - acute otitis media.  Clinical Impression  Pt admitted as above and presenting with functional mobility limitations 2* generalized weakness, AMS, balance deficits and tremors/shaking all 4 extremities.  Pt would benefit from follow up rehab in rehab setting at [facility] [place].    Follow Up Recommendations SNF    Equipment Recommendations  None recommended by PT    Recommendations for Other Services OT consult     Precautions / Restrictions Precautions Precautions: Fall Restrictions Weight Bearing Restrictions: No      Mobility  Bed Mobility Overal bed mobility: Needs Assistance Bed Mobility: Supine to Sit;Sit to Supine     Supine to sit: Max assist Sit to supine: Max assist   General bed mobility comments: Increased time and multimodal cues but with pt assisting minimally with task  Transfers Overall transfer level: Needs assistance               General transfer comment: Not attempted for safety reasons.  Pt experiencing frequent shaking/tremors all 4 extremities.  Pt able to balance in bedside sitting with Sup until tremors/shaking initiated and then requiring assist to maintain balance.  RN aware  Ambulation/Gait                Stairs            Wheelchair Mobility    Modified Rankin (Stroke Patients Only)       Balance Overall balance assessment: Needs assistance Sitting-balance support: Feet supported;Bilateral upper extremity supported Sitting balance-Leahy Scale: Poor                                        Pertinent Vitals/Pain Pain Assessment: Faces Faces Pain Scale: Hurts little more Pain Location: Pt flinches when touched on R hand and back Pain Descriptors / Indicators: Grimacing Pain Intervention(s): Limited activity within patient's tolerance;Monitored during session    Home Living Family/patient expects to be discharged to:: Assisted living               Home Equipment: Dan Humphreys - 4 wheels Additional Comments: Friend's Home    Prior Function Level of Independence: Independent with assistive device(s)         Comments: RW per previous chart     Hand Dominance   Dominant Hand: Right    Extremity/Trunk Assessment   Upper Extremity Assessment Upper Extremity Assessment: Generalized weakness;Difficult to assess due to impaired cognition    Lower Extremity Assessment Lower Extremity Assessment: Generalized weakness;Difficult to assess due to impaired cognition    Cervical / Trunk Assessment Cervical / Trunk Assessment: Kyphotic  Communication   Communication: No difficulties  Cognition Arousal/Alertness: Awake/alert Behavior During Therapy: Flat affect Overall Cognitive Status: No family/caregiver present to determine baseline cognitive functioning                                 General Comments: Pt rambling about the president and answering questions with "two" and "[place], [place], [place], [address]"  General Comments      Exercises     Assessment/Plan    PT Assessment Patient needs continued PT services  PT Problem List Decreased strength;Decreased activity tolerance;Decreased range of motion;Decreased balance;Decreased mobility;Decreased knowledge of use of DME;Decreased cognition;Pain       PT Treatment Interventions DME instruction;Gait training;Functional mobility training;Therapeutic activities;Therapeutic exercise;Patient/family education    PT Goals (Current goals can be found in the  Care Plan section)  Acute Rehab PT Goals Patient Stated Goal: No goals expressed PT Goal Formulation: Patient unable to participate in goal setting Time For Goal Achievement: 04/21/17 Potential to Achieve Goals: Fair    Frequency Min 3X/week   Barriers to discharge        Co-evaluation               End of Session Equipment Utilized During Treatment: Gait belt Activity Tolerance: Patient limited by fatigue Patient left: in bed;with call bell/phone within reach;with bed alarm set Nurse Communication: Mobility status;Other (comment) (tremors/shaking) PT Visit Diagnosis: Unsteadiness on feet (R26.81);Muscle weakness (generalized) (M62.81);Difficulty in walking, not elsewhere classified (R26.2);Other symptoms and signs involving the nervous system (R29.898)    Time: 1117-1140 PT Time Calculation (min) (ACUTE ONLY): 23 min   Charges:   PT Evaluation $PT Eval Low Complexity: 1 Procedure PT Treatments $Therapeutic Activity: 8-22 mins   PT G Codes:        Pg [phone]   Melanie Obrien 04/05/2017, 12:56 PM

## 2017-04-06 ENCOUNTER — Encounter: Payer: Self-pay | Admitting: Cardiology

## 2017-04-06 ENCOUNTER — Non-Acute Institutional Stay (SKILLED_NURSING_FACILITY): Payer: Medicare PPO | Admitting: Internal Medicine

## 2017-04-06 ENCOUNTER — Encounter: Payer: Self-pay | Admitting: *Deleted

## 2017-04-06 DIAGNOSIS — G500 Trigeminal neuralgia: Secondary | ICD-10-CM

## 2017-04-06 DIAGNOSIS — F22 Delusional disorders: Secondary | ICD-10-CM

## 2017-04-06 DIAGNOSIS — I10 Essential (primary) hypertension: Secondary | ICD-10-CM | POA: Diagnosis not present

## 2017-04-06 DIAGNOSIS — H66002 Acute suppurative otitis media without spontaneous rupture of ear drum, left ear: Secondary | ICD-10-CM

## 2017-04-06 DIAGNOSIS — N182 Chronic kidney disease, stage 2 (mild): Secondary | ICD-10-CM | POA: Diagnosis not present

## 2017-04-06 DIAGNOSIS — G9340 Encephalopathy, unspecified: Secondary | ICD-10-CM

## 2017-04-06 DIAGNOSIS — E871 Hypo-osmolality and hyponatremia: Secondary | ICD-10-CM | POA: Diagnosis not present

## 2017-04-06 DIAGNOSIS — R2681 Unsteadiness on feet: Secondary | ICD-10-CM

## 2017-04-06 DIAGNOSIS — I5032 Chronic diastolic (congestive) heart failure: Secondary | ICD-10-CM

## 2017-04-06 DIAGNOSIS — E038 Other specified hypothyroidism: Secondary | ICD-10-CM

## 2017-04-06 DIAGNOSIS — R531 Weakness: Secondary | ICD-10-CM

## 2017-04-06 DIAGNOSIS — I480 Paroxysmal atrial fibrillation: Secondary | ICD-10-CM

## 2017-04-06 NOTE — Progress Notes (Signed)
History and Physical    Provider:  Murray Hodgkins  MD Location:  [facility] Room Number: [id number] Place of Service:  SNF ([phone])  PCP: Murray Hodgkins, MD Patient Care Team: Kimber Relic, MD as PCP - General (Internal Medicine) Duke Salvia, MD as Consulting Physician (Cardiology) Corky Crafts, MD as Consulting Physician (Interventional Cardiology) Sheral Apley, MD as Attending Physician (Orthopedic Surgery)  Extended Emergency Contact Information Primary Emergency Contact: McNeill,Dr. Robin Searing [place] Home Phone: [phone] Work Phone: [phone] Mobile Phone: [phone] Relation: Daughter  Code Status: DNR Goals of Care: Advanced Directive information Advanced Directives 04/06/2017  Does Patient Have a Medical Advance Directive? Yes  Type of Estate agent of [place];Living will;Out of facility DNR (pink MOST or yellow form)  Does patient want to make changes to medical advance directive? No - Patient declined  Copy of Healthcare Power of Attorney in Chart? No - copy requested  Would patient like information on creating a medical advance directive? -  Pre-existing out of facility DNR order (yellow form or pink MOST form) Yellow form placed in chart (order not valid for inpatient use)      Chief Complaint  Patient presents with  . New Admit To SNF    HPI: Patient is a 83 y.o. female seen today for admission to [facility] SNF on 04/05/17 following hospitalization from 04/02/17 to 04/05/17. Patient presented to the hospital emergency room on 04/02/2017 with confusion. She was babbling at times. She was felt to be acutely encephalopathic. She did have an acute otitis media found. She was hyponatremic at 132 mEq per liter. This was an improvement over values obtained in March 2018.patient was started on Unasyn antibiotic for the otitis media. ENT consultant felt that this antibiotic could be discontinued at the time of her  discharge from the hospital.  Other problems included a compensated chronic diastolic congestive heart failure,paroxysmal atrial fibrillation that is currently in normal sinus rhythm, hypertension, hypothyroidism, and hyperkalemia.  Patient is currently totally dependent on others to meet all activities of daily living. She remains confused. She has unusual thought content and verbalization.  She had a psychiatric consultation 03/08/2017. [facility],certified psychiatric clinical nurse specialist, felt that she had diminished memory and showed evidence of perseveration, OCD, and anxiety.  It is hoped that her mental status will improve with time. She will undergo treatments with phys therapy and occupational therapy. Supportive personal care wil be provided.. She is a fall risk at this time.  Urine incontinence is present and she is in Depends.  Past Medical History:  Diagnosis Date  . Acute on chronic diastolic heart failure (HCC)   . AICD (automatic cardioverter/defibrillator) present   . Arthritis    "plenty" (06/17/2015)  . Atrial fibrillation (HCC)   . CHF (congestive heart failure) (HCC)   . Chronic back pain   . Chronic kidney disease    02/17/16 Bun 34, creat 1.05  . Complication of anesthesia    "30 minutes violent shaking after a dental procedure"   . Degenerative disc disease, lumbar    . Diastolic heart failure (HCC)   . Gluten intolerance    . Heart murmur   . Hypertension   . Hypothyroidism    "from the aminodarone"  . Idiopathic scoliosis    . Osteoporosis   . Paroxysmal atrial fibrillation (HCC)   . Pleural effusion 2011   "hugh w/pneumonia"  . Pneumonia 2011; 11/2014  . Sinus bradycardia   .  Stroke [facility])    TIA  . Thyroid disease   . TIA (transient ischemic attack) 2000's X 1  . Trigeminal neuralgia of left side of face 12/13/2014  . Unstable gait     Past Surgical History:  Procedure Laterality Date  . BI-VENTRICULAR IMPLANTABLE CARDIOVERTER  DEFIBRILLATOR  (CRT-D)  06/17/2015  . CATARACT EXTRACTION W/ INTRAOCULAR LENS  IMPLANT, BILATERAL Bilateral 1991-1992  . EP IMPLANTABLE DEVICE N/A 06/17/2015   Procedure: Pacemaker Implant;  Surgeon: Duke Salvia, MD;  Location: [facility] INVASIVE CV LAB;  Service: Cardiovascular;  Laterality: N/A;  . LOOP RECORDER IMPLANT  08/2013   explanted 06/17/2015  . PACEMAKER INSERTION    . TOENAIL AVULSION Left    2nd digit  . TONSILLECTOMY  1944    reports that she has never smoked. She has never used smokeless tobacco. She reports that she does not drink alcohol or use drugs. Social History   Social History  . Marital status: Unknown    Spouse name: N/A  . Number of children: N/A  . Years of education: N/A   Occupational History  . Not on file.   Social History Main Topics  . Smoking status: Never Smoker  . Smokeless tobacco: Never Used  . Alcohol use No  . Drug use: No  . Sexual activity: Not on file   Other Topics Concern  . Not on file   Social History Narrative   ** Merged History Encounter **        Functional Status Survey:    Family History  Problem Relation Age of Onset  . Stroke Mother   . Hypertension Father   . Heart attack Neg Hx     Health Maintenance  Topic Date Due  . TETANUS/TDAP  01/19/1940  . DEXA SCAN  01/18/1986  . PNA vac Low Risk Adult (1 of 2 - PCV13) 01/18/1986  . INFLUENZA VACCINE  07/25/2017    Allergies  Allergen Reactions  . Codeine Other (See Comments)    unknown  . Codeine Nausea And Vomiting    & headaches  . Levaquin [Levofloxacin In D5w] Itching  . Levaquin [Levofloxacin In D5w] Swelling and Other (See Comments)    & redness  . Loratadine Other (See Comments)    TACHYCARDIA  . Xylocaine [Lidocaine Hcl] Other (See Comments)    Uncontrolled shaking  . Xylocaine [Lidocaine Hcl] Other (See Comments)    Made her very cold & lots of shaking. Pt and daughter stated that she can take it.  . Loratadine Palpitations    Outpatient  Encounter Prescriptions as of 04/06/2017  Medication Sig  . apixaban (ELIQUIS) 2.5 MG TABS tablet Take 1 tablet (2.5 mg total) by mouth 2 (two) times daily.  . B Complex-Biotin-FA (SUPER B-50 COMPLEX PO) Take 1 tablet by mouth daily.  . cholecalciferol (VITAMIN D) 1000 units tablet Take 1,000 Units by mouth daily.  Marland Kitchen levothyroxine (SYNTHROID, LEVOTHROID) 75 MCG tablet Take 1 tablet (75 mcg total) by mouth daily before breakfast. Reported on 02/04/2016  . lisinopril (PRINIVIL,ZESTRIL) 5 MG tablet Take 1 tablet (5 mg total) by mouth daily.  . Magnesium 250 MG TABS Take 250 mg by mouth daily.   . metoprolol tartrate (LOPRESSOR) 25 MG tablet Take 1 tablet (25 mg total) by mouth 2 (two) times daily.  Marland Kitchen PANTOTHENIC ACID PO Take 100 mg by mouth daily.  . Chromium Picolinate (CHROMIUM PICOLATE PO) Take 1 tablet by mouth daily.  . [DISCONTINUED] acetaminophen (TYLENOL) 325 MG tablet Take 650  mg by mouth every 6 (six) hours as needed (for pain/fever).  . [DISCONTINUED] CALCIUM PO Take 1,000 mg by mouth daily.   No facility-administered encounter medications on file as of 04/06/2017.     Review of Systems  Constitutional: Negative for chills, diaphoresis and fever.  HENT: Positive for hearing loss. Negative for ear discharge, ear pain, nosebleeds, sore throat and tinnitus.        Rhinitis.  04/02/17 Diagnosis of left otitis media.  Eyes: Negative for photophobia, pain, discharge and redness.  Respiratory: Negative for cough, shortness of breath, wheezing and stridor.        Improved.   Cardiovascular: Positive for leg swelling.       Chronic edema BLE 1+  Gastrointestinal: Negative for abdominal pain, blood in stool, constipation, diarrhea, nausea and vomiting.       Patient states she has been gluten intolerant for the last couple of years.  Endocrine: Negative for polydipsia.  Genitourinary: Positive for frequency. Negative for dysuria, flank pain, hematuria and urgency.       Urine incntinence    Musculoskeletal: Negative for back pain, myalgias and neck pain.       Old Left humerus head fx. She denies residual pain. Limited overhead ROM. Ambulates with walker.   Skin: Negative for rash.  Allergic/Immunologic: Positive for environmental allergies.  Neurological: Positive for weakness. Negative for dizziness, tremors, seizures and headaches.  Hematological: Does not bruise/bleed easily.  Psychiatric/Behavioral: Positive for behavioral problems, confusion and decreased concentration. Negative for hallucinations. The patient is nervous/anxious.        Paranoia, suspicious.     Vitals:   04/06/17 1058  BP: (!) 141/61  Pulse: 85  Resp: 18  SpO2: 98%  Weight: 100 lb (45.4 kg)  Height:  (1.397 m)   Body mass index is 23.24 kg/m. Physical Exam  Constitutional: She is oriented to person, place, and time. She appears well-developed and well-nourished. No distress.  HENT:  Head: Normocephalic and atraumatic.  Right Ear: External ear normal.  Left Ear: External ear normal.  Nose: Nose normal.  Mouth/Throat: Oropharynx is clear and moist. No oropharyngeal exudate.  Eyes: Conjunctivae and EOM are normal. Pupils are equal, round, and reactive to light. Right eye exhibits no discharge. Left eye exhibits no discharge. No scleral icterus.  Neck: Normal range of motion. Neck supple. No JVD present. No tracheal deviation present. No thyromegaly present.  Cardiovascular: Regular rhythm and intact distal pulses.  Bradycardia present.  Exam reveals no gallop and no friction rub.   Murmur heard.  Systolic (R & L sternal border) murmur is present with a grade of 3/6  Pulmonary/Chest: Effort normal. No stridor. No respiratory distress. She has no wheezes. She has no rales. She exhibits no tenderness.  Abdominal: Soft. Bowel sounds are normal. She exhibits no distension and no mass. There is no tenderness. There is no rebound and no guarding.  Musculoskeletal: She exhibits edema and  tenderness.  On 04/16/15 Left humerus fx.  BLE chronic edema 1+  Neurological: She is alert and oriented to person, place, and time. She has normal reflexes. She displays normal reflexes. No cranial nerve deficit. She exhibits normal muscle tone. Coordination normal.  Skin: No rash noted. She is not diaphoretic. No erythema. No pallor.  Psychiatric: Her behavior is normal. Judgment normal.  Anxious and suspicious.     Labs reviewed: Basic Metabolic Panel:  Recent Labs  14/78/29 1429  04/02/17 2027 04/04/17 0334 04/05/17 0415 04/05/17 1159  NA  --   < >  133* 134* 136  --   K  --   < > 4.6 4.5 5.3* 4.0  CL  --   < > 102 105 107  --   CO2  --   < > 24 23 23   --   GLUCOSE  --   < > 107* 84 67  --   BUN  --   < > 14 12 12   --   CREATININE  --   < > 0.65 0.61 0.62  --   CALCIUM  --   < > 8.9 8.1* 8.3*  --   MG 1.9  --   --   --   --   --   PHOS 3.6  --   --   --   --   --   < > = values in this interval not displayed. Liver Function Tests:  Recent Labs  12/03/16 1053 03/24/17 1138 04/02/17 2027  AST 37 30 27  ALT 21 16 20   ALKPHOS 91 64 68  BILITOT 0.4 0.6 0.7  PROT 5.9* 5.7* 6.2*  ALBUMIN 3.4* 3.4* 3.8   No results for input(s): LIPASE, AMYLASE in the last 8760 hours. No results for input(s): AMMONIA in the last 8760 hours. CBC:  Recent Labs  12/03/16 1053  04/02/17 2027 04/04/17 0334 04/05/17 0415  WBC 8.5  < > 8.2 6.9 9.5  NEUTROABS 6.3  --   --   --   --   HGB 12.0  < > 12.4 11.2* 12.2  HCT 35.2*  < > 35.4* 32.7* 35.2*  MCV 92.9  < > 90.8 94.5 94.4  PLT 214  < > 280 231 220  < > = values in this interval not displayed. Cardiac Enzymes:  Recent Labs  12/03/16 1053  TROPONINI <0.03   BNP: Invalid input(s): POCBNP No results found for: HGBA1C Lab Results  Component Value Date   TSH 0.333 (L) 03/24/2017   Lab Results  Component Value Date   VITAMINB12 927 (H) 03/24/2017   No results found for: FOLATE No results found for: IRON, TIBC,  FERRITIN  Imaging and Procedures obtained prior to SNF admission: Ct Head Wo Contrast  Result Date: 04/02/2017 CLINICAL DATA:  Dementia, altered mental status. follow-up left middle ear effusion. EXAM: CT HEAD WITHOUT CONTRAST CT TEMPORAL BONES WITH CONTRAST TECHNIQUE: Contiguous axial images were obtained from the base of the skull through the vertex without contrast. Multidetector CT imaging of the temporal bones was performed using the standard protocol with intravenous contrast. CONTRAST:  75 CC ISOVUE 300 COMPARISON:  CT HEAD March 24, 2017 FINDINGS: CT HEAD FINDINGS BRAIN: No intraparenchymal hemorrhage, mass effect nor midline shift. The ventricles and sulci are normal for age. Patchy supratentorial white matter hypodensities within normal range for patient's age, though non-specific are most compatible with chronic small vessel ischemic disease. No acute large vascular territory infarcts. No abnormal extra-axial fluid collections. Basal cisterns are patent. VASCULAR: Moderate calcific atherosclerosis of the carotid siphons. SKULL: No skull fracture. No significant scalp soft tissue swelling. SINUSES/ORBITS: The paranasal sinuses are well-aerated.The included ocular globes and orbital contents are non-suspicious. Status post bilateral ocular lens implants. OTHER: None. CT TEMPORAL BONES FINDINGS- moderately motion degraded examination. RIGHT: External auditory canal is well formed and well aerated. Tympanic membrane is not thickened or retracted. The scutum is sharp. Well aerated middle ear including Prussak's space. Ossicles are well formed and located. Patent aditus ad antrum. Well aerated mastoid air cells without coalescence. Intact tegmen tympani.  Intact otic capsule with normal appearance of the inner ear structures. Please note, due to patient motion subtle areas of osteolysis would not be detected. No internal auditory canal expansion. No definite cerebellar pontine angle masses. LEFT: External  auditory canal is well formed and aerated. Tympanic membrane may be retracted though limited assessment due to motion. The scutum is sharp. Soft tissue throughout the middle ear including Prussak's space Ossicles are well formed and located. Patent aditus ad antrum. Soft tissue opacifies mastoid air cells without coalescence. Intact tegmen tympani. Intact otic capsule with normal appearance of the inner ear structures. Please note, due to patient motion subtle areas of osteolysis would not be detected. No internal auditory canal expansion. No definite cerebellar pontine angle masses. No obstructing nasopharyngeal mass. No dural venous sinus thrombosis a an included field-of-view. IMPRESSION: CT HEAD:  Stable negative CT HEAD for age. CT TEMPORAL BONES:  Moderately motion degraded examination. LEFT middle ear and mastoid effusion compatible with Eustation tube dysfunction without CT findings of mastoiditis or abscess. Electronically Signed   By: Awilda Metro M.D.   On: 04/02/2017 23:03   Ct Temporal Bones W Contrast  Result Date: 04/02/2017 CLINICAL DATA:  Dementia, altered mental status. follow-up left middle ear effusion. EXAM: CT HEAD WITHOUT CONTRAST CT TEMPORAL BONES WITH CONTRAST TECHNIQUE: Contiguous axial images were obtained from the base of the skull through the vertex without contrast. Multidetector CT imaging of the temporal bones was performed using the standard protocol with intravenous contrast. CONTRAST:  75 CC ISOVUE 300 COMPARISON:  CT HEAD March 24, 2017 FINDINGS: CT HEAD FINDINGS BRAIN: No intraparenchymal hemorrhage, mass effect nor midline shift. The ventricles and sulci are normal for age. Patchy supratentorial white matter hypodensities within normal range for patient's age, though non-specific are most compatible with chronic small vessel ischemic disease. No acute large vascular territory infarcts. No abnormal extra-axial fluid collections. Basal cisterns are patent. VASCULAR:  Moderate calcific atherosclerosis of the carotid siphons. SKULL: No skull fracture. No significant scalp soft tissue swelling. SINUSES/ORBITS: The paranasal sinuses are well-aerated.The included ocular globes and orbital contents are non-suspicious. Status post bilateral ocular lens implants. OTHER: None. CT TEMPORAL BONES FINDINGS- moderately motion degraded examination. RIGHT: External auditory canal is well formed and well aerated. Tympanic membrane is not thickened or retracted. The scutum is sharp. Well aerated middle ear including Prussak's space. Ossicles are well formed and located. Patent aditus ad antrum. Well aerated mastoid air cells without coalescence. Intact tegmen tympani. Intact otic capsule with normal appearance of the inner ear structures. Please note, due to patient motion subtle areas of osteolysis would not be detected. No internal auditory canal expansion. No definite cerebellar pontine angle masses. LEFT: External auditory canal is well formed and aerated. Tympanic membrane may be retracted though limited assessment due to motion. The scutum is sharp. Soft tissue throughout the middle ear including Prussak's space Ossicles are well formed and located. Patent aditus ad antrum. Soft tissue opacifies mastoid air cells without coalescence. Intact tegmen tympani. Intact otic capsule with normal appearance of the inner ear structures. Please note, due to patient motion subtle areas of osteolysis would not be detected. No internal auditory canal expansion. No definite cerebellar pontine angle masses. No obstructing nasopharyngeal mass. No dural venous sinus thrombosis a an included field-of-view. IMPRESSION: CT HEAD:  Stable negative CT HEAD for age. CT TEMPORAL BONES:  Moderately motion degraded examination. LEFT middle ear and mastoid effusion compatible with Eustation tube dysfunction without CT findings of mastoiditis or abscess. Electronically  Signed   By: Awilda Metro M.D.   On:  04/02/2017 23:03    Assessment/Plan 1. Acute encephalopathy Remains confused  2. Acute suppurative otitis media of left ear without spontaneous rupture of tympanic membrane, recurrence not specified resolved  3. Stage 2 chronic kidney disease Latest BUN and Creatinine are normal  4. Hyponatremia resolved  5. Paroxysmal atrial fibrillation (HCC) Currently in NSR. Anticoagulated with Eliquis.  6. Chronic diastolic congestive heart failure (HCC) compensated  7. Essential hypertension Adequate;ly controlled  8. Other specified hypothyroidism TSH is still low at 0.333. She had a reduction in levothyroxine from 88 down to 75 g daily just a few weeks ago. Followup of TSH should be done in early May 2018.  9. Paranoia (psychosis) (HCC) Quiet. Odd verbalizations. Appears fearful.  10. Trigeminal neuralgia of left side of face asymptomatic  11. Unstable gait Engage in PT for strengthening and instruction in safe mobility  12. Weakness PT for strengthening

## 2017-04-08 LAB — CULTURE, BLOOD (ROUTINE X 2)
CULTURE: NO GROWTH
Culture: NO GROWTH
SPECIAL REQUESTS: ADEQUATE

## 2017-04-10 ENCOUNTER — Telehealth: Payer: Self-pay

## 2017-04-10 NOTE — Telephone Encounter (Signed)
This is a patient of [facility], who was admitted to [facility] after hospitalization. [facility] - Hospital F/U is needed. Hospital discharge from [facility] on 04/05/2017. Pt may have already been seen after d/c

## 2017-04-16 ENCOUNTER — Non-Acute Institutional Stay (SKILLED_NURSING_FACILITY): Payer: Medicare PPO | Admitting: Internal Medicine

## 2017-04-16 ENCOUNTER — Encounter: Payer: Self-pay | Admitting: Internal Medicine

## 2017-04-16 DIAGNOSIS — F22 Delusional disorders: Secondary | ICD-10-CM | POA: Diagnosis not present

## 2017-04-16 MED ORDER — RISPERIDONE 0.25 MG PO TABS: ORAL_TABLET | ORAL | 5 refills | Status: DC

## 2017-04-16 NOTE — Progress Notes (Signed)
Progress Note    Location:  [facility] Room Number: N38 Place of Service:  SNF [phone]) Provider: Murray Hodgkins, MD  Patient Care Team: Kimber Relic, MD as PCP - General (Internal Medicine) Duke Salvia, MD as Consulting Physician (Cardiology) Corky Crafts, MD as Consulting Physician (Interventional Cardiology) Sheral Apley, MD as Attending Physician (Orthopedic Surgery)  Extended Emergency Contact Information Primary Emergency Contact: Obrien,Dr. Robin Searing [place] Home Phone: [phone] Work Phone: [phone] Mobile Phone: [phone] Relation: Daughter  Code Status:  DNR Goals of care: Advanced Directive information Advanced Directives 04/16/2017  Does Patient Have a Medical Advance Directive? Yes  Type of Estate agent of [place];Living will;Out of facility DNR (pink MOST or yellow form)  Does patient want to make changes to medical advance directive? -  Copy of Healthcare Power of Attorney in Chart? Yes  Would patient like information on creating a medical advance directive? -  Pre-existing out of facility DNR order (yellow form or pink MOST form) Yellow form placed in chart (order not valid for inpatient use);Pink MOST form placed in chart (order not valid for inpatient use)     Chief Complaint  Patient presents with  . Acute Visit     patient combative, hitting staff, racial slur to the staff.     HPI:  Pt is a 83 y.o. female seen today for an acute visit for reevaluation of mental status.  This patient had a terrible wee She was fighting with staff. She refused to allow personal care She was calling people names using racist terminology. She appears frightened. She is not sleeping well.  Patient has odd verbalizations. She is very repetitive. She is in some distress with tearfulness at times.  Patient was approachable today on my exam.She does not appear to be in any pain. There is no  dyspnea or tachycardia at this time. There is no suprapubic tenderness.  I discussed the situation with her daughter, Melanie Obrien. I propose that we use risperidone0.25 mg twice daily. She wants to discuss this with her brother and will get me her answer. She would like a urine specimen checked. She also asked if we could turn off the pacemaker. She asked if we could use Ativan gel.  I told her that in my experience, the Ativan gel is not likely to be helpful and I would prefer the risperidone. I would have to refer her to the cardiologist to turn off the pacemaker. Her current state of mind and erratic behavior would make this a very difficult trip for her and anybody else involved as well as the cardiologist office. Although her current state of her life is very poor, my suggestion is that we could improve it by treating her paranoia.   Past Medical History:  Diagnosis Date  . Acute encephalopathy 03/24/2017  . Acute on chronic diastolic heart failure (HCC)   . AICD (automatic cardioverter/defibrillator) present   . Arthritis    "plenty" (06/17/2015)  . Atrial fibrillation (HCC)   . CHF (congestive heart failure) (HCC)   . Chronic back pain   . Chronic kidney disease    02/17/16 Bun 34, creat 1.05  . Complication of anesthesia    "30 minutes violent shaking after a dental procedure"   . Degenerative disc disease, lumbar    . Diastolic heart failure (HCC)   . Gluten intolerance    . Heart murmur   . Hypertension   . Hypothyroidism    "  from the aminodarone"  . Idiopathic scoliosis    . Osteoporosis   . Paroxysmal atrial fibrillation (HCC)   . Pleural effusion 2011   "hugh w/pneumonia"  . Pneumonia 2011; 11/2014  . Sinus bradycardia   . Stroke [facility])    TIA  . Thyroid disease   . TIA (transient ischemic attack) 2000's X 1  . Trigeminal neuralgia of left side of face 12/13/2014  . Unstable gait     Past Surgical History:  Procedure Laterality Date  . BI-VENTRICULAR  IMPLANTABLE CARDIOVERTER DEFIBRILLATOR  (CRT-D)  06/17/2015  . CATARACT EXTRACTION W/ INTRAOCULAR LENS  IMPLANT, BILATERAL Bilateral 1991-1992  . EP IMPLANTABLE DEVICE N/A 06/17/2015   Procedure: Pacemaker Implant;  Surgeon: Duke Salvia, MD;  Location: [facility] INVASIVE CV LAB;  Service: Cardiovascular;  Laterality: N/A;  . LOOP RECORDER IMPLANT  08/2013   explanted 06/17/2015  . PACEMAKER INSERTION    . TOENAIL AVULSION Left    2nd digit  . TONSILLECTOMY  1944    Allergies  Allergen Reactions  . Codeine Other (See Comments)    unknown  . Codeine Nausea And Vomiting    & headaches  . Levaquin [Levofloxacin In D5w] Itching  . Levaquin [Levofloxacin In D5w] Swelling and Other (See Comments)    & redness  . Loratadine Other (See Comments)    TACHYCARDIA  . Xylocaine [Lidocaine Hcl] Other (See Comments)    Uncontrolled shaking  . Xylocaine [Lidocaine Hcl] Other (See Comments)    Made her very cold & lots of shaking. Pt and daughter stated that she can take it.  . Loratadine Palpitations    Outpatient Encounter Prescriptions as of 04/16/2017  Medication Sig  . acetaminophen (TYLENOL) 325 MG tablet Take 650 mg by mouth. Take two tablets every 6 hours as needed for pain/fever  . apixaban (ELIQUIS) 2.5 MG TABS tablet Take 1 tablet (2.5 mg total) by mouth 2 (two) times daily.  Marland Kitchen levothyroxine (SYNTHROID, LEVOTHROID) 75 MCG tablet Take 1 tablet (75 mcg total) by mouth daily before breakfast. Reported on 02/04/2016  . lisinopril (PRINIVIL,ZESTRIL) 5 MG tablet Take 1 tablet (5 mg total) by mouth daily.  . metoprolol tartrate (LOPRESSOR) 25 MG tablet Take 1 tablet (25 mg total) by mouth 2 (two) times daily.  . [DISCONTINUED] B Complex-Biotin-FA (SUPER B-50 COMPLEX PO) Take 1 tablet by mouth daily.  . [DISCONTINUED] cholecalciferol (VITAMIN D) 1000 units tablet Take 1,000 Units by mouth daily.  . [DISCONTINUED] Chromium Picolinate (CHROMIUM PICOLATE PO) Take 1 tablet by mouth daily.  .  [DISCONTINUED] Magnesium 250 MG TABS Take 250 mg by mouth daily.   . [DISCONTINUED] PANTOTHENIC ACID PO Take 100 mg by mouth daily.   No facility-administered encounter medications on file as of 04/16/2017.     Review of Systems  Constitutional: Negative for chills, diaphoresis and fever.  HENT: Positive for hearing loss. Negative for ear discharge, ear pain, nosebleeds, sore throat and tinnitus.        Rhinitis.  04/02/17 Diagnosis of left otitis media.  Eyes: Negative for photophobia, pain, discharge and redness.  Respiratory: Negative for cough, shortness of breath, wheezing and stridor.        Improved.   Cardiovascular: Positive for leg swelling.       Chronic edema BLE 1+  Gastrointestinal: Negative for abdominal pain, blood in stool, constipation, diarrhea, nausea and vomiting.       Patient states she has been gluten intolerant for the last couple of years.  Endocrine: Negative for  polydipsia.  Genitourinary: Positive for frequency. Negative for dysuria, flank pain, hematuria and urgency.       Urine incntinence  Musculoskeletal: Negative for back pain, myalgias and neck pain.       Old Left humerus head fx. She denies residual pain. Limited overhead ROM. Ambulates with walker.   Skin: Negative for rash.  Allergic/Immunologic: Positive for environmental allergies.  Neurological: Positive for weakness. Negative for dizziness, tremors, seizures and headaches.  Hematological: Does not bruise/bleed easily.  Psychiatric/Behavioral: Positive for behavioral problems, confusion and decreased concentration. Negative for hallucinations. The patient is nervous/anxious.        Paranoia, suspicious.     Immunization History  Administered Date(s) Administered  . Influenza-Unspecified 10/05/2016  . PPD Test 01/11/2016  . Td 03/05/2014   Pertinent  Health Maintenance Due  Topic Date Due  . DEXA SCAN  01/18/1986  . PNA vac Low Risk Adult (1 of 2 - PCV13) 01/18/1986  . INFLUENZA VACCINE   07/25/2017   Fall Risk  02/18/2016 04/24/2015  Falls in the past year? No Yes  Number falls in past yr: - 2 or more  Injury with Fall? - Yes  Risk Factor Category  - High Fall Risk  Risk for fall due to : - History of fall(s);Impaired balance/gait  Follow up - Falls evaluation completed   Functional Status Survey:    Vitals:   04/16/17 1003  BP: 121/64  Pulse: 72  Resp: 18  Temp: 97.6 F (36.4 C)  SpO2: 95%  Weight: 98 lb 3.2 oz (44.5 kg)  Height: 4\' 7"  (1.397 m)   Body mass index is 22.82 kg/m. Physical Exam  Constitutional: She is oriented to person, place, and time. She appears well-developed and well-nourished. No distress.  HENT:  Head: Normocephalic and atraumatic.  Right Ear: External ear normal.  Left Ear: External ear normal.  Nose: Nose normal.  Mouth/Throat: Oropharynx is clear and moist. No oropharyngeal exudate.  Eyes: Conjunctivae and EOM are normal. Pupils are equal, round, and reactive to light. Right eye exhibits no discharge. Left eye exhibits no discharge. No scleral icterus.  Neck: Normal range of motion. Neck supple. No JVD present. No tracheal deviation present. No thyromegaly present.  Cardiovascular: Regular rhythm and intact distal pulses.  Bradycardia present.  Exam reveals no gallop and no friction rub.   Murmur heard.  Systolic (R & L sternal border) murmur is present with a grade of 3/6  Pulmonary/Chest: Effort normal. No stridor. No respiratory distress. She has no wheezes. She has no rales. She exhibits no tenderness.  Abdominal: Soft. Bowel sounds are normal. She exhibits no distension and no mass. There is no tenderness. There is no rebound and no guarding.  Musculoskeletal: She exhibits edema and tenderness.  On 04/16/15 Left humerus fx.  BLE chronic edema 1+  Neurological: She is alert and oriented to person, place, and time. She has normal reflexes. She displays normal reflexes. No cranial nerve deficit. She exhibits normal muscle tone.  Coordination normal.  Skin: No rash noted. She is not diaphoretic. No erythema. No pallor.  Psychiatric:  Anxious and suspicious.     Labs reviewed:  Recent Labs  03/24/17 1429  04/02/17 2027 04/04/17 0334 04/05/17 0415 04/05/17 1159  NA  --   < > 133* 134* 136  --   K  --   < > 4.6 4.5 5.3* 4.0  CL  --   < > 102 105 107  --   CO2  --   < >  24 23 23   --   GLUCOSE  --   < > 107* 84 67  --   BUN  --   < > 14 12 12   --   CREATININE  --   < > 0.65 0.61 0.62  --   CALCIUM  --   < > 8.9 8.1* 8.3*  --   MG 1.9  --   --   --   --   --   PHOS 3.6  --   --   --   --   --   < > = values in this interval not displayed.  Recent Labs  12/03/16 1053 03/24/17 1138 04/02/17 2027  AST 37 30 27  ALT 21 16 20   ALKPHOS 91 64 68  BILITOT 0.4 0.6 0.7  PROT 5.9* 5.7* 6.2*  ALBUMIN 3.4* 3.4* 3.8    Recent Labs  12/03/16 1053  04/02/17 2027 04/04/17 0334 04/05/17 0415  WBC 8.5  < > 8.2 6.9 9.5  NEUTROABS 6.3  --   --   --   --   HGB 12.0  < > 12.4 11.2* 12.2  HCT 35.2*  < > 35.4* 32.7* 35.2*  MCV 92.9  < > 90.8 94.5 94.4  PLT 214  < > 280 231 220  < > = values in this interval not displayed. Lab Results  Component Value Date   TSH 0.333 (L) 03/24/2017   No results found for: HGBA1C No results found for: CHOL, HDL, LDLCALC, LDLDIRECT, TRIG, CHOLHDL  Significant Diagnostic Results in last 30 days:  Dg Chest 2 View  Result Date: 03/24/2017 CLINICAL DATA:  Pt states she has known fluid in lungs, and is denying any chest pain. Xray obtained for chest pain, but pt is confused. EXAM: CHEST  2 VIEW COMPARISON:  11/24/2015 FINDINGS: The cardiac silhouette is mildly enlarged. No mediastinal or hilar masses or convincing adenopathy. Left anterior chest wall sequential pacemaker is stable and well positioned. Clear lungs.  No pleural effusion.  No pneumothorax. Skeletal structures are demineralized. There is an old healed proximal left humerus fracture. IMPRESSION: No acute cardiopulmonary  disease. Electronically Signed   By: Amie Portland M.D.   On: 03/24/2017 13:15   Ct Head Wo Contrast  Result Date: 04/02/2017 CLINICAL DATA:  Dementia, altered mental status. follow-up left middle ear effusion. EXAM: CT HEAD WITHOUT CONTRAST CT TEMPORAL BONES WITH CONTRAST TECHNIQUE: Contiguous axial images were obtained from the base of the skull through the vertex without contrast. Multidetector CT imaging of the temporal bones was performed using the standard protocol with intravenous contrast. CONTRAST:  75 CC ISOVUE 300 COMPARISON:  CT HEAD March 24, 2017 FINDINGS: CT HEAD FINDINGS BRAIN: No intraparenchymal hemorrhage, mass effect nor midline shift. The ventricles and sulci are normal for age. Patchy supratentorial white matter hypodensities within normal range for patient's age, though non-specific are most compatible with chronic small vessel ischemic disease. No acute large vascular territory infarcts. No abnormal extra-axial fluid collections. Basal cisterns are patent. VASCULAR: Moderate calcific atherosclerosis of the carotid siphons. SKULL: No skull fracture. No significant scalp soft tissue swelling. SINUSES/ORBITS: The paranasal sinuses are well-aerated.The included ocular globes and orbital contents are non-suspicious. Status post bilateral ocular lens implants. OTHER: None. CT TEMPORAL BONES FINDINGS- moderately motion degraded examination. RIGHT: External auditory canal is well formed and well aerated. Tympanic membrane is not thickened or retracted. The scutum is sharp. Well aerated middle ear including Prussak's space. Ossicles are well formed and located. Patent  aditus ad antrum. Well aerated mastoid air cells without coalescence. Intact tegmen tympani. Intact otic capsule with normal appearance of the inner ear structures. Please note, due to patient motion subtle areas of osteolysis would not be detected. No internal auditory canal expansion. No definite cerebellar pontine angle masses.  LEFT: External auditory canal is well formed and aerated. Tympanic membrane may be retracted though limited assessment due to motion. The scutum is sharp. Soft tissue throughout the middle ear including Prussak's space Ossicles are well formed and located. Patent aditus ad antrum. Soft tissue opacifies mastoid air cells without coalescence. Intact tegmen tympani. Intact otic capsule with normal appearance of the inner ear structures. Please note, due to patient motion subtle areas of osteolysis would not be detected. No internal auditory canal expansion. No definite cerebellar pontine angle masses. No obstructing nasopharyngeal mass. No dural venous sinus thrombosis a an included field-of-view. IMPRESSION: CT HEAD:  Stable negative CT HEAD for age. CT TEMPORAL BONES:  Moderately motion degraded examination. LEFT middle ear and mastoid effusion compatible with Eustation tube dysfunction without CT findings of mastoiditis or abscess. Electronically Signed   By: Awilda Metro M.D.   On: 04/02/2017 23:03   Ct Head Wo Contrast  Result Date: 03/24/2017 CLINICAL DATA:  Larey Seat several weeks ago, confusion, evaluate encephalopathy, history paroxysmal atrial fibrillation, stroke, CHF, hypertension, diastolic heart failure EXAM: CT HEAD WITHOUT CONTRAST TECHNIQUE: Contiguous axial images were obtained from the base of the skull through the vertex without intravenous contrast. Sagittal and coronal MPR images reconstructed from axial data set. COMPARISON:  04/17/2015 FINDINGS: Brain: Generalized atrophy. Normal ventricular morphology. No midline shift or mass effect. Small vessel chronic ischemic changes of deep cerebral white matter. No intracranial hemorrhage, mass lesion, evidence of acute infarction, or extra-axial fluid collection. Vascular: Atherosclerotic calcifications at the carotid siphons Skull: Demineralized but intact Sinuses/Orbits: Increased opacification of LEFT mastoid air cells and LEFT middle ear cavity  Other: N/A IMPRESSION: Atrophy with small vessel chronic ischemic changes of deep cerebral white matter. No acute intracranial abnormalities. Increased LEFT mastoid effusion and LEFT middle ear opacification. Electronically Signed   By: Ulyses Southward M.D.   On: 03/24/2017 15:29   Ct Temporal Bones W Contrast  Result Date: 04/02/2017 CLINICAL DATA:  Dementia, altered mental status. follow-up left middle ear effusion. EXAM: CT HEAD WITHOUT CONTRAST CT TEMPORAL BONES WITH CONTRAST TECHNIQUE: Contiguous axial images were obtained from the base of the skull through the vertex without contrast. Multidetector CT imaging of the temporal bones was performed using the standard protocol with intravenous contrast. CONTRAST:  75 CC ISOVUE 300 COMPARISON:  CT HEAD March 24, 2017 FINDINGS: CT HEAD FINDINGS BRAIN: No intraparenchymal hemorrhage, mass effect nor midline shift. The ventricles and sulci are normal for age. Patchy supratentorial white matter hypodensities within normal range for patient's age, though non-specific are most compatible with chronic small vessel ischemic disease. No acute large vascular territory infarcts. No abnormal extra-axial fluid collections. Basal cisterns are patent. VASCULAR: Moderate calcific atherosclerosis of the carotid siphons. SKULL: No skull fracture. No significant scalp soft tissue swelling. SINUSES/ORBITS: The paranasal sinuses are well-aerated.The included ocular globes and orbital contents are non-suspicious. Status post bilateral ocular lens implants. OTHER: None. CT TEMPORAL BONES FINDINGS- moderately motion degraded examination. RIGHT: External auditory canal is well formed and well aerated. Tympanic membrane is not thickened or retracted. The scutum is sharp. Well aerated middle ear including Prussak's space. Ossicles are well formed and located. Patent aditus ad antrum. Well aerated mastoid air cells  without coalescence. Intact tegmen tympani. Intact otic capsule with normal  appearance of the inner ear structures. Please note, due to patient motion subtle areas of osteolysis would not be detected. No internal auditory canal expansion. No definite cerebellar pontine angle masses. LEFT: External auditory canal is well formed and aerated. Tympanic membrane may be retracted though limited assessment due to motion. The scutum is sharp. Soft tissue throughout the middle ear including Prussak's space Ossicles are well formed and located. Patent aditus ad antrum. Soft tissue opacifies mastoid air cells without coalescence. Intact tegmen tympani. Intact otic capsule with normal appearance of the inner ear structures. Please note, due to patient motion subtle areas of osteolysis would not be detected. No internal auditory canal expansion. No definite cerebellar pontine angle masses. No obstructing nasopharyngeal mass. No dural venous sinus thrombosis a an included field-of-view. IMPRESSION: CT HEAD:  Stable negative CT HEAD for age. CT TEMPORAL BONES:  Moderately motion degraded examination. LEFT middle ear and mastoid effusion compatible with Eustation tube dysfunction without CT findings of mastoiditis or abscess. Electronically Signed   By: Awilda Metro M.D.   On: 04/02/2017 23:03    Assessment/Plan 1. Paranoid psychosis (HCC) Discussed with her daughter She will approve if her brother approves. Increase dose in 48 hours if behavior is not improved. - risperiDONE (RISPERDAL) 0.25 MG tablet; Take one tablet in the morning and again at about 4:30 PM to treat paranoia and agitation  Dispense: 60 tablet; Refill: 5

## 2017-05-15 ENCOUNTER — Encounter: Payer: Self-pay | Admitting: Nurse Practitioner

## 2017-05-15 ENCOUNTER — Non-Acute Institutional Stay (SKILLED_NURSING_FACILITY): Payer: Medicare PPO | Admitting: Nurse Practitioner

## 2017-05-15 DIAGNOSIS — I10 Essential (primary) hypertension: Secondary | ICD-10-CM

## 2017-05-15 DIAGNOSIS — E038 Other specified hypothyroidism: Secondary | ICD-10-CM | POA: Diagnosis not present

## 2017-05-15 DIAGNOSIS — I5032 Chronic diastolic (congestive) heart failure: Secondary | ICD-10-CM | POA: Diagnosis not present

## 2017-05-15 DIAGNOSIS — N182 Chronic kidney disease, stage 2 (mild): Secondary | ICD-10-CM | POA: Diagnosis not present

## 2017-05-15 DIAGNOSIS — F22 Delusional disorders: Secondary | ICD-10-CM | POA: Diagnosis not present

## 2017-05-15 DIAGNOSIS — E871 Hypo-osmolality and hyponatremia: Secondary | ICD-10-CM

## 2017-05-15 DIAGNOSIS — I480 Paroxysmal atrial fibrillation: Secondary | ICD-10-CM

## 2017-05-15 DIAGNOSIS — R600 Localized edema: Secondary | ICD-10-CM

## 2017-05-15 NOTE — Progress Notes (Signed)
Patient ID: RAMAH LANGHANS, female   DOB: [birth year], 83 y.o.   MRN: [id number]  Location:    [facility] Room Number: 80 Place of Service:  SNF (31) Provider: Arna Snipe Aryon Nham NP  Kimber Relic, MD  Patient Care Team: Kimber Relic, MD as PCP - General (Internal Medicine) Duke Salvia, MD as Consulting Physician (Cardiology) Corky Crafts, MD as Consulting Physician (Interventional Cardiology) Sheral Apley, MD as Attending Physician (Orthopedic Surgery)  Extended Emergency Contact Information Primary Emergency Contact: McNeill,Dr. Robin Searing [place] Home Phone: [phone] Work Phone: [phone] Mobile Phone: [phone] Relation: Daughter  Code Status:  DNR Goals of care: Advanced Directive information Advanced Directives 05/15/2017  Does Patient Have a Medical Advance Directive? Yes  Type of Estate agent of [place];Living will;Out of facility DNR (pink MOST or yellow form)  Does patient want to make changes to medical advance directive? No - Patient declined  Copy of Healthcare Power of Attorney in Chart? Yes  Would patient like information on creating a medical advance directive? -  Pre-existing out of facility DNR order (yellow form or pink MOST form) Yellow form placed in chart (order not valid for inpatient use)     Chief Complaint  Patient presents with  . Medical Management of Chronic Issues    HPI:  Pt is a 83 y.o. female seen today for evaluation of her chronic medical conditions:   Dr. Selena Batten, the patient daughter and POA reported her mother's abnormal thoughts recently and general gradual decline, stabilizing taking Risperdal 0.25mg  daily. Hx of Afib, heart rate is in control on Metoprolol 25mg  bid, HTN, controlled, taking Metoprolol 25mg  bid, Lisinopril 5mg  qd, TIA/Afib, taking Eliquis 2.5mg  bid. 04/05/17 TSH 0.333. Current Levothyroxine qd   Past Medical History:  Diagnosis Date  . Acute  encephalopathy 03/24/2017  . Acute on chronic diastolic heart failure (HCC)   . AICD (automatic cardioverter/defibrillator) present   . Arthritis    "plenty" (06/17/2015)  . Atrial fibrillation (HCC)   . CHF (congestive heart failure) (HCC)   . Chronic back pain   . Chronic kidney disease    02/17/16 Bun 34, creat 1.05  . Complication of anesthesia    "30 minutes violent shaking after a dental procedure"   . Degenerative disc disease, lumbar    . Diastolic heart failure (HCC)   . Gluten intolerance    . Heart murmur   . Hypertension   . Hypothyroidism    "from the aminodarone"  . Idiopathic scoliosis    . Osteoporosis   . Paroxysmal atrial fibrillation (HCC)   . Pleural effusion 2011   "hugh w/pneumonia"  . Pneumonia 2011; 11/2014  . Sinus bradycardia   . Stroke [facility])    TIA  . Thyroid disease   . TIA (transient ischemic attack) 2000's X 1  . Trigeminal neuralgia of left side of face 12/13/2014  . Unstable gait     Past Surgical History:  Procedure Laterality Date  . BI-VENTRICULAR IMPLANTABLE CARDIOVERTER DEFIBRILLATOR  (CRT-D)  06/17/2015  . CATARACT EXTRACTION W/ INTRAOCULAR LENS  IMPLANT, BILATERAL Bilateral 1991-1992  . EP IMPLANTABLE DEVICE N/A 06/17/2015   Procedure: Pacemaker Implant;  Surgeon: Duke Salvia, MD;  Location: [facility] INVASIVE CV LAB;  Service: Cardiovascular;  Laterality: N/A;  . LOOP RECORDER IMPLANT  08/2013   explanted 06/17/2015  . PACEMAKER INSERTION    . TOENAIL AVULSION Left    2nd digit  . TONSILLECTOMY  1944  Allergies  Allergen Reactions  . Codeine Other (See Comments)    unknown  . Codeine Nausea And Vomiting    & headaches  . Levaquin [Levofloxacin In D5w] Itching  . Levaquin [Levofloxacin In D5w] Swelling and Other (See Comments)    & redness  . Loratadine Other (See Comments)    TACHYCARDIA  . Xylocaine [Lidocaine Hcl] Other (See Comments)    Uncontrolled shaking  . Xylocaine [Lidocaine Hcl] Other (See Comments)    Made her very  cold & lots of shaking. Pt and daughter stated that she can take it.  . Loratadine Palpitations    Allergies as of 05/15/2017      Reactions   Codeine Other (See Comments)   unknown   Codeine Nausea And Vomiting   & headaches   Levaquin [levofloxacin In D5w] Itching   Levaquin [levofloxacin In D5w] Swelling, Other (See Comments)   & redness   Loratadine Other (See Comments)   TACHYCARDIA   Xylocaine [lidocaine Hcl] Other (See Comments)   Uncontrolled shaking   Xylocaine [lidocaine Hcl] Other (See Comments)   Made her very cold & lots of shaking. Pt and daughter stated that she can take it.   Loratadine Palpitations      Medication List       Accurate as of 05/15/17 11:59 PM. Always use your most recent med list.          acetaminophen 325 MG tablet Commonly known as:  TYLENOL Take 650 mg by mouth. Take two tablets every 6 hours as needed for pain/fever   apixaban 2.5 MG Tabs tablet Commonly known as:  ELIQUIS Take 1 tablet (2.5 mg total) by mouth 2 (two) times daily.   feeding supplement Liqd Take 90 mLs by mouth 3 (three) times daily between meals.   fexofenadine 180 MG tablet Commonly known as:  ALLEGRA Take 180 mg by mouth daily.   levothyroxine 75 MCG tablet Commonly known as:  SYNTHROID, LEVOTHROID Take 1 tablet (75 mcg total) by mouth daily before breakfast. Reported on 02/04/2016   lisinopril 5 MG tablet Commonly known as:  PRINIVIL,ZESTRIL Take 1 tablet (5 mg total) by mouth daily.   magnesium hydroxide 400 MG/5ML suspension Commonly known as:  MILK OF MAGNESIA Take 5 mLs by mouth daily as needed for mild constipation.   metoprolol tartrate 25 MG tablet Commonly known as:  LOPRESSOR Take 1 tablet (25 mg total) by mouth 2 (two) times daily.   risperiDONE 0.25 MG tablet Commonly known as:  RISPERDAL Take one tablet in the morning and again at about 4:30 PM to treat paranoia and agitation       Review of Systems  Constitutional: Negative for  chills, diaphoresis and fever.  HENT: Positive for hearing loss. Negative for ear discharge, ear pain, nosebleeds, sore throat and tinnitus.        Rhinitis.   Eyes: Negative for photophobia, pain, discharge and redness.  Respiratory: Positive for shortness of breath. Negative for cough, wheezing and stridor.        Improved.   Cardiovascular: Positive for leg swelling.       Chronic edema BLE trace  Gastrointestinal: Negative for abdominal pain, blood in stool, constipation, diarrhea, nausea and vomiting.       Patient states she has been gluten intolerant for the last couple of years.  Endocrine: Negative for polydipsia.  Genitourinary: Positive for frequency. Negative for dysuria, flank pain, hematuria and urgency.       Urine incntinence  Musculoskeletal:  Negative for back pain, myalgias and neck pain.       Left humerus head fx. Limited overhead ROM. Ambulates with walker.   Skin: Negative for rash.       Massive ecchymoses left arm   Allergic/Immunologic: Positive for environmental allergies.  Neurological: Positive for weakness. Negative for dizziness, tremors, seizures and headaches.  Hematological: Does not bruise/bleed easily.  Psychiatric/Behavioral: Positive for behavioral problems, confusion and decreased concentration. Negative for hallucinations. The patient is nervous/anxious.        Paranoia, suspicious-stabilized.     Immunization History  Administered Date(s) Administered  . Influenza-Unspecified 10/05/2016  . PPD Test 01/11/2016  . Td 03/05/2014   Pertinent  Health Maintenance Due  Topic Date Due  . DEXA SCAN  01/18/1986  . PNA vac Low Risk Adult (1 of 2 - PCV13) 01/18/1986  . INFLUENZA VACCINE  07/25/2017   Fall Risk  02/18/2016 04/24/2015  Falls in the past year? No Yes  Number falls in past yr: - 2 or more  Injury with Fall? - Yes  Risk Factor Category  - High Fall Risk  Risk for fall due to : - History of fall(s);Impaired balance/gait  Follow up - Falls  evaluation completed   Functional Status Survey:    Vitals:   05/15/17 1203  BP: 124/88  Pulse: 76  Resp: 20  Temp: 98 F (36.7 C)  SpO2: 100%  Weight: 94 lb 4.8 oz (42.8 kg)  Height: 4\' 7"  (1.397 m)   Body mass index is 21.92 kg/m. Physical Exam  Constitutional: She is oriented to person, place, and time. She appears well-developed and well-nourished. No distress.  HENT:  Head: Normocephalic and atraumatic.  Right Ear: External ear normal.  Left Ear: External ear normal.  Nose: Nose normal.  Mouth/Throat: Oropharynx is clear and moist. No oropharyngeal exudate.  Eyes: Conjunctivae and EOM are normal. Pupils are equal, round, and reactive to light. Right eye exhibits no discharge. Left eye exhibits no discharge. No scleral icterus.  Neck: Normal range of motion. Neck supple. No JVD present. No tracheal deviation present. No thyromegaly present.  Cardiovascular: Intact distal pulses.  An irregular rhythm present. Bradycardia present.  Exam reveals no gallop and no friction rub.   Murmur heard.  Systolic (R & L sternal border) murmur is present with a grade of 3/6  Irregular heart beats. Systolic murmur 3-4/6  Pulmonary/Chest: Effort normal. No stridor. No respiratory distress. She has no wheezes. She has no rales. She exhibits no tenderness.  Abdominal: Soft. Bowel sounds are normal. She exhibits no distension and no mass. There is no tenderness. There is no rebound and no guarding.  Musculoskeletal: She exhibits edema and tenderness.   BLE chronic edema trace only.   Neurological: She is alert and oriented to person, place, and time. She has normal reflexes. No cranial nerve deficit. She exhibits normal muscle tone. Coordination normal.  Skin: No rash noted. She is not diaphoretic. No erythema. No pallor.  Psychiatric: Her behavior is normal. Judgment normal.  Anxious and suspicious-resolved    Labs reviewed:  Recent Labs  03/24/17 1429  04/02/17 2027 04/04/17 0334  04/05/17 0415 04/05/17 1159  NA  --   < > 133* 134* 136  --   K  --   < > 4.6 4.5 5.3* 4.0  CL  --   < > 102 105 107  --   CO2  --   < > 24 23 23   --   GLUCOSE  --   < >  107* 84 67  --   BUN  --   < > 14 12 12   --   CREATININE  --   < > 0.65 0.61 0.62  --   CALCIUM  --   < > 8.9 8.1* 8.3*  --   MG 1.9  --   --   --   --   --   PHOS 3.6  --   --   --   --   --   < > = values in this interval not displayed.  Recent Labs  12/03/16 1053 03/24/17 1138 04/02/17 2027  AST 37 30 27  ALT 21 16 20   ALKPHOS 91 64 68  BILITOT 0.4 0.6 0.7  PROT 5.9* 5.7* 6.2*  ALBUMIN 3.4* 3.4* 3.8    Recent Labs  12/03/16 1053  04/02/17 2027 04/04/17 0334 04/05/17 0415  WBC 8.5  < > 8.2 6.9 9.5  NEUTROABS 6.3  --   --   --   --   HGB 12.0  < > 12.4 11.2* 12.2  HCT 35.2*  < > 35.4* 32.7* 35.2*  MCV 92.9  < > 90.8 94.5 94.4  PLT 214  < > 280 231 220  < > = values in this interval not displayed. Lab Results  Component Value Date   TSH 0.92 05/17/2017   No results found for: HGBA1C No results found for: CHOL, HDL, LDLCALC, LDLDIRECT, TRIG, CHOLHDL  Significant Diagnostic Results in last 30 days:  No results found.  Assessment/Plan Problem List Items Addressed This Visit    Paranoid psychosis (HCC) (Chronic)    Stabilized, continue Risperdal.       Lower extremity edema (Chronic)    Trace in BLE      Hypothyroidism (Chronic)    04/05/17 Na 136, K 4.0, Bun 12, creat 0.62, Hgb 12.2, TSH 0.333 Current Levothyroxine qd Update TSH      Hyponatremia (Chronic)    Normalized, last Na 136 04/05/17      HTN (hypertension) (Chronic)     Controlled, continue Metoprolol 25mg  bid, Lisinopril 5mg  qd      Chronic kidney disease (Chronic)    Last Na 136, K 4.0, Bun 12, creat 0.62 04/05/17      Chronic diastolic congestive heart failure (HCC) (Chronic)    Compensated clinically, not taking diuretics.       Atrial fibrillation (HCC) (Chronic)    Heart rate is in control. off  Amiodarone 200mg  daily. Continue Eliquis 2.5mg  bid for thromboembolism risk reduction. Metoprolol 25mg  bid. 04/05/17 Na 136, K 4.0, Bun 12, creat 0.62, Hgb 12.2, TSH 0.333          Family/ staff Communication: AL   Labs/tests ordered:  TSH

## 2017-05-15 NOTE — Assessment & Plan Note (Signed)
Heart rate is in control. off Amiodarone 200mg  daily. Continue Eliquis 2.5mg  bid for thromboembolism risk reduction. Metoprolol 25mg  bid. 04/05/17 Na 136, K 4.0, Bun 12, creat 0.62, Hgb 12.2, TSH 0.333

## 2017-05-15 NOTE — Progress Notes (Signed)
Location:  [facility] Room Number: 38 Place of Service:  SNF ([phone]) Provider:  Mast, Manxie  NP  Kimber Relic, MD  Patient Care Team: Kimber Relic, MD as PCP - General (Internal Medicine) Duke Salvia, MD as Consulting Physician (Cardiology) Corky Crafts, MD as Consulting Physician (Interventional Cardiology) Sheral Apley, MD as Attending Physician (Orthopedic Surgery)  Extended Emergency Contact Information Primary Emergency Contact: McNeill,Dr. Robin Searing [place] Home Phone: [phone] Work Phone: [phone] Mobile Phone: [phone] Relation: Daughter  Code Status:  DNR Goals of care: Advanced Directive information Advanced Directives 05/15/2017  Does Patient Have a Medical Advance Directive? Yes  Type of Estate agent of [place];Living will;Out of facility DNR (pink MOST or yellow form)  Does patient want to make changes to medical advance directive? No - Patient declined  Copy of Healthcare Power of Attorney in Chart? Yes  Would patient like information on creating a medical advance directive? -  Pre-existing out of facility DNR order (yellow form or pink MOST form) Yellow form placed in chart (order not valid for inpatient use)     Chief Complaint  Patient presents with  . Medical Management of Chronic Issues    HPI:  Pt is a 83 y.o. female seen today for medical management of chronic diseases.     Past Medical History:  Diagnosis Date  . Acute encephalopathy 03/24/2017  . Acute on chronic diastolic heart failure (HCC)   . AICD (automatic cardioverter/defibrillator) present   . Arthritis    "plenty" (06/17/2015)  . Atrial fibrillation (HCC)   . CHF (congestive heart failure) (HCC)   . Chronic back pain   . Chronic kidney disease    02/17/16 Bun 34, creat 1.05  . Complication of anesthesia    "30 minutes violent shaking after a dental procedure"   . Degenerative disc disease,  lumbar    . Diastolic heart failure (HCC)   . Gluten intolerance    . Heart murmur   . Hypertension   . Hypothyroidism    "from the aminodarone"  . Idiopathic scoliosis    . Osteoporosis   . Paroxysmal atrial fibrillation (HCC)   . Pleural effusion 2011   "hugh w/pneumonia"  . Pneumonia 2011; 11/2014  . Sinus bradycardia   . Stroke [facility])    TIA  . Thyroid disease   . TIA (transient ischemic attack) 2000's X 1  . Trigeminal neuralgia of left side of face 12/13/2014  . Unstable gait     Past Surgical History:  Procedure Laterality Date  . BI-VENTRICULAR IMPLANTABLE CARDIOVERTER DEFIBRILLATOR  (CRT-D)  06/17/2015  . CATARACT EXTRACTION W/ INTRAOCULAR LENS  IMPLANT, BILATERAL Bilateral 1991-1992  . EP IMPLANTABLE DEVICE N/A 06/17/2015   Procedure: Pacemaker Implant;  Surgeon: Duke Salvia, MD;  Location: [facility] INVASIVE CV LAB;  Service: Cardiovascular;  Laterality: N/A;  . LOOP RECORDER IMPLANT  08/2013   explanted 06/17/2015  . PACEMAKER INSERTION    . TOENAIL AVULSION Left    2nd digit  . TONSILLECTOMY  1944    Allergies  Allergen Reactions  . Codeine Other (See Comments)    unknown  . Codeine Nausea And Vomiting    & headaches  . Levaquin [Levofloxacin In D5w] Itching  . Levaquin [Levofloxacin In D5w] Swelling and Other (See Comments)    & redness  . Loratadine Other (See Comments)    TACHYCARDIA  . Xylocaine [Lidocaine Hcl] Other (See Comments)  Uncontrolled shaking  . Xylocaine [Lidocaine Hcl] Other (See Comments)    Made her very cold & lots of shaking. Pt and daughter stated that she can take it.  . Loratadine Palpitations    Outpatient Encounter Prescriptions as of 05/15/2017  Medication Sig  . acetaminophen (TYLENOL) 325 MG tablet Take 650 mg by mouth. Take two tablets every 6 hours as needed for pain/fever  . apixaban (ELIQUIS) 2.5 MG TABS tablet Take 1 tablet (2.5 mg total) by mouth 2 (two) times daily.  . feeding supplement (BOOST / RESOURCE BREEZE) LIQD  Take 90 mLs by mouth 3 (three) times daily between meals.   . fexofenadine (ALLEGRA) 180 MG tablet Take 180 mg by mouth daily.  Marland Kitchen. levothyroxine (SYNTHROID, LEVOTHROID) 75 MCG tablet Take 1 tablet (75 mcg total) by mouth daily before breakfast. Reported on 02/04/2016  . lisinopril (PRINIVIL,ZESTRIL) 5 MG tablet Take 1 tablet (5 mg total) by mouth daily.  . magnesium hydroxide (MILK OF MAGNESIA) 400 MG/5ML suspension Take 5 mLs by mouth daily as needed for mild constipation.  . metoprolol tartrate (LOPRESSOR) 25 MG tablet Take 1 tablet (25 mg total) by mouth 2 (two) times daily.  . risperiDONE (RISPERDAL) 0.25 MG tablet Take one tablet in the morning and again at about 4:30 PM to treat paranoia and agitation   No facility-administered encounter medications on file as of 05/15/2017.     Review of Systems  Immunization History  Administered Date(s) Administered  . Influenza-Unspecified 10/05/2016  . PPD Test 01/11/2016  . Td 03/05/2014   Pertinent  Health Maintenance Due  Topic Date Due  . DEXA SCAN  01/18/1986  . PNA vac Low Risk Adult (1 of 2 - PCV13) 01/18/1986  . INFLUENZA VACCINE  07/25/2017   Fall Risk  02/18/2016 04/24/2015  Falls in the past year? No Yes  Number falls in past yr: - 2 or more  Injury with Fall? - Yes  Risk Factor Category  - High Fall Risk  Risk for fall due to : - History of fall(s);Impaired balance/gait  Follow up - Falls evaluation completed   Functional Status Survey:    Vitals:   05/15/17 1203  BP: 124/88  Pulse: 76  Resp: 20  Temp: 98 F (36.7 C)  SpO2: 100%  Weight: 94 lb 4.8 oz (42.8 kg)  Height: 4\' 7"  (1.397 m)   Body mass index is 21.92 kg/m. Physical Exam  Labs reviewed:  Recent Labs  03/24/17 1429  04/02/17 2027 04/04/17 0334 04/05/17 0415 04/05/17 1159  NA  --   < > 133* 134* 136  --   K  --   < > 4.6 4.5 5.3* 4.0  CL  --   < > 102 105 107  --   CO2  --   < > 24 23 23   --   GLUCOSE  --   < > 107* 84 67  --   BUN  --   < >  14 12 12   --   CREATININE  --   < > 0.65 0.61 0.62  --   CALCIUM  --   < > 8.9 8.1* 8.3*  --   MG 1.9  --   --   --   --   --   PHOS 3.6  --   --   --   --   --   < > = values in this interval not displayed.  Recent Labs  12/03/16 1053 03/24/17 1138 04/02/17 2027  AST 37 30 27  ALT 21 16 20   ALKPHOS 91 64 68  BILITOT 0.4 0.6 0.7  PROT 5.9* 5.7* 6.2*  ALBUMIN 3.4* 3.4* 3.8    Recent Labs  12/03/16 1053  04/02/17 2027 04/04/17 0334 04/05/17 0415  WBC 8.5  < > 8.2 6.9 9.5  NEUTROABS 6.3  --   --   --   --   HGB 12.0  < > 12.4 11.2* 12.2  HCT 35.2*  < > 35.4* 32.7* 35.2*  MCV 92.9  < > 90.8 94.5 94.4  PLT 214  < > 280 231 220  < > = values in this interval not displayed. Lab Results  Component Value Date   TSH 0.333 (L) 03/24/2017   No results found for: HGBA1C No results found for: CHOL, HDL, LDLCALC, LDLDIRECT, TRIG, CHOLHDL  Significant Diagnostic Results in last 30 days:  No results found.  Assessment/Plan 1. Paroxysmal atrial fibrillation (HCC)   2. Other specified hypothyroidism   3. Chronic diastolic congestive heart failure (HCC)   4. Essential hypertension   5. Stage 2 chronic kidney disease   6. Lower extremity edema   7. Paranoid psychosis (HCC)   8. Hyponatremia     Family/ staff Communication:   Labs/tests ordered:

## 2017-05-15 NOTE — Assessment & Plan Note (Signed)
Last Na 136, K 4.0, Bun 12, creat 0.62 04/05/17

## 2017-05-15 NOTE — Assessment & Plan Note (Signed)
Trace in BLE 

## 2017-05-15 NOTE — Assessment & Plan Note (Signed)
04/05/17 Na 136, K 4.0, Bun 12, creat 0.62, Hgb 12.2, TSH 0.333 Current Levothyroxine 75mcg qd Update TSH

## 2017-05-15 NOTE — Assessment & Plan Note (Signed)
Normalized, last Na 136 04/05/17

## 2017-05-15 NOTE — Assessment & Plan Note (Signed)
Controlled, continue Metoprolol 25mg  bid, Lisinopril 5mg  qd

## 2017-05-15 NOTE — Assessment & Plan Note (Signed)
Stabilized, continue Risperdal.

## 2017-05-15 NOTE — Assessment & Plan Note (Signed)
Compensated clinically, not taking diuretics.  

## 2017-05-17 LAB — TSH: TSH: 0.92 u[IU]/mL (ref 0.41–5.90)

## 2017-07-02 ENCOUNTER — Encounter: Payer: Self-pay | Admitting: *Deleted

## 2017-07-02 DIAGNOSIS — F419 Anxiety disorder, unspecified: Secondary | ICD-10-CM | POA: Insufficient documentation

## 2017-07-02 DIAGNOSIS — M810 Age-related osteoporosis without current pathological fracture: Secondary | ICD-10-CM | POA: Insufficient documentation

## 2017-07-02 DIAGNOSIS — T563X1A Toxic effect of cadmium and its compounds, accidental (unintentional), initial encounter: Secondary | ICD-10-CM | POA: Insufficient documentation

## 2017-07-06 LAB — HEPATIC FUNCTION PANEL
ALT: 17 (ref 7–35)
AST: 22 (ref 13–35)
Alkaline Phosphatase: 68 (ref 25–125)
BILIRUBIN, TOTAL: 0.5

## 2017-07-06 LAB — BASIC METABOLIC PANEL
CREATININE: 0.7 (ref 0.5–1.1)
Glucose: 85
Potassium: 4.5 (ref 3.4–5.3)
Sodium: 134 — AB (ref 137–147)

## 2017-07-06 LAB — TSH: TSH: 1 (ref 0.41–5.90)

## 2017-07-06 LAB — CBC AND DIFFERENTIAL
HEMATOCRIT: 29 — AB (ref 36–46)
HEMOGLOBIN: 9.9 — AB (ref 12.0–16.0)
PLATELETS: 246 (ref 150–399)
WBC: 6.5

## 2017-07-25 ENCOUNTER — Encounter: Payer: Self-pay | Admitting: Internal Medicine

## 2017-07-25 ENCOUNTER — Non-Acute Institutional Stay: Payer: Medicare PPO | Admitting: Internal Medicine

## 2017-07-25 DIAGNOSIS — R600 Localized edema: Secondary | ICD-10-CM

## 2017-07-25 DIAGNOSIS — R400 Somnolence: Secondary | ICD-10-CM

## 2017-07-25 DIAGNOSIS — D649 Anemia, unspecified: Secondary | ICD-10-CM

## 2017-07-25 NOTE — Progress Notes (Signed)
[facility]   Provider: Oneal GroutMahima Ghina Bittinger MD   Location:  [facility] Room Number: 10138 Place of Service:  ALF ([phone])  PCP: Oneal GroutPandey, Shade Rivenbark, MD Patient Care Team: Oneal GroutPandey, Wardell Pokorski, MD as PCP - General (Internal Medicine) Duke SalviaKlein, Steven C, MD as Consulting Physician (Cardiology) Corky CraftsVaranasi, Jayadeep S, MD as Consulting Physician (Interventional Cardiology) Sheral ApleyMurphy, Timothy D, MD as Attending Physician (Orthopedic Surgery)  Extended Emergency Contact Information Primary Emergency Contact: McNeill,Dr. Robin SearingWendy  [place] Home Phone: [phone] Work Phone: [phone] Mobile Phone: [phone] Relation: Daughter  Goals of Care: Advanced Directive information Advanced Directives 05/15/2017  Does Patient Have a Medical Advance Directive? Yes  Type of Estate agentAdvance Directive Healthcare Power of Bel[place]orney;Living will;Out of facility DNR (pink MOST or yellow form)  Does patient want to make changes to medical advance directive? No - Patient declined  Copy of Healthcare Power of Attorney in Chart? Yes  Would patient like information on creating a medical advance directive? -  Pre-existing out of facility DNR order (yellow form or pink MOST form) Yellow form placed in chart (order not valid for inpatient use)      Chief Complaint  Patient presents with  . Acute Visit    Increased sedation and family concerns    HPI: Patient is a 83 y.o. female seen today for acute visit. Her daughter has called and is concerned about increased sleepiness and increased leg swelling with patient. Patient is seen in her room today. She does complaint of feeling excessively sleepy all the time. She sleeps well at night. She has to wake up twice at night to urinate. Denies any other concern this visit. Patient has been transitioned to our care from her prior PCP recently. This is my first visit with the patient.  Past Medical History:  Diagnosis Date  . Acute encephalopathy  03/24/2017  . Acute on chronic diastolic heart failure (HCC)   . AICD (automatic cardioverter/defibrillator) present   . Arthritis    "plenty" (06/17/2015)  . Atrial fibrillation (HCC)   . CHF (congestive heart failure) (HCC)   . Chronic back pain   . Chronic kidney disease    02/17/16 Bun 34, creat 1.05  . Complication of anesthesia    "30 minutes violent shaking after a dental procedure"   . Degenerative disc disease, lumbar    . Diastolic heart failure (HCC)   . Gluten intolerance    . Heart murmur   . Hypertension   . Hypothyroidism    "from the aminodarone"  . Idiopathic scoliosis    . Osteoporosis   . Paroxysmal atrial fibrillation (HCC)   . Pleural effusion 2011   "hugh w/pneumonia"  . Pneumonia 2011; 11/2014  . Sinus bradycardia   . Stroke [facility](HCC)    TIA  . Thyroid disease   . TIA (transient ischemic attack) 2000's X 1  . Trigeminal neuralgia of left side of face 12/13/2014  . Unstable gait     Past Surgical History:  Procedure Laterality Date  . BI-VENTRICULAR IMPLANTABLE CARDIOVERTER DEFIBRILLATOR  (CRT-D)  06/17/2015  . CATARACT EXTRACTION W/ INTRAOCULAR LENS  IMPLANT, BILATERAL Bilateral 1991-1992  . EP IMPLANTABLE DEVICE N/A 06/17/2015   Procedure: Pacemaker Implant;  Surgeon: Duke SalviaSteven C Klein, MD;  [facility];  Service: Cardiovascular;  Laterality: N/A;  . LOOP RECORDER IMPLANT  08/2013   explanted 06/17/2015  . PACEMAKER INSERTION    . TOENAIL AVULSION Left    2nd digit  .  TONSILLECTOMY  1944    reports that she has never smoked. She has never used smokeless tobacco. She reports that she does not drink alcohol or use drugs. Social History   Social History  . Marital status: Widowed    Spouse name: N/A  . Number of children: N/A  . Years of education: N/A   Occupational History  . Not on file.   Social History Main Topics  . Smoking status: Never Smoker  . Smokeless tobacco: Never Used  . Alcohol use No  . Drug use: No  . Sexual  activity: No   Other Topics Concern  . Not on file   Social History Narrative   ** Merged History Encounter **         Family History  Problem Relation Age of Onset  . Stroke Mother   . Hypertension Father   . Heart attack Neg Hx     Health Maintenance  Topic Date Due  . DEXA SCAN  01/18/1986  . PNA vac Low Risk Adult (1 of 2 - PCV13) 01/18/1986  . INFLUENZA VACCINE  07/25/2017  . TETANUS/TDAP  03/05/2024    Allergies  Allergen Reactions  . Codeine Other (See Comments)    unknown  . Codeine Nausea And Vomiting    & headaches  . Levaquin [Levofloxacin In D5w] Itching  . Levaquin [Levofloxacin In D5w] Swelling and Other (See Comments)    & redness  . Loratadine Other (See Comments)    TACHYCARDIA  . Xylocaine [Lidocaine Hcl] Other (See Comments)    Uncontrolled shaking  . Xylocaine [Lidocaine Hcl] Other (See Comments)    Made her very cold & lots of shaking. Pt and daughter stated that she can take it.  . Loratadine Palpitations    Outpatient Encounter Prescriptions as of 07/25/2017  Medication Sig  . acetaminophen (TYLENOL) 325 MG tablet Take 650 mg by mouth every 6 (six) hours as needed for mild pain or fever.   Marland Kitchen. apixaban (ELIQUIS) 2.5 MG TABS tablet Take 1 tablet (2.5 mg total) by mouth 2 (two) times daily.  Marland Kitchen. erythromycin ophthalmic ointment Place 1 application into the right eye 2 (two) times daily. Stop date 08/09/17  . feeding supplement (BOOST / RESOURCE BREEZE) LIQD Take by mouth 2 (two) times daily. Give 5 ounces  . fexofenadine (ALLEGRA) 180 MG tablet Take 180 mg by mouth daily as needed.   Marland Kitchen. levothyroxine (SYNTHROID, LEVOTHROID) 75 MCG tablet Take 1 tablet (75 mcg total) by mouth daily before breakfast. Reported on 02/04/2016  . lisinopril (PRINIVIL,ZESTRIL) 5 MG tablet Take 1 tablet (5 mg total) by mouth daily.  . magnesium hydroxide (MILK OF MAGNESIA) 400 MG/5ML suspension Take 30 mLs by mouth as needed for mild constipation.   . metoprolol tartrate  (LOPRESSOR) 25 MG tablet Take 1 tablet (25 mg total) by mouth 2 (two) times daily.  . risperiDONE (RISPERDAL) 0.25 MG tablet Take one tablet in the morning and again at about 4:30 PM to treat paranoia and agitation   No facility-administered encounter medications on file as of 07/25/2017.     Review of Systems  Constitutional: Positive for fatigue. Negative for appetite change, chills, diaphoresis and fever.  HENT: Positive for hearing loss. Negative for congestion, ear pain, mouth sores, nosebleeds, postnasal drip, rhinorrhea, sinus pressure, sore throat and trouble swallowing.   Eyes: Positive for visual disturbance.       Has corrective lenses  Respiratory: Negative for cough and shortness of breath.   Cardiovascular: Negative for  chest pain and palpitations.  Gastrointestinal: Negative for abdominal pain, blood in stool, constipation, diarrhea, nausea and vomiting.  Genitourinary: Negative for dysuria and hematuria.       Has urinary incontinence  Musculoskeletal: Positive for gait problem.  Neurological: Negative for dizziness, syncope and headaches.  Psychiatric/Behavioral: Positive for sleep disturbance. Negative for behavioral problems and confusion. The patient is not nervous/anxious.     Vitals:   07/25/17 1443  BP: 128/70  Pulse: 60  Resp: 16  Temp: (!) 97.4 F (36.3 C)  TempSrc: Oral  SpO2: 98%  Weight: 98 lb 8 oz (44.7 kg)  Height: 4\' 7"  (1.397 m)   Body mass index is 22.89 kg/m. Physical Exam  Constitutional: She is oriented to person, place, and time. She appears well-developed and well-nourished. No distress.  Elderly female patient  HENT:  Head: Normocephalic and atraumatic.  Mouth/Throat: Oropharynx is clear and moist.  Eyes: Pupils are equal, round, and reactive to light. Conjunctivae and EOM are normal.  Neck: Normal range of motion. Neck supple.  Cardiovascular: Normal rate and regular rhythm.   Murmur heard. Systolic murmur +  Pulmonary/Chest: Effort  normal and breath sounds normal. No respiratory distress. She has no wheezes. She has no rales.  Abdominal: Soft. Bowel sounds are normal. There is no tenderness. There is no rebound and no guarding.  Musculoskeletal: She exhibits edema.  Unsteady gait, using walker with brake and seat. Able to move all 4 extremities, has arthritis changes to her shoulders and fingers. Chronic leg edema 1+  Neurological: She is alert and oriented to person, place, and time.  Skin: Skin is warm and dry. She is not diaphoretic.  Psychiatric: She has a normal mood and affect. Her behavior is normal.    Labs reviewed: Basic Metabolic Panel:  Recent Labs  16/10/96 1429  04/02/17 2027 04/04/17 0334 04/05/17 0415 04/05/17 1159 07/06/17  NA  --   < > 133* 134* 136  --  134*  K  --   < > 4.6 4.5 5.3* 4.0 4.5  CL  --   < > 102 105 107  --   --   CO2  --   < > 24 23 23   --   --   GLUCOSE  --   < > 107* 84 67  --   --   BUN  --   < > 14 12 12   --   --   CREATININE  --   < > 0.65 0.61 0.62  --  0.7  CALCIUM  --   < > 8.9 8.1* 8.3*  --   --   MG 1.9  --   --   --   --   --   --   PHOS 3.6  --   --   --   --   --   --   < > = values in this interval not displayed. Liver Function Tests:  Recent Labs  12/03/16 1053 03/24/17 1138 04/02/17 2027 07/06/17  AST 37 30 27 22   ALT 21 16 20 17   ALKPHOS 91 64 68 68  BILITOT 0.4 0.6 0.7  --   PROT 5.9* 5.7* 6.2*  --   ALBUMIN 3.4* 3.4* 3.8  --    No results for input(s): LIPASE, AMYLASE in the last 8760 hours. No results for input(s): AMMONIA in the last 8760 hours. CBC:  Recent Labs  12/03/16 1053  04/02/17 2027 04/04/17 0334 04/05/17 0415 07/06/17  WBC 8.5  < >  8.2 6.9 9.5 6.5  NEUTROABS 6.3  --   --   --   --   --   HGB 12.0  < > 12.4 11.2* 12.2 9.9*  HCT 35.2*  < > 35.4* 32.7* 35.2* 29*  MCV 92.9  < > 90.8 94.5 94.4  --   PLT 214  < > 280 231 220 246  < > = values in this interval not displayed. Cardiac Enzymes:  Recent Labs  12/03/16 1053    TROPONINI <0.03   BNP: Invalid input(s): POCBNP No results found for: HGBA1C Lab Results  Component Value Date   TSH 1.00 07/06/2017   Lab Results  Component Value Date   VITAMINB12 927 (H) 03/24/2017   No results found for: FOLATE No results found for: IRON, TIBC, FERRITIN  Lipid Panel: No results for input(s): CHOL, HDL, LDLCALC, TRIG, CHOLHDL, LDLDIRECT in the last 8760 hours. No results found for: HGBA1C  Procedures since last visit: No results found.  Assessment/Plan  Increased somnolence Lab work from 7/13 shows anemia. This could be making her weak and fatigued and sleepy. She is also on risperidone 0.25 mg bid. Her behavior appears stable. She has not been resisting care. Decrease her risperidone to 0.25 mg qhs only for now and reassess. Check cbc with diff and ferritin level to assess further. Reviewed tsh. Check lytes and renal function as well given her increased sleepiness. Lab Results  Component Value Date   TSH 1.00 07/06/2017   Leg edema Increased per daughter. Has hx of diastolic chf and was on torsemide before. No dyspnea noted. Keep legs elevated at rest and add ted hose for now. If this does not help, consider diuretics. Monitor her weight  Anemia unspecified Low h&h. Likely has anemia of chronic disease. Check ferritin level  Labs/tests ordered:  Cbc, ferritin, cmp  Communication: reviewed care plan with patient, her daughter and charge nurse.   Spent 45 minutes with patient of which more than 50% related to direct patient care.     Oneal Grout, MD Internal Medicine [facility] [address] [place], [place] [address] Cell Phone (Monday-Friday 8 am - 5 pm): [phone] On Call: [phone] and follow prompts after 5 pm and on weekends Office Phone: [phone] Office Fax: [phone]

## 2017-07-26 LAB — CBC AND DIFFERENTIAL
HCT: 31 — AB (ref 36–46)
Hemoglobin: 10.3 — AB (ref 12.0–16.0)
Platelets: 207 (ref 150–399)
WBC: 6.6

## 2017-07-26 LAB — HEPATIC FUNCTION PANEL
ALT: 16 (ref 7–35)
AST: 22 (ref 13–35)
Alkaline Phosphatase: 62 (ref 25–125)
Bilirubin, Total: 0.5

## 2017-07-26 LAB — BASIC METABOLIC PANEL
BUN: 24 — AB (ref 4–21)
CREATININE: 0.7 (ref 0.5–1.1)
Glucose: 80
POTASSIUM: 4.6 (ref 3.4–5.3)
Sodium: 136 — AB (ref 137–147)

## 2017-07-27 ENCOUNTER — Encounter: Payer: Self-pay | Admitting: Internal Medicine

## 2017-07-27 ENCOUNTER — Non-Acute Institutional Stay: Payer: Medicare PPO | Admitting: Internal Medicine

## 2017-07-27 DIAGNOSIS — R300 Dysuria: Secondary | ICD-10-CM

## 2017-07-27 DIAGNOSIS — R2681 Unsteadiness on feet: Secondary | ICD-10-CM

## 2017-07-27 DIAGNOSIS — I350 Nonrheumatic aortic (valve) stenosis: Secondary | ICD-10-CM

## 2017-07-27 DIAGNOSIS — I5032 Chronic diastolic (congestive) heart failure: Secondary | ICD-10-CM

## 2017-07-27 DIAGNOSIS — E441 Mild protein-calorie malnutrition: Secondary | ICD-10-CM | POA: Diagnosis not present

## 2017-07-27 DIAGNOSIS — E039 Hypothyroidism, unspecified: Secondary | ICD-10-CM

## 2017-07-27 DIAGNOSIS — N182 Chronic kidney disease, stage 2 (mild): Secondary | ICD-10-CM

## 2017-07-27 DIAGNOSIS — I480 Paroxysmal atrial fibrillation: Secondary | ICD-10-CM | POA: Diagnosis not present

## 2017-07-27 DIAGNOSIS — J302 Other seasonal allergic rhinitis: Secondary | ICD-10-CM | POA: Diagnosis not present

## 2017-07-27 DIAGNOSIS — M5136 Other intervertebral disc degeneration, lumbar region: Secondary | ICD-10-CM

## 2017-07-27 DIAGNOSIS — R531 Weakness: Secondary | ICD-10-CM

## 2017-07-27 DIAGNOSIS — F22 Delusional disorders: Secondary | ICD-10-CM | POA: Diagnosis not present

## 2017-07-27 DIAGNOSIS — M51369 Other intervertebral disc degeneration, lumbar region without mention of lumbar back pain or lower extremity pain: Secondary | ICD-10-CM

## 2017-07-27 NOTE — Progress Notes (Addendum)
[facility]   Provider: Oneal GroutMahima Braylon Grenda MD   Location:  [facility] Room Number: 5038 Place of Service:  ALF ([phone])  PCP: Oneal GroutPandey, Serina Nichter, MD Patient Care Team: Oneal GroutPandey, Elyanah Farino, MD as PCP - General (Internal Medicine) Duke SalviaKlein, Steven C, MD as Consulting Physician (Cardiology) Corky CraftsVaranasi, Jayadeep S, MD as Consulting Physician (Interventional Cardiology) Sheral ApleyMurphy, Timothy D, MD as Attending Physician (Orthopedic Surgery)  Extended Emergency Contact Information Primary Emergency Contact: McNeill,Dr. Robin SearingWendy  [place] Home Phone: [phone] Work Phone: [phone] Mobile Phone: [phone] Relation: Daughter  Goals of Care: Advanced Directive information Advanced Directives 08/02/2017  Does Patient Have a Medical Advance Directive? Yes  Type of Advance Directive Out of facility DNR (pink MOST or yellow form);Living will;Healthcare Power of Attorney  Does patient want to make changes to medical advance directive? -  Copy of Healthcare Power of Attorney in Chart? Yes  Would patient like information on creating a medical advance directive? -  Pre-existing out of facility DNR order (yellow form or pink MOST form) Yellow form placed in chart (order not valid for inpatient use);Pink MOST form placed in chart (order not valid for inpatient use)      Chief Complaint  Patient presents with  . Medical Management of Chronic Issues    HPI: Patient is a 83 y.o. female seen today for visit for medical management of chronic issues. She resides in assisted living. She is alert and oriented. She has medical history of afib, diastolic CHF, aortic stenosis, chronic back pain among others. She has been sleepy during daytime and has required increased assistance with her ADLs per pt, family and nursing. She takes her medications. She was taking risperdal 0.25 mg bid for psychosis which was decreased 2 days back to once a day with concern for it contributing to  her sleepiness. She is seen in her room today with her daughter at bedside. No prior records from PCP office available for review.    Past Medical History:  Diagnosis Date  . Acute encephalopathy 03/24/2017  . Acute on chronic diastolic heart failure (HCC)   . AICD (automatic cardioverter/defibrillator) present   . Arthritis    "plenty" (06/17/2015)  . Atrial fibrillation (HCC)   . CHF (congestive heart failure) (HCC)   . Chronic back pain   . Chronic kidney disease    02/17/16 Bun 34, creat 1.05  . Complication of anesthesia    "30 minutes violent shaking after a dental procedure"   . Degenerative disc disease, lumbar    . Diastolic heart failure (HCC)   . Gluten intolerance    . Heart murmur   . Hypertension   . Hypothyroidism    "from the aminodarone"  . Idiopathic scoliosis    . Osteoporosis   . Paroxysmal atrial fibrillation (HCC)   . Pleural effusion 2011   "hugh w/pneumonia"  . Pneumonia 2011; 11/2014  . Sinus bradycardia   . Stroke [facility](HCC)    TIA  . Thyroid disease   . TIA (transient ischemic attack) 2000's X 1  . Trigeminal neuralgia of left side of face 12/13/2014  . Unstable gait     Past Surgical History:  Procedure Laterality Date  . BI-VENTRICULAR IMPLANTABLE CARDIOVERTER DEFIBRILLATOR  (CRT-D)  06/17/2015  . CATARACT EXTRACTION W/ INTRAOCULAR LENS  IMPLANT, BILATERAL Bilateral 1991-1992  . EP IMPLANTABLE DEVICE N/A 06/17/2015   Procedure: Pacemaker Implant;  Surgeon: Duke SalviaSteven C Klein, MD;  [facility];  Service:  Cardiovascular;  Laterality: N/A;  . LOOP RECORDER IMPLANT  08/2013   explanted 06/17/2015  . PACEMAKER INSERTION    . TOENAIL AVULSION Left    2nd digit  . TONSILLECTOMY  1944    reports that she has never smoked. She has never used smokeless tobacco. She reports that she does not drink alcohol or use drugs. Social History   Social History  . Marital status: Widowed    Spouse name: N/A  . Number of children: N/A  . Years of  education: N/A   Occupational History  . Not on file.   Social History Main Topics  . Smoking status: Never Smoker  . Smokeless tobacco: Never Used  . Alcohol use No  . Drug use: No  . Sexual activity: No   Other Topics Concern  . Not on file   Social History Narrative   ** Merged History Encounter **         Family History  Problem Relation Age of Onset  . Stroke Mother   . Hypertension Father   . Heart attack Neg Hx     Health Maintenance  Topic Date Due  . DEXA SCAN  01/18/1986  . PNA vac Low Risk Adult (1 of 2 - PCV13) 01/18/1986  . INFLUENZA VACCINE  07/25/2017  . TETANUS/TDAP  03/05/2024    Allergies  Allergen Reactions  . Codeine Other (See Comments)    unknown  . Codeine Nausea And Vomiting    & headaches  . Levaquin [Levofloxacin In D5w] Itching  . Levaquin [Levofloxacin In D5w] Swelling and Other (See Comments)    & redness  . Loratadine Other (See Comments)    TACHYCARDIA  . Xylocaine [Lidocaine Hcl] Other (See Comments)    Uncontrolled shaking  . Xylocaine [Lidocaine Hcl] Other (See Comments)    Made her very cold & lots of shaking. Pt and daughter stated that she can take it.  . Loratadine Palpitations    Outpatient Encounter Prescriptions as of 07/27/2017  Medication Sig  . acetaminophen (TYLENOL) 325 MG tablet Take 650 mg by mouth every 6 (six) hours as needed for mild pain or fever.   Marland Kitchen apixaban (ELIQUIS) 2.5 MG TABS tablet Take 1 tablet (2.5 mg total) by mouth 2 (two) times daily.  Marland Kitchen erythromycin ophthalmic ointment Place 1 application into the right eye 2 (two) times daily. Stop date 08/09/17  . feeding supplement (BOOST / RESOURCE BREEZE) LIQD Take by mouth 2 (two) times daily. Give 5 ounces  . fexofenadine (ALLEGRA) 180 MG tablet Take 180 mg by mouth daily as needed.   Marland Kitchen levothyroxine (SYNTHROID, LEVOTHROID) 75 MCG tablet Take 1 tablet (75 mcg total) by mouth daily before breakfast. Reported on 02/04/2016  . lisinopril (PRINIVIL,ZESTRIL) 5  MG tablet Take 1 tablet (5 mg total) by mouth daily.  . magnesium hydroxide (MILK OF MAGNESIA) 400 MG/5ML suspension Take 30 mLs by mouth as needed for mild constipation.   . metoprolol tartrate (LOPRESSOR) 25 MG tablet Take 1 tablet (25 mg total) by mouth 2 (two) times daily.  . risperiDONE (RISPERDAL) 0.25 MG tablet Take 0.25 mg by mouth at bedtime.  . [DISCONTINUED] risperiDONE (RISPERDAL) 0.25 MG tablet Take one tablet in the morning and again at about 4:30 PM to treat paranoia and agitation   No facility-administered encounter medications on file as of 07/27/2017.     Review of Systems  Constitutional: Positive for activity change and fatigue. Negative for appetite change, chills, diaphoresis, fever and unexpected weight change.  HENT: Positive for hearing loss and rhinorrhea. Negative for congestion, drooling, ear discharge, ear pain, facial swelling, mouth sores, nosebleeds, postnasal drip, sinus pain, sinus pressure, sneezing, sore throat, tinnitus and trouble swallowing.        Has hearing aid, has allergic rhinitis  Eyes: Positive for visual disturbance. Negative for pain and itching.       Has corrective lenses  Respiratory: Negative for cough, choking, chest tightness, shortness of breath and wheezing.   Cardiovascular: Positive for leg swelling. Negative for chest pain and palpitations.  Gastrointestinal: Negative for abdominal pain, blood in stool, constipation, diarrhea, nausea and vomiting.       Prunes help with her bowel movement  Genitourinary: Positive for dysuria and frequency. Negative for flank pain, hematuria and pelvic pain.       Has urinary incontinence  Musculoskeletal: Positive for back pain and gait problem.       Chronic low back pain  Skin: Negative for pallor, rash and wound.  Neurological: Positive for weakness. Negative for dizziness, tremors, seizures, syncope, light-headedness, numbness and headaches.  Psychiatric/Behavioral: Positive for sleep disturbance.  Negative for behavioral problems, confusion and dysphoric mood. The patient is not nervous/anxious.     Vitals:   07/27/17 1150  BP: 122/60  Pulse: 64  Resp: 16  Temp: (!) 97.3 F (36.3 C)  TempSrc: Oral  SpO2: 94%  Weight: 98 lb 8 oz (44.7 kg)  Height: 4\' 7"  (1.397 m)   Body mass index is 22.89 kg/m.   Wt Readings from Last 3 Encounters:  08/02/17 98 lb 8 oz (44.7 kg)  07/27/17 98 lb 8 oz (44.7 kg)  07/25/17 98 lb 8 oz (44.7 kg)   Physical Exam  Constitutional: She is oriented to person, place, and time. She appears well-developed and well-nourished. No distress.  Elderly female patient  HENT:  Head: Normocephalic and atraumatic.  Right Ear: External ear normal.  Left Ear: External ear normal.  Mouth/Throat: Oropharynx is clear and moist.  Has hearing aids. Some cerumen to both ears  Eyes: Pupils are equal, round, and reactive to light. Conjunctivae and EOM are normal. Right eye exhibits no discharge. Left eye exhibits no discharge.  Has corrective lenses  Neck: Normal range of motion. Neck supple. No JVD present. No thyromegaly present.  Cardiovascular: Normal rate and regular rhythm.   Murmur heard. Systolic murmur +  Pulmonary/Chest: Effort normal and breath sounds normal. No respiratory distress. She has no wheezes. She has no rales. She exhibits no tenderness.  Abdominal: Soft. Bowel sounds are normal. She exhibits no distension and no mass. There is no tenderness. There is no rebound and no guarding.  Musculoskeletal: She exhibits edema.  Good dorsalis pedis, Unsteady gait, using walker with brake and seat. Able to move all 4 extremities, has arthritis changes to her shoulders and fingers. Chronic leg edema 1+. Takes small steps  Lymphadenopathy:    She has no cervical adenopathy.  Neurological: She is alert and oriented to person, place, and time.  Skin: Skin is warm and dry. No rash noted. She is not diaphoretic. No erythema. No pallor.  Psychiatric: She has a  normal mood and affect. Her behavior is normal.  Needs redirection at times    Labs reviewed: Basic Metabolic Panel:  Recent Labs  40/98/1103/31/18 1429  04/02/17 2027 04/04/17 0334 04/05/17 0415 04/05/17 1159 07/06/17 07/26/17  NA  --   < > 133* 134* 136  --  134* 136*  K  --   < >  4.6 4.5 5.3* 4.0 4.5 4.6  CL  --   < > 102 105 107  --   --   --   CO2  --   < > 24 23 23   --   --   --   GLUCOSE  --   < > 107* 84 67  --   --   --   BUN  --   < > 14 12 12   --   --  24*  CREATININE  --   < > 0.65 0.61 0.62  --  0.7 0.7  CALCIUM  --   < > 8.9 8.1* 8.3*  --   --   --   MG 1.9  --   --   --   --   --   --   --   PHOS 3.6  --   --   --   --   --   --   --   < > = values in this interval not displayed. Liver Function Tests:  Recent Labs  12/03/16 1053 03/24/17 1138 04/02/17 2027 07/06/17 07/26/17  AST 37 30 27 22 22   ALT 21 16 20 17 16   ALKPHOS 91 64 68 68 62  BILITOT 0.4 0.6 0.7  --   --   PROT 5.9* 5.7* 6.2*  --   --   ALBUMIN 3.4* 3.4* 3.8  --   --    No results for input(s): LIPASE, AMYLASE in the last 8760 hours. No results for input(s): AMMONIA in the last 8760 hours. CBC:  Recent Labs  12/03/16 1053  04/02/17 2027 04/04/17 0334 04/05/17 0415 07/06/17 07/26/17  WBC 8.5  < > 8.2 6.9 9.5 6.5 6.6  NEUTROABS 6.3  --   --   --   --   --   --   HGB 12.0  < > 12.4 11.2* 12.2 9.9* 10.3*  HCT 35.2*  < > 35.4* 32.7* 35.2* 29* 31*  MCV 92.9  < > 90.8 94.5 94.4  --   --   PLT 214  < > 280 231 220 246 207  < > = values in this interval not displayed. Cardiac Enzymes:  Recent Labs  12/03/16 1053  TROPONINI <0.03   BNP: Invalid input(s): POCBNP No results found for: HGBA1C Lab Results  Component Value Date   TSH 1.00 07/06/2017   Lab Results  Component Value Date   VITAMINB12 927 (H) 03/24/2017   No results found for: FOLATE No results found for: IRON, TIBC, FERRITIN  Lipid Panel: No results for input(s): CHOL, HDL, LDLCALC, TRIG, CHOLHDL, LDLDIRECT in the last  8760 hours. No results found for: HGBA1C  Procedures since last visit: No results found.  Assessment/Plan  Generalized weakness Recent lab not suggestive of infection. Send urine for study with her new urinary symptom. Likely multifactorial from her risperidone, possible UTI, AS, chronic chf, deconditioning with her age among others. Will have patient work with PT/OT as tolerated to regain strength and restore function.  Fall precautions are in place.  Dysuria With increased frequency. Maintain hydration for now. Send u/a with c/s. Hold off on antibiotics until f/u of culture report  Acquired hypothyroidism Lab Results  Component Value Date   TSH 1.00 07/06/2017   Stable thyroid function. Continue levothyroxine 75 mcg daily  Chronic diastolic chf Has leg edema. Daughter wants to hold off on torsemide for now. Continue lisinopril and metoprolol tartrate current regimen. Monitor weight. Has upcoming cardiology follow  up  Chronic afib Continue apixaban for stroke prophylaxis. Continue metoprolol tartrate for rate control  Aortic stenosis Murmur present. Possible progression of AS contributing to her weakness as well  Paranoid psychosis Stable at present. Attempted GDR last visit with increased sleepiness. Continue risperdal 0.25 mg daily for now and monitor  Protein calorie malnutrition Body mass index is 22.89 kg/m.  Low but stable weight. Continue her feeding supplement  Chronic low back pain Controlled with acetaminophen 650 mg q6h prn   Allergic rhinitis Continue fexofenadine as needed  ckd stage 2 Reviewed bmp  Unsteady gait Using her walker for transfer. Fall prevention. Will have patient work with PT/OT as tolerated to regain strength and restore function.  Fall precautions are in place.    Communication: reviewed care plan with patient, her daughter and charge nurse.   Spent more than 60 minutes with patient this visit of which more than 50% related to  direct patient care.     Oneal Grout, MD Internal Medicine [facility] [address] [place], [place] [address] Cell Phone (Monday-Friday 8 am - 5 pm): [phone] On Call: [phone] and follow prompts after 5 pm and on weekends Office Phone: [phone] Office Fax: [phone]

## 2017-08-02 ENCOUNTER — Encounter: Payer: Self-pay | Admitting: Family

## 2017-08-02 ENCOUNTER — Non-Acute Institutional Stay: Payer: Medicare PPO | Admitting: Family

## 2017-08-02 DIAGNOSIS — R531 Weakness: Secondary | ICD-10-CM | POA: Diagnosis not present

## 2017-08-02 DIAGNOSIS — F22 Delusional disorders: Secondary | ICD-10-CM

## 2017-08-02 NOTE — Progress Notes (Signed)
Location:  [facility] Room Number: 61 Place of Service:  ALF [phone]) Provider: Ernestina Joe FNP-C  Oneal Grout, MD  Patient Care Team: Oneal Grout, MD as PCP - General (Internal Medicine) Duke Salvia, MD as Consulting Physician (Cardiology) Corky Crafts, MD as Consulting Physician (Interventional Cardiology) Sheral Apley, MD as Attending Physician (Orthopedic Surgery)  Extended Emergency Contact Information Primary Emergency Contact: McNeill,Dr. Robin Searing [place] Home Phone: [phone] Work Phone: [phone] Mobile Phone: [phone] Relation: Daughter  Code Status: DNR Goals of care: Advanced Directive information Advanced Directives 08/02/2017  Does Patient Have a Medical Advance Directive? Yes  Type of Advance Directive Out of facility DNR (pink MOST or yellow form);Living will;Healthcare Power of Attorney  Does patient want to make changes to medical advance directive? -  Copy of Healthcare Power of Attorney in Chart? Yes  Would patient like information on creating a medical advance directive? -  Pre-existing out of facility DNR order (yellow form or pink MOST form) Yellow form placed in chart (order not valid for inpatient use);Pink MOST form placed in chart (order not valid for inpatient use)     Chief Complaint  Patient presents with  . Acute Visit    follow up    HPI:  Pt is a 83 y.o. female seen today at [facility] for an acute visit for follow up generalized weakness.She is seen in her room today sitting on her recliner. She denies any acute issues this visit.She continues to work with therapy. She had one episode one day ago unable to awake/ talk to staff though when patient's POA came immediatly to seen patient.Hearing aid was not in place; she was able to communicate with daughter and staff.Facility Nurse states patient continues to require assistance with ADL's.     Past Medical History:    Diagnosis Date  . Acute encephalopathy 03/24/2017  . Acute on chronic diastolic heart failure (HCC)   . AICD (automatic cardioverter/defibrillator) present   . Arthritis    "plenty" (06/17/2015)  . Atrial fibrillation (HCC)   . CHF (congestive heart failure) (HCC)   . Chronic back pain   . Chronic kidney disease    02/17/16 Bun 34, creat 1.05  . Complication of anesthesia    "30 minutes violent shaking after a dental procedure"   . Degenerative disc disease, lumbar    . Diastolic heart failure (HCC)   . Gluten intolerance    . Heart murmur   . Hypertension   . Hypothyroidism    "from the aminodarone"  . Idiopathic scoliosis    . Osteoporosis   . Paroxysmal atrial fibrillation (HCC)   . Pleural effusion 2011   "hugh w/pneumonia"  . Pneumonia 2011; 11/2014  . Sinus bradycardia   . Stroke [facility])    TIA  . Thyroid disease   . TIA (transient ischemic attack) 2000's X 1  . Trigeminal neuralgia of left side of face 12/13/2014  . Unstable gait     Past Surgical History:  Procedure Laterality Date  . BI-VENTRICULAR IMPLANTABLE CARDIOVERTER DEFIBRILLATOR  (CRT-D)  06/17/2015  . CATARACT EXTRACTION W/ INTRAOCULAR LENS  IMPLANT, BILATERAL Bilateral 1991-1992  . EP IMPLANTABLE DEVICE N/A 06/17/2015   Procedure: Pacemaker Implant;  Surgeon: Duke Salvia, MD;  Location: [facility] INVASIVE CV LAB;  Service: Cardiovascular;  Laterality: N/A;  . LOOP RECORDER IMPLANT  08/2013   explanted 06/17/2015  . PACEMAKER INSERTION    . TOENAIL AVULSION Left  2nd digit  . TONSILLECTOMY  1944    Allergies  Allergen Reactions  . Codeine Other (See Comments)    unknown  . Codeine Nausea And Vomiting    & headaches  . Levaquin [Levofloxacin In D5w] Itching  . Levaquin [Levofloxacin In D5w] Swelling and Other (See Comments)    & redness  . Loratadine Other (See Comments)    TACHYCARDIA  . Xylocaine [Lidocaine Hcl] Other (See Comments)    Uncontrolled shaking  . Xylocaine [Lidocaine Hcl] Other (See  Comments)    Made her very cold & lots of shaking. Pt and daughter stated that she can take it.  . Loratadine Palpitations    Allergies as of 08/02/2017      Reactions   Codeine Other (See Comments)   unknown   Codeine Nausea And Vomiting   & headaches   Levaquin [levofloxacin In D5w] Itching   Levaquin [levofloxacin In D5w] Swelling, Other (See Comments)   & redness   Loratadine Other (See Comments)   TACHYCARDIA   Xylocaine [lidocaine Hcl] Other (See Comments)   Uncontrolled shaking   Xylocaine [lidocaine Hcl] Other (See Comments)   Made her very cold & lots of shaking. Pt and daughter stated that she can take it.   Loratadine Palpitations      Medication List       Accurate as of 08/02/17  4:32 PM. Always use your most recent med list.          acetaminophen 325 MG tablet Commonly known as:  TYLENOL Take 650 mg by mouth every 6 (six) hours as needed for mild pain or fever.   AMERIGEL CARE Lotn Apply 1 application topically as needed.   apixaban 2.5 MG Tabs tablet Commonly known as:  ELIQUIS Take 1 tablet (2.5 mg total) by mouth 2 (two) times daily.   erythromycin ophthalmic ointment Place 1 application into the right eye 2 (two) times daily. Stop date 08/09/17   feeding supplement Liqd Take by mouth 2 (two) times daily. Give 5 ounces   fexofenadine 180 MG tablet Commonly known as:  ALLEGRA Take 180 mg by mouth daily as needed.   levothyroxine 75 MCG tablet Commonly known as:  SYNTHROID, LEVOTHROID Take 1 tablet (75 mcg total) by mouth daily before breakfast. Reported on 02/04/2016   lisinopril 5 MG tablet Commonly known as:  PRINIVIL,ZESTRIL Take 1 tablet (5 mg total) by mouth daily.   magnesium hydroxide 400 MG/5ML suspension Commonly known as:  MILK OF MAGNESIA Take 30 mLs by mouth as needed for mild constipation.   metoprolol tartrate 25 MG tablet Commonly known as:  LOPRESSOR Take 1 tablet (25 mg total) by mouth 2 (two) times daily.   risperiDONE  0.25 MG tablet Commonly known as:  RISPERDAL Take 0.25 mg by mouth at bedtime.       Review of Systems  Constitutional: Negative for activity change, appetite change, chills, fatigue and fever.  HENT: Positive for hearing loss. Negative for congestion, rhinorrhea, sinus pain, sinus pressure, sneezing and sore throat.   Eyes: Negative.   Respiratory: Negative for cough, chest tightness, shortness of breath and wheezing.   Cardiovascular: Positive for leg swelling. Negative for chest pain and palpitations.  Gastrointestinal: Negative for abdominal distention, abdominal pain, constipation, diarrhea, nausea and vomiting.  Genitourinary: Negative for dysuria, flank pain, frequency and urgency.  Musculoskeletal: Positive for arthralgias and gait problem.  Skin: Negative for color change, pallor and rash.  Neurological: Negative for dizziness, seizures, light-headedness and headaches.  Psychiatric/Behavioral: Negative for agitation, confusion, hallucinations and sleep disturbance. The patient is not nervous/anxious.     Immunization History  Administered Date(s) Administered  . Influenza-Unspecified 10/08/2014, 09/23/2015, 10/05/2016  . PPD Test 06/22/2014, 06/11/2015, 01/11/2016  . Td 03/05/2014   Pertinent  Health Maintenance Due  Topic Date Due  . DEXA SCAN  01/18/1986  . PNA vac Low Risk Adult (1 of 2 - PCV13) 01/18/1986  . INFLUENZA VACCINE  07/25/2017   Fall Risk  02/18/2016 04/24/2015  Falls in the past year? No Yes  Number falls in past yr: - 2 or more  Injury with Fall? - Yes  Risk Factor Category  - High Fall Risk  Risk for fall due to : - History of fall(s);Impaired balance/gait  Follow up - Falls evaluation completed    Vitals:   08/02/17 1003  BP: 130/68  Pulse: 62  Resp: 16  Temp: 98.4 F (36.9 C)  SpO2: 94%  Weight: 98 lb 8 oz (44.7 kg)  Height: 4\' 7"  (1.397 m)   Body mass index is 22.89 kg/m. Physical Exam  Constitutional: She is oriented to person,  place, and time. She appears well-developed and well-nourished. No distress.  Pleasant Elderly in no acute distress  HENT:  Head: Normocephalic.  Mouth/Throat: Oropharynx is clear and moist. No oropharyngeal exudate.  Eyes: Pupils are equal, round, and reactive to light. Conjunctivae and EOM are normal. Right eye exhibits no discharge. Left eye exhibits no discharge. No scleral icterus.  Neck: Normal range of motion. No JVD present. No thyromegaly present.  Cardiovascular: Intact distal pulses.  Exam reveals no gallop and no friction rub.   Murmur heard. Pace maker   Pulmonary/Chest: Effort normal and breath sounds normal. No respiratory distress. She has no wheezes. She has no rales.  Abdominal: Soft. Bowel sounds are normal. She exhibits no distension. There is no tenderness. There is no rebound and no guarding.  Musculoskeletal: She exhibits no tenderness.  Unsteady gait uses walker with seat. Arthritic changes to fingers noted. Bilateral lower extremities 2+ Edema awaiting ted hose.   Lymphadenopathy:    She has no cervical adenopathy.  Neurological: She is oriented to person, place, and time.  Unsteady gait   Skin: Skin is warm and dry. No rash noted. No erythema. No pallor.  Psychiatric: She has a normal mood and affect.    Labs reviewed:  Recent Labs  03/24/17 1429  04/02/17 2027 04/04/17 0334 04/05/17 0415 04/05/17 1159 07/06/17 07/26/17  NA  --   < > 133* 134* 136  --  134* 136*  K  --   < > 4.6 4.5 5.3* 4.0 4.5 4.6  CL  --   < > 102 105 107  --   --   --   CO2  --   < > 24 23 23   --   --   --   GLUCOSE  --   < > 107* 84 67  --   --   --   BUN  --   < > 14 12 12   --   --  24*  CREATININE  --   < > 0.65 0.61 0.62  --  0.7 0.7  CALCIUM  --   < > 8.9 8.1* 8.3*  --   --   --   MG 1.9  --   --   --   --   --   --   --   PHOS 3.6  --   --   --   --   --   --   --   < > =  values in this interval not displayed.  Recent Labs  12/03/16 1053 03/24/17 1138 04/02/17 2027  07/06/17 07/26/17  AST 37 30 27 22 22   ALT 21 16 20 17 16   ALKPHOS 91 64 68 68 62  BILITOT 0.4 0.6 0.7  --   --   PROT 5.9* 5.7* 6.2*  --   --   ALBUMIN 3.4* 3.4* 3.8  --   --     Recent Labs  12/03/16 1053  04/02/17 2027 04/04/17 0334 04/05/17 0415 07/06/17 07/26/17  WBC 8.5  < > 8.2 6.9 9.5 6.5 6.6  NEUTROABS 6.3  --   --   --   --   --   --   HGB 12.0  < > 12.4 11.2* 12.2 9.9* 10.3*  HCT 35.2*  < > 35.4* 32.7* 35.2* 29* 31*  MCV 92.9  < > 90.8 94.5 94.4  --   --   PLT 214  < > 280 231 220 246 207  < > = values in this interval not displayed. Lab Results  Component Value Date   TSH 1.00 07/06/2017   Significant Diagnostic Results in last 30 days:  No results found.  Assessment/Plan 1. Generalized weakness Requires assistance with ADL's. Continue to work with therapy. Infectious etiology ruled out. Continue to encourage hydration and oral intake. Continue to monitor.   2. Paranoid psychosis  No new concerns with recent Risperdal GDR. Continue to monitor.    Family/ staff Communication: Reviewed plan of care with patient and facility Nurse.   Labs/tests ordered: None   Kregg Cihlar C Eliot Bencivenga, NP

## 2017-08-03 ENCOUNTER — Encounter: Payer: Self-pay | Admitting: Internal Medicine

## 2017-08-03 ENCOUNTER — Ambulatory Visit (INDEPENDENT_AMBULATORY_CARE_PROVIDER_SITE_OTHER): Payer: Medicare PPO | Admitting: Internal Medicine

## 2017-08-03 VITALS — BP 144/62 | HR 66 | Ht <= 58 in | Wt 99.0 lb

## 2017-08-03 DIAGNOSIS — R001 Bradycardia, unspecified: Secondary | ICD-10-CM | POA: Diagnosis not present

## 2017-08-03 DIAGNOSIS — Z950 Presence of cardiac pacemaker: Secondary | ICD-10-CM

## 2017-08-03 DIAGNOSIS — I440 Atrioventricular block, first degree: Secondary | ICD-10-CM

## 2017-08-03 DIAGNOSIS — I480 Paroxysmal atrial fibrillation: Secondary | ICD-10-CM | POA: Diagnosis not present

## 2017-08-03 DIAGNOSIS — I495 Sick sinus syndrome: Secondary | ICD-10-CM | POA: Diagnosis not present

## 2017-08-03 NOTE — Addendum Note (Signed)
Addended byOneal Grout: Melanie Obrien on: 08/03/2017 02:16 PM   Modules accepted: Level of Service

## 2017-08-03 NOTE — Patient Instructions (Signed)
Medication Instructions: Your physician recommends that you continue on your current medications as directed. Please refer to the Current Medication list given to you today.   Labwork: None Ordered   Procedures/Testing: None Ordered   Follow-Up: Your physician wants you to follow-up in: 1 YEAR with Dr. Graciela HusbandsKlein. You will receive a reminder letter in the mail two months in advance. If you don't receive a letter, please call our office to schedule the follow-up appointment.  Your physician wants you to follow-up in: 6 MONTHS with [facility]. You will receive a reminder letter in the mail two months in advance. If you don't receive a letter, please call our office to schedule the follow-up appointment.    Any Additional Special Instructions Will Be Listed Below (If Applicable).     If you need a refill on your cardiac medications before your next appointment, please call your pharmacy.

## 2017-08-03 NOTE — Progress Notes (Signed)
Patient Care Team: Oneal Grout, MD as PCP - General (Internal Medicine) Duke Salvia, MD as Consulting Physician (Cardiology) Corky Crafts, MD as Consulting Physician (Interventional Cardiology) Sheral Apley, MD as Attending Physician (Orthopedic Surgery)   HPI  Melanie Obrien is a 83 y.o. female Seen in follow-up for pacemaker implanted for bradycardia noted on the loop recorder.   She has a history of a TIA and atrial fibrillation detected on her monitor. She is here with her daughter Dr. Gweneth Obrien.  She has a history of acute on chronic HFpEF treated with diuresis.   No interval Afib   Had spell the other day where she was unresponsive but her eyes were open. Her daughter recounts problems in March/April where she became psychotic responding to Respidral. At that time she had some similar episodes.         Past Medical History:  Diagnosis Date  . Acute encephalopathy 03/24/2017  . Acute on chronic diastolic heart failure (HCC)   . AICD (automatic cardioverter/defibrillator) present   . Arthritis    "plenty" (06/17/2015)  . Atrial fibrillation (HCC)   . CHF (congestive heart failure) (HCC)   . Chronic back pain   . Chronic kidney disease    02/17/16 Bun 34, creat 1.05  . Complication of anesthesia    "30 minutes violent shaking after a dental procedure"   . Degenerative disc disease, lumbar    . Diastolic heart failure (HCC)   . Gluten intolerance    . Heart murmur   . Hypertension   . Hypothyroidism    "from the aminodarone"  . Idiopathic scoliosis    . Osteoporosis   . Paroxysmal atrial fibrillation (HCC)   . Pleural effusion 2011   "hugh w/pneumonia"  . Pneumonia 2011; 11/2014  . Sinus bradycardia   . Stroke [facility])    TIA  . Thyroid disease   . TIA (transient ischemic attack) 2000's X 1  . Trigeminal neuralgia of left side of face 12/13/2014  . Unstable gait      Past Surgical History:  Procedure Laterality Date  .  BI-VENTRICULAR IMPLANTABLE CARDIOVERTER DEFIBRILLATOR  (CRT-D)  06/17/2015  . CATARACT EXTRACTION W/ INTRAOCULAR LENS  IMPLANT, BILATERAL Bilateral 1991-1992  . EP IMPLANTABLE DEVICE N/A 06/17/2015   Procedure: Pacemaker Implant;  Surgeon: Duke Salvia, MD;  Location: [facility] INVASIVE CV LAB;  Service: Cardiovascular;  Laterality: N/A;  . LOOP RECORDER IMPLANT  08/2013   explanted 06/17/2015  . PACEMAKER INSERTION    . TOENAIL AVULSION Left    2nd digit  . TONSILLECTOMY  1944    Current Outpatient Prescriptions  Medication Sig Dispense Refill  . acetaminophen (TYLENOL) 325 MG tablet Take 650 mg by mouth every 6 (six) hours as needed for mild pain or fever.     Marland Kitchen apixaban (ELIQUIS) 2.5 MG TABS tablet Take 1 tablet (2.5 mg total) by mouth 2 (two) times daily. 60 tablet 5  . erythromycin ophthalmic ointment Place 1 application into the right eye 2 (two) times daily. Stop date 08/09/17    . feeding supplement (BOOST / RESOURCE BREEZE) LIQD Take by mouth 2 (two) times daily. Give 5 ounces    . fexofenadine (ALLEGRA) 180 MG tablet Take 180 mg by mouth daily as needed.     Marland Kitchen levothyroxine (SYNTHROID, LEVOTHROID) 75 MCG tablet Take 1 tablet (75 mcg total) by mouth daily before breakfast. Reported on 02/04/2016 30 tablet 0  . lisinopril (PRINIVIL,ZESTRIL) 5 MG  tablet Take 1 tablet (5 mg total) by mouth daily. 30 tablet 11  . magnesium hydroxide (MILK OF MAGNESIA) 400 MG/5ML suspension Take 30 mLs by mouth as needed for mild constipation.     . metoprolol tartrate (LOPRESSOR) 25 MG tablet Take 1 tablet (25 mg total) by mouth 2 (two) times daily. 60 tablet 12  . risperiDONE (RISPERDAL) 0.25 MG tablet Take 0.25 mg by mouth at bedtime.    . Skin Protectants, Misc. (AMERIGEL CARE) LOTN Apply 1 application topically as needed.     No current facility-administered medications for this visit.     Social History   Social History  . Marital status: Widowed    Spouse name: N/A  . Number of children: N/A  .  Years of education: N/A   Occupational History  . Not on file.   Social History Main Topics  . Smoking status: Never Smoker  . Smokeless tobacco: Never Used  . Alcohol use No  . Drug use: No  . Sexual activity: No   Other Topics Concern  . Not on file   Social History Narrative   ** Merged History Encounter **         Review of Systems negative except from HPI and PMH  Physical Exam BP (!) 144/62   Pulse 66   Ht 4\' 7"  (1.397 m)   Wt 99 lb (44.9 kg)   SpO2 96%   BMI 23.01 kg/m  Well developed and well nourished in no acute distress HENT normal E scleral and icterus clear Neck Supple JVP 6-8 ; carotids brisk and full Clear to ausculation Device pocket well healed; without hematoma or erythema.  There is no tethering  Regular rate and rhythm, 3/6  Murmur at apex   gallops or rub Soft with active bowel sounds No clubbing cyanosis edema   Alert and oriented, grossly normal motor and sensory function Skin Warm and Dry  ECG demonstrates  AV pacing  Intervals 23/14/40 Axis left -77   Assessment and  Plan  Atrial fibrillation-paroxysmal  Sinus bradycardia post termination pauses  Pacemaker-[employer] Jude The patient's device was interrogated.  The information was reviewed. No changes were made in the programming.     First-degree AV block/left axis deviation  Chronic HFpEF  History of TIA    No interval afib  On Anticoagulation;  No bleeding issues   Euvolemic continue current meds  Progressive conduction disease now w increased V pacing  preop will need blepharoplasty  Can hold apixoban for surgery as needed

## 2017-08-03 NOTE — Addendum Note (Signed)
Addended by: Binta Statzer on: 08/03/2017 02:16 PM   Modules accepted: Level of Service  

## 2017-08-06 ENCOUNTER — Non-Acute Institutional Stay: Payer: Medicare PPO | Admitting: Internal Medicine

## 2017-08-06 DIAGNOSIS — F22 Delusional disorders: Secondary | ICD-10-CM | POA: Diagnosis not present

## 2017-08-06 DIAGNOSIS — F422 Mixed obsessional thoughts and acts: Secondary | ICD-10-CM | POA: Insufficient documentation

## 2017-08-06 NOTE — Progress Notes (Addendum)
[facility]   Provider: Oneal Grout MD   Location:      Place of Service:    ALF  PCP: Oneal Grout, MD Patient Care Team: Oneal Grout, MD as PCP - General (Internal Medicine) Duke Salvia, MD as Consulting Physician (Cardiology) Corky Crafts, MD as Consulting Physician (Interventional Cardiology) Sheral Apley, MD as Attending Physician (Orthopedic Surgery)  Extended Emergency Contact Information Primary Emergency Contact: McNeill,Dr. Robin Searing [place] Home Phone: [phone] Work Phone: [phone] Mobile Phone: [phone] Relation: Daughter  Goals of Care: Advanced Directive information Advanced Directives 08/02/2017  Does Patient Have a Medical Advance Directive? Yes  Type of Advance Directive Out of facility DNR (pink MOST or yellow form);Living will;Healthcare Power of Attorney  Does patient want to make changes to medical advance directive? -  Copy of Healthcare Power of Attorney in Chart? Yes  Would patient like information on creating a medical advance directive? -  Pre-existing out of facility DNR order (yellow form or pink MOST form) Yellow form placed in chart (order not valid for inpatient use);Pink MOST form placed in chart (order not valid for inpatient use)      Chief Complaint  Patient presents with  . Acute Visit    behavior changes    HPI: Patient is a 83 y.o. female seen today for acute visit for concern from daughter about behavior changes. She had her risperdione dose decreased from bid to daily almost 2 weeks back. Pt has been more alert and active for now but staff and pt have noticed epsiodes where she is being obsessed about certain things to be done certain way. There are times when she appears hyperactive. No agitation or anger or impulsiveness reported. Pt seen in her room today. She denies any hallucination. He feels that her mood has been good. feels anxious at times. Does not want to be on  any medication for mood or behavior. Complaints of occasional back pain but refuses to ask for any pain med.    Past Medical History:  Diagnosis Date  . Acute encephalopathy 03/24/2017  . Acute on chronic diastolic heart failure (HCC)   . AICD (automatic cardioverter/defibrillator) present   . Arthritis    "plenty" (06/17/2015)  . Atrial fibrillation (HCC)   . CHF (congestive heart failure) (HCC)   . Chronic back pain   . Chronic kidney disease    02/17/16 Bun 34, creat 1.05  . Complication of anesthesia    "30 minutes violent shaking after a dental procedure"   . Degenerative disc disease, lumbar    . Diastolic heart failure (HCC)   . Gluten intolerance    . Heart murmur   . Hypertension   . Hypothyroidism    "from the aminodarone"  . Idiopathic scoliosis    . Osteoporosis   . Paroxysmal atrial fibrillation (HCC)   . Pleural effusion 2011   "hugh w/pneumonia"  . Pneumonia 2011; 11/2014  . Sinus bradycardia   . Stroke [facility])    TIA  . Thyroid disease   . TIA (transient ischemic attack) 2000's X 1  . Trigeminal neuralgia of left side of face 12/13/2014  . Unstable gait     Past Surgical History:  Procedure Laterality Date  . BI-VENTRICULAR IMPLANTABLE CARDIOVERTER DEFIBRILLATOR  (CRT-D)  06/17/2015  . CATARACT EXTRACTION W/ INTRAOCULAR LENS  IMPLANT, BILATERAL Bilateral 1991-1992  . EP IMPLANTABLE DEVICE N/A 06/17/2015   Procedure: Pacemaker Implant;  Surgeon: Duke Salvia,  MD;  Location: [facility] INVASIVE CV LAB;  Service: Cardiovascular;  Laterality: N/A;  . LOOP RECORDER IMPLANT  08/2013   explanted 06/17/2015  . PACEMAKER INSERTION    . TOENAIL AVULSION Left    2nd digit  . TONSILLECTOMY  1944    reports that she has never smoked. She has never used smokeless tobacco. She reports that she does not drink alcohol or use drugs. Social History   Social History  . Marital status: Widowed    Spouse name: N/A  . Number of children: N/A  . Years of education: N/A    Occupational History  . Not on file.   Social History Main Topics  . Smoking status: Never Smoker  . Smokeless tobacco: Never Used  . Alcohol use No  . Drug use: No  . Sexual activity: No   Other Topics Concern  . Not on file   Social History Narrative   ** Merged History Encounter **         Family History  Problem Relation Age of Onset  . Stroke Mother   . Hypertension Father   . Heart attack Neg Hx     Health Maintenance  Topic Date Due  . DEXA SCAN  01/18/1986  . PNA vac Low Risk Adult (1 of 2 - PCV13) 01/18/1986  . INFLUENZA VACCINE  07/25/2017  . TETANUS/TDAP  03/05/2024    Allergies  Allergen Reactions  . Codeine Other (See Comments)    unknown  . Codeine Nausea And Vomiting    & headaches  . Levaquin [Levofloxacin In D5w] Itching  . Levaquin [Levofloxacin In D5w] Swelling and Other (See Comments)    & redness  . Loratadine Other (See Comments)    TACHYCARDIA  . Xylocaine [Lidocaine Hcl] Other (See Comments)    Uncontrolled shaking  . Xylocaine [Lidocaine Hcl] Other (See Comments)    Made her very cold & lots of shaking. Pt and daughter stated that she can take it.  . Loratadine Palpitations    Outpatient Encounter Prescriptions as of 08/06/2017  Medication Sig  . acetaminophen (TYLENOL) 325 MG tablet Take 650 mg by mouth every 6 (six) hours as needed for mild pain or fever.   Marland Kitchen apixaban (ELIQUIS) 2.5 MG TABS tablet Take 1 tablet (2.5 mg total) by mouth 2 (two) times daily.  Marland Kitchen erythromycin ophthalmic ointment Place 1 application into the right eye 2 (two) times daily. Stop date 08/09/17  . feeding supplement (BOOST / RESOURCE BREEZE) LIQD Take by mouth 2 (two) times daily. Give 5 ounces  . fexofenadine (ALLEGRA) 180 MG tablet Take 180 mg by mouth daily as needed.   Marland Kitchen levothyroxine (SYNTHROID, LEVOTHROID) 75 MCG tablet Take 1 tablet (75 mcg total) by mouth daily before breakfast. Reported on 02/04/2016  . lisinopril (PRINIVIL,ZESTRIL) 5 MG tablet  Take 1 tablet (5 mg total) by mouth daily.  . magnesium hydroxide (MILK OF MAGNESIA) 400 MG/5ML suspension Take 30 mLs by mouth as needed for mild constipation.   . metoprolol tartrate (LOPRESSOR) 25 MG tablet Take 1 tablet (25 mg total) by mouth 2 (two) times daily.  . risperiDONE (RISPERDAL) 0.25 MG tablet Take 0.25 mg by mouth at bedtime.  . Skin Protectants, Misc. (AMERIGEL CARE) LOTN Apply 1 application topically as needed.   No facility-administered encounter medications on file as of 08/06/2017.     Review of Systems  Constitutional: Positive for activity change. Negative for appetite change, chills, diaphoresis, fatigue, fever and unexpected weight change.  HENT: Positive  for hearing loss and rhinorrhea. Negative for congestion, postnasal drip, sore throat, tinnitus and trouble swallowing.        Has hearing aid, has allergic rhinitis  Eyes: Positive for visual disturbance. Negative for pain and itching.       Has corrective lenses  Respiratory: Negative for cough and shortness of breath.   Cardiovascular: Positive for leg swelling. Negative for chest pain and palpitations.  Gastrointestinal: Negative for abdominal pain, constipation, nausea and vomiting.       Prunes help with her bowel movement  Genitourinary: Negative for dysuria.       Has urinary incontinence  Musculoskeletal: Positive for back pain and gait problem.       Chronic low back pain  Skin: Negative for pallor, rash and wound.  Neurological: Positive for weakness. Negative for dizziness, tremors, seizures, syncope, light-headedness, numbness and headaches.  Psychiatric/Behavioral: Negative for agitation, behavioral problems, confusion, decreased concentration, dysphoric mood, hallucinations, sleep disturbance and suicidal ideas. The patient is nervous/anxious.     Vitals:   08/06/17 1854  BP: 130/68  Pulse: 62  Resp: 16  Temp: 98.4 F (36.9 C)   There is no height or weight on file to calculate BMI.   Wt  Readings from Last 3 Encounters:  08/03/17 99 lb (44.9 kg)  08/02/17 98 lb 8 oz (44.7 kg)  07/27/17 98 lb 8 oz (44.7 kg)   Physical Exam  Constitutional: She is oriented to person, place, and time. She appears well-developed and well-nourished. No distress.  Elderly female patient  HENT:  Head: Normocephalic and atraumatic.  Mouth/Throat: Oropharynx is clear and moist.  Has hearing aids.   Eyes: Conjunctivae and EOM are normal. Right eye exhibits no discharge.  Has corrective lenses  Neck: Normal range of motion. Neck supple.  Cardiovascular: Normal rate and regular rhythm.   Murmur heard. Systolic murmur +  Pulmonary/Chest: Effort normal and breath sounds normal. No respiratory distress. She has no wheezes. She has no rales. She exhibits no tenderness.  Abdominal: Soft. Bowel sounds are normal. She exhibits no distension and no mass. There is no tenderness. There is no rebound and no guarding.  Musculoskeletal: She exhibits edema.  Good dorsalis pedis, Unsteady gait, using walker with brake and seat. Able to move all 4 extremities, has arthritis changes to her shoulders and fingers. Chronic leg edema 1+. Takes small steps  Lymphadenopathy:    She has no cervical adenopathy.  Neurological: She is alert and oriented to person, place, and time.  Skin: Skin is warm and dry. No rash noted. She is not diaphoretic. No erythema. No pallor.  Psychiatric: She has a normal mood and affect.  Needs redirection at times. Appears bothered when asked about being started on pill to help with her thoughts and mood. Refuses to be on any new med.     Labs reviewed: Basic Metabolic Panel:  Recent Labs  16/10/96 1429  04/02/17 2027 04/04/17 0334 04/05/17 0415 04/05/17 1159 07/06/17 07/26/17  NA  --   < > 133* 134* 136  --  134* 136*  K  --   < > 4.6 4.5 5.3* 4.0 4.5 4.6  CL  --   < > 102 105 107  --   --   --   CO2  --   < > 24 23 23   --   --   --   GLUCOSE  --   < > 107* 84 67  --   --   --  BUN  --   < > 14 12 12   --   --  24*  CREATININE  --   < > 0.65 0.61 0.62  --  0.7 0.7  CALCIUM  --   < > 8.9 8.1* 8.3*  --   --   --   MG 1.9  --   --   --   --   --   --   --   PHOS 3.6  --   --   --   --   --   --   --   < > = values in this interval not displayed. Liver Function Tests:  Recent Labs  12/03/16 1053 03/24/17 1138 04/02/17 2027 07/06/17 07/26/17  AST 37 30 27 22 22   ALT 21 16 20 17 16   ALKPHOS 91 64 68 68 62  BILITOT 0.4 0.6 0.7  --   --   PROT 5.9* 5.7* 6.2*  --   --   ALBUMIN 3.4* 3.4* 3.8  --   --    No results for input(s): LIPASE, AMYLASE in the last 8760 hours. No results for input(s): AMMONIA in the last 8760 hours. CBC:  Recent Labs  12/03/16 1053  04/02/17 2027 04/04/17 0334 04/05/17 0415 07/06/17 07/26/17  WBC 8.5  < > 8.2 6.9 9.5 6.5 6.6  NEUTROABS 6.3  --   --   --   --   --   --   HGB 12.0  < > 12.4 11.2* 12.2 9.9* 10.3*  HCT 35.2*  < > 35.4* 32.7* 35.2* 29* 31*  MCV 92.9  < > 90.8 94.5 94.4  --   --   PLT 214  < > 280 231 220 246 207  < > = values in this interval not displayed. Cardiac Enzymes:  Recent Labs  12/03/16 1053  TROPONINI <0.03   BNP: Invalid input(s): POCBNP No results found for: HGBA1C Lab Results  Component Value Date   TSH 1.00 07/06/2017   Lab Results  Component Value Date   VITAMINB12 927 (H) 03/24/2017   No results found for: FOLATE No results found for: IRON, TIBC, FERRITIN  Lipid Panel: No results for input(s): CHOL, HDL, LDLCALC, TRIG, CHOLHDL, LDLDIRECT in the last 8760 hours. No results found for: HGBA1C  Procedures since last visit: No results found.  Assessment/Plan  OCD Has had OCD in past per daughter. Appears anxious at times. Sertraline 25 mg daily would be a medication to try for her. Pt refuses medication at present.   Paranoid psychosis Improved. Continue risperidone 0. 25 mg daily at bedtime. Daughter is concerned about her being hyperactive at times and if this could lead to  flare up of her behavior and cause psychosis again. Her energy level has improved with reduction in dosing of risperdal. Explained about her behavioral findings and if sertraline low dose might help with her mood, anxiety and OCD type of behavior. Patient refusing medication for now. Daughter agrees with care plan. Advised for daughter to talk to patient about benefit of this med and assess further. Also advised daughter to talk about possible psychiatry consult. Pt has refused psych service in past per daughter as "she thinks only crazy people are seen by psych service". Will need to revisit this with pt again next visit.    Communication: reviewed care plan with patient, her daughter and charge nurse.   Spent more than 25 minutes with patient this visit of which more than 50% related to direct  patient care. Reviewed care plan with her daughter (over the phone) and nursing supervisor as well.     Oneal Grout, MD Internal Medicine [facility] [address] [place], [place] [address] Cell Phone (Monday-Friday 8 am - 5 pm): [phone] On Call: [phone] and follow prompts after 5 pm and on weekends Office Phone: [phone] Office Fax: [phone]   08/08/17- saw resident today. Reviewed plan about starting a low dose sertaline and getting psychiatry consult. She agrees with it today. The nursing supervisor is present during the visit. Will start sertraline 12.5 mg daily. Psychiatry consult placed for counselling for OCD. Nursing to notify family of changes.   Oneal Grout, MD  [facility] [phone] (Monday-Friday 8 am - 5 pm) [phone] (afterhours)

## 2017-08-07 LAB — CUP PACEART INCLINIC DEVICE CHECK
Battery Remaining Longevity: 117 mo
Brady Statistic RA Percent Paced: 47 %
Brady Statistic RV Percent Paced: 4.4 %
Date Time Interrogation Session: 20180810204204
Implantable Lead Implant Date: 20160623
Implantable Lead Implant Date: 20160623
Implantable Lead Location: 753860
Lead Channel Impedance Value: 662.5 Ohm
Lead Channel Pacing Threshold Amplitude: 0.5 V
Lead Channel Pacing Threshold Pulse Width: 0.4 ms
Lead Channel Pacing Threshold Pulse Width: 1 ms
Lead Channel Sensing Intrinsic Amplitude: 10 mV
Lead Channel Setting Pacing Amplitude: 2.5 V
Lead Channel Setting Sensing Sensitivity: 2 mV
MDC IDC LEAD LOCATION: 753859
MDC IDC MSMT BATTERY VOLTAGE: 3.01 V
MDC IDC MSMT LEADCHNL RA IMPEDANCE VALUE: 525 Ohm
MDC IDC MSMT LEADCHNL RA PACING THRESHOLD AMPLITUDE: 0.5 V
MDC IDC MSMT LEADCHNL RA SENSING INTR AMPL: 1.5 mV
MDC IDC PG IMPLANT DT: 20160623
MDC IDC SET LEADCHNL RV PACING AMPLITUDE: 2.5 V
MDC IDC SET LEADCHNL RV PACING PULSEWIDTH: 0.4 ms
Pulse Gen Model: 2240
Pulse Gen Serial Number: 7779640

## 2017-08-14 ENCOUNTER — Encounter: Payer: Self-pay | Admitting: Family

## 2017-08-14 ENCOUNTER — Non-Acute Institutional Stay: Payer: Medicare PPO | Admitting: Family

## 2017-08-14 DIAGNOSIS — F422 Mixed obsessional thoughts and acts: Secondary | ICD-10-CM

## 2017-08-14 DIAGNOSIS — H04123 Dry eye syndrome of bilateral lacrimal glands: Secondary | ICD-10-CM | POA: Diagnosis not present

## 2017-08-15 ENCOUNTER — Non-Acute Institutional Stay: Payer: Medicare PPO

## 2017-08-15 DIAGNOSIS — Z0000 Encounter for general adult medical examination without abnormal findings: Secondary | ICD-10-CM | POA: Diagnosis not present

## 2017-08-15 MED ORDER — REFRESH P.M. OP OINT: TOPICAL_OINTMENT | Freq: Every day | OPHTHALMIC | Status: DC

## 2017-08-15 NOTE — Progress Notes (Signed)
Subjective:   Melanie Obrien is a 83 y.o. female who presents for an Initial Medicare Annual Wellness Visit at [facility]    Objective:    Today's Vitals   08/15/17 0856  BP: 140/65  Pulse: 60  Temp: (!) 97.5 F (36.4 C)  TempSrc: Oral  SpO2: 98%  Weight: 99 lb (44.9 kg)  Height: 4\' 7"  (1.397 m)   Body mass index is 23.01 kg/m.   Current Medications (verified) Outpatient Encounter Prescriptions as of 08/15/2017  Medication Sig  . acetaminophen (TYLENOL) 325 MG tablet Take 650 mg by mouth every 6 (six) hours as needed for mild pain or fever.   Marland Kitchen apixaban (ELIQUIS) 2.5 MG TABS tablet Take 1 tablet (2.5 mg total) by mouth 2 (two) times daily.  . Artificial Tear Ointment (ARTIFICIAL TEARS) ointment Place into both eyes at bedtime.  Marland Kitchen erythromycin ophthalmic ointment Place 1 application into the right eye 2 (two) times daily. Stop date 08/09/17  . fexofenadine (ALLEGRA) 180 MG tablet Take 180 mg by mouth daily as needed.   Marland Kitchen levothyroxine (SYNTHROID, LEVOTHROID) 75 MCG tablet Take 1 tablet (75 mcg total) by mouth daily before breakfast. Reported on 02/04/2016  . lisinopril (PRINIVIL,ZESTRIL) 5 MG tablet Take 1 tablet (5 mg total) by mouth daily.  . magnesium hydroxide (MILK OF MAGNESIA) 400 MG/5ML suspension Take 30 mLs by mouth as needed for mild constipation.   . metoprolol tartrate (LOPRESSOR) 25 MG tablet Take 1 tablet (25 mg total) by mouth 2 (two) times daily.  . risperiDONE (RISPERDAL) 0.25 MG tablet Take 0.25 mg by mouth at bedtime.  . sertraline (ZOLOFT) 25 MG tablet Take 25 mg by mouth daily.  . Skin Protectants, Misc. (AMERIGEL CARE) LOTN Apply 1 application topically as needed.  . [DISCONTINUED] feeding supplement (BOOST / RESOURCE BREEZE) LIQD Take by mouth 2 (two) times daily. Give 5 ounces   No facility-administered encounter medications on file as of 08/15/2017.     Allergies (verified) Codeine; Codeine; Levaquin [levofloxacin in d5w];  Levaquin [levofloxacin in d5w]; Loratadine; Xylocaine [lidocaine hcl]; Xylocaine [lidocaine hcl]; and Loratadine   History: Past Medical History:  Diagnosis Date  . Acute encephalopathy 03/24/2017  . Acute on chronic diastolic heart failure (HCC)   . AICD (automatic cardioverter/defibrillator) present   . Arthritis    "plenty" (06/17/2015)  . Atrial fibrillation (HCC)   . CHF (congestive heart failure) (HCC)   . Chronic back pain   . Chronic kidney disease    02/17/16 Bun 34, creat 1.05  . Complication of anesthesia    "30 minutes violent shaking after a dental procedure"   . Degenerative disc disease, lumbar    . Diastolic heart failure (HCC)   . Gluten intolerance    . Heart murmur   . Hypertension   . Hypothyroidism    "from the aminodarone"  . Idiopathic scoliosis    . Osteoporosis   . Paroxysmal atrial fibrillation (HCC)   . Pleural effusion 2011   "hugh w/pneumonia"  . Pneumonia 2011; 11/2014  . Sinus bradycardia   . Stroke [facility])    TIA  . Thyroid disease   . TIA (transient ischemic attack) 2000's X 1  . Trigeminal neuralgia of left side of face 12/13/2014  . Unstable gait     Past Surgical History:  Procedure Laterality Date  . BI-VENTRICULAR IMPLANTABLE CARDIOVERTER DEFIBRILLATOR  (CRT-D)  06/17/2015  . CATARACT EXTRACTION W/ INTRAOCULAR LENS  IMPLANT, BILATERAL Bilateral 1991-1992  . EP IMPLANTABLE  DEVICE N/A 06/17/2015   Procedure: Pacemaker Implant;  Surgeon: Duke Salvia, MD;  Location: [facility] INVASIVE CV LAB;  Service: Cardiovascular;  Laterality: N/A;  . LOOP RECORDER IMPLANT  08/2013   explanted 06/17/2015  . PACEMAKER INSERTION    . TOENAIL AVULSION Left    2nd digit  . TONSILLECTOMY  1944   Family History  Problem Relation Age of Onset  . Stroke Mother   . Hypertension Father   . Heart attack Neg Hx    Social History   Occupational History  . Not on file.   Social History Main Topics  . Smoking status: Never Smoker  . Smokeless tobacco: Never  Used  . Alcohol use No  . Drug use: No  . Sexual activity: No    Tobacco Counseling Counseling given: Not Answered   Activities of Daily Living In your present state of health, do you have any difficulty performing the following activities: 08/15/2017 04/03/2017  Hearing? Malvin Johns  Vision? N Y  Difficulty concentrating or making decisions? Malvin Johns  Walking or climbing stairs? Y Y  Dressing or bathing? Y Y  Doing errands, shopping? Y N  Preparing Food and eating ? Y -  Using the Toilet? N -  In the past six months, have you accidently leaked urine? Y -  Do you have problems with loss of bowel control? N -  Managing your Medications? Y -  Managing your Finances? Y -  Housekeeping or managing your Housekeeping? Y -  Some recent data might be hidden    Immunizations and Health Maintenance Immunization History  Administered Date(s) Administered  . Influenza-Unspecified 10/08/2014, 09/23/2015, 10/05/2016  . PPD Test 06/22/2014, 06/11/2015, 01/11/2016  . Td 03/05/2014   Health Maintenance Due  Topic Date Due  . DEXA SCAN  01/18/1986  . PNA vac Low Risk Adult (1 of 2 - PCV13) 01/18/1986  . INFLUENZA VACCINE  07/25/2017    Patient Care Team: Oneal Grout, MD as PCP - General (Internal Medicine) Duke Salvia, MD as Consulting Physician (Cardiology) Corky Crafts, MD as Consulting Physician (Interventional Cardiology) Sheral Apley, MD as Attending Physician (Orthopedic Surgery)  Indicate any recent Medical Services you may have received from other than [facility] providers in the past year (date may be approximate).     Assessment:   This is a routine wellness examination for [name].   Hearing/Vision screen No exam data present  Dietary issues and exercise activities discussed: Current Exercise Habits: The patient does not participate in regular exercise at present, Exercise limited by: orthopedic condition(s)  Goals    None     Depression Screen PHQ 2/9 Scores  08/15/2017 02/18/2016 04/24/2015  PHQ - 2 Score 0 0 0    Fall Risk Fall Risk  08/15/2017 02/18/2016 04/24/2015  Falls in the past year? No No Yes  Number falls in past yr: - - 2 or more  Injury with Fall? - - Yes  Risk Factor Category  - - High Fall Risk  Risk for fall due to : - - History of fall(s);Impaired balance/gait  Follow up - - Falls evaluation completed    Cognitive Function: MMSE - Mini Mental State Exam 08/01/2017 02/18/2016  Orientation to time 4 5  Orientation to Place 5 5  Registration 3 3  Attention/ Calculation 4 5  Recall 2 3  Language- name 2 objects 2 2  Language- repeat 1 1  Language- follow 3 step command 3 3  Language- read & follow  direction 1 1  Write a sentence 1 1  Copy design 0 0  Total score 26 29        Screening Tests Health Maintenance  Topic Date Due  . DEXA SCAN  01/18/1986  . PNA vac Low Risk Adult (1 of 2 - PCV13) 01/18/1986  . INFLUENZA VACCINE  07/25/2017  . TETANUS/TDAP  03/05/2024      Plan:    I have personally reviewed and addressed the Medicare Annual Wellness questionnaire and have noted the following in the patient's chart:  A. Medical and social history B. Use of alcohol, tobacco or illicit drugs  C. Current medications and supplements D. Functional ability and status E.  Nutritional status F.  Physical activity G. Advance directives H. List of other physicians I.  Hospitalizations, surgeries, and ER visits in previous 12 months J.  Vitals K. Screenings to include hearing, vision, cognitive, depression L. Referrals and appointments - none  In addition, I have reviewed and discussed with patient certain preventive protocols, quality metrics, and best practice recommendations. A written personalized care plan for preventive services as well as general preventive health recommendations were provided to patient.  See attached scanned questionnaire for additional information.   Signed,   Annetta Maw, RN Nurse Health  Advisor   Quick Notes   Health Maintenance: PNA 13 and DEXA due     Abnormal Screen: MMSE 08/01/17-26/30. Passed clock drawing.     Patient Concerns: None     Nurse Concerns: None

## 2017-08-15 NOTE — Progress Notes (Signed)
Location:  [facility] Room Number: 31 Place of Service:  ALF [phone]) Provider: Mccabe Gloria FNP-C  Oneal Grout, MD  Patient Care Team: Oneal Grout, MD as PCP - General (Internal Medicine) Duke Salvia, MD as Consulting Physician (Cardiology) Corky Crafts, MD as Consulting Physician (Interventional Cardiology) Sheral Apley, MD as Attending Physician (Orthopedic Surgery)  Extended Emergency Contact Information Primary Emergency Contact: McNeill,Dr. Robin Searing [place] Home Phone: [phone] Work Phone: [phone] Mobile Phone: [phone] Relation: Daughter  Code Status:  DNR Goals of care: Advanced Directive information Advanced Directives 08/14/2017  Does Patient Have a Medical Advance Directive? Yes  Type of Advance Directive Out of facility DNR (pink MOST or yellow form);Living will;Healthcare Power of Attorney  Does patient want to make changes to medical advance directive? -  Copy of Healthcare Power of Attorney in Chart? Yes  Would patient like information on creating a medical advance directive? -  Pre-existing out of facility DNR order (yellow form or pink MOST form) Yellow form placed in chart (order not valid for inpatient use);Pink MOST form placed in chart (order not valid for inpatient use)     Chief Complaint  Patient presents with  . Acute Visit    Altered mental status; ? med related    HPI:  Pt is a 83 y.o. female seen today at [facility] for an acute visit for evaluation of AMS and review medications per patient's daughter request. She states patient called her today seems to be more confused, worries at lot then tends to be more obsessive.She refused her shower today.Patient states her morning eye ointment prevents her from seeing. Patient's daughter would like ointment changed to at bedtime only.No fever, chills or cough reported.       Past Medical History:  Diagnosis Date  . Acute  encephalopathy 03/24/2017  . Acute on chronic diastolic heart failure (HCC)   . AICD (automatic cardioverter/defibrillator) present   . Arthritis    "plenty" (06/17/2015)  . Atrial fibrillation (HCC)   . CHF (congestive heart failure) (HCC)   . Chronic back pain   . Chronic kidney disease    02/17/16 Bun 34, creat 1.05  . Complication of anesthesia    "30 minutes violent shaking after a dental procedure"   . Degenerative disc disease, lumbar    . Diastolic heart failure (HCC)   . Gluten intolerance    . Heart murmur   . Hypertension   . Hypothyroidism    "from the aminodarone"  . Idiopathic scoliosis    . Osteoporosis   . Paroxysmal atrial fibrillation (HCC)   . Pleural effusion 2011   "hugh w/pneumonia"  . Pneumonia 2011; 11/2014  . Sinus bradycardia   . Stroke [facility])    TIA  . Thyroid disease   . TIA (transient ischemic attack) 2000's X 1  . Trigeminal neuralgia of left side of face 12/13/2014  . Unstable gait     Past Surgical History:  Procedure Laterality Date  . BI-VENTRICULAR IMPLANTABLE CARDIOVERTER DEFIBRILLATOR  (CRT-D)  06/17/2015  . CATARACT EXTRACTION W/ INTRAOCULAR LENS  IMPLANT, BILATERAL Bilateral 1991-1992  . EP IMPLANTABLE DEVICE N/A 06/17/2015   Procedure: Pacemaker Implant;  Surgeon: Duke Salvia, MD;  Location: [facility] INVASIVE CV LAB;  Service: Cardiovascular;  Laterality: N/A;  . LOOP RECORDER IMPLANT  08/2013   explanted 06/17/2015  . PACEMAKER INSERTION    . TOENAIL AVULSION Left    2nd digit  . TONSILLECTOMY  1944    Allergies  Allergen Reactions  . Codeine Other (See Comments)    unknown  . Codeine Nausea And Vomiting    & headaches  . Levaquin [Levofloxacin In D5w] Itching  . Levaquin [Levofloxacin In D5w] Swelling and Other (See Comments)    & redness  . Loratadine Other (See Comments)    TACHYCARDIA  . Xylocaine [Lidocaine Hcl] Other (See Comments)    Uncontrolled shaking  . Xylocaine [Lidocaine Hcl] Other (See Comments)    Made her very  cold & lots of shaking. Pt and daughter stated that she can take it.  . Loratadine Palpitations    Allergies as of 08/14/2017      Reactions   Codeine Other (See Comments)   unknown   Codeine Nausea And Vomiting   & headaches   Levaquin [levofloxacin In D5w] Itching   Levaquin [levofloxacin In D5w] Swelling, Other (See Comments)   & redness   Loratadine Other (See Comments)   TACHYCARDIA   Xylocaine [lidocaine Hcl] Other (See Comments)   Uncontrolled shaking   Xylocaine [lidocaine Hcl] Other (See Comments)   Made her very cold & lots of shaking. Pt and daughter stated that she can take it.   Loratadine Palpitations      Medication List       Accurate as of 08/14/17 11:59 PM. Always use your most recent med list.          acetaminophen 325 MG tablet Commonly known as:  TYLENOL Take 650 mg by mouth every 6 (six) hours as needed for mild pain or fever.   AMERIGEL CARE Lotn Apply 1 application topically as needed.   apixaban 2.5 MG Tabs tablet Commonly known as:  ELIQUIS Take 1 tablet (2.5 mg total) by mouth 2 (two) times daily.   artificial tears ointment Place into both eyes at bedtime.   erythromycin ophthalmic ointment Place 1 application into the right eye 2 (two) times daily. Stop date 08/09/17   feeding supplement Liqd Take by mouth 2 (two) times daily. Give 5 ounces   fexofenadine 180 MG tablet Commonly known as:  ALLEGRA Take 180 mg by mouth daily as needed.   levothyroxine 75 MCG tablet Commonly known as:  SYNTHROID, LEVOTHROID Take 1 tablet (75 mcg total) by mouth daily before breakfast. Reported on 02/04/2016   lisinopril 5 MG tablet Commonly known as:  PRINIVIL,ZESTRIL Take 1 tablet (5 mg total) by mouth daily.   magnesium hydroxide 400 MG/5ML suspension Commonly known as:  MILK OF MAGNESIA Take 30 mLs by mouth as needed for mild constipation.   metoprolol tartrate 25 MG tablet Commonly known as:  LOPRESSOR Take 1 tablet (25 mg total) by mouth  2 (two) times daily.   risperiDONE 0.25 MG tablet Commonly known as:  RISPERDAL Take 0.25 mg by mouth at bedtime.       Review of Systems  Constitutional: Negative for activity change, appetite change, chills, fatigue and fever.  HENT: Positive for hearing loss. Negative for congestion, rhinorrhea, sinus pain, sinus pressure, sneezing and sore throat.   Eyes: Negative for pain, discharge, redness and itching.       Dry eye per HPI   Respiratory: Negative for cough, chest tightness, shortness of breath and wheezing.   Cardiovascular: Positive for leg swelling. Negative for chest pain and palpitations.  Gastrointestinal: Negative for abdominal distention, abdominal pain, constipation, diarrhea, nausea and vomiting.  Genitourinary: Negative for dysuria, flank pain, frequency and urgency.  Skin: Negative for color change, pallor  and rash.  Neurological: Negative for dizziness, seizures, light-headedness and headaches.  Psychiatric/Behavioral: Positive for confusion. Negative for agitation, hallucinations and sleep disturbance. The patient is nervous/anxious.     Immunization History  Administered Date(s) Administered  . Influenza-Unspecified 10/08/2014, 09/23/2015, 10/05/2016  . PPD Test 06/22/2014, 06/11/2015, 01/11/2016  . Td 03/05/2014   Pertinent  Health Maintenance Due  Topic Date Due  . DEXA SCAN  01/18/1986  . PNA vac Low Risk Adult (1 of 2 - PCV13) 01/18/1986  . INFLUENZA VACCINE  07/25/2017   Fall Risk  02/18/2016 04/24/2015  Falls in the past year? No Yes  Number falls in past yr: - 2 or more  Injury with Fall? - Yes  Risk Factor Category  - High Fall Risk  Risk for fall due to : - History of fall(s);Impaired balance/gait  Follow up - Falls evaluation completed    Vitals:   08/14/17 1543  BP: (!) 145/65  Pulse: 60  Resp: 20  Temp: (!) 97 F (36.1 C)  SpO2: 97%  Weight: 99 lb 3.2 oz (45 kg)  Height: 4\' 7"  (1.397 m)   Body mass index is 23.06 kg/m. Physical  Exam  Constitutional: She is oriented to person, place, and time. She appears well-developed and well-nourished. No distress.  Elderly  HENT:  Head: Normocephalic.  Mouth/Throat: Oropharynx is clear and moist. No oropharyngeal exudate.  Eyes: Pupils are equal, round, and reactive to light. Conjunctivae and EOM are normal. Right eye exhibits no discharge. Left eye exhibits no discharge. No scleral icterus.  Neck: Normal range of motion. No JVD present. No thyromegaly present.  Cardiovascular: Intact distal pulses.  Exam reveals no gallop and no friction rub.   Murmur heard. Pace maker   Pulmonary/Chest: Effort normal and breath sounds normal. No respiratory distress. She has no wheezes. She has no rales.  Abdominal: Soft. Bowel sounds are normal. She exhibits no distension. There is no tenderness. There is no rebound and no guarding.  Musculoskeletal: She exhibits no tenderness.  Unsteady gait uses walker with seat. Arthritic changes to fingers noted. Bilateral lower extremities 2+ Edema awaiting ted hose.   Lymphadenopathy:    She has no cervical adenopathy.  Neurological: She is oriented to person, place, and time.  Unsteady gait   Skin: Skin is warm and dry. No rash noted. No erythema. No pallor.  Psychiatric: She has a normal mood and affect.    Labs reviewed:  Recent Labs  03/24/17 1429  04/02/17 2027 04/04/17 0334 04/05/17 0415 04/05/17 1159 07/06/17 07/26/17  NA  --   < > 133* 134* 136  --  134* 136*  K  --   < > 4.6 4.5 5.3* 4.0 4.5 4.6  CL  --   < > 102 105 107  --   --   --   CO2  --   < > 24 23 23   --   --   --   GLUCOSE  --   < > 107* 84 67  --   --   --   BUN  --   < > 14 12 12   --   --  24*  CREATININE  --   < > 0.65 0.61 0.62  --  0.7 0.7  CALCIUM  --   < > 8.9 8.1* 8.3*  --   --   --   MG 1.9  --   --   --   --   --   --   --  PHOS 3.6  --   --   --   --   --   --   --   < > = values in this interval not displayed.  Recent Labs  12/03/16 1053  03/24/17 1138 04/02/17 2027 07/06/17 07/26/17  AST 37 30 27 22 22   ALT 21 16 20 17 16   ALKPHOS 91 64 68 68 62  BILITOT 0.4 0.6 0.7  --   --   PROT 5.9* 5.7* 6.2*  --   --   ALBUMIN 3.4* 3.4* 3.8  --   --     Recent Labs  12/03/16 1053  04/02/17 2027 04/04/17 0334 04/05/17 0415 07/06/17 07/26/17  WBC 8.5  < > 8.2 6.9 9.5 6.5 6.6  NEUTROABS 6.3  --   --   --   --   --   --   HGB 12.0  < > 12.4 11.2* 12.2 9.9* 10.3*  HCT 35.2*  < > 35.4* 32.7* 35.2* 29* 31*  MCV 92.9  < > 90.8 94.5 94.4  --   --   PLT 214  < > 280 231 220 246 207  < > = values in this interval not displayed. Lab Results  Component Value Date   TSH 1.00 07/06/2017   Significant Diagnostic Results in last 30 days:  No results found.  Assessment/Plan 1. Mixed obsessional thoughts and acts Worries a lot per daughter then tends to get obsessed. Sertraline 12.5 mg Tablet recently initiated by MD on 08/06/2017. Daughter would like medication increased. Increase sertraline to 25 mg tablet one by mouth daily. Continue to monitor.   2. Bilateral dry eyes Morning dose eye ointment affects vision. Daughter request ointment to be applied at bedtime only. Change refresh ointment to one application to both eyes at bedtime.  Family/ staff Communication: Reviewed plan of care with patient,patient's daughter and facility Nurse supervisor.  Labs/tests ordered: None   Lauris Keepers C Adell Koval, NP

## 2017-08-15 NOTE — Patient Instructions (Signed)
[name] , Thank you for taking time to come for your Medicare Wellness Visit. I appreciate your ongoing commitment to your health goals. Please review the following plan we discussed and let me know if I can assist you in the future.   Screening recommendations/referrals: Colonoscopy excluded, pt over age 81 Mammogram excluded, pt over age 110 Bone Density due Recommended yearly ophthalmology/optometry visit for glaucoma screening and checkup Recommended yearly dental visit for hygiene and checkup  Vaccinations: Influenza vaccine due 2018 fall season Pneumococcal vaccine 13 due Tdap vaccine up to date. Due 03/05/24 Shingles vaccine not in records  Advanced directives: In Chart  Conditions/risks identified: None  Next appointment: Dr. Glade Lloyd makes rounds   Preventive Care 67 Years and Older, Female Preventive care refers to lifestyle choices and visits with your health care provider that can promote health and wellness. What does preventive care include?  A yearly physical exam. This is also called an annual well check.  Dental exams once or twice a year.  Routine eye exams. Ask your health care provider how often you should have your eyes checked.  Personal lifestyle choices, including:  Daily care of your teeth and gums.  Regular physical activity.  Eating a healthy diet.  Avoiding tobacco and drug use.  Limiting alcohol use.  Practicing safe sex.  Taking low-dose aspirin every day.  Taking vitamin and mineral supplements as recommended by your health care provider. What happens during an annual well check? The services and screenings done by your health care provider during your annual well check will depend on your age, overall health, lifestyle risk factors, and family history of disease. Counseling  Your health care provider may ask you questions about your:  Alcohol use.  Tobacco use.  Drug use.  Emotional well-being.  Home and relationship  well-being.  Sexual activity.  Eating habits.  History of falls.  Memory and ability to understand (cognition).  Work and work Astronomer.  Reproductive health. Screening  You may have the following tests or measurements:  Height, weight, and BMI.  Blood pressure.  Lipid and cholesterol levels. These may be checked every 5 years, or more frequently if you are over 81 years old.  Skin check.  Lung cancer screening. You may have this screening every year starting at age 78 if you have a 30-pack-year history of smoking and currently smoke or have quit within the past 15 years.  Fecal occult blood test (FOBT) of the stool. You may have this test every year starting at age 37.  Flexible sigmoidoscopy or colonoscopy. You may have a sigmoidoscopy every 5 years or a colonoscopy every 10 years starting at age 79.  Hepatitis C blood test.  Hepatitis B blood test.  Sexually transmitted disease (STD) testing.  Diabetes screening. This is done by checking your blood sugar (glucose) after you have not eaten for a while (fasting). You may have this done every 1-3 years.  Bone density scan. This is done to screen for osteoporosis. You may have this done starting at age 63.  Mammogram. This may be done every 1-2 years. Talk to your health care provider about how often you should have regular mammograms. Talk with your health care provider about your test results, treatment options, and if necessary, the need for more tests. Vaccines  Your health care provider may recommend certain vaccines, such as:  Influenza vaccine. This is recommended every year.  Tetanus, diphtheria, and acellular pertussis (Tdap, Td) vaccine. You may need a Td booster  every 10 years.  Zoster vaccine. You may need this after age 22.  Pneumococcal 13-valent conjugate (PCV13) vaccine. One dose is recommended after age 14.  Pneumococcal polysaccharide (PPSV23) vaccine. One dose is recommended after age  75. Talk to your health care provider about which screenings and vaccines you need and how often you need them. This information is not intended to replace advice given to you by your health care provider. Make sure you discuss any questions you have with your health care provider. Document Released: 01/07/2016 Document Revised: 08/30/2016 Document Reviewed: 10/12/2015 Elsevier Interactive Patient Education  2017 [place] Prevention in the Home Falls can cause injuries. They can happen to people of all ages. There are many things you can do to make your home safe and to help prevent falls. What can I do on the outside of my home?  Regularly fix the edges of walkways and driveways and fix any cracks.  Remove anything that might make you trip as you walk through a door, such as a raised step or threshold.  Trim any bushes or trees on the path to your home.  Use bright outdoor lighting.  Clear any walking paths of anything that might make someone trip, such as rocks or tools.  Regularly check to see if handrails are loose or broken. Make sure that both sides of any steps have handrails.  Any raised decks and porches should have guardrails on the edges.  Have any leaves, snow, or ice cleared regularly.  Use sand or salt on walking paths during winter.  Clean up any spills in your garage right away. This includes oil or grease spills. What can I do in the bathroom?  Use night lights.  Install grab bars by the toilet and in the tub and shower. Do not use towel bars as grab bars.  Use non-skid mats or decals in the tub or shower.  If you need to sit down in the shower, use a plastic, non-slip stool.  Keep the floor dry. Clean up any water that spills on the floor as soon as it happens.  Remove soap buildup in the tub or shower regularly.  Attach bath mats securely with double-sided non-slip rug tape.  Do not have throw rugs and other things on the floor that can make  you trip. What can I do in the bedroom?  Use night lights.  Make sure that you have a light by your bed that is easy to reach.  Do not use any sheets or blankets that are too big for your bed. They should not hang down onto the floor.  Have a firm chair that has side arms. You can use this for support while you get dressed.  Do not have throw rugs and other things on the floor that can make you trip. What can I do in the kitchen?  Clean up any spills right away.  Avoid walking on wet floors.  Keep items that you use a lot in easy-to-reach places.  If you need to reach something above you, use a strong step stool that has a grab bar.  Keep electrical cords out of the way.  Do not use floor polish or wax that makes floors slippery. If you must use wax, use non-skid floor wax.  Do not have throw rugs and other things on the floor that can make you trip. What can I do with my stairs?  Do not leave any items on the stairs.  Make  sure that there are handrails on both sides of the stairs and use them. Fix handrails that are broken or loose. Make sure that handrails are as long as the stairways.  Check any carpeting to make sure that it is firmly attached to the stairs. Fix any carpet that is loose or worn.  Avoid having throw rugs at the top or bottom of the stairs. If you do have throw rugs, attach them to the floor with carpet tape.  Make sure that you have a light switch at the top of the stairs and the bottom of the stairs. If you do not have them, ask someone to add them for you. What else can I do to help prevent falls?  Wear shoes that:  Do not have high heels.  Have rubber bottoms.  Are comfortable and fit you well.  Are closed at the toe. Do not wear sandals.  If you use a stepladder:  Make sure that it is fully opened. Do not climb a closed stepladder.  Make sure that both sides of the stepladder are locked into place.  Ask someone to hold it for you, if  possible.  Clearly mark and make sure that you can see:  Any grab bars or handrails.  First and last steps.  Where the edge of each step is.  Use tools that help you move around (mobility aids) if they are needed. These include:  Canes.  Walkers.  Scooters.  Crutches.  Turn on the lights when you go into a dark area. Replace any light bulbs as soon as they burn out.  Set up your furniture so you have a clear path. Avoid moving your furniture around.  If any of your floors are uneven, fix them.  If there are any pets around you, be aware of where they are.  Review your medicines with your doctor. Some medicines can make you feel dizzy. This can increase your chance of falling. Ask your doctor what other things that you can do to help prevent falls. This information is not intended to replace advice given to you by your health care provider. Make sure you discuss any questions you have with your health care provider. Document Released: 10/07/2009 Document Revised: 05/18/2016 Document Reviewed: 01/15/2015 Elsevier Interactive Patient Education  2017 [employer].

## 2017-08-21 ENCOUNTER — Non-Acute Institutional Stay: Payer: Medicare PPO | Admitting: Family

## 2017-08-21 ENCOUNTER — Encounter: Payer: Self-pay | Admitting: Family

## 2017-08-21 DIAGNOSIS — R251 Tremor, unspecified: Secondary | ICD-10-CM | POA: Diagnosis not present

## 2017-08-21 DIAGNOSIS — R531 Weakness: Secondary | ICD-10-CM

## 2017-08-21 LAB — HEPATIC FUNCTION PANEL
ALT: 19 (ref 7–35)
AST: 24 (ref 13–35)
Alkaline Phosphatase: 50 (ref 25–125)
BILIRUBIN, TOTAL: 0.5

## 2017-08-21 LAB — BASIC METABOLIC PANEL
BUN: 22 — AB (ref 4–21)
Creatinine: 0.8 (ref 0.5–1.1)
Glucose: 115
POTASSIUM: 4.5 (ref 3.4–5.3)
Sodium: 130 — AB (ref 137–147)

## 2017-08-21 LAB — CBC AND DIFFERENTIAL
HEMATOCRIT: 32 — AB (ref 36–46)
HEMOGLOBIN: 10.9 — AB (ref 12.0–16.0)
PLATELETS: 211 (ref 150–399)
WBC: 8.1

## 2017-08-21 NOTE — Progress Notes (Signed)
Location:  [facility] Room Number: 1 Place of Service:  ALF [phone]) Provider: Dinah Ngetich FNP-C  Oneal Grout, MD  Patient Care Team: Oneal Grout, MD as PCP - General (Internal Medicine) Duke Salvia, MD as Consulting Physician (Cardiology) Corky Crafts, MD as Consulting Physician (Interventional Cardiology) Sheral Apley, MD as Attending Physician (Orthopedic Surgery)  Extended Emergency Contact Information Primary Emergency Contact: McNeill,Dr. Robin Searing [place] Home Phone: [phone] Work Phone: [phone] Mobile Phone: [phone] Relation: Daughter  Code Status: DNR Goals of care: Advanced Directive information Advanced Directives 08/21/2017  Does Patient Have a Medical Advance Directive? Yes  Type of Advance Directive Out of facility DNR (pink MOST or yellow form);Living will  Does patient want to make changes to medical advance directive? -  Copy of Healthcare Power of Attorney in Chart? -  Would patient like information on creating a medical advance directive? -  Pre-existing out of facility DNR order (yellow form or pink MOST form) Yellow form placed in chart (order not valid for inpatient use)     Chief Complaint  Patient presents with  . Acute Visit    shaky     HPI:  Pt is a 83 y.o. female seen today at [facility] for an acute visit for evaluation of worsening weakness and shakiness. She is seen in her room per facility nurse request.Nurse reports patient ambulated this morning to the whirl pool but required assistance getting in and out but usually able to get in by herself. She also required assistance back to her room.she also complained of feeling intermittent shakiness and feeling of cold. Nurse reports patient eat snacks and her meals. No signs of hypoglycemia reported. Patient states just feels cold. She also complains of strong paint odor since facility hallway is being painted.she denies  any fever, chills,cough,shortness of breath or wheezing.    Past Medical History:  Diagnosis Date  . Acute encephalopathy 03/24/2017  . Acute on chronic diastolic heart failure (HCC)   . AICD (automatic cardioverter/defibrillator) present   . Arthritis    "plenty" (06/17/2015)  . Atrial fibrillation (HCC)   . CHF (congestive heart failure) (HCC)   . Chronic back pain   . Chronic kidney disease    02/17/16 Bun 34, creat 1.05  . Complication of anesthesia    "30 minutes violent shaking after a dental procedure"   . Degenerative disc disease, lumbar    . Diastolic heart failure (HCC)   . Gluten intolerance    . Heart murmur   . Hypertension   . Hypothyroidism    "from the aminodarone"  . Idiopathic scoliosis    . Osteoporosis   . Paroxysmal atrial fibrillation (HCC)   . Pleural effusion 2011   "hugh w/pneumonia"  . Pneumonia 2011; 11/2014  . Sinus bradycardia   . Stroke [facility])    TIA  . Thyroid disease   . TIA (transient ischemic attack) 2000's X 1  . Trigeminal neuralgia of left side of face 12/13/2014  . Unstable gait     Past Surgical History:  Procedure Laterality Date  . BI-VENTRICULAR IMPLANTABLE CARDIOVERTER DEFIBRILLATOR  (CRT-D)  06/17/2015  . CATARACT EXTRACTION W/ INTRAOCULAR LENS  IMPLANT, BILATERAL Bilateral 1991-1992  . EP IMPLANTABLE DEVICE N/A 06/17/2015   Procedure: Pacemaker Implant;  Surgeon: Duke Salvia, MD;  Location: [facility] INVASIVE CV LAB;  Service: Cardiovascular;  Laterality: N/A;  . LOOP RECORDER IMPLANT  08/2013   explanted 06/17/2015  . PACEMAKER  INSERTION    . TOENAIL AVULSION Left    2nd digit  . TONSILLECTOMY  1944    Allergies  Allergen Reactions  . Codeine Other (See Comments)    unknown  . Codeine Nausea And Vomiting    & headaches  . Levaquin [Levofloxacin In D5w] Itching  . Levaquin [Levofloxacin In D5w] Swelling and Other (See Comments)    & redness  . Loratadine Other (See Comments)    TACHYCARDIA  . Xylocaine [Lidocaine Hcl]  Other (See Comments)    Uncontrolled shaking  . Xylocaine [Lidocaine Hcl] Other (See Comments)    Made her very cold & lots of shaking. Pt and daughter stated that she can take it.  . Loratadine Palpitations    Allergies as of 08/21/2017      Reactions   Codeine Other (See Comments)   unknown   Codeine Nausea And Vomiting   & headaches   Levaquin [levofloxacin In D5w] Itching   Levaquin [levofloxacin In D5w] Swelling, Other (See Comments)   & redness   Loratadine Other (See Comments)   TACHYCARDIA   Xylocaine [lidocaine Hcl] Other (See Comments)   Uncontrolled shaking   Xylocaine [lidocaine Hcl] Other (See Comments)   Made her very cold & lots of shaking. Pt and daughter stated that she can take it.   Loratadine Palpitations      Medication List       Accurate as of 08/21/17  5:04 PM. Always use your most recent med list.          acetaminophen 325 MG tablet Commonly known as:  TYLENOL Take 650 mg by mouth every 6 (six) hours as needed for mild pain or fever.   AMERIGEL CARE Lotn Apply 1 application topically as needed.   apixaban 2.5 MG Tabs tablet Commonly known as:  ELIQUIS Take 1 tablet (2.5 mg total) by mouth 2 (two) times daily.   artificial tears ointment Place into both eyes at bedtime.   erythromycin ophthalmic ointment Place 1 application into the right eye 2 (two) times daily. Stop date 08/09/17   fexofenadine 180 MG tablet Commonly known as:  ALLEGRA Take 180 mg by mouth daily as needed.   levothyroxine 75 MCG tablet Commonly known as:  SYNTHROID, LEVOTHROID Take 1 tablet (75 mcg total) by mouth daily before breakfast. Reported on 02/04/2016   lisinopril 5 MG tablet Commonly known as:  PRINIVIL,ZESTRIL Take 1 tablet (5 mg total) by mouth daily.   magnesium hydroxide 400 MG/5ML suspension Commonly known as:  MILK OF MAGNESIA Take 30 mLs by mouth as needed for mild constipation.   metoprolol tartrate 25 MG tablet Commonly known as:   LOPRESSOR Take 1 tablet (25 mg total) by mouth 2 (two) times daily.   risperiDONE 0.25 MG tablet Commonly known as:  RISPERDAL Take 0.25 mg by mouth at bedtime.   sertraline 25 MG tablet Commonly known as:  ZOLOFT Take 25 mg by mouth daily.       Review of Systems  Constitutional: Positive for activity change. Negative for appetite change, chills, diaphoresis, fatigue and fever.  HENT: Positive for hearing loss. Negative for congestion, rhinorrhea, sinus pain, sinus pressure, sneezing and sore throat.   Eyes: Negative for pain, discharge, redness and itching.  Respiratory: Negative for cough, chest tightness, shortness of breath and wheezing.   Cardiovascular: Positive for leg swelling. Negative for chest pain and palpitations.  Gastrointestinal: Negative for abdominal distention, abdominal pain, constipation, diarrhea, nausea and vomiting.  Endocrine: Negative for cold  intolerance, heat intolerance, polydipsia, polyphagia and polyuria.  Genitourinary: Negative for dysuria, flank pain, frequency and urgency.  Musculoskeletal: Positive for gait problem.       Right upper arm chronic pain   Skin: Negative for color change, pallor, rash and wound.  Neurological: Negative for dizziness, seizures, syncope, weakness, light-headedness and headaches.       Shaky   Psychiatric/Behavioral: Negative for agitation, confusion, hallucinations and sleep disturbance. The patient is not nervous/anxious.     Immunization History  Administered Date(s) Administered  . Influenza-Unspecified 10/08/2014, 09/23/2015, 10/05/2016  . PPD Test 06/22/2014, 06/11/2015, 01/11/2016  . Pneumococcal Polysaccharide-23 08/21/2017  . Td 03/05/2014   Pertinent  Health Maintenance Due  Topic Date Due  . DEXA SCAN  01/18/1986  . PNA vac Low Risk Adult (1 of 2 - PCV13) 01/18/1986  . INFLUENZA VACCINE  07/25/2017   Fall Risk  08/15/2017 02/18/2016 04/24/2015  Falls in the past year? No No Yes  Number falls in past  yr: - - 2 or more  Injury with Fall? - - Yes  Risk Factor Category  - - High Fall Risk  Risk for fall due to : - - History of fall(s);Impaired balance/gait  Follow up - - Falls evaluation completed    Vitals:   08/21/17 1610  BP: (!) 114/53  Pulse: 80  Resp: 14  Temp: (!) 97.2 F (36.2 C)  SpO2: 96%  Weight: 99 lb (44.9 kg)  Height: 4\' 7"  (1.397 m)   Body mass index is 23.01 kg/m. Physical Exam  Constitutional:  Thin frail elderly in no acute distress   HENT:  Head: Normocephalic.  Right Ear: External ear normal.  Left Ear: External ear normal.  Mouth/Throat: Oropharynx is clear and moist. No oropharyngeal exudate.  Eyes: Pupils are equal, round, and reactive to light. Conjunctivae and EOM are normal. Right eye exhibits no discharge. Left eye exhibits no discharge. No scleral icterus.  Neck: Normal range of motion. No JVD present. No thyromegaly present.  Cardiovascular: Intact distal pulses.  Exam reveals no gallop and no friction rub.   Murmur heard. Pacemaker   Pulmonary/Chest: Effort normal and breath sounds normal. No respiratory distress. She has no wheezes. She has no rales. She exhibits no tenderness.  Abdominal: Soft. Bowel sounds are normal. She exhibits no distension. There is no tenderness. There is no rebound and no guarding.  Musculoskeletal:  Moves x 4 extremities. Right arm limited ROM due to chronic pain. Unsteady gait uses walker with seat. Arthritic changes to fingers noted. Bilateral lower extremities 2+ edema Ted hose not in place.   Lymphadenopathy:    She has no cervical adenopathy.  Neurological: Coordination normal.  Alert to person and place.Unsteady.Intermittent jerking of hands that seems to be intentional patient tends to forget whenever she is distracted with something else. Patient was completing her menu when provider walked in but when asked how she was feeling she started to have intermittent "shaky".     Skin: Skin is warm and dry. No rash  noted. No erythema. No pallor.  Psychiatric: She has a normal mood and affect.    Labs reviewed:  Recent Labs  03/24/17 1429  04/02/17 2027 04/04/17 0334 04/05/17 0415 04/05/17 1159 07/06/17 07/26/17  NA  --   < > 133* 134* 136  --  134* 136*  K  --   < > 4.6 4.5 5.3* 4.0 4.5 4.6  CL  --   < > 102 105 107  --   --   --  CO2  --   < > 24 23 23   --   --   --   GLUCOSE  --   < > 107* 84 67  --   --   --   BUN  --   < > 14 12 12   --   --  24*  CREATININE  --   < > 0.65 0.61 0.62  --  0.7 0.7  CALCIUM  --   < > 8.9 8.1* 8.3*  --   --   --   MG 1.9  --   --   --   --   --   --   --   PHOS 3.6  --   --   --   --   --   --   --   < > = values in this interval not displayed.  Recent Labs  12/03/16 1053 03/24/17 1138 04/02/17 2027 07/06/17 07/26/17  AST 37 30 27 22 22   ALT 21 16 20 17 16   ALKPHOS 91 64 68 68 62  BILITOT 0.4 0.6 0.7  --   --   PROT 5.9* 5.7* 6.2*  --   --   ALBUMIN 3.4* 3.4* 3.8  --   --     Recent Labs  12/03/16 1053  04/02/17 2027 04/04/17 0334 04/05/17 0415 07/06/17 07/26/17  WBC 8.5  < > 8.2 6.9 9.5 6.5 6.6  NEUTROABS 6.3  --   --   --   --   --   --   HGB 12.0  < > 12.4 11.2* 12.2 9.9* 10.3*  HCT 35.2*  < > 35.4* 32.7* 35.2* 29* 31*  MCV 92.9  < > 90.8 94.5 94.4  --   --   PLT 214  < > 280 231 220 246 207  < > = values in this interval not displayed. Lab Results  Component Value Date   TSH 1.00 07/06/2017   Significant Diagnostic Results in last 30 days:  No results found.  Assessment/Plan 1. Generalized weakness Has required more assistance with care today.Vital signs stable. Exam finding negative. Will continue with therapy. If continues to progressive decline will need to consider transfer to skilled nursing for close monitoring and assistance with ADL's.    2. Shaky Afebrile. VSS. Appears to be intermittent intentional shakiness. Will monitor vital signs every shift for now.Draw stat CBC/diff, CMP and Mg level.    Family/ staff  Communication: Reviewed plan of care with patient and facility Nurse supervisor  Labs/tests ordered: stat CBC/diff, CMP and Mg level.  Caesar Bookman, NP

## 2017-08-22 ENCOUNTER — Telehealth: Payer: Self-pay | Admitting: Family

## 2017-08-22 NOTE — Telephone Encounter (Signed)
Patient's daughter Gweneth DimitriWendy McNeill MD called facility Nurse request Dr.Pandey to call her back.Dr.Pandey in with patient in the clinic requested NP to return phone call. Phone returned no answer. Voicemail left for patient's daughter to call NP.Later called scheduled phone call time at 2:45pm. Phone call returned with ALF Nurse supervisor present. Patient's daughter request Risperdal frequency to be changed to twice daily if she continues to have behavioral issues. Also states patient has upcoming eye surgical procedure to expect surgery clearance form but MD/NP to make clinical decision about patient undergoing procedure.she will be fine with whatever decision that is made.

## 2017-08-29 ENCOUNTER — Other Ambulatory Visit: Payer: Self-pay | Admitting: *Deleted

## 2017-09-05 ENCOUNTER — Non-Acute Institutional Stay (SKILLED_NURSING_FACILITY): Payer: Medicare PPO | Admitting: Internal Medicine

## 2017-09-05 ENCOUNTER — Encounter: Payer: Self-pay | Admitting: Internal Medicine

## 2017-09-05 DIAGNOSIS — M545 Low back pain, unspecified: Secondary | ICD-10-CM

## 2017-09-05 DIAGNOSIS — E440 Moderate protein-calorie malnutrition: Secondary | ICD-10-CM | POA: Diagnosis not present

## 2017-09-05 DIAGNOSIS — I5032 Chronic diastolic (congestive) heart failure: Secondary | ICD-10-CM | POA: Diagnosis not present

## 2017-09-05 DIAGNOSIS — F22 Delusional disorders: Secondary | ICD-10-CM | POA: Diagnosis not present

## 2017-09-05 DIAGNOSIS — E871 Hypo-osmolality and hyponatremia: Secondary | ICD-10-CM | POA: Diagnosis not present

## 2017-09-05 DIAGNOSIS — R531 Weakness: Secondary | ICD-10-CM

## 2017-09-05 DIAGNOSIS — R2681 Unsteadiness on feet: Secondary | ICD-10-CM

## 2017-09-05 DIAGNOSIS — J309 Allergic rhinitis, unspecified: Secondary | ICD-10-CM | POA: Diagnosis not present

## 2017-09-05 DIAGNOSIS — I10 Essential (primary) hypertension: Secondary | ICD-10-CM

## 2017-09-05 DIAGNOSIS — N182 Chronic kidney disease, stage 2 (mild): Secondary | ICD-10-CM

## 2017-09-05 DIAGNOSIS — I350 Nonrheumatic aortic (valve) stenosis: Secondary | ICD-10-CM

## 2017-09-05 DIAGNOSIS — I480 Paroxysmal atrial fibrillation: Secondary | ICD-10-CM

## 2017-09-05 DIAGNOSIS — G8929 Other chronic pain: Secondary | ICD-10-CM | POA: Insufficient documentation

## 2017-09-05 DIAGNOSIS — M1990 Unspecified osteoarthritis, unspecified site: Secondary | ICD-10-CM | POA: Diagnosis not present

## 2017-09-05 DIAGNOSIS — E039 Hypothyroidism, unspecified: Secondary | ICD-10-CM

## 2017-09-05 DIAGNOSIS — R638 Other symptoms and signs concerning food and fluid intake: Secondary | ICD-10-CM

## 2017-09-05 NOTE — Progress Notes (Signed)
Provider:  Oneal Grout MD  Location:  [facility] Room Number: N-6 Place of Service:  SNF (31)  PCP: Oneal Grout, MD Patient Care Team: Oneal Grout, MD as PCP - General (Internal Medicine) Duke Salvia, MD as Consulting Physician (Cardiology) Corky Crafts, MD as Consulting Physician (Interventional Cardiology) Sheral Apley, MD as Attending Physician (Orthopedic Surgery)  Extended Emergency Contact Information Primary Emergency Contact: McNeill,Dr. Robin Searing [place] Home Phone: [phone] Work Phone: [phone] Mobile Phone: [phone] Relation: Daughter  Code Status: DNR Goals of Care: Advanced Directive information Advanced Directives 09/05/2017  Does Patient Have a Medical Advance Directive? Yes  Type of Advance Directive Living will;Out of facility DNR (pink MOST or yellow form)  Does patient want to make changes to medical advance directive? No - Patient declined  Copy of Healthcare Power of Attorney in Chart? -  Would patient like information on creating a medical advance directive? -  Pre-existing out of facility DNR order (yellow form or pink MOST form) Yellow form placed in chart (order not valid for inpatient use)      Chief Complaint  Patient presents with  . New Admit To SNF    New Admission Visit     HPI: Patient is a 83 y.o. female seen today for admission visit. She has been admitted to skilled nursing facility for long term care with her increased need for assistance with ADLs. She is seen in her room today. She now needs 1-2 person assistance with transfer. No fall has been reported. She needs assistance with feeding, toileting and dressing. She is taking her medication whole for now. She feels tired this morning. She has poor oral intake.   Past Medical History:  Diagnosis Date  . Acute encephalopathy 03/24/2017  . Acute on chronic diastolic heart failure (HCC)   . AICD (automatic  cardioverter/defibrillator) present   . Arthritis    "plenty" (06/17/2015)  . Atrial fibrillation (HCC)   . CHF (congestive heart failure) (HCC)   . Chronic back pain   . Chronic kidney disease    02/17/16 Bun 34, creat 1.05  . Complication of anesthesia    "30 minutes violent shaking after a dental procedure"   . Degenerative disc disease, lumbar    . Diastolic heart failure (HCC)   . Gluten intolerance    . Heart murmur   . Hypertension   . Hypothyroidism    "from the aminodarone"  . Idiopathic scoliosis    . Osteoporosis   . Paroxysmal atrial fibrillation (HCC)   . Pleural effusion 2011   "hugh w/pneumonia"  . Pneumonia 2011; 11/2014  . Sinus bradycardia   . Stroke [facility])    TIA  . Thyroid disease   . TIA (transient ischemic attack) 2000's X 1  . Trigeminal neuralgia of left side of face 12/13/2014  . Unstable gait     Past Surgical History:  Procedure Laterality Date  . BI-VENTRICULAR IMPLANTABLE CARDIOVERTER DEFIBRILLATOR  (CRT-D)  06/17/2015  . CATARACT EXTRACTION W/ INTRAOCULAR LENS  IMPLANT, BILATERAL Bilateral 1991-1992  . EP IMPLANTABLE DEVICE N/A 06/17/2015   Procedure: Pacemaker Implant;  Surgeon: Duke Salvia, MD;  Location: [facility] INVASIVE CV LAB;  Service: Cardiovascular;  Laterality: N/A;  . LOOP RECORDER IMPLANT  08/2013   explanted 06/17/2015  . PACEMAKER INSERTION    . TOENAIL AVULSION Left    2nd digit  . TONSILLECTOMY  1944    reports that she has never smoked. She  has never used smokeless tobacco. She reports that she does not drink alcohol or use drugs. Social History   Social History  . Marital status: Widowed    Spouse name: N/A  . Number of children: N/A  . Years of education: N/A   Occupational History  . Not on file.   Social History Main Topics  . Smoking status: Never Smoker  . Smokeless tobacco: Never Used  . Alcohol use No  . Drug use: No  . Sexual activity: No   Other Topics Concern  . Not on file   Social History Narrative    ** Merged History Encounter **        Functional Status Survey:    Family History  Problem Relation Age of Onset  . Stroke Mother   . Hypertension Father   . Heart attack Neg Hx     Health Maintenance  Topic Date Due  . DEXA SCAN  01/18/1986  . INFLUENZA VACCINE  07/25/2017  . PNA vac Low Risk Adult (2 of 2 - PCV13) 08/21/2018  . TETANUS/TDAP  03/05/2024    Allergies  Allergen Reactions  . Codeine Other (See Comments)    unknown  . Codeine Nausea And Vomiting    & headaches  . Levaquin [Levofloxacin In D5w] Itching  . Levaquin [Levofloxacin In D5w] Swelling and Other (See Comments)    & redness  . Loratadine Other (See Comments)    TACHYCARDIA  . Xylocaine [Lidocaine Hcl] Other (See Comments)    Uncontrolled shaking  . Xylocaine [Lidocaine Hcl] Other (See Comments)    Made her very cold & lots of shaking. Pt and daughter stated that she can take it.  . Loratadine Palpitations    Outpatient Encounter Prescriptions as of 09/05/2017  Medication Sig  . acetaminophen (TYLENOL) 325 MG tablet Take 650 mg by mouth every 6 (six) hours as needed for mild pain or fever.   Marland Kitchen apixaban (ELIQUIS) 2.5 MG TABS tablet Take 1 tablet (2.5 mg total) by mouth 2 (two) times daily.  Marland Kitchen erythromycin ophthalmic ointment Place 1 application into both eyes. Every 3 days at bedtime.  . fexofenadine (ALLEGRA) 180 MG tablet Take 180 mg by mouth daily as needed.   . Hypromellose, PF, (RETAINE HPMC) 0.3 % SOLN Apply 1 drop to eye every 6 (six) hours as needed (both eyes).  Marland Kitchen levothyroxine (SYNTHROID, LEVOTHROID) 75 MCG tablet Take 1 tablet (75 mcg total) by mouth daily before breakfast. Reported on 02/04/2016  . lisinopril (PRINIVIL,ZESTRIL) 5 MG tablet Take 1 tablet (5 mg total) by mouth daily.  . magnesium hydroxide (MILK OF MAGNESIA) 400 MG/5ML suspension Take 30 mLs by mouth daily as needed for mild constipation.   . metoprolol tartrate (LOPRESSOR) 25 MG tablet Take 1 tablet (25 mg total) by mouth  2 (two) times daily.  . NON FORMULARY Take 90 mLs by mouth 3 (three) times daily. Resource  . risperiDONE (RISPERDAL) 0.25 MG tablet Take 0.25 mg by mouth 2 (two) times daily.   . Skin Protectants, Misc. (AMERIGEL CARE) LOTN Apply 1 application topically daily as needed.   . [DISCONTINUED] Artificial Tear Ointment (ARTIFICIAL TEARS) ointment Place into both eyes at bedtime.  . [DISCONTINUED] sertraline (ZOLOFT) 25 MG tablet Take 25 mg by mouth daily.   No facility-administered encounter medications on file as of 09/05/2017.     Review of Systems  Constitutional: Positive for appetite change and fatigue. Negative for chills and fever.  HENT: Positive for hearing loss. Negative for congestion,  sore throat and trouble swallowing.   Eyes: Positive for visual disturbance.  Respiratory: Negative for cough, shortness of breath and wheezing.   Cardiovascular: Negative for chest pain and palpitations.  Gastrointestinal: Negative for abdominal pain, constipation, diarrhea, nausea and vomiting.       Last bowel movement was 2 days ago.   Genitourinary: Negative for dysuria.  Musculoskeletal: Positive for arthralgias and gait problem. Negative for back pain.  Skin: Negative for rash and wound.  Neurological: Negative for light-headedness, numbness and headaches.  Psychiatric/Behavioral: Positive for behavioral problems and confusion.    Vitals:   09/05/17 1117  BP: (!) 121/51  Pulse: 64  Temp: (!) 96.3 F (35.7 C)  TempSrc: Oral  SpO2: 94%  Weight: 99 lb (44.9 kg)  Height: 4\' 7"  (1.397 m)   Body mass index is 23.01 kg/m.   Wt Readings from Last 3 Encounters:  09/05/17 99 lb (44.9 kg)  08/21/17 99 lb (44.9 kg)  08/15/17 99 lb (44.9 kg)    Physical Exam  Constitutional: She is oriented to person, place, and time. No distress.  Thin built and frail elderly female patient  HENT:  Head: Normocephalic and atraumatic.  Mouth/Throat: Oropharynx is clear and moist.  Eyes: Pupils are  equal, round, and reactive to light. Conjunctivae are normal.  Neck: Normal range of motion. Neck supple.  Cardiovascular: Normal rate and regular rhythm.   Murmur heard. Pulmonary/Chest: Effort normal and breath sounds normal. No respiratory distress. She has no wheezes. She has no rales.  Abdominal: Soft. Bowel sounds are normal. There is no tenderness. There is no guarding.  Musculoskeletal: She exhibits edema.  Trace leg edema, generalized weakness, able to move all 4 extremities  Lymphadenopathy:    She has no cervical adenopathy.  Neurological: She is alert and oriented to person, place, and time.  Skin: Skin is warm and dry. She is not diaphoretic.  Psychiatric: She has a normal mood and affect.    Labs reviewed: Basic Metabolic Panel:  Recent Labs  09/81/1903/31/18 1429  04/02/17 2027 04/04/17 0334 04/05/17 0415  07/06/17 07/26/17 08/21/17  NA  --   < > 133* 134* 136  --  134* 136* 130*  K  --   < > 4.6 4.5 5.3*  < > 4.5 4.6 4.5  CL  --   < > 102 105 107  --   --   --   --   CO2  --   < > 24 23 23   --   --   --   --   GLUCOSE  --   < > 107* 84 67  --   --   --   --   BUN  --   < > 14 12 12   --   --  24* 22*  CREATININE  --   < > 0.65 0.61 0.62  --  0.7 0.7 0.8  CALCIUM  --   < > 8.9 8.1* 8.3*  --   --   --   --   MG 1.9  --   --   --   --   --   --   --   --   PHOS 3.6  --   --   --   --   --   --   --   --   < > = values in this interval not displayed. Liver Function Tests:  Recent Labs  12/03/16 1053 03/24/17 1138 04/02/17 2027 07/06/17 07/26/17  08/21/17  AST 37 ALT ALKPHOS 91 64 68 68 62 50  BILITOT 0.4 0.6 0.7  --   --   --   PROT 5.9* 5.7* 6.2*  --   --   --   ALBUMIN 3.4* 3.4* 3.8  --   --   --    No results for input(s): LIPASE, AMYLASE in the last 8760 hours. No results for input(s): AMMONIA in the last 8760 hours. CBC:  Recent Labs  12/03/16 1053  04/02/17 2027 04/04/17 0334 04/05/17 0415 07/06/17 07/26/17 08/21/17    WBC 8.5  < > 8.2 6.9 9.5 6.5 6.6 8.1  NEUTROABS 6.3  --   --   --   --   --   --   --   HGB 12.0  < > 12.4 11.2* 12.2 9.9* 10.3* 10.9*  HCT 35.2*  < > 35.4* 32.7* 35.2* 29* 31* 32*  MCV 92.9  < > 90.8 94.5 94.4  --   --   --   PLT 214  < > 280 231 220 246 207 211  < > = values in this interval not displayed. Cardiac Enzymes:  Recent Labs  12/03/16 1053  TROPONINI <0.03   BNP: Invalid input(s): POCBNP No results found for: HGBA1C Lab Results  Component Value Date   TSH 1.00 07/06/2017   Lab Results  Component Value Date   VITAMINB12 927 (H) 03/24/2017   No results found for: FOLATE No results found for: IRON, TIBC, FERRITIN  Imaging and Procedures obtained prior to SNF admission: Ct Head Wo Contrast  Result Date: 04/02/2017 CLINICAL DATA:  Dementia, altered mental status. follow-up left middle ear effusion. EXAM: CT HEAD WITHOUT CONTRAST CT TEMPORAL BONES WITH CONTRAST TECHNIQUE: Contiguous axial images were obtained from the base of the skull through the vertex without contrast. Multidetector CT imaging of the temporal bones was performed using the standard protocol with intravenous contrast. CONTRAST:  75 CC ISOVUE 300 COMPARISON:  CT HEAD March 24, 2017 FINDINGS: CT HEAD FINDINGS BRAIN: No intraparenchymal hemorrhage, mass effect nor midline shift. The ventricles and sulci are normal for age. Patchy supratentorial white matter hypodensities within normal range for patient's age, though non-specific are most compatible with chronic small vessel ischemic disease. No acute large vascular territory infarcts. No abnormal extra-axial fluid collections. Basal cisterns are patent. VASCULAR: Moderate calcific atherosclerosis of the carotid siphons. SKULL: No skull fracture. No significant scalp soft tissue swelling. SINUSES/ORBITS: The paranasal sinuses are well-aerated.The included ocular globes and orbital contents are non-suspicious. Status post bilateral ocular lens implants. OTHER:  None. CT TEMPORAL BONES FINDINGS- moderately motion degraded examination. RIGHT: External auditory canal is well formed and well aerated. Tympanic membrane is not thickened or retracted. The scutum is sharp. Well aerated middle ear including Prussak's space. Ossicles are well formed and located. Patent aditus ad antrum. Well aerated mastoid air cells without coalescence. Intact tegmen tympani. Intact otic capsule with normal appearance of the inner ear structures. Please note, due to patient motion subtle areas of osteolysis would not be detected. No internal auditory canal expansion. No definite cerebellar pontine angle masses. LEFT: External auditory canal is well formed and aerated. Tympanic membrane may be retracted though limited assessment due to motion. The scutum is sharp. Soft tissue throughout the middle ear including Prussak's space Ossicles are well formed and located. Patent aditus ad antrum. Soft tissue opacifies mastoid air cells without coalescence. Intact tegmen tympani.  Intact otic capsule with normal appearance of the inner ear structures. Please note, due to patient motion subtle areas of osteolysis would not be detected. No internal auditory canal expansion. No definite cerebellar pontine angle masses. No obstructing nasopharyngeal mass. No dural venous sinus thrombosis a an included field-of-view. IMPRESSION: CT HEAD:  Stable negative CT HEAD for age. CT TEMPORAL BONES:  Moderately motion degraded examination. LEFT middle ear and mastoid effusion compatible with Eustation tube dysfunction without CT findings of mastoiditis or abscess. Electronically Signed   By: Awilda Metro M.D.   On: 04/02/2017 23:03   Ct Temporal Bones W Contrast  Result Date: 04/02/2017 CLINICAL DATA:  Dementia, altered mental status. follow-up left middle ear effusion. EXAM: CT HEAD WITHOUT CONTRAST CT TEMPORAL BONES WITH CONTRAST TECHNIQUE: Contiguous axial images were obtained from the base of the skull through  the vertex without contrast. Multidetector CT imaging of the temporal bones was performed using the standard protocol with intravenous contrast. CONTRAST:  75 CC ISOVUE 300 COMPARISON:  CT HEAD March 24, 2017 FINDINGS: CT HEAD FINDINGS BRAIN: No intraparenchymal hemorrhage, mass effect nor midline shift. The ventricles and sulci are normal for age. Patchy supratentorial white matter hypodensities within normal range for patient's age, though non-specific are most compatible with chronic small vessel ischemic disease. No acute large vascular territory infarcts. No abnormal extra-axial fluid collections. Basal cisterns are patent. VASCULAR: Moderate calcific atherosclerosis of the carotid siphons. SKULL: No skull fracture. No significant scalp soft tissue swelling. SINUSES/ORBITS: The paranasal sinuses are well-aerated.The included ocular globes and orbital contents are non-suspicious. Status post bilateral ocular lens implants. OTHER: None. CT TEMPORAL BONES FINDINGS- moderately motion degraded examination. RIGHT: External auditory canal is well formed and well aerated. Tympanic membrane is not thickened or retracted. The scutum is sharp. Well aerated middle ear including Prussak's space. Ossicles are well formed and located. Patent aditus ad antrum. Well aerated mastoid air cells without coalescence. Intact tegmen tympani. Intact otic capsule with normal appearance of the inner ear structures. Please note, due to patient motion subtle areas of osteolysis would not be detected. No internal auditory canal expansion. No definite cerebellar pontine angle masses. LEFT: External auditory canal is well formed and aerated. Tympanic membrane may be retracted though limited assessment due to motion. The scutum is sharp. Soft tissue throughout the middle ear including Prussak's space Ossicles are well formed and located. Patent aditus ad antrum. Soft tissue opacifies mastoid air cells without coalescence. Intact tegmen  tympani. Intact otic capsule with normal appearance of the inner ear structures. Please note, due to patient motion subtle areas of osteolysis would not be detected. No internal auditory canal expansion. No definite cerebellar pontine angle masses. No obstructing nasopharyngeal mass. No dural venous sinus thrombosis a an included field-of-view. IMPRESSION: CT HEAD:  Stable negative CT HEAD for age. CT TEMPORAL BONES:  Moderately motion degraded examination. LEFT middle ear and mastoid effusion compatible with Eustation tube dysfunction without CT findings of mastoiditis or abscess. Electronically Signed   By: Awilda Metro M.D.   On: 04/02/2017 23:03    Assessment/Plan  Generalized weakness Advanced age and deconditioning. Decline anticipated. Has worked with OT and PT in ALF. Encourage to be out of bed. Supportive care for now.  Unsteady gait Was able to use her walker and walk unassisted until 2 weeks back. Now, she needs 1-2 person assistance with transfer and 2 person assist with mobility using her walker. She was transferred to SNF on a wheelchair yesterday. Supportive care and fall  precautions.    Protein calorie malnutrition Assist with feeding and encourage po intake as tolerated. If her oral intake declines, poor prognosis. Consider palliative care services then.   Decreased oral intake Poor oral intake. On chart review, daughter would like her oral intake encouraged and for her to take feed as tolerated only. Assistance to be provided with feeding. If continues to refuse feed, will get palliative services involved for comfort care  Arthritis Currently on tylenol 650 mg q6h prn pain, monitor  Chronic allergic rhinitis Continue fexofenadine and moniitor  Acquired hypothyroidism  Stable thyroid function. Continue levothyroxine 75 mcg daily   Hypertension Soft BP reading, pt has been weak, discontinue her lisinopril for now. Continue metoprolol tartrate with holding  parameters.  Hyponatremia Encourage po intake. Check bmp  Chronic diastolic chf Has trace leg edema. Continue metoprolol tartrate current regimen. Discontinue lisinopril for now   Chronic afib Continue apixaban for stroke prophylaxis. Continue metoprolol tartrate for rate control   Aortic stenosis Murmur present. Possible progression of AS contributing to her weakness as well   Paranoid psychosis Stable at present. Continue risperdal 0.25 mg bid and monitor   Chronic low back pain Controlled with acetaminophen 650 mg q6h prn    ckd stage 2 Reviewed bmp    Family/ staff Communication: reviewed care plan with patient and charge nurse  Labs/tests ordered: cc with diff, bmp  Oneal Grout, MD Internal Medicine [facility] [address] [place], [place] [address] Cell Phone (Monday-Friday 8 am - 5 pm): [phone] On Call: [phone] and follow prompts after 5 pm and on weekends Office Phone: [phone] Office Fax: [phone]

## 2017-09-06 LAB — CBC AND DIFFERENTIAL
HCT: 38 (ref 36–46)
HEMOGLOBIN: 12.9 (ref 12.0–16.0)
Platelets: 207 (ref 150–399)
WBC: 10.8

## 2017-09-06 LAB — BASIC METABOLIC PANEL
BUN: 31 — AB (ref 4–21)
Creatinine: 0.9 (ref 0.5–1.1)
GLUCOSE: 85
POTASSIUM: 4.4 (ref 3.4–5.3)
Sodium: 139 (ref 137–147)

## 2017-09-07 ENCOUNTER — Encounter: Payer: Self-pay | Admitting: Family

## 2017-09-07 ENCOUNTER — Non-Acute Institutional Stay (SKILLED_NURSING_FACILITY): Payer: Medicare PPO | Admitting: Family

## 2017-09-07 DIAGNOSIS — R630 Anorexia: Secondary | ICD-10-CM

## 2017-09-07 DIAGNOSIS — R634 Abnormal weight loss: Secondary | ICD-10-CM | POA: Diagnosis not present

## 2017-09-07 DIAGNOSIS — R5381 Other malaise: Secondary | ICD-10-CM | POA: Diagnosis not present

## 2017-09-07 NOTE — Progress Notes (Signed)
Location:  [facility] Room Number: 6 Place of Service:  SNF (31) Provider: Leydi Winstead FNP-C  Oneal Grout, MD  Patient Care Team: Oneal Grout, MD as PCP - General (Internal Medicine) Duke Salvia, MD as Consulting Physician (Cardiology) Corky Crafts, MD as Consulting Physician (Interventional Cardiology) Sheral Apley, MD as Attending Physician (Orthopedic Surgery)  Extended Emergency Contact Information Primary Emergency Contact: McNeill,Dr. Robin Searing [place] Home Phone: [phone] Work Phone: [phone] Mobile Phone: [phone] Relation: Daughter  Code Status:  DNR Goals of care: Advanced Directive information Advanced Directives 09/07/2017  Does Patient Have a Medical Advance Directive? Yes  Type of Advance Directive Out of facility DNR (pink MOST or yellow form);Living will  Does patient want to make changes to medical advance directive? -  Copy of Healthcare Power of Attorney in Chart? No - copy requested  Would patient like information on creating a medical advance directive? -  Pre-existing out of facility DNR order (yellow form or pink MOST form) Yellow form placed in chart (order not valid for inpatient use)     Chief Complaint  Patient presents with  . Acute Visit    decreased appetite, weight loss, family requests Hospice referral    HPI:  Pt is a 83 y.o. female seen today at [facility] for an acute visit for evaluation of weight loss and decreased appetite. She has a significant medical history of HTN, CHF, Afib, TIA, Hypothyroidism, DDD among other conditions. She is seen in his room today per facility Nurse request. Nurse reports patient has had weight loss, decreased appetite and requested Nurse to remove water pitcher from the room that she will not drink.Spoke with patient's daughter on the phone states patient told her to take clothes from the room because she will no longer need them.  Patient's daughter wendy request Hospice consult states " Mom is giving up". Provider discussed recent lab results with patient's daughter BUN 31, CR 0.89, Na 139 ( 09/06/2017). Patient denies any fever, chills or cough.    Past Medical History:  Diagnosis Date  . Acute encephalopathy 03/24/2017  . Acute on chronic diastolic heart failure (HCC)   . AICD (automatic cardioverter/defibrillator) present   . Arthritis    "plenty" (06/17/2015)  . Atrial fibrillation (HCC)   . CHF (congestive heart failure) (HCC)   . Chronic back pain   . Chronic kidney disease    02/17/16 Bun 34, creat 1.05  . Complication of anesthesia    "30 minutes violent shaking after a dental procedure"   . Degenerative disc disease, lumbar    . Diastolic heart failure (HCC)   . Gluten intolerance    . Heart murmur   . Hypertension   . Hypothyroidism    "from the aminodarone"  . Idiopathic scoliosis    . Osteoporosis   . Paroxysmal atrial fibrillation (HCC)   . Pleural effusion 2011   "hugh w/pneumonia"  . Pneumonia 2011; 11/2014  . Sinus bradycardia   . Stroke [facility])    TIA  . Thyroid disease   . TIA (transient ischemic attack) 2000's X 1  . Trigeminal neuralgia of left side of face 12/13/2014  . Unstable gait     Past Surgical History:  Procedure Laterality Date  . BI-VENTRICULAR IMPLANTABLE CARDIOVERTER DEFIBRILLATOR  (CRT-D)  06/17/2015  . CATARACT EXTRACTION W/ INTRAOCULAR LENS  IMPLANT, BILATERAL Bilateral 1991-1992  . EP IMPLANTABLE DEVICE N/A 06/17/2015   Procedure: Pacemaker Implant;  Surgeon: Viviann Spare  Anabel Halon, MD;  Location: [facility] INVASIVE CV LAB;  Service: Cardiovascular;  Laterality: N/A;  . LOOP RECORDER IMPLANT  08/2013   explanted 06/17/2015  . PACEMAKER INSERTION    . TOENAIL AVULSION Left    2nd digit  . TONSILLECTOMY  1944    Allergies  Allergen Reactions  . Codeine Other (See Comments)    unknown  . Codeine Nausea And Vomiting    & headaches  . Levaquin [Levofloxacin In D5w] Itching  .  Levaquin [Levofloxacin In D5w] Swelling and Other (See Comments)    & redness  . Loratadine Other (See Comments)    TACHYCARDIA  . Xylocaine [Lidocaine Hcl] Other (See Comments)    Uncontrolled shaking  . Xylocaine [Lidocaine Hcl] Other (See Comments)    Made her very cold & lots of shaking. Pt and daughter stated that she can take it.  . Loratadine Palpitations    Outpatient Encounter Prescriptions as of 09/07/2017  Medication Sig  . acetaminophen (TYLENOL) 325 MG tablet Take 650 mg by mouth every 6 (six) hours as needed for mild pain or fever.   Marland Kitchen apixaban (ELIQUIS) 2.5 MG TABS tablet Take 1 tablet (2.5 mg total) by mouth 2 (two) times daily.  Marland Kitchen erythromycin ophthalmic ointment Place 1 application into both eyes. Every 3 days at bedtime.  . fexofenadine (ALLEGRA) 180 MG tablet Take 180 mg by mouth daily as needed.   . Hypromellose, PF, (RETAINE [facility]) 0.3 % SOLN Apply 1 drop to eye every 6 (six) hours as needed (both eyes).  Marland Kitchen levothyroxine (SYNTHROID, LEVOTHROID) 75 MCG tablet Take 1 tablet (75 mcg total) by mouth daily before breakfast. Reported on 02/04/2016  . magnesium hydroxide (MILK OF MAGNESIA) 400 MG/5ML suspension Take 30 mLs by mouth daily as needed for mild constipation.   . metoprolol tartrate (LOPRESSOR) 25 MG tablet Take 25 mg by mouth 2 (two) times daily. Hold for SBP <100 or HR < 60  . Nutritional Supplements (RESOURCE 2.0) LIQD Take 90 mLs by mouth 2 (two) times daily.  . risperiDONE (RISPERDAL) 0.25 MG tablet Take 0.25 mg by mouth 2 (two) times daily.   . Skin Protectants, Misc. (AMERIGEL CARE) LOTN Apply 1 application topically daily as needed.   . [DISCONTINUED] lisinopril (PRINIVIL,ZESTRIL) 5 MG tablet Take 1 tablet (5 mg total) by mouth daily.  . [DISCONTINUED] metoprolol tartrate (LOPRESSOR) 25 MG tablet Take 1 tablet (25 mg total) by mouth 2 (two) times daily.  . [DISCONTINUED] NON FORMULARY Take 90 mLs by mouth 3 (three) times daily. Resource   No  facility-administered encounter medications on file as of 09/07/2017.     Review of Systems  Constitutional: Positive for appetite change and unexpected weight change. Negative for activity change, chills, fatigue and fever.  HENT: Negative for congestion, rhinorrhea, sinus pain, sinus pressure, sneezing and sore throat.   Eyes: Negative for pain, discharge, redness and itching.  Respiratory: Negative for cough, chest tightness, shortness of breath and wheezing.   Cardiovascular: Negative for chest pain, palpitations and leg swelling.  Gastrointestinal: Negative for abdominal distention, abdominal pain, diarrhea, nausea and vomiting.  Genitourinary: Negative for dysuria, flank pain, frequency and urgency.  Musculoskeletal: Positive for gait problem.  Skin: Negative for color change, pallor, rash and wound.  Neurological: Negative for dizziness, seizures, syncope, light-headedness and headaches.       Generalized weakness   Hematological: Does not bruise/bleed easily.  Psychiatric/Behavioral: Negative for agitation, confusion, hallucinations and sleep disturbance. The patient is not nervous/anxious.  Immunization History  Administered Date(s) Administered  . Influenza-Unspecified 10/08/2014, 09/23/2015, 10/05/2016  . PPD Test 06/22/2014, 06/25/2014, 06/11/2015, 01/11/2016  . Pneumococcal Polysaccharide-23 08/21/2017  . Td 03/05/2014   Pertinent  Health Maintenance Due  Topic Date Due  . DEXA SCAN  01/18/1986  . INFLUENZA VACCINE  07/25/2017  . PNA vac Low Risk Adult (2 of 2 - PCV13) 08/21/2018   Fall Risk  08/15/2017 02/18/2016 04/24/2015  Falls in the past year? No No Yes  Number falls in past yr: - - 2 or more  Injury with Fall? - - Yes  Risk Factor Category  - - High Fall Risk  Risk for fall due to : - - History of fall(s);Impaired balance/gait  Follow up - - Falls evaluation completed    Vitals:   09/07/17 1343  BP: 128/60  Pulse: 64  Resp: 12  Temp: (!) 96.3 F (35.7  C)  SpO2: 94%  Weight: 88 lb (39.9 kg)  Height:  (1.397 m)   Body mass index is 20.45 kg/m. Physical Exam  Constitutional:  Frail elderly in no acute distress   HENT:  Head: Normocephalic.  Mouth/Throat: Oropharynx is clear and moist. No oropharyngeal exudate.  Eyes: Pupils are equal, round, and reactive to light. Conjunctivae and EOM are normal. Right eye exhibits no discharge. Left eye exhibits no discharge. No scleral icterus.  Neck: Normal range of motion. No JVD present. No thyromegaly present.  Cardiovascular: Intact distal pulses.  Exam reveals no gallop and no friction rub.   Murmur heard. Pulmonary/Chest: Effort normal and breath sounds normal. No respiratory distress. She has no wheezes. She has no rales.  Abdominal: Soft. Bowel sounds are normal. She exhibits no distension. There is no tenderness. There is no rebound and no guarding.  Musculoskeletal: She exhibits no edema or tenderness.  Unsteady gait uses wheelchair. Moves x 4 extremities   Lymphadenopathy:    She has no cervical adenopathy.  Neurological: She is alert. Coordination normal.  Generalized weakness   Skin: Skin is warm and dry. No rash noted. No erythema. No pallor.  Psychiatric: She has a normal mood and affect.   Labs reviewed:  Recent Labs  03/24/17 1429  04/02/17 2027 04/04/17 0334 04/05/17 0415  07/06/17 07/26/17 08/21/17  NA  --   < > 133* 134* 136  --  134* 136* 130*  K  --   < > 4.6 4.5 5.3*  < > 4.5 4.6 4.5  CL  --   < > 102 105 107  --   --   --   --   CO2  --   < > --   --   --   --   GLUCOSE  --   < > 107* 84 67  --   --   --   --   BUN  --   < > --   --  24* 22*  CREATININE  --   < > 0.65 0.61 0.62  --  0.7 0.7 0.8  CALCIUM  --   < > 8.9 8.1* 8.3*  --   --   --   --   MG 1.9  --   --   --   --   --   --   --   --   PHOS 3.6  --   --   --   --   --   --   --   --   < > =  values in this interval not displayed.  Recent Labs  12/03/16 1053 03/24/17 1138  04/02/17 2027 07/06/17 07/26/17 08/21/17  AST 37 ALT ALKPHOS 91 64 68 68 62 50  BILITOT 0.4 0.6 0.7  --   --   --   PROT 5.9* 5.7* 6.2*  --   --   --   ALBUMIN 3.4* 3.4* 3.8  --   --   --     Recent Labs  12/03/16 1053  04/02/17 2027 04/04/17 0334 04/05/17 0415 07/06/17 07/26/17 08/21/17  WBC 8.5  < > 8.2 6.9 9.5 6.5 6.6 8.1  NEUTROABS 6.3  --   --   --   --   --   --   --   HGB 12.0  < > 12.4 11.2* 12.2 9.9* 10.3* 10.9*  HCT 35.2*  < > 35.4* 32.7* 35.2* 29* 31* 32*  MCV 92.9  < > 90.8 94.5 94.4  --   --   --   PLT 214  < > 280 231 220 246 207 211  < > = values in this interval not displayed. Lab Results  Component Value Date   TSH 1.00 07/06/2017   Significant Diagnostic Results in last 30 days:  No results found.  Assessment/Plan 1. Physical deconditioning Afebrile. Has had generalized weakness possible due to her decreased oral intake and weight loss. Nurse reports patient now uses wheelchair and requires assistance to transfer. Patient previously able to ambulate with front wheel walker.continue with PT/OT for strengthening. POA request Hospice consult.    2. Decreased appetite  Eats 25-50% of meals but refused to drink. Hospice consult as above.   3. Weight loss Has had a 10 pound weight loss in less than a month possible due to her decreased oral intake. Hospice consult per POA request.   Family/ staff Communication: Reviewed plan of care with patient and facility Nurse.   Labs/tests ordered: None Hospice consult per POA request.   Caesar Bookman, NP

## 2017-10-03 ENCOUNTER — Non-Acute Institutional Stay (SKILLED_NURSING_FACILITY): Payer: Medicare Other | Admitting: Family

## 2017-10-03 ENCOUNTER — Encounter: Payer: Self-pay | Admitting: Family

## 2017-10-03 DIAGNOSIS — E039 Hypothyroidism, unspecified: Secondary | ICD-10-CM

## 2017-10-03 DIAGNOSIS — I482 Chronic atrial fibrillation, unspecified: Secondary | ICD-10-CM

## 2017-10-03 DIAGNOSIS — I5032 Chronic diastolic (congestive) heart failure: Secondary | ICD-10-CM

## 2017-10-03 DIAGNOSIS — I110 Hypertensive heart disease with heart failure: Secondary | ICD-10-CM

## 2017-10-03 NOTE — Progress Notes (Signed)
Location:  [facility] Room Number: 6 Place of Service:  SNF (31) Provider: Dinah Ngetich FNP-C   Oneal Grout, MD  Patient Care Team: Oneal Grout, MD as PCP - General (Internal Medicine) Duke Salvia, MD as Consulting Physician (Cardiology) Corky Crafts, MD as Consulting Physician (Interventional Cardiology) Sheral Apley, MD as Attending Physician (Orthopedic Surgery)  Extended Emergency Contact Information Primary Emergency Contact: McNeill,Dr. Robin Searing [place] of [place] Home Phone: [phone] Work Phone: [phone] Mobile Phone: [phone] Relation: Daughter  Code Status:  DNR Goals of care: Advanced Directive information Advanced Directives 10/03/2017  Does Patient Have a Medical Advance Directive? Yes  Type of Advance Directive Out of facility DNR (pink MOST or yellow form);Living will  Does patient want to make changes to medical advance directive? -  Copy of Healthcare Power of Attorney in Chart? -  Would patient like information on creating a medical advance directive? -  Pre-existing out of facility DNR order (yellow form or pink MOST form) Yellow form placed in chart (order not valid for inpatient use);Pink MOST form placed in chart (order not valid for inpatient use)     Chief Complaint  Patient presents with  . Medical Management of Chronic Issues    routine visit    HPI:  Pt is a 83 y.o. female seen today [facility] for medical management of chronic diseases.she has a medical history of HTN, CHF, Afib, TIA,Aortic stenosis,hypothyroidism among other conditions. She is seen in her room today. She states has been walking to the dinning room for meals and feels much better. She denies any acute issues this visit. No recent fall episodes.she has had 0.7 lbs weight loss over one month. Facility Nurse reports no new acute issues.She is currently on hospice service due to deconditioning.         Past Medical  History:  Diagnosis Date  . Acute encephalopathy 03/24/2017  . Acute on chronic diastolic heart failure (HCC)   . AICD (automatic cardioverter/defibrillator) present   . Arthritis    "plenty" (06/17/2015)  . Atrial fibrillation (HCC)   . CHF (congestive heart failure) (HCC)   . Chronic back pain   . Chronic kidney disease    02/17/16 Bun 34, creat 1.05  . Complication of anesthesia    "30 minutes violent shaking after a dental procedure"   . Degenerative disc disease, lumbar    . Diastolic heart failure (HCC)   . Gluten intolerance    . Heart murmur   . Hypertension   . Hypothyroidism    "from the aminodarone"  . Idiopathic scoliosis    . Osteoporosis   . Paroxysmal atrial fibrillation (HCC)   . Pleural effusion 2011   "hugh w/pneumonia"  . Pneumonia 2011; 11/2014  . Sinus bradycardia   . Stroke [facility])    TIA  . Thyroid disease   . TIA (transient ischemic attack) 2000's X 1  . Trigeminal neuralgia of left side of face 12/13/2014  . Unstable gait     Past Surgical History:  Procedure Laterality Date  . BI-VENTRICULAR IMPLANTABLE CARDIOVERTER DEFIBRILLATOR  (CRT-D)  06/17/2015  . CATARACT EXTRACTION W/ INTRAOCULAR LENS  IMPLANT, BILATERAL Bilateral 1991-1992  . EP IMPLANTABLE DEVICE N/A 06/17/2015   Procedure: Pacemaker Implant;  Surgeon: Duke Salvia, MD;  Location: [facility] INVASIVE CV LAB;  Service: Cardiovascular;  Laterality: N/A;  . LOOP RECORDER IMPLANT  08/2013   explanted 06/17/2015  . PACEMAKER INSERTION    . TOENAIL  AVULSION Left    2nd digit  . TONSILLECTOMY  1944    Allergies  Allergen Reactions  . Codeine Other (See Comments)    unknown  . Codeine Nausea And Vomiting    & headaches  . Levaquin [Levofloxacin In D5w] Itching  . Levaquin [Levofloxacin In D5w] Swelling and Other (See Comments)    & redness  . Loratadine Other (See Comments)    TACHYCARDIA  . Xylocaine [Lidocaine Hcl] Other (See Comments)    Uncontrolled shaking  . Xylocaine [Lidocaine Hcl]  Other (See Comments)    Made her very cold & lots of shaking. Pt and daughter stated that she can take it.  . Loratadine Palpitations    Allergies as of 10/03/2017      Reactions   Codeine Other (See Comments)   unknown   Codeine Nausea And Vomiting   & headaches   Levaquin [levofloxacin In D5w] Itching   Levaquin [levofloxacin In D5w] Swelling, Other (See Comments)   & redness   Loratadine Other (See Comments)   TACHYCARDIA   Xylocaine [lidocaine Hcl] Other (See Comments)   Uncontrolled shaking   Xylocaine [lidocaine Hcl] Other (See Comments)   Made her very cold & lots of shaking. Pt and daughter stated that she can take it.   Loratadine Palpitations      Medication List       Accurate as of 10/03/17  4:25 PM. Always use your most recent med list.          acetaminophen 325 MG tablet Commonly known as:  TYLENOL Take 650 mg by mouth every 6 (six) hours as needed for mild pain or fever.   AMERIGEL CARE Lotn Apply 1 application topically daily as needed.   apixaban 2.5 MG Tabs tablet Commonly known as:  ELIQUIS Take 1 tablet (2.5 mg total) by mouth 2 (two) times daily.   ARTIFICIAL TEAR OP Apply 1 drop to eye every 6 (six) hours as needed. For dry eyes   erythromycin ophthalmic ointment Place 1 application into both eyes. Every 3 days at bedtime.   fexofenadine 180 MG tablet Commonly known as:  ALLEGRA Take 180 mg by mouth daily as needed.   levothyroxine 75 MCG tablet Commonly known as:  SYNTHROID, LEVOTHROID Take 1 tablet (75 mcg total) by mouth daily before breakfast. Reported on 02/04/2016   magnesium hydroxide 400 MG/5ML suspension Commonly known as:  MILK OF MAGNESIA Take 30 mLs by mouth daily as needed for mild constipation.   metoprolol tartrate 25 MG tablet Commonly known as:  LOPRESSOR Take 25 mg by mouth 2 (two) times daily. Hold for SBP <100 or HR < 60   risperiDONE 0.25 MG tablet Commonly known as:  RISPERDAL Take 0.25 mg by mouth 2 (two)  times daily.       Review of Systems  Constitutional: Negative for activity change, appetite change, chills and fatigue.  HENT: Positive for hearing loss. Negative for congestion, rhinorrhea, sinus pain, sinus pressure, sneezing and trouble swallowing.   Eyes: Negative for pain, discharge and redness.       Wears eye glasses  Respiratory: Negative for cough, chest tightness, shortness of breath and wheezing.   Cardiovascular: Positive for leg swelling. Negative for chest pain and palpitations.  Gastrointestinal: Negative for abdominal distention, abdominal pain, constipation, diarrhea, nausea and vomiting.       LBM 10/02/2017  Endocrine: Negative for cold intolerance, heat intolerance, polydipsia, polyphagia and polyuria.  Genitourinary: Negative for dysuria, flank pain, frequency and urgency.  Musculoskeletal: Positive for gait problem.  Skin: Negative for color change, pallor and rash.  Neurological: Negative for dizziness, seizures, syncope, light-headedness and headaches.  Hematological: Does not bruise/bleed easily.  Psychiatric/Behavioral: Negative for agitation, confusion, hallucinations and sleep disturbance. The patient is not nervous/anxious.     Immunization History  Administered Date(s) Administered  . Influenza-Unspecified 10/08/2014, 09/23/2015, 10/05/2016  . PPD Test 06/22/2014, 06/25/2014, 06/11/2015, 01/11/2016  . Pneumococcal Polysaccharide-23 08/21/2017  . Td 03/05/2014   Pertinent  Health Maintenance Due  Topic Date Due  . DEXA SCAN  01/18/1986  . INFLUENZA VACCINE  07/25/2017  . PNA vac Low Risk Adult (2 of 2 - PCV13) 08/21/2018   Fall Risk  08/15/2017 02/18/2016 04/24/2015  Falls in the past year? No No Yes  Number falls in past yr: - - 2 or more  Injury with Fall? - - Yes  Risk Factor Category  - - High Fall Risk  Risk for fall due to : - - History of fall(s);Impaired balance/gait  Follow up - - Falls evaluation completed   Functional Status Survey:      Vitals:   10/03/17 1412  BP: 128/60  Pulse: 68  Resp: 18  Temp: 98.4 F (36.9 C)  SpO2: 97%  Weight: 85 lb 6.4 oz (38.7 kg)  Height:  (1.397 m)   Body mass index is 19.85 kg/m. Physical Exam  Constitutional: She is oriented to person, place, and time.  Frail elderly in no acute distress   HENT:  Head: Normocephalic.  Right Ear: External ear normal.  Left Ear: External ear normal.  Mouth/Throat: Oropharynx is clear and moist. No oropharyngeal exudate.  Upper dentures   Eyes: Pupils are equal, round, and reactive to light. Conjunctivae and EOM are normal. Right eye exhibits no discharge. Left eye exhibits no discharge. No scleral icterus.  Eye glasses in place   Neck: Normal range of motion. No JVD present. No thyromegaly present.  Cardiovascular: Intact distal pulses.  Exam reveals no gallop and no friction rub.   Murmur heard. Pulmonary/Chest: Effort normal and breath sounds normal. No respiratory distress. She has no wheezes. She has no rales.  Abdominal: Soft. Bowel sounds are normal. She exhibits no distension. There is no tenderness. There is no rebound and no guarding.  Musculoskeletal: She exhibits no tenderness.  Moves x 4 extremities.Severe spinal scoliosis.Bilateral lower extremities trace edema. Unsteady gait uses Rolator.    Lymphadenopathy:    She has no cervical adenopathy.  Neurological: She is oriented to person, place, and time. Coordination normal.  Skin: Skin is warm and dry. No rash noted. No erythema. No pallor.  Skin intact   Psychiatric: She has a normal mood and affect.   Labs reviewed:  Recent Labs  03/24/17 1429  04/02/17 2027 04/04/17 0334 04/05/17 0415  07/26/17 08/21/17 09/06/17  NA  --   < > 133* 134* 136  < > 136* 130* 139  K  --   < > 4.6 4.5 5.3*  < > 4.6 4.5 4.4  CL  --   < > 102 105 107  --   --   --   --   CO2  --   < > --   --   --   --   GLUCOSE  --   < > 107* 84 67  --   --   --   --   BUN  --   < > --  24* 22* 31*  CREATININE  --   < > 0.65 0.61 0.62  < > 0.7 0.8 0.9  CALCIUM  --   < > 8.9 8.1* 8.3*  --   --   --   --   MG 1.9  --   --   --   --   --   --   --   --   PHOS 3.6  --   --   --   --   --   --   --   --   < > = values in this interval not displayed.  Recent Labs  12/03/16 1053 03/24/17 1138 04/02/17 2027 07/06/17 07/26/17 08/21/17  AST 37 ALT ALKPHOS 91 64 68 68 62 50  BILITOT 0.4 0.6 0.7  --   --   --   PROT 5.9* 5.7* 6.2*  --   --   --   ALBUMIN 3.4* 3.4* 3.8  --   --   --     Recent Labs  12/03/16 1053  04/02/17 2027 04/04/17 0334 04/05/17 0415  07/26/17 08/21/17 09/06/17  WBC 8.5  < > 8.2 6.9 9.5  < > 6.6 8.1 10.8  NEUTROABS 6.3  --   --   --   --   --   --   --   --   HGB 12.0  < > 12.4 11.2* 12.2  < > 10.3* 10.9* 12.9  HCT 35.2*  < > 35.4* 32.7* 35.2*  < > 31* 32* 38  MCV 92.9  < > 90.8 94.5 94.4  --   --   --   --   PLT 214  < > 280 231 220  < > 207 211 207  < > = values in this interval not displayed. Lab Results  Component Value Date   TSH 1.00 07/06/2017    Significant Diagnostic Results in last 30 days:  No results found.  Assessment/Plan 1. Hypothyroidism, unspecified type Asymptomatic.Continue on levothyroxine 75 mcg tablet daily. Recheck TSH level on next lab day.    2. Chronic atrial fibrillation HR irregular. Continue on metoprolol 25 mg tablet twice daily and Eliquis 2.5 mg Tablet twice daily.    3. Chronic diastolic CHF (congestive heart failure) Stable. Exam findings negative for cough, shortness of breath,rales or rales. Trace lower extremities edema.No recent abrupt weight gain. Continue to monitor weight. Continue on metoprolol 25 mg tablet twice daily.   4. Hypertensive heart disease with chronic diastolic congestive heart failure  B/p stable. Continue on metoprolol 25 mg tablet twice daily.monitor BMP.    Family/ staff Communication: Reviewed plan of care with patient and facility Nurse  supervisor   Labs/tests ordered: Recheck TSH level on next lab day.    Caesar Bookman, NP

## 2017-10-04 ENCOUNTER — Encounter: Payer: Self-pay | Admitting: *Deleted

## 2017-10-04 LAB — TSH: TSH: 1.07 (ref 0.41–5.90)

## 2017-10-22 ENCOUNTER — Non-Acute Institutional Stay (SKILLED_NURSING_FACILITY): Payer: Medicare Other | Admitting: Family

## 2017-10-22 ENCOUNTER — Encounter: Payer: Self-pay | Admitting: Family

## 2017-10-22 DIAGNOSIS — I482 Chronic atrial fibrillation, unspecified: Secondary | ICD-10-CM

## 2017-10-22 DIAGNOSIS — I5032 Chronic diastolic (congestive) heart failure: Secondary | ICD-10-CM | POA: Diagnosis not present

## 2017-10-22 DIAGNOSIS — I110 Hypertensive heart disease with heart failure: Secondary | ICD-10-CM | POA: Diagnosis not present

## 2017-10-22 MED ORDER — METOPROLOL TARTRATE 25 MG PO TABS: 12.5000 mg | ORAL_TABLET | Freq: Two times a day (BID) | ORAL | 3 refills | Status: AC

## 2017-10-22 NOTE — Progress Notes (Signed)
Location:  [facility] Room Number: 6 Place of Service:  SNF (31) Provider: Dajaun Goldring FNP-C  Oneal GroutPandey, Mahima, MD  Patient Care Team: Oneal GroutPandey, Mahima, MD as PCP - General (Internal Medicine) Duke SalviaKlein, Steven C, MD as Consulting Physician (Cardiology) Corky CraftsVaranasi, Jayadeep S, MD as Consulting Physician (Interventional Cardiology) Sheral ApleyMurphy, Timothy D, MD as Attending Physician (Orthopedic Surgery)  Extended Emergency Contact Information Primary Emergency Contact: McNeill,Dr. Robin SearingWendy  [place] Home Phone: [phone] Work Phone: [phone] Mobile Phone: [phone] Relation: Daughter  Code Status:  DNR Goals of care: Advanced Directive information Advanced Directives 10/22/2017  Does Patient Have a Medical Advance Directive? Yes  Type of Advance Directive Out of facility DNR (pink MOST or yellow form);Living will  Does patient want to make changes to medical advance directive? -  Copy of Healthcare Power of Attorney in Chart? -  Would patient like information on creating a medical advance directive? -  Pre-existing out of facility DNR order (yellow form or pink MOST form) Yellow form placed in chart (order not valid for inpatient use);Pink MOST form placed in chart (order not valid for inpatient use)     Chief Complaint  Patient presents with  . Acute Visit    hypotension    HPI:  Pt is a 83 y.o. female [facility] for evaluation of low blood pressure. She is seen in her room today per facility  Nurse Teri request. Nurse reports patient's blood pressure seems to be running low.Patient denies any fever, chills, cough,headache, dizziness or faintness.Patient's Blood pressure log reviewed B/p readings ranging in the 100's/50's-120's/50's with occasional 90's/60's. HR ranging in the 60's-70's.Last  fall episode 10/14/2017.   Past Medical History:  Diagnosis Date  . Acute encephalopathy 03/24/2017  . Acute on  chronic diastolic heart failure (HCC)   . AICD (automatic cardioverter/defibrillator) present   . Arthritis    "plenty" (06/17/2015)  . Atrial fibrillation (HCC)   . CHF (congestive heart failure) (HCC)   . Chronic back pain   . Chronic kidney disease    02/17/16 Bun 34, creat 1.05  . Complication of anesthesia    "30 minutes violent shaking after a dental procedure"   . Degenerative disc disease, lumbar    . Diastolic heart failure (HCC)   . Gluten intolerance    . Heart murmur   . Hypertension   . Hypothyroidism    "from the aminodarone"  . Idiopathic scoliosis    . Osteoporosis   . Paroxysmal atrial fibrillation (HCC)   . Pleural effusion 2011   "hugh w/pneumonia"  . Pneumonia 2011; 11/2014  . Sinus bradycardia   . Stroke [facility](HCC)    TIA  . Thyroid disease   . TIA (transient ischemic attack) 2000's X 1  . Trigeminal neuralgia of left side of face 12/13/2014  . Unstable gait     Past Surgical History:  Procedure Laterality Date  . BI-VENTRICULAR IMPLANTABLE CARDIOVERTER DEFIBRILLATOR  (CRT-D)  06/17/2015  . CATARACT EXTRACTION W/ INTRAOCULAR LENS  IMPLANT, BILATERAL Bilateral 1991-1992  . EP IMPLANTABLE DEVICE N/A 06/17/2015   Procedure: Pacemaker Implant;  Surgeon: Duke SalviaSteven C Klein, MD;  [facility];  Service: Cardiovascular;  Laterality: N/A;  . LOOP RECORDER IMPLANT  08/2013   explanted 06/17/2015  . PACEMAKER INSERTION    . TOENAIL AVULSION Left    2nd digit  . TONSILLECTOMY  1944    Allergies  Allergen Reactions  . Codeine Other (  See Comments)    unknown  . Codeine Nausea And Vomiting    & headaches  . Levaquin [Levofloxacin In D5w] Itching  . Levaquin [Levofloxacin In D5w] Swelling and Other (See Comments)    & redness  . Loratadine Other (See Comments)    TACHYCARDIA  . Xylocaine [Lidocaine Hcl] Other (See Comments)    Uncontrolled shaking  . Xylocaine [Lidocaine Hcl] Other (See Comments)    Made her very cold & lots of shaking. Pt and  daughter stated that she can take it.  . Loratadine Palpitations    Outpatient Encounter Prescriptions as of 10/22/2017  Medication Sig  . acetaminophen (TYLENOL) 325 MG tablet Take 650 mg by mouth every 6 (six) hours as needed for mild pain or fever.   Marland Kitchen apixaban (ELIQUIS) 2.5 MG TABS tablet Take 1 tablet (2.5 mg total) by mouth 2 (two) times daily.  . ARTIFICIAL TEAR OP Apply 1 drop to eye every 6 (six) hours as needed. For dry eyes  . erythromycin ophthalmic ointment Place 1 application into both eyes. Every 3 days at bedtime.  . fexofenadine (ALLEGRA) 180 MG tablet Take 180 mg by mouth daily as needed.   Marland Kitchen levothyroxine (SYNTHROID, LEVOTHROID) 75 MCG tablet Take 1 tablet (75 mcg total) by mouth daily before breakfast. Reported on 02/04/2016  . magnesium hydroxide (MILK OF MAGNESIA) 400 MG/5ML suspension Take 30 mLs by mouth daily as needed for mild constipation.   . metoprolol tartrate (LOPRESSOR) 25 MG tablet Take 25 mg by mouth 2 (two) times daily. Hold for SBP <100 or HR < 60  . risperiDONE (RISPERDAL) 0.25 MG tablet Take 0.25 mg by mouth 2 (two) times daily.   . Skin Protectants, Misc. (AMERIGEL CARE) LOTN Apply 1 application topically daily as needed.    No facility-administered encounter medications on file as of 10/22/2017.     Review of Systems  Constitutional: Negative for activity change, appetite change, chills, fatigue and fever.  HENT: Negative for congestion, rhinorrhea, sinus pain, sinus pressure, sneezing and sore throat.   Eyes: Negative for redness.       Wears eye glasses  Respiratory: Negative for cough, chest tightness, shortness of breath and wheezing.   Cardiovascular: Negative for chest pain, palpitations and leg swelling.  Gastrointestinal: Negative for abdominal distention, abdominal pain, constipation, diarrhea, nausea and vomiting.  Endocrine: Negative for cold intolerance and heat intolerance.  Genitourinary: Negative for difficulty urinating, flank pain,  frequency and urgency.  Musculoskeletal: Positive for gait problem.  Neurological: Negative for dizziness, syncope, light-headedness and headaches.  Psychiatric/Behavioral: Negative for agitation and confusion. The patient is not nervous/anxious.     Immunization History  Administered Date(s) Administered  . Influenza-Unspecified 10/08/2014, 09/23/2015, 10/05/2016, 10/04/2017  . PPD Test 06/22/2014, 06/25/2014, 06/11/2015, 01/11/2016  . Pneumococcal Polysaccharide-23 08/21/2017  . Td 03/05/2014   Pertinent  Health Maintenance Due  Topic Date Due  . DEXA SCAN  01/18/1986  . PNA vac Low Risk Adult (2 of 2 - PCV13) 08/21/2018  . INFLUENZA VACCINE  Completed   Fall Risk  08/15/2017 02/18/2016 04/24/2015  Falls in the past year? No No Yes  Number falls in past yr: - - 2 or more  Injury with Fall? - - Yes  Risk Factor Category  - - High Fall Risk  Risk for fall due to : - - History of fall(s);Impaired balance/gait  Follow up - - Falls evaluation completed    Vitals:   10/22/17 1026  BP: 130/64  Pulse: 68  Resp:  18  Temp: (!) 97.1 F (36.2 C)  SpO2: 94%  Weight: 85 lb 6.4 oz (38.7 kg)  Height: 4\' 7"  (1.397 m)   Body mass index is 19.85 kg/m. Physical Exam  Constitutional: She is oriented to person, place, and time.  Frail Elderly in no acute distress   HENT:  Head: Normocephalic.  Right Ear: External ear normal.  Left Ear: External ear normal.  Mouth/Throat: Oropharynx is clear and moist. No oropharyngeal exudate.  Dentures   Eyes: Pupils are equal, round, and reactive to light. Conjunctivae and EOM are normal. Right eye exhibits no discharge. Left eye exhibits no discharge. No scleral icterus.  Eye glasses in place   Neck: Normal range of motion. No JVD present. No thyromegaly present.  Cardiovascular: Intact distal pulses.  Exam reveals no gallop and no friction rub.   Murmur heard. Pulmonary/Chest: Effort normal and breath sounds normal. No respiratory distress. She  has no wheezes. She has no rales.  Abdominal: Soft. Bowel sounds are normal. She exhibits no distension. There is no tenderness. There is no rebound and no guarding.  Musculoskeletal: She exhibits no edema or tenderness.  Unsteady gait uses Rolator for ambulation   Lymphadenopathy:    She has no cervical adenopathy.  Neurological: She is oriented to person, place, and time. Coordination normal.  Skin: Skin is warm and dry. No rash noted. No erythema. No pallor.  Skin intact   Psychiatric: She has a normal mood and affect.   Labs reviewed:  Recent Labs  03/24/17 1429  04/02/17 2027 04/04/17 0334 04/05/17 0415  07/26/17 08/21/17 09/06/17  NA  --   < > 133* 134* 136  < > 136* 130* 139  K  --   < > 4.6 4.5 5.3*  < > 4.6 4.5 4.4  CL  --   < > 102 105 107  --   --   --   --   CO2  --   < > 24 23 23   --   --   --   --   GLUCOSE  --   < > 107* 84 67  --   --   --   --   BUN  --   < > 14 12 12   --  24* 22* 31*  CREATININE  --   < > 0.65 0.61 0.62  < > 0.7 0.8 0.9  CALCIUM  --   < > 8.9 8.1* 8.3*  --   --   --   --   MG 1.9  --   --   --   --   --   --   --   --   PHOS 3.6  --   --   --   --   --   --   --   --   < > = values in this interval not displayed.  Recent Labs  12/03/16 1053 03/24/17 1138 04/02/17 2027 07/06/17 07/26/17 08/21/17  AST 37 30 27 22 22 24   ALT 21 16 20 17 16 19   ALKPHOS 91 64 68 68 62 50  BILITOT 0.4 0.6 0.7  --   --   --   PROT 5.9* 5.7* 6.2*  --   --   --   ALBUMIN 3.4* 3.4* 3.8  --   --   --     Recent Labs  12/03/16 1053  04/02/17 2027 04/04/17 0334 04/05/17 0415  07/26/17 08/21/17 09/06/17  WBC 8.5  < >  8.2 6.9 9.5  < > 6.6 8.1 10.8  NEUTROABS 6.3  --   --   --   --   --   --   --   --   HGB 12.0  < > 12.4 11.2* 12.2  < > 10.3* 10.9* 12.9  HCT 35.2*  < > 35.4* 32.7* 35.2*  < > 31* 32* 38  MCV 92.9  < > 90.8 94.5 94.4  --   --   --   --   PLT 214  < > 280 231 220  < > 207 211 207  < > = values in this interval not displayed. Lab Results    Component Value Date   TSH 1.07 10/04/2017   Significant Diagnostic Results in last 30 days:  No results found.  Assessment/Plan Hypertensive heart disease with chronic diastolic congestive heart failure B/p readings ranging in the 100's/50's-120's/50's with occasional 90's/60's. HR ranging in the 60's-70's.Last  fall episode 10/14/2017.She remains high risk for falls.will reduce metoprolol tartrate to 12.5 mg tablet by mouth twice daily. Continue to monitor blood pressure. Will further reduce metoprolol dose if blood pressure remains on the lower range due to high risk for falls.   Afib HR controlled 60's-70's. Adjust metoprolol 25 mg Tablet as above due to high risk for falls. Continue to monitor.   Family/ staff Communication: Reviewed plan of care with patient and facility Nurse.   Labs/tests ordered: None   Faye Sanfilippo C Sherrica Niehaus, NP

## 2017-11-07 ENCOUNTER — Non-Acute Institutional Stay (SKILLED_NURSING_FACILITY): Payer: Medicare Other | Admitting: Internal Medicine

## 2017-11-07 ENCOUNTER — Encounter: Payer: Self-pay | Admitting: Internal Medicine

## 2017-11-07 DIAGNOSIS — I110 Hypertensive heart disease with heart failure: Secondary | ICD-10-CM

## 2017-11-07 DIAGNOSIS — J309 Allergic rhinitis, unspecified: Secondary | ICD-10-CM | POA: Diagnosis not present

## 2017-11-07 DIAGNOSIS — I5032 Chronic diastolic (congestive) heart failure: Secondary | ICD-10-CM

## 2017-11-07 DIAGNOSIS — R2681 Unsteadiness on feet: Secondary | ICD-10-CM

## 2017-11-07 DIAGNOSIS — I480 Paroxysmal atrial fibrillation: Secondary | ICD-10-CM

## 2017-11-07 DIAGNOSIS — F422 Mixed obsessional thoughts and acts: Secondary | ICD-10-CM

## 2017-11-07 NOTE — Progress Notes (Signed)
Location:  [facility] Room Number: 6 Place of Service:  SNF [phone]) Provider:  Oneal Grout MD  Oneal Grout, MD  Patient Care Team: Oneal Grout, MD as PCP - General (Internal Medicine) Duke Salvia, MD as Consulting Physician (Cardiology) Corky Crafts, MD as Consulting Physician (Interventional Cardiology) Sheral Apley, MD as Attending Physician (Orthopedic Surgery)  Extended Emergency Contact Information Primary Emergency Contact: McNeill,Dr. Robin S[place] Home Phone: [phone] Work Phone: [phone] Mobile Phone: [phone] Relation: Daughter  Code Status:  DNR  Goals of care: Advanced Directive information Advanced Directives 11/07/2017  Does Patient Have a Medical Advance Directive? Yes  Type of Advance Directive Living will;Out of facility DNR (pink MOST or yellow form)  Does patient want to make changes to medical advance directive? No - Patient declined  Copy of Healthcare Power of Attorney in Chart? -  Would patient like information on creating a medical advance directive? -  Pre-existing out of facility DNR order (yellow form or pink MOST form) Yellow form placed in chart (order not valid for inpatient use);Pink MOST form placed in chart (order not valid for inpatient use)     Chief Complaint  Patient presents with  . Medical Management of Chronic Issues    Routine Visit     HPI:  Pt is a 83 y.o. female seen today for medical management of chronic diseases. She is seen in her room today. She had a fall on 10/14/17 with no apparent injury and her neurochecks were within normal limit. She has unsteady gait. She participates in group activities. Few elevated BP on review with readings 150/60, 158/92, 156/90. Continues to have intermittent runny nose with clear nasal discharge. Takes her medications. Also followed by hospice services. No concern from nursing this visit.   Past Medical History:    Diagnosis Date  . Acute encephalopathy 03/24/2017  . Acute on chronic diastolic heart failure (HCC)   . AICD (automatic cardioverter/defibrillator) present   . Arthritis    "plenty" (06/17/2015)  . Atrial fibrillation (HCC)   . CHF (congestive heart failure) (HCC)   . Chronic back pain   . Chronic kidney disease    02/17/16 Bun 34, creat 1.05  . Complication of anesthesia    "30 minutes violent shaking after a dental procedure"   . Degenerative disc disease, lumbar    . Diastolic heart failure (HCC)   . Gluten intolerance    . Heart murmur   . Hypertension   . Hypothyroidism    "from the aminodarone"  . Idiopathic scoliosis    . Osteoporosis   . Paroxysmal atrial fibrillation (HCC)   . Pleural effusion 2011   "hugh w/pneumonia"  . Pneumonia 2011; 11/2014  . Sinus bradycardia   . Stroke [facility])    TIA  . Thyroid disease   . TIA (transient ischemic attack) 2000's X 1  . Trigeminal neuralgia of left side of face 12/13/2014  . Unstable gait     Past Surgical History:  Procedure Laterality Date  . BI-VENTRICULAR IMPLANTABLE CARDIOVERTER DEFIBRILLATOR  (CRT-D)  06/17/2015  . CATARACT EXTRACTION W/ INTRAOCULAR LENS  IMPLANT, BILATERAL Bilateral 1991-1992  . LOOP RECORDER IMPLANT  08/2013   explanted 06/17/2015  . PACEMAKER INSERTION    . TOENAIL AVULSION Left    2nd digit  . TONSILLECTOMY  1944    Allergies  Allergen Reactions  . Codeine Other (See Comments)    unknown  . Codeine Nausea And Vomiting    &  headaches  . Levaquin [Levofloxacin In D5w] Itching  . Levaquin [Levofloxacin In D5w] Swelling and Other (See Comments)    & redness  . Loratadine Other (See Comments)    TACHYCARDIA  . Xylocaine [Lidocaine Hcl] Other (See Comments)    Uncontrolled shaking  . Xylocaine [Lidocaine Hcl] Other (See Comments)    Made her very cold & lots of shaking. Pt and daughter stated that she can take it.  . Loratadine Palpitations    Outpatient Encounter Medications as of  11/07/2017  Medication Sig  . acetaminophen (TYLENOL) 325 MG tablet Take 650 mg by mouth every 6 (six) hours as needed for mild pain or fever.   Marland Kitchen. apixaban (ELIQUIS) 2.5 MG TABS tablet Take 1 tablet (2.5 mg total) by mouth 2 (two) times daily.  . ARTIFICIAL TEAR OP Apply 1 drop to eye every 6 (six) hours as needed. For dry eyes  . erythromycin ophthalmic ointment Place 1 application into both eyes. Every 3 days at bedtime.  . fexofenadine (ALLEGRA) 180 MG tablet Take 180 mg by mouth daily as needed.   Marland Kitchen. levothyroxine (SYNTHROID, LEVOTHROID) 75 MCG tablet Take 1 tablet (75 mcg total) by mouth daily before breakfast. Reported on 02/04/2016  . metoprolol tartrate (LOPRESSOR) 25 MG tablet Take 0.5 tablets (12.5 mg total) by mouth 2 (two) times daily.  . risperiDONE (RISPERDAL) 0.25 MG tablet Take 0.25 mg by mouth 2 (two) times daily.   . Skin Protectants, Misc. (AMERIGEL CARE) LOTN Apply 1 application topically daily as needed.   . [DISCONTINUED] magnesium hydroxide (MILK OF MAGNESIA) 400 MG/5ML suspension Take 30 mLs by mouth daily as needed for mild constipation.    No facility-administered encounter medications on file as of 11/07/2017.     Review of Systems  Constitutional: Negative for appetite change, chills and fever.  HENT: Positive for congestion, hearing loss and rhinorrhea. Negative for ear pain and mouth sores.   Eyes: Negative for pain.       Wears corrective glasses  Respiratory: Negative for cough, shortness of breath and wheezing.   Cardiovascular: Positive for leg swelling. Negative for chest pain and palpitations.       Chronic leg edema  Gastrointestinal: Negative for abdominal pain, nausea and vomiting.  Genitourinary: Negative for dysuria.  Musculoskeletal: Positive for arthralgias.  Skin: Negative for rash and wound.  Neurological: Negative for dizziness and headaches.  Psychiatric/Behavioral: Positive for behavioral problems.    Immunization History  Administered  Date(s) Administered  . Influenza-Unspecified 10/08/2014, 09/23/2015, 10/05/2016, 10/04/2017  . PPD Test 06/22/2014, 06/25/2014, 06/11/2015, 01/11/2016  . Pneumococcal Polysaccharide-23 08/21/2017  . Td 03/05/2014   Pertinent  Health Maintenance Due  Topic Date Due  . DEXA SCAN  01/18/1986  . PNA vac Low Risk Adult (2 of 2 - PCV13) 08/21/2018  . INFLUENZA VACCINE  Completed   Fall Risk  08/15/2017 02/18/2016 04/24/2015  Falls in the past year? No No Yes  Number falls in past yr: - - 2 or more  Injury with Fall? - - Yes  Risk Factor Category  - - High Fall Risk  Risk for fall due to : - - History of fall(s);Impaired balance/gait  Follow up - - Falls evaluation completed   Functional Status Survey:    Vitals:   11/07/17 1145  BP: 138/76  Pulse: 77  Resp: 12  Temp: 97.6 F (36.4 C)  TempSrc: Oral  SpO2: 97%  Weight: 93 lb (42.2 kg)  Height: 4\' 7"  (1.397 m)   Body mass  index is 21.62 kg/m.   Wt Readings from Last 3 Encounters:  11/07/17 93 lb (42.2 kg)  10/22/17 85 lb 6.4 oz (38.7 kg)  10/03/17 85 lb 6.4 oz (38.7 kg)   Physical Exam  Constitutional: She is oriented to person, place, and time. No distress.  Frail, elderly female patient in no acute distress.  HENT:  Head: Normocephalic and atraumatic.  Mouth/Throat: Oropharynx is clear and moist. No oropharyngeal exudate.  Clear nasal discharge  Eyes: Pupils are equal, round, and reactive to light. Right eye exhibits no discharge. Left eye exhibits no discharge.  Neck: Normal range of motion. Neck supple.  Cardiovascular: Normal rate and regular rhythm.  Murmur heard. Pulmonary/Chest: Effort normal and breath sounds normal. No respiratory distress. She has no wheezes. She has no rales.  Abdominal: Soft. Bowel sounds are normal. There is no tenderness. There is no guarding.  Musculoskeletal: She exhibits edema.  Kyphosis +, scoliosis +, stooped posture, able to move all 4 extremities, arthritis changes to her fingers,  unsteady gait, uses a walker  Lymphadenopathy:    She has no cervical adenopathy.  Neurological: She is alert and oriented to person, place, and time.  Skin: Skin is warm and dry. She is not diaphoretic.    Labs reviewed: Recent Labs    03/24/17 1429  04/02/17 2027 04/04/17 0334 04/05/17 0415  07/26/17 08/21/17 09/06/17  NA  --    < > 133* 134* 136   < > 136* 130* 139  K  --    < > 4.6 4.5 5.3*   < > 4.6 4.5 4.4  CL  --    < > 102 105 107  --   --   --   --   CO2  --    < > 24 23 23   --   --   --   --   GLUCOSE  --    < > 107* 84 67  --   --   --   --   BUN  --    < > 14 12 12   --  24* 22* 31*  CREATININE  --    < > 0.65 0.61 0.62   < > 0.7 0.8 0.9  CALCIUM  --    < > 8.9 8.1* 8.3*  --   --   --   --   MG 1.9  --   --   --   --   --   --   --   --   PHOS 3.6  --   --   --   --   --   --   --   --    < > = values in this interval not displayed.   Recent Labs    12/03/16 1053 03/24/17 1138 04/02/17 2027 07/06/17 07/26/17 08/21/17  AST 37 30 27 22 22 24   ALT 21 16 20 17 16 19   ALKPHOS 91 64 68 68 62 50  BILITOT 0.4 0.6 0.7  --   --   --   PROT 5.9* 5.7* 6.2*  --   --   --   ALBUMIN 3.4* 3.4* 3.8  --   --   --    Recent Labs    12/03/16 1053  04/02/17 2027 04/04/17 0334 04/05/17 0415  07/26/17 08/21/17 09/06/17  WBC 8.5   < > 8.2 6.9 9.5   < > 6.6 8.1 10.8  NEUTROABS 6.3  --   --   --   --   --   --   --   --  HGB 12.0   < > 12.4 11.2* 12.2   < > 10.3* 10.9* 12.9  HCT 35.2*   < > 35.4* 32.7* 35.2*   < > 31* 32* 38  MCV 92.9   < > 90.8 94.5 94.4  --   --   --   --   PLT 214   < > 280 231 220   < > 207 211 207   < > = values in this interval not displayed.   Lab Results  Component Value Date   TSH 1.07 10/04/2017   No results found for: HGBA1C No results found for: CHOL, HDL, LDLCALC, LDLDIRECT, TRIG, CHOLHDL  Significant Diagnostic Results in last 30 days:  No results found.  Assessment/Plan  Hypertensive heart disease With diastolic CHF. Currently on  metoprolol tartrate 12.5 mg bid. Few elevated but overall stable BP. No changes for now given her fall risk to avoid hypotension.   Unsteady gait High fall risk. Continue to use her walker. Fall precautions in place with low positioning of bed.   Allergic rhinitis Continue fexofenadine but change from 180 mg daily as needed to daily scheduled for 1 week and then prn  Mixed obsessional thoughts Calm this visit. Risperidone 0.25 mg bid has been helpful. Monitor her behavior.  afib Controlled HR, continue metoprolol tartrate for rate control and apixaban for stroke prophylaxis. No bleed reported. High fall risk.    Family/ staff Communication: reviewed care plan with patient and charge nurse.   Labs/tests ordered:  none   Oneal GroutMAHIMA Neoma Uhrich, MD Internal Medicine [facility] [address] [place], [place] [address] Cell Phone (Monday-Friday 8 am - 5 pm): [phone] On Call: [phone] and follow prompts after 5 pm and on weekends Office Phone: [phone] Office Fax: [phone]

## 2017-11-30 ENCOUNTER — Non-Acute Institutional Stay (SKILLED_NURSING_FACILITY): Payer: Medicare Other | Admitting: Family

## 2017-11-30 ENCOUNTER — Encounter: Payer: Self-pay | Admitting: Family

## 2017-11-30 DIAGNOSIS — I5032 Chronic diastolic (congestive) heart failure: Secondary | ICD-10-CM

## 2017-12-03 LAB — BASIC METABOLIC PANEL
BUN: 19 (ref 4–21)
CREATININE: 0.9 (ref 0.5–1.1)
Glucose: 152
Potassium: 4.1 (ref 3.4–5.3)
Sodium: 133 — AB (ref 137–147)

## 2017-12-05 ENCOUNTER — Telehealth: Payer: Self-pay | Admitting: *Deleted

## 2017-12-05 NOTE — Telephone Encounter (Signed)
[name]' daughter, Dr. Gweneth DimitriWendy Obrien, calling to make sure that she did not have an ICD. [name] only has a dual chamber PPM.  [name] is electing to manage her CHF from home and is back in AF according to her daughter.

## 2017-12-07 ENCOUNTER — Encounter: Payer: Self-pay | Admitting: Family

## 2017-12-07 ENCOUNTER — Non-Acute Institutional Stay (SKILLED_NURSING_FACILITY): Payer: Medicare Other | Admitting: Family

## 2017-12-07 DIAGNOSIS — I110 Hypertensive heart disease with heart failure: Secondary | ICD-10-CM

## 2017-12-07 DIAGNOSIS — I5032 Chronic diastolic (congestive) heart failure: Secondary | ICD-10-CM

## 2017-12-07 DIAGNOSIS — E039 Hypothyroidism, unspecified: Secondary | ICD-10-CM

## 2017-12-07 DIAGNOSIS — I480 Paroxysmal atrial fibrillation: Secondary | ICD-10-CM | POA: Diagnosis not present

## 2017-12-07 NOTE — Progress Notes (Signed)
Location:  [facility] Room Number: 6 Place of Service:  SNF (31) Provider: Jakorian Marengo FNP-C   Oneal Grout, MD  Patient Care Team: Oneal Grout, MD as PCP - General (Internal Medicine) Duke Salvia, MD as Consulting Physician (Cardiology) Corky Crafts, MD as Consulting Physician (Interventional Cardiology) Sheral Apley, MD as Attending Physician (Orthopedic Surgery)  Extended Emergency Contact Information Primary Emergency Contact: McNeill,Dr. Robin Searing [place] Home Phone: [phone] Work Phone: [phone] Mobile Phone: [phone] Relation: Daughter  Code Status:  DNR Goals of care: Advanced Directive information Advanced Directives 12/07/2017  Does Patient Have a Medical Advance Directive? Yes  Type of Estate agent of [place];Living will;Out of facility DNR (pink MOST or yellow form)  Does patient want to make changes to medical advance directive? No - Patient declined  Copy of Healthcare Power of Attorney in Chart? Yes  Would patient like information on creating a medical advance directive? -  Pre-existing out of facility DNR order (yellow form or pink MOST form) Yellow form placed in chart (order not valid for inpatient use);Pink MOST form placed in chart (order not valid for inpatient use)     Chief Complaint  Patient presents with  . Medical Management of Chronic Issues    Routine Visit     HPI:  Pt is a 83 y.o. female seen today [facility] for medical management of chronic diseases.she has a medical history of HTN, Afib, CHF, TIA, hypothyroidism,CKD,Obsessive thoughts among other conditions.she is seen in her room today with facility Nurse presence at bedside.she denies any acute issuses this visit. Nurse reports patient has been feeling better from recent shortness of breath.she has been going to the dinning room on wheelchair for meals.No recent fall episodes or weight  changes.   Past Medical History:  Diagnosis Date  . Acute encephalopathy 03/24/2017  . Acute on chronic diastolic heart failure (HCC)   . AICD (automatic cardioverter/defibrillator) present   . Arthritis    "plenty" (06/17/2015)  . Atrial fibrillation (HCC)   . CHF (congestive heart failure) (HCC)   . Chronic back pain   . Chronic kidney disease    02/17/16 Bun 34, creat 1.05  . Complication of anesthesia    "30 minutes violent shaking after a dental procedure"   . Degenerative disc disease, lumbar    . Diastolic heart failure (HCC)   . Gluten intolerance    . Heart murmur   . Hypertension   . Hypothyroidism    "from the aminodarone"  . Idiopathic scoliosis    . Osteoporosis   . Paroxysmal atrial fibrillation (HCC)   . Pleural effusion 2011   "hugh w/pneumonia"  . Pneumonia 2011; 11/2014  . Sinus bradycardia   . Stroke [facility])    TIA  . Thyroid disease   . TIA (transient ischemic attack) 2000's X 1  . Trigeminal neuralgia of left side of face 12/13/2014  . Unstable gait     Past Surgical History:  Procedure Laterality Date  . BI-VENTRICULAR IMPLANTABLE CARDIOVERTER DEFIBRILLATOR  (CRT-D)  06/17/2015  . CATARACT EXTRACTION W/ INTRAOCULAR LENS  IMPLANT, BILATERAL Bilateral 1991-1992  . EP IMPLANTABLE DEVICE N/A 06/17/2015   Procedure: Pacemaker Implant;  Surgeon: Duke Salvia, MD;  Location: [facility] INVASIVE CV LAB;  Service: Cardiovascular;  Laterality: N/A;  . LOOP RECORDER IMPLANT  08/2013   explanted 06/17/2015  . PACEMAKER INSERTION    . TOENAIL AVULSION Left    2nd digit  .  TONSILLECTOMY  1944    Allergies  Allergen Reactions  . Codeine Other (See Comments)    unknown  . Codeine Nausea And Vomiting    & headaches  . Levaquin [Levofloxacin In D5w] Itching  . Levaquin [Levofloxacin In D5w] Swelling and Other (See Comments)    & redness  . Loratadine Other (See Comments)    TACHYCARDIA  . Xylocaine [Lidocaine Hcl] Other (See Comments)    Uncontrolled shaking  .  Xylocaine [Lidocaine Hcl] Other (See Comments)    Made her very cold & lots of shaking. Pt and daughter stated that she can take it.  . Loratadine Palpitations    Allergies as of 12/07/2017      Reactions   Codeine Other (See Comments)   unknown   Codeine Nausea And Vomiting   & headaches   Levaquin [levofloxacin In D5w] Itching   Levaquin [levofloxacin In D5w] Swelling, Other (See Comments)   & redness   Loratadine Other (See Comments)   TACHYCARDIA   Xylocaine [lidocaine Hcl] Other (See Comments)   Uncontrolled shaking   Xylocaine [lidocaine Hcl] Other (See Comments)   Made her very cold & lots of shaking. Pt and daughter stated that she can take it.   Loratadine Palpitations      Medication List        Accurate as of 12/07/17  4:19 PM. Always use your most recent med list.          acetaminophen 325 MG tablet Commonly known as:  TYLENOL Take 650 mg by mouth every 6 (six) hours as needed for mild pain or fever.   AMERIGEL CARE Lotn Apply 1 application topically daily as needed.   apixaban 2.5 MG Tabs tablet Commonly known as:  ELIQUIS Take 1 tablet (2.5 mg total) by mouth 2 (two) times daily.   ARTIFICIAL TEAR OP Apply 1 drop to eye every 6 (six) hours as needed. For dry eyes   fexofenadine 180 MG tablet Commonly known as:  ALLEGRA Take 180 mg by mouth daily as needed.   furosemide 20 MG tablet Commonly known as:  LASIX Take 20 mg by mouth daily.   levothyroxine 75 MCG tablet Commonly known as:  SYNTHROID, LEVOTHROID Take 1 tablet (75 mcg total) by mouth daily before breakfast. Reported on 02/04/2016   magnesium hydroxide 400 MG/5ML suspension Commonly known as:  MILK OF MAGNESIA Take 30 mLs by mouth as needed for mild constipation.   metoprolol tartrate 25 MG tablet Commonly known as:  LOPRESSOR Take 0.5 tablets (12.5 mg total) by mouth 2 (two) times daily.       Review of Systems  Constitutional: Negative for activity change, appetite change,  chills, fatigue and fever.  HENT: Negative for congestion, rhinorrhea, sinus pressure, sinus pain, sneezing, sore throat and trouble swallowing.   Eyes: Negative for redness and itching.       Wears eye glasses   Respiratory: Negative for cough, chest tightness, shortness of breath and wheezing.   Cardiovascular: Positive for leg swelling. Negative for chest pain and palpitations.  Gastrointestinal: Negative for abdominal distention, abdominal pain, constipation, diarrhea, nausea and vomiting.  Endocrine: Negative for cold intolerance, heat intolerance, polydipsia, polyphagia and polyuria.  Genitourinary: Negative for dysuria, flank pain, frequency and urgency.  Musculoskeletal: Positive for gait problem.  Skin: Negative for color change, pallor, rash and wound.  Neurological: Negative for dizziness, seizures, syncope, light-headedness and headaches.  Hematological: Does not bruise/bleed easily.  Psychiatric/Behavioral: Negative for agitation, confusion, hallucinations and sleep disturbance.  The patient is not nervous/anxious.     Immunization History  Administered Date(s) Administered  . Influenza-Unspecified 10/08/2014, 09/23/2015, 10/05/2016, 10/04/2017  . PPD Test 06/22/2014, 06/25/2014, 06/11/2015, 01/11/2016  . Pneumococcal Polysaccharide-23 08/21/2017  . Td 03/05/2014   Pertinent  Health Maintenance Due  Topic Date Due  . DEXA SCAN  10/27/2020 (Originally 01/18/1986)  . PNA vac Low Risk Adult (2 of 2 - PCV13) 08/21/2018  . INFLUENZA VACCINE  Completed   Fall Risk  08/15/2017 02/18/2016 04/24/2015  Falls in the past year? No No Yes  Number falls in past yr: - - 2 or more  Injury with Fall? - - Yes  Risk Factor Category  - - High Fall Risk  Risk for fall due to : - - History of fall(s);Impaired balance/gait  Follow up - - Falls evaluation completed    Vitals:   12/07/17 1330  BP: 120/70  Pulse: 76  Resp: 16  Temp: 98.4 F (36.9 C)  TempSrc: Oral  SpO2: 95%  Weight: 98  lb (44.5 kg)  Height: 4\' 7"  (1.397 m)   Body mass index is 22.78 kg/m. Physical Exam  Constitutional: She is oriented to person, place, and time.  Frail elderly in no acute distress  HENT:  Head: Normocephalic.  Right Ear: External ear normal.  Left Ear: External ear normal.  Mouth/Throat: Oropharynx is clear and moist. No oropharyngeal exudate.  HOH.Hearing aids in place.    Eyes: Conjunctivae and EOM are normal. Pupils are equal, round, and reactive to light. Right eye exhibits no discharge. Left eye exhibits no discharge. No scleral icterus.  eye glasses in place.  Neck: Normal range of motion. No JVD present. No thyromegaly present.  Cardiovascular: Intact distal pulses. Exam reveals no gallop and no friction rub.  Murmur heard. Pulmonary/Chest: Effort normal and breath sounds normal. No respiratory distress. She has no wheezes. She has no rales.  Abdominal: Soft. Bowel sounds are normal. She exhibits no distension. There is no tenderness. There is no rebound and no guarding.  Musculoskeletal: She exhibits no tenderness.  Unsteady gait uses Rolator. Bilateral lower extremities knee high ted hose in place 1-2+ edema noted.   Lymphadenopathy:    She has no cervical adenopathy.  Neurological: She is oriented to person, place, and time. Coordination normal.  Skin: Skin is warm and dry. No rash noted. No erythema. No pallor.  Skin intact.   Psychiatric: She has a normal mood and affect.   Labs reviewed: Recent Labs    03/24/17 1429  04/02/17 2027 04/04/17 0334 04/05/17 0415  08/21/17 09/06/17 12/03/17  NA  --    < > 133* 134* 136   < > 130* 139 133*  K  --    < > 4.6 4.5 5.3*   < > 4.5 4.4 4.1  CL  --    < > 102 105 107  --   --   --   --   CO2  --    < > 24 23 23   --   --   --   --   GLUCOSE  --    < > 107* 84 67  --   --   --   --   BUN  --    < > 14 12 12    < > 22* 31* 19  CREATININE  --    < > 0.65 0.61 0.62   < > 0.8 0.9 0.9  CALCIUM  --    < > 8.9  8.1* 8.3*  --   --    --   --   MG 1.9  --   --   --   --   --   --   --   --   PHOS 3.6  --   --   --   --   --   --   --   --    < > = values in this interval not displayed.   Recent Labs    03/24/17 1138 04/02/17 2027 07/06/17 07/26/17 08/21/17  AST 30 27 22 22 24   ALT 16 20 17 16 19   ALKPHOS 64 68 68 62 50  BILITOT 0.6 0.7  --   --   --   PROT 5.7* 6.2*  --   --   --   ALBUMIN 3.4* 3.8  --   --   --    Recent Labs    04/02/17 2027 04/04/17 0334 04/05/17 0415  07/26/17 08/21/17 09/06/17  WBC 8.2 6.9 9.5   < > 6.6 8.1 10.8  HGB 12.4 11.2* 12.2   < > 10.3* 10.9* 12.9  HCT 35.4* 32.7* 35.2*   < > 31* 32* 38  MCV 90.8 94.5 94.4  --   --   --   --   PLT 280 231 220   < > 207 211 207   < > = values in this interval not displayed.   Lab Results  Component Value Date   TSH 1.07 10/04/2017   No results found for: HGBA1C No results found for: CHOL, HDL, LDLCALC, LDLDIRECT, TRIG, CHOLHDL  Significant Diagnostic Results in last 30 days:  No results found.  Assessment/Plan 1. Hypertensive heart disease with chronic diastolic congestive heart failure B/p stable.continue on metoprolol tartrate 12.5 mg tablet twice daily and Furosemide 20 mg tablet daily.continue to monitor BMP.   2. Chronic diastolic CHF (congestive heart failure) Stable.No recent abrupt weight gain.lungs CTA.Bilateral lower extremities 1-2 + edema.Continue with knee high ted hose on in the morning and off at bedtime.continue on metoprolol tartrate 12.5 mg tablet twice daily and Furosemide 20 mg tablet daily.continue to monitor weight.follow up with Hospice service.   3. Hypothyroidism Lab Results  Component Value Date   TSH 1.07 10/04/2017  Continue on levothyroxine 75 mcg tablet daily. Monitor TSH level.   4. Paroxysmal atrial fibrillation  HR controlled.Continue on apixaban 2.5 mg tablet twice daily and metoprolol tartrate 12.5 mg tablet twice daily.  Family/ staff Communication: Reviewed plan of care with patient and facility  Nurse.   Labs/tests ordered: None   Melvie Paglia C Gery Sabedra, NP

## 2017-12-09 NOTE — Progress Notes (Signed)
Location:  [facility] Room Number: 6 Place of Service:  SNF (31) Provider: Bowman Higbie FNP-C  Oneal GroutPandey, Mahima, MD  Patient Care Team: Oneal GroutPandey, Mahima, MD as PCP - General (Internal Medicine) Duke SalviaKlein, Steven C, MD as Consulting Physician (Cardiology) Corky CraftsVaranasi, Jayadeep S, MD as Consulting Physician (Interventional Cardiology) Sheral ApleyMurphy, Timothy D, MD as Attending Physician (Orthopedic Surgery)  Extended Emergency Contact Information Primary Emergency Contact: McNeill,Dr. Robin SearingWendy  [place] Home Phone: [phone] Work Phone: [phone] Mobile Phone: [phone] Relation: Daughter  Code Status:  DNR Goals of care: Advanced Directive information Advanced Directives 12/07/2017  Does Patient Have a Medical Advance Directive? Yes  Type of Estate agentAdvance Directive Healthcare Power of [place];Living will;Out of facility DNR (pink MOST or yellow form)  Does patient want to make changes to medical advance directive? No - Patient declined  Copy of Healthcare Power of Attorney in Chart? Yes  Would patient like information on creating a medical advance directive? -  Pre-existing out of facility DNR order (yellow form or pink MOST form) Yellow form placed in chart (order not valid for inpatient use);Pink MOST form placed in chart (order not valid for inpatient use)     Chief Complaint  Patient presents with  . Acute Visit    shortness of breath    HPI:  Pt is a 83 [facility]. [facility] evaluation of shortness of breath.she has a significant medical history of CHF,HTN,Afib among other conditions.she is seen in her room today per facility nurse request. Nurse reports patient was short of breath during the night. Patient's daughter Gweneth DimitriWendy McNeill requested diuretic to be initiated but does not want any chest x-ray ordered.she is currently under hospice service.On call provider was notified, Furosemide 20 mg tablet x 1 dose  given with much improvement.she denies any shortness of breath,wheezing, cough,fever or chills during visit.    Past Medical History:  Diagnosis Date  . Acute encephalopathy 03/24/2017  . Acute on chronic diastolic heart failure (HCC)   . AICD (automatic cardioverter/defibrillator) present   . Arthritis    "plenty" (06/17/2015)  . Atrial fibrillation (HCC)   . CHF (congestive heart failure) (HCC)   . Chronic back pain   . Chronic kidney disease    02/17/16 Bun 34, creat 1.05  . Complication of anesthesia    "30 minutes violent shaking after a dental procedure"   . Degenerative disc disease, lumbar    . Diastolic heart failure (HCC)   . Gluten intolerance    . Heart murmur   . Hypertension   . Hypothyroidism    "from the aminodarone"  . Idiopathic scoliosis    . Osteoporosis   . Paroxysmal atrial fibrillation (HCC)   . Pleural effusion 2011   "hugh w/pneumonia"  . Pneumonia 2011; 11/2014  . Sinus bradycardia   . Stroke [facility](HCC)    TIA  . Thyroid disease   . TIA (transient ischemic attack) 2000's X 1  . Trigeminal neuralgia of left side of face 12/13/2014  . Unstable gait     Past Surgical History:  Procedure Laterality Date  . BI-VENTRICULAR IMPLANTABLE CARDIOVERTER DEFIBRILLATOR  (CRT-D)  06/17/2015  . CATARACT EXTRACTION W/ INTRAOCULAR LENS  IMPLANT, BILATERAL Bilateral 1991-1992  . EP IMPLANTABLE DEVICE N/A 06/17/2015   Procedure: Pacemaker Implant;  Surgeon: Duke SalviaSteven C Klein, MD;  [facility] LAB;  Service: Cardiovascular;  Laterality: N/A;  . LOOP RECORDER IMPLANT  08/2013   explanted  06/17/2015  . PACEMAKER INSERTION    . TOENAIL AVULSION Left    2nd digit  . TONSILLECTOMY  1944    Allergies  Allergen Reactions  . Codeine Other (See Comments)    unknown  . Codeine Nausea And Vomiting    & headaches  . Levaquin [Levofloxacin In D5w] Itching  . Levaquin [Levofloxacin In D5w] Swelling and Other (See Comments)    & redness  . Loratadine Other (See  Comments)    TACHYCARDIA  . Xylocaine [Lidocaine Hcl] Other (See Comments)    Uncontrolled shaking  . Xylocaine [Lidocaine Hcl] Other (See Comments)    Made her very cold & lots of shaking. Pt and daughter stated that she can take it.  . Loratadine Palpitations    Outpatient Encounter Medications as of 11/30/2017  Medication Sig  . acetaminophen (TYLENOL) 325 MG tablet Take 650 mg by mouth every 6 (six) hours as needed for mild pain or fever.   Marland Kitchen. apixaban (ELIQUIS) 2.5 MG TABS tablet Take 1 tablet (2.5 mg total) by mouth 2 (two) times daily.  . ARTIFICIAL TEAR OP Apply 1 drop to eye every 6 (six) hours as needed. For dry eyes  . fexofenadine (ALLEGRA) 180 MG tablet Take 180 mg by mouth daily as needed.   Marland Kitchen. levothyroxine (SYNTHROID, LEVOTHROID) 75 MCG tablet Take 1 tablet (75 mcg total) by mouth daily before breakfast. Reported on 02/04/2016  . metoprolol tartrate (LOPRESSOR) 25 MG tablet Take 0.5 tablets (12.5 mg total) by mouth 2 (two) times daily.  . Skin Protectants, Misc. (AMERIGEL CARE) LOTN Apply 1 application topically daily as needed.   . [DISCONTINUED] risperiDONE (RISPERDAL) 0.25 MG tablet Take 0.25 mg by mouth 2 (two) times daily.   . [DISCONTINUED] erythromycin ophthalmic ointment Place 1 application into both eyes. Every 3 days at bedtime.   No facility-administered encounter medications on file as of 11/30/2017.     Review of Systems  Constitutional: Negative for activity change, appetite change, chills, fatigue and fever.  Respiratory: Negative for cough, chest tightness, shortness of breath and wheezing.        Shortness of breath overnight per HPI  Cardiovascular: Positive for leg swelling. Negative for chest pain and palpitations.  Gastrointestinal: Negative for abdominal distention, abdominal pain, constipation, diarrhea, nausea and vomiting.  Musculoskeletal: Positive for gait problem.  Skin: Negative for color change, pallor and rash.  Psychiatric/Behavioral:  Negative for agitation, confusion and sleep disturbance. The patient is not nervous/anxious.     Immunization History  Administered Date(s) Administered  . Influenza-Unspecified 10/08/2014, 09/23/2015, 10/05/2016, 10/04/2017  . PPD Test 06/22/2014, 06/25/2014, 06/11/2015, 01/11/2016  . Pneumococcal Polysaccharide-23 08/21/2017  . Td 03/05/2014   Pertinent  Health Maintenance Due  Topic Date Due  . DEXA SCAN  10/27/2020 (Originally 01/18/1986)  . PNA vac Low Risk Adult (2 of 2 - PCV13) 08/21/2018  . INFLUENZA VACCINE  Completed   Fall Risk  08/15/2017 02/18/2016 04/24/2015  Falls in the past year? No No Yes  Number falls in past yr: - - 2 or more  Injury with Fall? - - Yes  Risk Factor Category  - - High Fall Risk  Risk for fall due to : - - History of fall(s);Impaired balance/gait  Follow up - - Falls evaluation completed    Vitals:   11/30/17 1028  BP: 130/88  Pulse: 88  Resp: 19  Temp: (!) 97.4 F (36.3 C)  SpO2: 92%  Weight: 98 lb (44.5 kg)  Height: 4\' 7"  (1.397 m)  Body mass index is 22.78 kg/m. Physical Exam  Constitutional: She is oriented to person, place, and time.  Frail elderly in no acute distress   HENT:  Head: Normocephalic.  Mouth/Throat: Oropharynx is clear and moist. No oropharyngeal exudate.  Hearing aids in place  Eyes: Conjunctivae and EOM are normal. Pupils are equal, round, and reactive to light. Right eye exhibits no discharge. Left eye exhibits no discharge. No scleral icterus.  Eye glasses in place.  Neck: Normal range of motion. No JVD present. No thyromegaly present.  Cardiovascular: Intact distal pulses. Exam reveals no gallop and no friction rub.  Murmur heard. Pulmonary/Chest: Effort normal and breath sounds normal. No respiratory distress. She has no wheezes. She has no rales.  Abdominal: Soft. Bowel sounds are normal. She exhibits no distension. There is no tenderness. There is no rebound and no guarding.  Musculoskeletal: She exhibits  no tenderness.  Unsteady gait uses Rolator.Bilateral lower extremities 3+ edema.   Lymphadenopathy:    She has no cervical adenopathy.  Neurological: She is oriented to person, place, and time.  Skin: Skin is warm and dry. No rash noted. No erythema.  Psychiatric: She has a normal mood and affect.   Labs reviewed: Recent Labs    03/24/17 1429  04/02/17 2027 04/04/17 0334 04/05/17 0415  08/21/17 09/06/17 12/03/17  NA  --    < > 133* 134* 136   < > 130* 139 133*  K  --    < > 4.6 4.5 5.3*   < > 4.5 4.4 4.1  CL  --    < > 102 105 107  --   --   --   --   CO2  --    < > 24 23 23   --   --   --   --   GLUCOSE  --    < > 107* 84 67  --   --   --   --   BUN  --    < > 14 12 12    < > 22* 31* 19  CREATININE  --    < > 0.65 0.61 0.62   < > 0.8 0.9 0.9  CALCIUM  --    < > 8.9 8.1* 8.3*  --   --   --   --   MG 1.9  --   --   --   --   --   --   --   --   PHOS 3.6  --   --   --   --   --   --   --   --    < > = values in this interval not displayed.   Recent Labs    03/24/17 1138 04/02/17 2027 07/06/17 07/26/17 08/21/17  AST 30 27 22 22 24   ALT 16 20 17 16 19   ALKPHOS 64 68 68 62 50  BILITOT 0.6 0.7  --   --   --   PROT 5.7* 6.2*  --   --   --   ALBUMIN 3.4* 3.8  --   --   --    Recent Labs    04/02/17 2027 04/04/17 0334 04/05/17 0415  07/26/17 08/21/17 09/06/17  WBC 8.2 6.9 9.5   < > 6.6 8.1 10.8  HGB 12.4 11.2* 12.2   < > 10.3* 10.9* 12.9  HCT 35.4* 32.7* 35.2*   < > 31* 32* 38  MCV 90.8 94.5 94.4  --   --   --   --  PLT 280 231 220   < > 207 211 207   < > = values in this interval not displayed.   Lab Results  Component Value Date   TSH 1.07 10/04/2017    Significant Diagnostic Results in last 30 days:  No results found.  Assessment/Plan  Chronic diastolic CHF (congestive heart failure) Shortness of breath reported overnight with much relief with one time dose of furosemide ordered by on call provider.Exam findings negative for cough,wheezing or rales. Bilateral  lower extremities 3+ edema noted.Start Furosemide 20 mg tablet one by mouth daily. Continue to monitor weight.   Family/ staff Communication: Reviewed plan of care with patient and facility Nurse. Labs/tests ordered: None   Karoline Fleer C Dennard Vezina, NP

## 2017-12-31 ENCOUNTER — Non-Acute Institutional Stay (SKILLED_NURSING_FACILITY): Payer: Medicare Other | Admitting: Family

## 2017-12-31 ENCOUNTER — Encounter: Payer: Self-pay | Admitting: Family

## 2017-12-31 DIAGNOSIS — H1033 Unspecified acute conjunctivitis, bilateral: Secondary | ICD-10-CM

## 2017-12-31 NOTE — Progress Notes (Signed)
Location:  [facility] Room Number: 6 Place of Service:  SNF (31) Provider: Anab Vivar FNP-C  Oneal Grout, MD  Patient Care Team: Oneal Grout, MD as PCP - General (Internal Medicine) Duke Salvia, MD as Consulting Physician (Cardiology) Corky Crafts, MD as Consulting Physician (Interventional Cardiology) Sheral Apley, MD as Attending Physician (Orthopedic Surgery)  Extended Emergency Contact Information Primary Emergency Contact: McNeill,Dr. Robin Searing [place] Home Phone: [phone] Work Phone: [phone] Mobile Phone: [phone] Relation: Daughter  Code Status:  DNR Goals of care: Advanced Directive information Advanced Directives 12/31/2017  Does Patient Have a Medical Advance Directive? Yes  Type of Advance Directive Out of facility DNR (pink MOST or yellow form);Living will  Does patient want to make changes to medical advance directive? -  Copy of Healthcare Power of Attorney in Chart? -  Would patient like information on creating a medical advance directive? -  Pre-existing out of facility DNR order (yellow form or pink MOST form) Yellow form placed in chart (order not valid for inpatient use);Pink MOST form placed in chart (order not valid for inpatient use)     Chief Complaint  Patient presents with  . Acute Visit    eye drainage    HPI:  Pt is a 84 y.o. female seen today at [facility] for an acute visit for evaluation of eye redness and drainage from the eyes for the past three days.she is seen in her room today per facility Nurse report.Nurse reports patient's drainage was worst over the weekend patient's POA had to clean patient's eyes due to inability to open eyes.Patient denies itching or pain in the eyes but has to wipe occassionally.she denies any fever,chills,sneezing or coughing.   Past Medical History:  Diagnosis Date  . Acute encephalopathy 03/24/2017  . Acute on chronic diastolic  heart failure (HCC)   . AICD (automatic cardioverter/defibrillator) present   . Arthritis    "plenty" (06/17/2015)  . Atrial fibrillation (HCC)   . CHF (congestive heart failure) (HCC)   . Chronic back pain   . Chronic kidney disease    02/17/16 Bun 34, creat 1.05  . Complication of anesthesia    "30 minutes violent shaking after a dental procedure"   . Degenerative disc disease, lumbar    . Diastolic heart failure (HCC)   . Gluten intolerance    . Heart murmur   . Hypertension   . Hypothyroidism    "from the aminodarone"  . Idiopathic scoliosis    . Osteoporosis   . Paroxysmal atrial fibrillation (HCC)   . Pleural effusion 2011   "hugh w/pneumonia"  . Pneumonia 2011; 11/2014  . Sinus bradycardia   . Stroke [facility])    TIA  . Thyroid disease   . TIA (transient ischemic attack) 2000's X 1  . Trigeminal neuralgia of left side of face 12/13/2014  . Unstable gait     Past Surgical History:  Procedure Laterality Date  . BI-VENTRICULAR IMPLANTABLE CARDIOVERTER DEFIBRILLATOR  (CRT-D)  06/17/2015  . CATARACT EXTRACTION W/ INTRAOCULAR LENS  IMPLANT, BILATERAL Bilateral 1991-1992  . EP IMPLANTABLE DEVICE N/A 06/17/2015   Procedure: Pacemaker Implant;  Surgeon: Duke Salvia, MD;  Location: [facility] INVASIVE CV LAB;  Service: Cardiovascular;  Laterality: N/A;  . LOOP RECORDER IMPLANT  08/2013   explanted 06/17/2015  . PACEMAKER INSERTION    . TOENAIL AVULSION Left    2nd digit  . TONSILLECTOMY  1944    Allergies  Allergen  Reactions  . Codeine Other (See Comments)    unknown  . Codeine Nausea And Vomiting    & headaches  . Levaquin [Levofloxacin In D5w] Itching  . Levaquin [Levofloxacin In D5w] Swelling and Other (See Comments)    & redness  . Loratadine Other (See Comments)    TACHYCARDIA  . Xylocaine [Lidocaine Hcl] Other (See Comments)    Uncontrolled shaking  . Xylocaine [Lidocaine Hcl] Other (See Comments)    Made her very cold & lots of shaking. Pt and daughter stated that she  can take it.  . Loratadine Palpitations    Outpatient Encounter Medications as of 12/31/2017  Medication Sig  . acetaminophen (TYLENOL) 325 MG tablet Take 650 mg by mouth every 6 (six) hours as needed for mild pain or fever.   Marland Kitchen. apixaban (ELIQUIS) 2.5 MG TABS tablet Take 1 tablet (2.5 mg total) by mouth 2 (two) times daily.  . ARTIFICIAL TEAR OP Apply 1 drop to eye every 6 (six) hours as needed. For dry eyes  . fexofenadine (ALLEGRA) 180 MG tablet Take 180 mg by mouth daily as needed.   . furosemide (LASIX) 20 MG tablet Take 20 mg by mouth 2 (two) times daily.   Marland Kitchen. levothyroxine (SYNTHROID, LEVOTHROID) 75 MCG tablet Take 1 tablet (75 mcg total) by mouth daily before breakfast. Reported on 02/04/2016  . LORazepam (ATIVAN) 0.5 MG tablet Take 0.25 mg by mouth every 6 (six) hours as needed for anxiety.  . magnesium hydroxide (MILK OF MAGNESIA) 400 MG/5ML suspension Take 30 mLs by mouth as needed for mild constipation.  . metoprolol tartrate (LOPRESSOR) 25 MG tablet Take 0.5 tablets (12.5 mg total) by mouth 2 (two) times daily.  . Morphine Sulfate (ROXANOL PO) Take 25 mLs by mouth every 6 (six) hours as needed.  . OXYGEN Inhale 2 L/min into the lungs.  . risperiDONE (RISPERDAL) 0.25 MG tablet Take 0.25 mg by mouth 2 (two) times daily.  . Skin Protectants, Misc. (AMERIGEL CARE) LOTN Apply 1 application topically daily as needed.    No facility-administered encounter medications on file as of 12/31/2017.     Review of Systems  Constitutional: Negative for chills, fatigue and fever.  HENT: Positive for hearing loss. Negative for congestion, postnasal drip, rhinorrhea, sinus pressure, sinus pain, sneezing and sore throat.   Eyes: Positive for discharge and redness. Negative for pain and itching.       Wears eye glasses   Respiratory: Negative for cough, chest tightness, shortness of breath and wheezing.   Cardiovascular: Positive for leg swelling. Negative for chest pain and palpitations.  Skin:  Negative for color change, pallor and rash.  Neurological: Negative for dizziness, light-headedness and headaches.  Psychiatric/Behavioral: Negative for agitation and sleep disturbance. The patient is not nervous/anxious.     Immunization History  Administered Date(s) Administered  . Influenza-Unspecified 10/08/2014, 09/23/2015, 10/05/2016, 10/04/2017  . PPD Test 06/22/2014, 06/25/2014, 06/11/2015, 01/11/2016  . Pneumococcal Polysaccharide-23 08/21/2017  . Td 03/05/2014   Pertinent  Health Maintenance Due  Topic Date Due  . DEXA SCAN  10/27/2020 (Originally 01/18/1986)  . PNA vac Low Risk Adult (2 of 2 - PCV13) 08/21/2018  . INFLUENZA VACCINE  Completed   Fall Risk  08/15/2017 02/18/2016 04/24/2015  Falls in the past year? No No Yes  Number falls in past yr: - - 2 or more  Injury with Fall? - - Yes  Risk Factor Category  - - High Fall Risk  Risk for fall due to : - -  History of fall(s);Impaired balance/gait  Follow up - - Falls evaluation completed    Vitals:   12/31/17 1141  BP: 114/66  Pulse: 84  Resp: 18  Temp: 97.6 F (36.4 C)  SpO2: 98%  Weight: 97 lb 6.4 oz (44.2 kg)  Height: 4\' 7"  (1.397 m)   Body mass index is 22.64 kg/m. Physical Exam  Constitutional: She is oriented to person, place, and time. She appears well-developed.  Frail elderly in no acute distress   HENT:  Head: Normocephalic.  Mouth/Throat: Oropharynx is clear and moist. No oropharyngeal exudate.  Eyes: EOM are normal. Pupils are equal, round, and reactive to light. Right eye exhibits discharge. Left eye exhibits discharge.  Bilateral conjunctival eye redness   Neck: Normal range of motion. No JVD present. No thyromegaly present.  Cardiovascular: Intact distal pulses. Exam reveals no gallop and no friction rub.  Murmur heard. Pulmonary/Chest: Effort normal and breath sounds normal. No respiratory distress. She has no wheezes. She has no rales.  Abdominal: Soft. Bowel sounds are normal. She exhibits  no distension. There is no tenderness.  Lymphadenopathy:    She has no cervical adenopathy.  Neurological: She is oriented to person, place, and time. Coordination normal.  Skin: Skin is warm and dry. No erythema.  Psychiatric: She has a normal mood and affect.   Labs reviewed: Recent Labs    03/24/17 1429  04/02/17 2027 04/04/17 0334 04/05/17 0415  08/21/17 09/06/17 12/03/17  NA  --    < > 133* 134* 136   < > 130* 139 133*  K  --    < > 4.6 4.5 5.3*   < > 4.5 4.4 4.1  CL  --    < > 102 105 107  --   --   --   --   CO2  --    < > 24 23 23   --   --   --   --   GLUCOSE  --    < > 107* 84 67  --   --   --   --   BUN  --    < > 14 12 12    < > 22* 31* 19  CREATININE  --    < > 0.65 0.61 0.62   < > 0.8 0.9 0.9  CALCIUM  --    < > 8.9 8.1* 8.3*  --   --   --   --   MG 1.9  --   --   --   --   --   --   --   --   PHOS 3.6  --   --   --   --   --   --   --   --    < > = values in this interval not displayed.   Recent Labs    03/24/17 1138 04/02/17 2027 07/06/17 07/26/17 08/21/17  AST 30 27 22 22 24   ALT 16 20 17 16 19   ALKPHOS 64 68 68 62 50  BILITOT 0.6 0.7  --   --   --   PROT 5.7* 6.2*  --   --   --   ALBUMIN 3.4* 3.8  --   --   --    Recent Labs    04/02/17 2027 04/04/17 0334 04/05/17 0415  07/26/17 08/21/17 09/06/17  WBC 8.2 6.9 9.5   < > 6.6 8.1 10.8  HGB 12.4 11.2* 12.2   < > 10.3* 10.9*  12.9  HCT 35.4* 32.7* 35.2*   < > 31* 32* 38  MCV 90.8 94.5 94.4  --   --   --   --   PLT 280 231 220   < > 207 211 207   < > = values in this interval not displayed.   Lab Results  Component Value Date   TSH 1.07 10/04/2017   Significant Diagnostic Results in last 30 days:  No results found.  Assessment/Plan.   Acute bacterial conjunctivitis of both eyes Afebrile.nonpruritic.yellow drainage on eyelashes with conjunctiva redness to both eyes.Facility Nurse to cleanse both eyes with wet warm wash clothe then apply erythromycin ointment 1 cm ribbon to both eyes three times daily  x 7 days. Continue to monitor. Notify provide if worsening or redness not resolved.     Family/ staff Communication: Reviewed plan of care with patient and facility Nurse.   Labs/tests ordered: None   Marisela Line C Aldric Wenzler, NP

## 2018-01-11 ENCOUNTER — Encounter: Payer: Self-pay | Admitting: Internal Medicine

## 2018-01-11 ENCOUNTER — Non-Acute Institutional Stay (SKILLED_NURSING_FACILITY): Payer: Medicare Other | Admitting: Internal Medicine

## 2018-01-11 DIAGNOSIS — F422 Mixed obsessional thoughts and acts: Secondary | ICD-10-CM | POA: Diagnosis not present

## 2018-01-11 DIAGNOSIS — I5032 Chronic diastolic (congestive) heart failure: Secondary | ICD-10-CM | POA: Diagnosis not present

## 2018-01-11 DIAGNOSIS — I480 Paroxysmal atrial fibrillation: Secondary | ICD-10-CM | POA: Diagnosis not present

## 2018-01-11 DIAGNOSIS — J309 Allergic rhinitis, unspecified: Secondary | ICD-10-CM | POA: Diagnosis not present

## 2018-01-11 NOTE — Progress Notes (Signed)
Location:  [facility] Room Number: 6 Place of Service:  SNF [phone](31) Provider:  Oneal GroutMahima Garlin Batdorf MD  Oneal GroutPandey, Edger Husain, MD  Patient Care Team: Oneal GroutPandey, Elmore Hyslop, MD as PCP - General (Internal Medicine) Duke SalviaKlein, Steven C, MD as Consulting Physician (Cardiology) Corky CraftsVaranasi, Jayadeep S, MD as Consulting Physician (Interventional Cardiology) Sheral ApleyMurphy, Timothy D, MD as Attending Physician (Orthopedic Surgery)  Extended Emergency Contact Information Primary Emergency Contact: McNeill,Dr. Robin SearingWendy  [place] Home Phone: [phone] Work Phone: [phone] Mobile Phone: [phone] Relation: Daughter  Code Status:  DNR  Goals of care: Advanced Directive information Advanced Directives 01/11/2018  Does Patient Have a Medical Advance Directive? Yes  Type of Advance Directive Living will;Out of facility DNR (pink MOST or yellow form)  Does patient want to make changes to medical advance directive? No - Patient declined  Copy of Healthcare Power of Attorney in Chart? -  Would patient like information on creating a medical advance directive? -  Pre-existing out of facility DNR order (yellow form or pink MOST form) Yellow form placed in chart (order not valid for inpatient use);Pink MOST form placed in chart (order not valid for inpatient use)     Chief Complaint  Patient presents with  . Medical Management of Chronic Issues    Routine Visit     HPI:  Pt is a 84 y.o. female seen today for medical management of chronic diseases. She is being followed by hospice services. She has been declining. Needs increased assistance with transfers and is wheelchair bound now. She needs assistance with feeding as well. Allergies have been stable. Edema persists but stable. Continues to be on furosemide. She continues to be on apixaban, no fall reported. Anxiety has been overall stable with lorazepam. Mood stable on risperidone. Reviewed BP readings, stable. Does not participate in HPI  or ROS.    Past Medical History:  Diagnosis Date  . Acute encephalopathy 03/24/2017  . Acute on chronic diastolic heart failure (HCC)   . AICD (automatic cardioverter/defibrillator) present   . Arthritis    "plenty" (06/17/2015)  . Atrial fibrillation (HCC)   . CHF (congestive heart failure) (HCC)   . Chronic back pain   . Chronic kidney disease    02/17/16 Bun 34, creat 1.05  . Complication of anesthesia    "30 minutes violent shaking after a dental procedure"   . Degenerative disc disease, lumbar    . Diastolic heart failure (HCC)   . Gluten intolerance    . Heart murmur   . Hypertension   . Hypothyroidism    "from the aminodarone"  . Idiopathic scoliosis    . Osteoporosis   . Paroxysmal atrial fibrillation (HCC)   . Pleural effusion 2011   "hugh w/pneumonia"  . Pneumonia 2011; 11/2014  . Sinus bradycardia   . Stroke [facility](HCC)    TIA  . Thyroid disease   . TIA (transient ischemic attack) 2000's X 1  . Trigeminal neuralgia of left side of face 12/13/2014  . Unstable gait     Past Surgical History:  Procedure Laterality Date  . BI-VENTRICULAR IMPLANTABLE CARDIOVERTER DEFIBRILLATOR  (CRT-D)  06/17/2015  . CATARACT EXTRACTION W/ INTRAOCULAR LENS  IMPLANT, BILATERAL Bilateral 1991-1992  . EP IMPLANTABLE DEVICE N/A 06/17/2015   Procedure: Pacemaker Implant;  Surgeon: Duke SalviaSteven C Klein, MD;  [facility];  Service: Cardiovascular;  Laterality: N/A;  . LOOP RECORDER IMPLANT  08/2013   explanted 06/17/2015  . PACEMAKER INSERTION    . TOENAIL AVULSION  Left    2nd digit  . TONSILLECTOMY  1944    Allergies  Allergen Reactions  . Codeine Other (See Comments)    unknown  . Codeine Nausea And Vomiting    & headaches  . Levaquin [Levofloxacin In D5w] Itching  . Levaquin [Levofloxacin In D5w] Swelling and Other (See Comments)    & redness  . Loratadine Other (See Comments)    TACHYCARDIA  . Xylocaine [Lidocaine Hcl] Other (See Comments)    Uncontrolled shaking  .  Xylocaine [Lidocaine Hcl] Other (See Comments)    Made her very cold & lots of shaking. Pt and daughter stated that she can take it.  . Loratadine Palpitations    Outpatient Encounter Medications as of 01/11/2018  Medication Sig  . acetaminophen (TYLENOL) 325 MG tablet Take 650 mg by mouth every 6 (six) hours as needed for mild pain or fever.   Marland Kitchen apixaban (ELIQUIS) 2.5 MG TABS tablet Take 1 tablet (2.5 mg total) by mouth 2 (two) times daily.  . ARTIFICIAL TEAR OP Apply 1 drop to eye every 6 (six) hours as needed. For dry eyes  . fexofenadine (ALLEGRA) 180 MG tablet Take 180 mg by mouth daily as needed.   . furosemide (LASIX) 20 MG tablet Take 20 mg by mouth 2 (two) times daily.   Marland Kitchen levothyroxine (SYNTHROID, LEVOTHROID) 75 MCG tablet Take 1 tablet (75 mcg total) by mouth daily before breakfast. Reported on 02/04/2016  . LORazepam (ATIVAN) 0.5 MG tablet Take 0.25 mg by mouth every 6 (six) hours as needed for anxiety.  . magnesium hydroxide (MILK OF MAGNESIA) 400 MG/5ML suspension Take 30 mLs by mouth as needed for mild constipation.  . metoprolol tartrate (LOPRESSOR) 25 MG tablet Take 0.5 tablets (12.5 mg total) by mouth 2 (two) times daily.  . Morphine Sulfate (ROXANOL PO) Take 25 mLs by mouth every 6 (six) hours as needed.  . OXYGEN Inhale 2 L/min into the lungs.  . risperiDONE (RISPERDAL) 0.25 MG tablet Take 0.25 mg by mouth 2 (two) times daily.  . Skin Protectants, Misc. (AMERIGEL CARE) LOTN Apply 1 application topically daily as needed.    No facility-administered encounter medications on file as of 01/11/2018.     Review of Systems  Unable to perform ROS: Age (limited participation, obtained mainly from nursing staff)  Constitutional: Positive for fatigue. Negative for appetite change and fever.  HENT: Negative for congestion.   Respiratory: Negative for cough.   Cardiovascular: Negative for chest pain.  Gastrointestinal: Negative for abdominal pain and vomiting.  Genitourinary:  Negative for dysuria.  Musculoskeletal: Positive for gait problem.  Neurological: Negative for dizziness and headaches.  Psychiatric/Behavioral: Positive for behavioral problems and confusion. The patient is nervous/anxious.     Immunization History  Administered Date(s) Administered  . Influenza-Unspecified 10/08/2014, 09/23/2015, 10/05/2016, 10/04/2017  . PPD Test 06/22/2014, 06/25/2014, 06/11/2015, 01/11/2016  . Pneumococcal Polysaccharide-23 08/21/2017  . Td 03/05/2014   Pertinent  Health Maintenance Due  Topic Date Due  . DEXA SCAN  10/27/2020 (Originally 01/18/1986)  . PNA vac Low Risk Adult (2 of 2 - PCV13) 08/21/2018  . INFLUENZA VACCINE  Completed   Fall Risk  08/15/2017 02/18/2016 04/24/2015  Falls in the past year? No No Yes  Number falls in past yr: - - 2 or more  Injury with Fall? - - Yes  Risk Factor Category  - - High Fall Risk  Risk for fall due to : - - History of fall(s);Impaired balance/gait  Follow up - -  Falls evaluation completed   Functional Status Survey:    Vitals:   01/11/18 1202  BP: 140/70  Pulse: 92  Resp: 15  Temp: (!) 97 F (36.1 C)  TempSrc: Oral  SpO2: 94%  Weight: 97 lb 6.4 oz (44.2 kg)  Height: 4\' 7"  (1.397 m)   Body mass index is 22.64 kg/m.   Wt Readings from Last 3 Encounters:  01/11/18 97 lb 6.4 oz (44.2 kg)  12/31/17 97 lb 6.4 oz (44.2 kg)  12/07/17 98 lb (44.5 kg)   Physical Exam  Constitutional:  Thin built, frail, in no acute distress  HENT:  Head: Normocephalic and atraumatic.  Mouth/Throat: Oropharynx is clear and moist.  Eyes: Conjunctivae and EOM are normal. Pupils are equal, round, and reactive to light. Right eye exhibits no discharge. Left eye exhibits no discharge.  Neck: Neck supple.  Cardiovascular: Normal rate and regular rhythm.  Murmur heard. Pulmonary/Chest: Effort normal and breath sounds normal. She has no wheezes.  Abdominal: Soft. Bowel sounds are normal. There is no tenderness.  Musculoskeletal:  She exhibits edema and deformity.  Scoliosis and kyphosis, unsteady gait, uses wheelchair, needs 1-2 person assistance with transfer  Lymphadenopathy:    She has no cervical adenopathy.  Neurological: She is alert.  Skin: Skin is warm and dry. She is not diaphoretic.  Psychiatric:  anxious    Labs reviewed: Recent Labs    03/24/17 1429  04/02/17 2027 04/04/17 0334 04/05/17 0415  08/21/17 09/06/17 12/03/17  NA  --    < > 133* 134* 136   < > 130* 139 133*  K  --    < > 4.6 4.5 5.3*   < > 4.5 4.4 4.1  CL  --    < > 102 105 107  --   --   --   --   CO2  --    < > 24 23 23   --   --   --   --   GLUCOSE  --    < > 107* 84 67  --   --   --   --   BUN  --    < > 14 12 12    < > 22* 31* 19  CREATININE  --    < > 0.65 0.61 0.62   < > 0.8 0.9 0.9  CALCIUM  --    < > 8.9 8.1* 8.3*  --   --   --   --   MG 1.9  --   --   --   --   --   --   --   --   PHOS 3.6  --   --   --   --   --   --   --   --    < > = values in this interval not displayed.   Recent Labs    03/24/17 1138 04/02/17 2027 07/06/17 07/26/17 08/21/17  AST 30 27 22 22 24   ALT 16 20 17 16 19   ALKPHOS 64 68 68 62 50  BILITOT 0.6 0.7  --   --   --   PROT 5.7* 6.2*  --   --   --   ALBUMIN 3.4* 3.8  --   --   --    Recent Labs    04/02/17 2027 04/04/17 0334 04/05/17 0415  07/26/17 08/21/17 09/06/17  WBC 8.2 6.9 9.5   < > 6.6 8.1 10.8  HGB 12.4 11.2* 12.2   < >  10.3* 10.9* 12.9  HCT 35.4* 32.7* 35.2*   < > 31* 32* 38  MCV 90.8 94.5 94.4  --   --   --   --   PLT 280 231 220   < > 207 211 207   < > = values in this interval not displayed.   Lab Results  Component Value Date   TSH 1.07 10/04/2017   No results found for: HGBA1C No results found for: CHOL, HDL, LDLCALC, LDLDIRECT, TRIG, CHOLHDL  Significant Diagnostic Results in last 30 days:  No results found.  Assessment/Plan  Chronic diastolic chf continue lasix 20 mg bid, metoprolol tartrate 12.5 mg bid and monitor  Allergic rhinitis C/w fexofenadine 180 mg  daily as needed  afib Controlled HR, continue apixaban 2.5 mg bid and metoprolol tartrate 12.5 mg bid  OCD Stable. Continue risperidone 0.25 mg bid and prn lorazepam   Family/ staff Communication: reviewed care plan with patient and charge nurse.   Labs/tests ordered:  none   Oneal Grout, MD Internal Medicine [facility] [address] [place], [place] [address] Cell Phone (Monday-Friday 8 am - 5 pm): [phone] On Call: [phone] and follow prompts after 5 pm and on weekends Office Phone: [phone] Office Fax: [phone]

## 2018-01-21 ENCOUNTER — Encounter: Payer: Self-pay | Admitting: Family

## 2018-01-21 ENCOUNTER — Non-Acute Institutional Stay (SKILLED_NURSING_FACILITY): Payer: Medicare Other | Admitting: Family

## 2018-01-21 DIAGNOSIS — I5032 Chronic diastolic (congestive) heart failure: Secondary | ICD-10-CM

## 2018-01-21 DIAGNOSIS — I482 Chronic atrial fibrillation, unspecified: Secondary | ICD-10-CM

## 2018-01-21 DIAGNOSIS — F411 Generalized anxiety disorder: Secondary | ICD-10-CM

## 2018-01-21 NOTE — Progress Notes (Signed)
Location:  [facility] Room Number: 6 Place of Service:  SNF (31) Provider: Dinah Ngetich FNP-C  Oneal GroutPandey, Mahima, MD  Patient Care Team: Oneal GroutPandey, Mahima, MD as PCP - General (Internal Medicine) Duke SalviaKlein, Steven C, MD as Consulting Physician (Cardiology) Corky CraftsVaranasi, Jayadeep S, MD as Consulting Physician (Interventional Cardiology) Sheral ApleyMurphy, Timothy D, MD as Attending Physician (Orthopedic Surgery)  Extended Emergency Contact Information Primary Emergency Contact: McNeill,Dr. Robin SearingWendy  [place] Home Phone: [phone] Work Phone: [phone] Mobile Phone: [phone] Relation: Daughter  Code Status:  DNR Goals of care: Advanced Directive information Advanced Directives 01/21/2018  Does Patient Have a Medical Advance Directive? Yes  Type of Advance Directive Out of facility DNR (pink MOST or yellow form);Living will  Does patient want to make changes to medical advance directive? -  Copy of Healthcare Power of Attorney in Chart? -  Would patient like information on creating a medical advance directive? -  Pre-existing out of facility DNR order (yellow form or pink MOST form) Yellow form placed in chart (order not valid for inpatient use);Pink MOST form placed in chart (order not valid for inpatient use)     Chief Complaint  Patient presents with  . Acute Visit    increased anxiety    HPI:  Pt is a 84 y.o. female [facility] an acute visit for evaluation of increased anxiety.she is seen in her room today per facility Nurse request.Nurse reports patient has been more anxious.also patient's daughter requests Eliquis to be discontinued " Not doing any good for patient since patient is currently on hospice service". Hospice nurse was in later during the visit and Eliquis was discontinued by Hospice MD.    Past Medical History:  Diagnosis Date  . Acute encephalopathy 03/24/2017  . Acute on chronic diastolic heart failure (HCC)     . AICD (automatic cardioverter/defibrillator) present   . Arthritis    "plenty" (06/17/2015)  . Atrial fibrillation (HCC)   . CHF (congestive heart failure) (HCC)   . Chronic back pain   . Chronic kidney disease    02/17/16 Bun 34, creat 1.05  . Complication of anesthesia    "30 minutes violent shaking after a dental procedure"   . Degenerative disc disease, lumbar    . Diastolic heart failure (HCC)   . Gluten intolerance    . Heart murmur   . Hypertension   . Hypothyroidism    "from the aminodarone"  . Idiopathic scoliosis    . Osteoporosis   . Paroxysmal atrial fibrillation (HCC)   . Pleural effusion 2011   "hugh w/pneumonia"  . Pneumonia 2011; 11/2014  . Sinus bradycardia   . Stroke [facility](HCC)    TIA  . Thyroid disease   . TIA (transient ischemic attack) 2000's X 1  . Trigeminal neuralgia of left side of face 12/13/2014  . Unstable gait     Past Surgical History:  Procedure Laterality Date  . BI-VENTRICULAR IMPLANTABLE CARDIOVERTER DEFIBRILLATOR  (CRT-D)  06/17/2015  . CATARACT EXTRACTION W/ INTRAOCULAR LENS  IMPLANT, BILATERAL Bilateral 1991-1992  . EP IMPLANTABLE DEVICE N/A 06/17/2015   Procedure: Pacemaker Implant;  Surgeon: Duke SalviaSteven C Klein, MD;  [facility];  Service: Cardiovascular;  Laterality: N/A;  . LOOP RECORDER IMPLANT  08/2013   explanted 06/17/2015  . PACEMAKER INSERTION    . TOENAIL AVULSION Left    2nd digit  . TONSILLECTOMY  1944    Allergies  Allergen Reactions  . Codeine  Other (See Comments)    unknown  . Codeine Nausea And Vomiting    & headaches  . Levaquin [Levofloxacin In D5w] Itching  . Levaquin [Levofloxacin In D5w] Swelling and Other (See Comments)    & redness  . Loratadine Other (See Comments)    TACHYCARDIA  . Xylocaine [Lidocaine Hcl] Other (See Comments)    Uncontrolled shaking  . Xylocaine [Lidocaine Hcl] Other (See Comments)    Made her very cold & lots of shaking. Pt and daughter stated that she can take it.  .  Loratadine Palpitations    Outpatient Encounter Medications as of 01/21/2018  Medication Sig  . acetaminophen (TYLENOL) 325 MG tablet Take 650 mg by mouth every 6 (six) hours as needed for mild pain or fever.   Marland Kitchen apixaban (ELIQUIS) 2.5 MG TABS tablet Take 1 tablet (2.5 mg total) by mouth 2 (two) times daily.  . ARTIFICIAL TEAR OP Apply 1 drop to eye every 6 (six) hours as needed. For dry eyes  . fexofenadine (ALLEGRA) 180 MG tablet Take 180 mg by mouth daily as needed.   . furosemide (LASIX) 20 MG tablet Take 20 mg by mouth 2 (two) times daily.   Marland Kitchen levothyroxine (SYNTHROID, LEVOTHROID) 75 MCG tablet Take 1 tablet (75 mcg total) by mouth daily before breakfast. Reported on 02/04/2016  . LORazepam (ATIVAN) 0.5 MG tablet Take 0.25 mg by mouth every 6 (six) hours as needed for anxiety.  . magnesium hydroxide (MILK OF MAGNESIA) 400 MG/5ML suspension Take 30 mLs by mouth as needed for mild constipation.  . metoprolol tartrate (LOPRESSOR) 25 MG tablet Take 0.5 tablets (12.5 mg total) by mouth 2 (two) times daily.  . Morphine Sulfate (ROXANOL PO) Take 25 mLs by mouth every 6 (six) hours as needed.  . OXYGEN Inhale 2 L/min into the lungs.  . risperiDONE (RISPERDAL) 0.25 MG tablet Take 0.25 mg by mouth 2 (two) times daily.  . Skin Protectants, Misc. (AMERIGEL CARE) LOTN Apply 1 application topically daily as needed.    No facility-administered encounter medications on file as of 01/21/2018.     Review of Systems  Constitutional: Negative for chills, fatigue and fever.  HENT: Positive for hearing loss. Negative for congestion, rhinorrhea, sinus pressure, sinus pain, sneezing and sore throat.   Eyes: Positive for visual disturbance. Negative for discharge and redness.       Wears eye glasses  Respiratory: Positive for shortness of breath. Negative for cough, chest tightness and wheezing.   Cardiovascular: Positive for leg swelling. Negative for chest pain and palpitations.  Gastrointestinal: Negative  for abdominal distention, abdominal pain, constipation, diarrhea, nausea and vomiting.  Genitourinary: Negative for dysuria, flank pain, frequency and urgency.  Musculoskeletal: Positive for gait problem.  Skin: Negative for color change, pallor and rash.  Psychiatric/Behavioral: Positive for confusion. Negative for agitation and sleep disturbance. The patient is nervous/anxious.     Immunization History  Administered Date(s) Administered  . Influenza-Unspecified 10/08/2014, 09/23/2015, 10/05/2016, 10/04/2017  . PPD Test 06/22/2014, 06/25/2014, 06/11/2015, 01/11/2016  . Pneumococcal Polysaccharide-23 08/21/2017  . Td 03/05/2014   Pertinent  Health Maintenance Due  Topic Date Due  . DEXA SCAN  10/27/2020 (Originally 01/18/1986)  . PNA vac Low Risk Adult (2 of 2 - PCV13) 08/21/2018  . INFLUENZA VACCINE  Completed   Fall Risk  08/15/2017 02/18/2016 04/24/2015  Falls in the past year? No No Yes  Number falls in past yr: - - 2 or more  Injury with Fall? - - Yes  Risk  Factor Category  - - High Fall Risk  Risk for fall due to : - - History of fall(s);Impaired balance/gait  Follow up - - Falls evaluation completed    Vitals:   01/21/18 1157  BP: (!) 143/84  Pulse: 89  Resp: 20  Temp: (!) 96.5 F (35.8 C)  SpO2: 95%  Weight: 97 lb 6.4 oz (44.2 kg)  Height: 4\' 7"  (1.397 m)   Body mass index is 22.64 kg/m. Physical Exam  Constitutional: She appears well-developed.  Elderly in no acute distress   HENT:  Head: Normocephalic.  Mouth/Throat: Oropharynx is clear and moist. No oropharyngeal exudate.  Eyes: Conjunctivae and EOM are normal. Pupils are equal, round, and reactive to light. Right eye exhibits no discharge. Left eye exhibits no discharge. No scleral icterus.  Eye glasses in place   Neck: Normal range of motion. No JVD present. No thyromegaly present.  Cardiovascular: Intact distal pulses. Exam reveals no gallop and no friction rub.  Murmur heard. HR irregular     Pulmonary/Chest: Effort normal. No respiratory distress. She has no wheezes. She exhibits no tenderness.  RLL rales   Abdominal: Soft. Bowel sounds are normal. She exhibits no distension. There is no tenderness. There is no rebound and no guarding.  Musculoskeletal: She exhibits no tenderness.  Unsteady gait uses wheelchair with assistance.bilateral lower and upper extremities third spacing edema.   Lymphadenopathy:    She has no cervical adenopathy.  Neurological: She is alert. Coordination normal.  Confused at her baseline   Skin: Skin is warm and dry. No rash noted. No erythema.  Psychiatric: She has a normal mood and affect. Her behavior is normal.    Labs reviewed: Recent Labs    03/24/17 1429  04/02/17 2027 04/04/17 0334 04/05/17 0415  08/21/17 09/06/17 12/03/17  NA  --    < > 133* 134* 136   < > 130* 139 133*  K  --    < > 4.6 4.5 5.3*   < > 4.5 4.4 4.1  CL  --    < > 102 105 107  --   --   --   --   CO2  --    < > 24 23 23   --   --   --   --   GLUCOSE  --    < > 107* 84 67  --   --   --   --   BUN  --    < > 14 12 12    < > 22* 31* 19  CREATININE  --    < > 0.65 0.61 0.62   < > 0.8 0.9 0.9  CALCIUM  --    < > 8.9 8.1* 8.3*  --   --   --   --   MG 1.9  --   --   --   --   --   --   --   --   PHOS 3.6  --   --   --   --   --   --   --   --    < > = values in this interval not displayed.   Recent Labs    03/24/17 1138 04/02/17 2027 07/06/17 07/26/17 08/21/17  AST 30 27 22 22 24   ALT 16 20 17 16 19   ALKPHOS 64 68 68 62 50  BILITOT 0.6 0.7  --   --   --   PROT 5.7* 6.2*  --   --   --  ALBUMIN 3.4* 3.8  --   --   --    Recent Labs    04/02/17 2027 04/04/17 0334 04/05/17 0415  07/26/17 08/21/17 09/06/17  WBC 8.2 6.9 9.5   < > 6.6 8.1 10.8  HGB 12.4 11.2* 12.2   < > 10.3* 10.9* 12.9  HCT 35.4* 32.7* 35.2*   < > 31* 32* 38  MCV 90.8 94.5 94.4  --   --   --   --   PLT 280 231 220   < > 207 211 207   < > = values in this interval not displayed.   Lab Results   Component Value Date   TSH 1.07 10/04/2017    Significant Diagnostic Results in last 30 days:  No results found.  Assessment/Plan 1. Generalized anxiety disorder Increased anxiety reported by nursing staff.Medication reviewed has required Ativan at least twice daily.Will Discontinue Ativan 0. 5 mg Tablet take 0.25 mg tablet daily PRN and change to 0.25 mg tablet twice daily hold for sedation.continue to monitor.continue on hospice service.   2. Chronic diastolic CHF (congestive heart failure) Wt stable.third spacing edema to lower and upper extremities.currently on Furosemide 20 mg tablet twice daily.Will increase Furosemide to 40 mg tablet at 9 Am and 20 mg tablet at 2 Pm. Recheck BMP in one week. Continue on metoprolol  12.5 mg tablet twice daily. Continue on oxygen 2 liters nasal cannula and Morphine oral solution as needed for shortness of breath and pain.monitor weight.   Family/ staff Communication: Reviewed plan of care with patient and facility Nurse.   Labs/tests ordered: BMP in one week.  Caesar Bookman, NP

## 2018-01-22 ENCOUNTER — Telehealth: Payer: Self-pay | Admitting: Cardiology

## 2018-01-22 NOTE — Telephone Encounter (Signed)
Patient daughter called requesting information about remote transmission that was sent on 01-19-18. Call routed to [employer] RN.

## 2018-01-22 NOTE — Telephone Encounter (Signed)
Spoke with pt's daughter Toniann FailWendy informed her that we received the transmission that she was pacing 23% in the atrium and 6% in the ventricle. Pts daughter stated that pt was in hospice care and that pt was miserable and that they wanted to inquire about turning off the pacemaker. Informed her that the MDs in this practice do not turn off pacemakers. Informed her that even if the pacemaker was turned off the last OV the pt had underlying rhythm of sinus brady. Pts daughter stated that she would call back and speak with Dr.Klein if they decided to pursue this route.

## 2018-01-31 ENCOUNTER — Encounter: Payer: Self-pay | Admitting: Family

## 2018-01-31 ENCOUNTER — Non-Acute Institutional Stay (SKILLED_NURSING_FACILITY): Payer: Medicare Other | Admitting: Family

## 2018-01-31 DIAGNOSIS — F411 Generalized anxiety disorder: Secondary | ICD-10-CM

## 2018-01-31 NOTE — Progress Notes (Signed)
Location:  [facility] Room Number: 6 Place of Service:  SNF (31) Provider: Willaim Mode FNP-C  Oneal Grout, MD  Patient Care Team: Oneal Grout, MD as PCP - General (Internal Medicine) Duke Salvia, MD as Consulting Physician (Cardiology) Corky Crafts, MD as Consulting Physician (Interventional Cardiology) Sheral Apley, MD as Attending Physician (Orthopedic Surgery)  Extended Emergency Contact Information Primary Emergency Contact: McNeill,Dr. Robin S[place] Home Phone: [phone] Work Phone: [phone] Mobile Phone: [phone] Relation: Daughter  Code Status:  DNR Goals of care: Advanced Directive information Advanced Directives 01/31/2018  Does Patient Have a Medical Advance Directive? Yes  Type of Advance Directive Out of facility DNR (pink MOST or yellow form);Living will  Does patient want to make changes to medical advance directive? -  Copy of Healthcare Power of Attorney in Chart? -  Would patient like information on creating a medical advance directive? -  Pre-existing out of facility DNR order (yellow form or pink MOST form) Yellow form placed in chart (order not valid for inpatient use);Pink MOST form placed in chart (order not valid for inpatient use)     Chief Complaint  Patient presents with  . Acute Visit    increasing anxiety    HPI:  Pt is a 84 y.o. female seen today at [facility] for an acute visit for evaluation of increased anxiety.she is seen in her room today per facility Nurse request.Nurse reports patient takes Ativan 0.25 mg tablet twice daily but seems to be more anxious on and off. Anxiety worst at night.Patient described as being " shaky". Nurse also states patient's shaking seems to improve if hospice or nurse sits with patient in the room.She does not provider any information during visit.Patient shaky and crying help !Help! But unable to verbalize what help she needs.     Past Medical History:  Diagnosis Date  . Acute encephalopathy 03/24/2017  . Acute on chronic diastolic heart failure (HCC)   . AICD (automatic cardioverter/defibrillator) present   . Arthritis    "plenty" (06/17/2015)  . Atrial fibrillation (HCC)   . CHF (congestive heart failure) (HCC)   . Chronic back pain   . Chronic kidney disease    02/17/16 Bun 34, creat 1.05  . Complication of anesthesia    "30 minutes violent shaking after a dental procedure"   . Degenerative disc disease, lumbar    . Diastolic heart failure (HCC)   . Gluten intolerance    . Heart murmur   . Hypertension   . Hypothyroidism    "from the aminodarone"  . Idiopathic scoliosis    . Osteoporosis   . Paroxysmal atrial fibrillation (HCC)   . Pleural effusion 2011   "hugh w/pneumonia"  . Pneumonia 2011; 11/2014  . Sinus bradycardia   . Stroke [facility])    TIA  . Thyroid disease   . TIA (transient ischemic attack) 2000's X 1  . Trigeminal neuralgia of left side of face 12/13/2014  . Unstable gait     Past Surgical History:  Procedure Laterality Date  . BI-VENTRICULAR IMPLANTABLE CARDIOVERTER DEFIBRILLATOR  (CRT-D)  06/17/2015  . CATARACT EXTRACTION W/ INTRAOCULAR LENS  IMPLANT, BILATERAL Bilateral 1991-1992  . EP IMPLANTABLE DEVICE N/A 06/17/2015   Procedure: Pacemaker Implant;  Surgeon: Duke Salvia, MD;  Location: [facility] INVASIVE CV LAB;  Service: Cardiovascular;  Laterality: N/A;  . LOOP RECORDER IMPLANT  08/2013   explanted 06/17/2015  . PACEMAKER INSERTION    . TOENAIL  AVULSION Left    2nd digit  . TONSILLECTOMY  1944    Allergies  Allergen Reactions  . Codeine Other (See Comments)    unknown  . Codeine Nausea And Vomiting    & headaches  . Levaquin [Levofloxacin In D5w] Itching  . Levaquin [Levofloxacin In D5w] Swelling and Other (See Comments)    & redness  . Loratadine Other (See Comments)    TACHYCARDIA  . Xylocaine [Lidocaine Hcl] Other (See Comments)    Uncontrolled shaking  . Xylocaine  [Lidocaine Hcl] Other (See Comments)    Made her very cold & lots of shaking. Pt and daughter stated that she can take it.  . Loratadine Palpitations    Outpatient Encounter Medications as of 01/31/2018  Medication Sig  . acetaminophen (TYLENOL) 325 MG tablet Take 650 mg by mouth every 6 (six) hours as needed for mild pain or fever.   . ARTIFICIAL TEAR OP Apply 1 drop to eye every 6 (six) hours as needed. For dry eyes  . fexofenadine (ALLEGRA) 180 MG tablet Take 180 mg by mouth daily as needed.   . furosemide (LASIX) 20 MG tablet Take 20 mg by mouth 2 (two) times daily. Hold if SBP <110  . levothyroxine (SYNTHROID, LEVOTHROID) 75 MCG tablet Take 1 tablet (75 mcg total) by mouth daily before breakfast. Reported on 02/04/2016  . LORazepam (ATIVAN) 0.5 MG tablet Take 0.25 mg by mouth 2 (two) times daily.   . magnesium hydroxide (MILK OF MAGNESIA) 400 MG/5ML suspension Take 30 mLs by mouth as needed for mild constipation.  . metoprolol tartrate (LOPRESSOR) 25 MG tablet Take 0.5 tablets (12.5 mg total) by mouth 2 (two) times daily.  . Morphine Sulfate (ROXANOL PO) Take 25 mLs by mouth every 6 (six) hours as needed.  . OXYGEN Inhale 2 L/min into the lungs.  . risperiDONE (RISPERDAL) 0.25 MG tablet Take 0.25 mg by mouth 2 (two) times daily.  . Skin Protectants, Misc. (AMERIGEL CARE) LOTN Apply 1 application topically daily as needed.   . [DISCONTINUED] apixaban (ELIQUIS) 2.5 MG TABS tablet Take 1 tablet (2.5 mg total) by mouth 2 (two) times daily.   No facility-administered encounter medications on file as of 01/31/2018.     Review of Systems  Unable to perform ROS: Dementia (additional infomation provided by facility Nurse )    Immunization History  Administered Date(s) Administered  . Influenza-Unspecified 10/08/2014, 09/23/2015, 10/05/2016, 10/04/2017  . PPD Test 06/22/2014, 06/25/2014, 06/11/2015, 01/11/2016  . Pneumococcal Polysaccharide-23 08/21/2017  . Td 03/05/2014   Pertinent  Health  Maintenance Due  Topic Date Due  . DEXA SCAN  10/27/2020 (Originally 01/18/1986)  . PNA vac Low Risk Adult (2 of 2 - PCV13) 08/21/2018  . INFLUENZA VACCINE  Completed   Fall Risk  08/15/2017 02/18/2016 04/24/2015  Falls in the past year? No No Yes  Number falls in past yr: - - 2 or more  Injury with Fall? - - Yes  Risk Factor Category  - - High Fall Risk  Risk for fall due to : - - History of fall(s);Impaired balance/gait  Follow up - - Falls evaluation completed   Functional Status Survey:    Vitals:   01/31/18 0958  BP: 120/70  Pulse: 84  Resp: 18  Temp: (!) 96.9 F (36.1 C)  SpO2: 98%  Weight: 98 lb 14.4 oz (44.9 kg)  Height: 4\' 7"  (1.397 m)   Body mass index is 22.99 kg/m. Physical Exam  Constitutional:  Frail elderly in no  acute distress   HENT:  Head: Normocephalic.  Right Ear: External ear normal.  Left Ear: External ear normal.  Mouth/Throat: Oropharynx is clear and moist. No oropharyngeal exudate.  HOH wears hearing aids   Eyes: Conjunctivae and EOM are normal. Pupils are equal, round, and reactive to light. Right eye exhibits no discharge. Left eye exhibits no discharge. No scleral icterus.  Eye glasses in place   Neck: Normal range of motion. No JVD present. No thyromegaly present.  Cardiovascular: Intact distal pulses. Exam reveals no gallop and no friction rub.  Murmur heard. Pulmonary/Chest: Effort normal and breath sounds normal. No respiratory distress. She has no wheezes. She has no rales. She exhibits no tenderness.  Abdominal: Soft. Bowel sounds are normal. She exhibits no distension. There is no tenderness. There is no rebound and no guarding.  Musculoskeletal: She exhibits no tenderness.  Moves x 4 extremities transfers with assistance.bilateral lower extremities knee high ted hose in place. Generalized edema.   Lymphadenopathy:    She has no cervical adenopathy.  Neurological: Coordination normal.  Alert and oriented to person.  Skin: Skin is warm  and dry. No rash noted. No erythema.  Psychiatric: Her behavior is normal.  Anxious and shaky.     Labs reviewed: Recent Labs    03/24/17 1429  04/02/17 2027 04/04/17 0334 04/05/17 0415  08/21/17 09/06/17 12/03/17  NA  --    < > 133* 134* 136   < > 130* 139 133*  K  --    < > 4.6 4.5 5.3*   < > 4.5 4.4 4.1  CL  --    < > 102 105 107  --   --   --   --   CO2  --    < > 24 23 23   --   --   --   --   GLUCOSE  --    < > 107* 84 67  --   --   --   --   BUN  --    < > 14 12 12    < > 22* 31* 19  CREATININE  --    < > 0.65 0.61 0.62   < > 0.8 0.9 0.9  CALCIUM  --    < > 8.9 8.1* 8.3*  --   --   --   --   MG 1.9  --   --   --   --   --   --   --   --   PHOS 3.6  --   --   --   --   --   --   --   --    < > = values in this interval not displayed.   Recent Labs    03/24/17 1138 04/02/17 2027 07/06/17 07/26/17 08/21/17  AST 30 27 22 22 24   ALT 16 20 17 16 19   ALKPHOS 64 68 68 62 50  BILITOT 0.6 0.7  --   --   --   PROT 5.7* 6.2*  --   --   --   ALBUMIN 3.4* 3.8  --   --   --    Recent Labs    04/02/17 2027 04/04/17 0334 04/05/17 0415  07/26/17 08/21/17 09/06/17  WBC 8.2 6.9 9.5   < > 6.6 8.1 10.8  HGB 12.4 11.2* 12.2   < > 10.3* 10.9* 12.9  HCT 35.4* 32.7* 35.2*   < > 31* 32* 38  MCV  90.8 94.5 94.4  --   --   --   --   PLT 280 231 220   < > 207 211 207   < > = values in this interval not displayed.   Lab Results  Component Value Date   TSH 1.07 10/04/2017    Significant Diagnostic Results in last 30 days:  No results found.  Assessment/Plan  Generalized anxiety disorder Increased anxiety and shaking.continue on ativan 0.25 mg tablet twice daily. Add Ativan 0.25 mg tablet daily at bedtime as needed for anxiety.continue on Hospice service.continue to monitor.   Family/ staff Communication: Reviewed plan of care with patient and facility Nurse  Labs/tests ordered: None   Donalee Citrin Averey Koning, NP

## 2018-02-08 ENCOUNTER — Non-Acute Institutional Stay (SKILLED_NURSING_FACILITY): Payer: Medicare Other | Admitting: Family

## 2018-02-08 ENCOUNTER — Encounter: Payer: Self-pay | Admitting: Family

## 2018-02-08 DIAGNOSIS — E039 Hypothyroidism, unspecified: Secondary | ICD-10-CM

## 2018-02-08 DIAGNOSIS — I5032 Chronic diastolic (congestive) heart failure: Secondary | ICD-10-CM | POA: Diagnosis not present

## 2018-02-08 DIAGNOSIS — I480 Paroxysmal atrial fibrillation: Secondary | ICD-10-CM

## 2018-02-08 DIAGNOSIS — R4189 Other symptoms and signs involving cognitive functions and awareness: Secondary | ICD-10-CM | POA: Diagnosis not present

## 2018-02-08 NOTE — Progress Notes (Signed)
Location:  [facility] Room Number: 6 Place of Service:  SNF (31) Provider: Madison Direnzo FNP-C   Oneal Grout, MD  Patient Care Team: Oneal Grout, MD as PCP - General (Internal Medicine) Duke Salvia, MD as Consulting Physician (Cardiology) Corky Crafts, MD as Consulting Physician (Interventional Cardiology) Sheral Apley, MD as Attending Physician (Orthopedic Surgery)  Extended Emergency Contact Information Primary Emergency Contact: McNeill,Dr. Robin Searing [place] Home Phone: [phone] Work Phone: [phone] Mobile Phone: [phone] Relation: Daughter  Code Status:  DNR Goals of care: Advanced Directive information Advanced Directives 02/08/2018  Does Patient Have a Medical Advance Directive? Yes  Type of Advance Directive Out of facility DNR (pink MOST or yellow form);Living will  Does patient want to make changes to medical advance directive? -  Copy of Healthcare Power of Attorney in Chart? -  Would patient like information on creating a medical advance directive? -  Pre-existing out of facility DNR order (yellow form or pink MOST form) Yellow form placed in chart (order not valid for inpatient use);Pink MOST form placed in chart (order not valid for inpatient use)     Chief Complaint  Patient presents with  . Medical Management of Chronic Issues    monthly routine visit    HPI:  Pt is a 84 y.o. female seen today [facility] for medical management of chronic diseases.She is seen in her room today.She has had progressive decline in condition.she continues to follow up with hospice service.No recent fall episode reported. Her weight remains stable. She requires assistance with her ADL's.Facility Nurse reports patient requesting to sleep more until 12 pm.    Past Medical History:  Diagnosis Date  . Acute encephalopathy 03/24/2017  . Acute on chronic diastolic heart failure (HCC)   . AICD (automatic  cardioverter/defibrillator) present   . Arthritis    "plenty" (06/17/2015)  . Atrial fibrillation (HCC)   . CHF (congestive heart failure) (HCC)   . Chronic back pain   . Chronic kidney disease    02/17/16 Bun 34, creat 1.05  . Complication of anesthesia    "30 minutes violent shaking after a dental procedure"   . Degenerative disc disease, lumbar    . Diastolic heart failure (HCC)   . Gluten intolerance    . Heart murmur   . Hypertension   . Hypothyroidism    "from the aminodarone"  . Idiopathic scoliosis    . Osteoporosis   . Paroxysmal atrial fibrillation (HCC)   . Pleural effusion 2011   "hugh w/pneumonia"  . Pneumonia 2011; 11/2014  . Sinus bradycardia   . Stroke [facility])    TIA  . Thyroid disease   . TIA (transient ischemic attack) 2000's X 1  . Trigeminal neuralgia of left side of face 12/13/2014  . Unstable gait     Past Surgical History:  Procedure Laterality Date  . BI-VENTRICULAR IMPLANTABLE CARDIOVERTER DEFIBRILLATOR  (CRT-D)  06/17/2015  . CATARACT EXTRACTION W/ INTRAOCULAR LENS  IMPLANT, BILATERAL Bilateral 1991-1992  . EP IMPLANTABLE DEVICE N/A 06/17/2015   Procedure: Pacemaker Implant;  Surgeon: Duke Salvia, MD;  Location: [facility] INVASIVE CV LAB;  Service: Cardiovascular;  Laterality: N/A;  . LOOP RECORDER IMPLANT  08/2013   explanted 06/17/2015  . PACEMAKER INSERTION    . TOENAIL AVULSION Left    2nd digit  . TONSILLECTOMY  1944    Allergies  Allergen Reactions  . Codeine Other (See Comments)    unknown  .  Codeine Nausea And Vomiting    & headaches  . Levaquin [Levofloxacin In D5w] Itching  . Levaquin [Levofloxacin In D5w] Swelling and Other (See Comments)    & redness  . Loratadine Other (See Comments)    TACHYCARDIA  . Xylocaine [Lidocaine Hcl] Other (See Comments)    Uncontrolled shaking  . Xylocaine [Lidocaine Hcl] Other (See Comments)    Made her very cold & lots of shaking. Pt and daughter stated that she can take it.  . Loratadine Palpitations     Allergies as of 02/08/2018      Reactions   Codeine Other (See Comments)   unknown   Codeine Nausea And Vomiting   & headaches   Levaquin [levofloxacin In D5w] Itching   Levaquin [levofloxacin In D5w] Swelling, Other (See Comments)   & redness   Loratadine Other (See Comments)   TACHYCARDIA   Xylocaine [lidocaine Hcl] Other (See Comments)   Uncontrolled shaking   Xylocaine [lidocaine Hcl] Other (See Comments)   Made her very cold & lots of shaking. Pt and daughter stated that she can take it.   Loratadine Palpitations      Medication List        Accurate as of 02/08/18  4:00 PM. Always use your most recent med list.          acetaminophen 325 MG tablet Commonly known as:  TYLENOL Take 650 mg by mouth every 6 (six) hours as needed for mild pain or fever.   AMERIGEL CARE Lotn Apply 1 application topically daily as needed.   ARTIFICIAL TEAR OP Apply 1 drop to eye every 6 (six) hours as needed. For dry eyes   fexofenadine 180 MG tablet Commonly known as:  ALLEGRA Take 180 mg by mouth daily as needed.   furosemide 20 MG tablet Commonly known as:  LASIX Take 20 mg by mouth as directed. Take 40mg  at 9AM and 20mg  at [facility]. Hold if SBP <110   levothyroxine 75 MCG tablet Commonly known as:  SYNTHROID, LEVOTHROID Take 1 tablet (75 mcg total) by mouth daily before breakfast. Reported on 02/04/2016   LORazepam 0.5 MG tablet Commonly known as:  ATIVAN Take 0.25 mg by mouth 2 (two) times daily.   magnesium hydroxide 400 MG/5ML suspension Commonly known as:  MILK OF MAGNESIA Take 30 mLs by mouth as needed for mild constipation.   metoprolol tartrate 25 MG tablet Commonly known as:  LOPRESSOR Take 0.5 tablets (12.5 mg total) by mouth 2 (two) times daily.   OXYGEN Inhale 2 L/min into the lungs.   risperiDONE 0.25 MG tablet Commonly known as:  RISPERDAL Take 0.25 mg by mouth 2 (two) times daily.   ROXANOL PO Take 25 mLs by mouth every 6 (six) hours as needed.        Review of Systems  Unable to perform ROS: Other (additional information provided by patient's Nurse. )    Immunization History  Administered Date(s) Administered  . Influenza-Unspecified 10/08/2014, 09/23/2015, 10/05/2016, 10/04/2017  . PPD Test 06/22/2014, 06/25/2014, 06/11/2015, 01/11/2016  . Pneumococcal Polysaccharide-23 08/21/2017  . Td 03/05/2014   Pertinent  Health Maintenance Due  Topic Date Due  . DEXA SCAN  10/27/2020 (Originally 01[birth year]18/1986)  . PNA vac Low Risk Adult (2 of 2 - PCV13) 08/21/2018  . INFLUENZA VACCINE  Completed   Fall Risk  08/15/2017 02/18/2016 04/24/2015  Falls in the past year? No No Yes  Number falls in past yr: - - 2 or more  Injury with Fall? - -  Yes  Risk Factor Category  - - High Fall Risk  Risk for fall due to : - - History of fall(s);Impaired balance/gait  Follow up - - Falls evaluation completed   Functional Status Survey:    Vitals:   02/08/18 1047  BP: 129/77  Pulse: 95  Resp: 18  Temp: 97.9 F (36.6 C)  SpO2: 100%  Weight: 98 lb 14.4 oz (44.9 kg)  Height: 4\' 7"  (1.397 m)   Body mass index is 22.99 kg/m. Physical Exam  Constitutional:  Frail elderly in no acute distress  HENT:  Head: Normocephalic.  Right Ear: External ear normal.  Left Ear: External ear normal.  Mouth/Throat: Oropharynx is clear and moist. No oropharyngeal exudate.  HOH wears hearing aids   Eyes: Conjunctivae and EOM are normal. Pupils are equal, round, and reactive to light. Right eye exhibits no discharge. Left eye exhibits no discharge.  Eye glasses in place   Neck: Normal range of motion. No JVD present. No thyromegaly present.  Cardiovascular: Intact distal pulses. Exam reveals no gallop and no friction rub.  Murmur heard. Pulmonary/Chest: Effort normal and breath sounds normal. No respiratory distress. She has no wheezes. She has no rales.  Oxygen 2 Liters via nasal cannula in place.   Abdominal: Soft. Bowel sounds are normal. She exhibits no  distension. There is no tenderness. There is no rebound and no guarding.  Musculoskeletal: She exhibits no tenderness.  Moves x 4 extremities.generalized edema   Lymphadenopathy:    She has no cervical adenopathy.  Neurological: She is alert. Coordination normal.  Opens eyes to verbal commands.   Skin: Skin is warm and dry. No rash noted. No erythema. No pallor.  Psychiatric: She has a normal mood and affect. Her behavior is normal.   Labs reviewed: Recent Labs    03/24/17 1429  04/02/17 2027 04/04/17 0334 04/05/17 0415  08/21/17 09/06/17 12/03/17  NA  --    < > 133* 134* 136   < > 130* 139 133*  K  --    < > 4.6 4.5 5.3*   < > 4.5 4.4 4.1  CL  --    < > 102 105 107  --   --   --   --   CO2  --    < > 24 23 23   --   --   --   --   GLUCOSE  --    < > 107* 84 67  --   --   --   --   BUN  --    < > 14 12 12    < > 22* 31* 19  CREATININE  --    < > 0.65 0.61 0.62   < > 0.8 0.9 0.9  CALCIUM  --    < > 8.9 8.1* 8.3*  --   --   --   --   MG 1.9  --   --   --   --   --   --   --   --   PHOS 3.6  --   --   --   --   --   --   --   --    < > = values in this interval not displayed.   Recent Labs    03/24/17 1138 04/02/17 2027 07/06/17 07/26/17 08/21/17  AST 30 27 22 22 24   ALT 16 20 17 16 19   ALKPHOS 64 68 68 62 50  BILITOT 0.6  0.7  --   --   --   PROT 5.7* 6.2*  --   --   --   ALBUMIN 3.4* 3.8  --   --   --    Recent Labs    04/02/17 2027 04/04/17 0334 04/05/17 0415  07/26/17 08/21/17 09/06/17  WBC 8.2 6.9 9.5   < > 6.6 8.1 10.8  HGB 12.4 11.2* 12.2   < > 10.3* 10.9* 12.9  HCT 35.4* 32.7* 35.2*   < > 31* 32* 38  MCV 90.8 94.5 94.4  --   --   --   --   PLT 280 231 220   < > 207 211 207   < > = values in this interval not displayed.   Lab Results  Component Value Date   TSH 1.07 10/04/2017   Significant Diagnostic Results in last 30 days:  No results found.  Assessment/Plan 1. Chronic diastolic CHF (congestive heart failure)  Weight stable.Generalized edema persist.  Exam findings negative for cough,shortness of breath,wheezing or rales.continue on Furosemide 20 mg tablet and metoprolol 12.5 mg tablet twice daily.Facility staff to lay patient back to bed one hour after meals and elevated lower extremities while in bed to alleviate edema. Continue to monitor weight. Continue on Hospice service.   2. Paroxysmal atrial fibrillation  HR stable.continue on metoprolol 12.5 mg tablet twice daily  3. Hypothyroidism Continue on levothyroxine 75 mcg tablet daily. Monitor TSH level.   4. Cognitive impairment Progressive decline. Aspiration precaution.Speech Therapy to evaluate swallowing.continue to assist with feeding and oral care.   Family/ staff Communication: Reviewed plan of care with patient and facility Nurse.   Labs/tests ordered: None   Floy Riegler C Jamar Casagrande, NP

## 2018-02-19 ENCOUNTER — Encounter: Payer: Self-pay | Admitting: Family

## 2018-02-19 ENCOUNTER — Non-Acute Institutional Stay (SKILLED_NURSING_FACILITY): Payer: Medicare Other | Admitting: Family

## 2018-02-19 DIAGNOSIS — L89152 Pressure ulcer of sacral region, stage 2: Secondary | ICD-10-CM | POA: Diagnosis not present

## 2018-02-19 DIAGNOSIS — R5381 Other malaise: Secondary | ICD-10-CM

## 2018-02-19 DIAGNOSIS — R638 Other symptoms and signs concerning food and fluid intake: Secondary | ICD-10-CM | POA: Diagnosis not present

## 2018-02-19 NOTE — Progress Notes (Signed)
Location:  [facility] Room Number: 6 Place of Service:  SNF (31) Provider: Dinah Ngetich FNP-C  Oneal Grout, MD  Patient Care Team: Oneal Grout, MD as PCP - General (Internal Medicine) Duke Salvia, MD as Consulting Physician (Cardiology) Corky Crafts, MD as Consulting Physician (Interventional Cardiology) Sheral Apley, MD as Attending Physician (Orthopedic Surgery)  Extended Emergency Contact Information Primary Emergency Contact: McNeill,Dr. Robin Searing [place] Home Phone: [phone] Work Phone: [phone] Mobile Phone: [phone] Relation: Daughter  Code Status:  DNR Goals of care: Advanced Directive information Advanced Directives 02/19/2018  Does Patient Have a Medical Advance Directive? Yes  Type of Advance Directive Out of facility DNR (pink MOST or yellow form);Living will  Does patient want to make changes to medical advance directive? -  Copy of Healthcare Power of Attorney in Chart? -  Would patient like information on creating a medical advance directive? -  Pre-existing out of facility DNR order (yellow form or pink MOST form) Yellow form placed in chart (order not valid for inpatient use);Pink MOST form placed in chart (order not valid for inpatient use)     Chief Complaint  Patient presents with  . Acute Visit    check open areas on coccyx     HPI:  Pt is a 84 y.o. female seen today at [facility] for an acute visit for evaluation of ulcer on coccyx area.she is seen in her room today with facility Nurse and Hospice Nurse at bedside.Facility Nurse reports patient continue to decline in condition.she is unable to take any oral medication or food.Hospice Nurse conducted Patient's daughter who is aware of patient's progressive decline in condition and agrees to discontinue oral medications and provide comfort feeds only.   Past Medical History:  Diagnosis Date  . Acute encephalopathy 03/24/2017    . Acute on chronic diastolic heart failure (HCC)   . AICD (automatic cardioverter/defibrillator) present   . Arthritis    "plenty" (06/17/2015)  . Atrial fibrillation (HCC)   . CHF (congestive heart failure) (HCC)   . Chronic back pain   . Chronic kidney disease    02/17/16 Bun 34, creat 1.05  . Complication of anesthesia    "30 minutes violent shaking after a dental procedure"   . Degenerative disc disease, lumbar    . Diastolic heart failure (HCC)   . Gluten intolerance    . Heart murmur   . Hypertension   . Hypothyroidism    "from the aminodarone"  . Idiopathic scoliosis    . Osteoporosis   . Paroxysmal atrial fibrillation (HCC)   . Pleural effusion 2011   "hugh w/pneumonia"  . Pneumonia 2011; 11/2014  . Sinus bradycardia   . Stroke [facility])    TIA  . Thyroid disease   . TIA (transient ischemic attack) 2000's X 1  . Trigeminal neuralgia of left side of face 12/13/2014  . Unstable gait     Past Surgical History:  Procedure Laterality Date  . BI-VENTRICULAR IMPLANTABLE CARDIOVERTER DEFIBRILLATOR  (CRT-D)  06/17/2015  . CATARACT EXTRACTION W/ INTRAOCULAR LENS  IMPLANT, BILATERAL Bilateral 1991-1992  . EP IMPLANTABLE DEVICE N/A 06/17/2015   Procedure: Pacemaker Implant;  Surgeon: Duke Salvia, MD;  Location: [facility] INVASIVE CV LAB;  Service: Cardiovascular;  Laterality: N/A;  . LOOP RECORDER IMPLANT  08/2013   explanted 06/17/2015  . PACEMAKER INSERTION    . TOENAIL AVULSION Left    2nd digit  . TONSILLECTOMY  1944  Allergies  Allergen Reactions  . Codeine Other (See Comments)    unknown  . Codeine Nausea And Vomiting    & headaches  . Levaquin [Levofloxacin In D5w] Itching  . Levaquin [Levofloxacin In D5w] Swelling and Other (See Comments)    & redness  . Loratadine Other (See Comments)    TACHYCARDIA  . Xylocaine [Lidocaine Hcl] Other (See Comments)    Uncontrolled shaking  . Xylocaine [Lidocaine Hcl] Other (See Comments)    Made her very cold & lots of shaking.  Pt and daughter stated that she can take it.  . Loratadine Palpitations    Outpatient Encounter Medications as of 02/19/2018  Medication Sig  . acetaminophen (TYLENOL) 325 MG tablet Take 650 mg by mouth every 6 (six) hours as needed for mild pain or fever.   . ARTIFICIAL TEAR OP Apply 1 drop to eye every 6 (six) hours as needed. For dry eyes  . fexofenadine (ALLEGRA) 180 MG tablet Take 180 mg by mouth daily as needed.   . furosemide (LASIX) 20 MG tablet Take 20 mg by mouth as directed. Take 40mg  at 9AM and 20mg  at [facility]. Hold if SBP <110  . levothyroxine (SYNTHROID, LEVOTHROID) 75 MCG tablet Take 1 tablet (75 mcg total) by mouth daily before breakfast. Reported on 02/04/2016  . LORazepam (ATIVAN) 0.5 MG tablet Take 0.25 mg by mouth as directed. Take 0.25 mg twice daily. May also take 0.25 mg at bedtime as needed.  . magnesium hydroxide (MILK OF MAGNESIA) 400 MG/5ML suspension Take 30 mLs by mouth as needed for mild constipation.  . metoprolol tartrate (LOPRESSOR) 25 MG tablet Take 0.5 tablets (12.5 mg total) by mouth 2 (two) times daily.  . Morphine Sulfate (ROXANOL PO) Take 25 mLs by mouth every 6 (six) hours as needed.  . OXYGEN Inhale 2 L/min into the lungs.  . risperiDONE (RISPERDAL) 0.25 MG tablet Take 0.25 mg by mouth 2 (two) times daily.  . Skin Protectants, Misc. (AMERIGEL CARE) LOTN Apply 1 application topically daily as needed.    No facility-administered encounter medications on file as of 02/19/2018.     Review of Systems  Unable to perform ROS: Acuity of condition (additional information provided by facility Nurse and Hospice Nurse.)    Immunization History  Administered Date(s) Administered  . Influenza-Unspecified 10/08/2014, 09/23/2015, 10/05/2016, 10/04/2017  . PPD Test 06/22/2014, 06/25/2014, 06/11/2015, 01/11/2016  . Pneumococcal Polysaccharide-23 08/21/2017  . Td 03/05/2014   Pertinent  Health Maintenance Due  Topic Date Due  . DEXA SCAN  10/27/2020 (Originally  01[birth year]/1986)  . PNA vac Low Risk Adult (2 of 2 - PCV13) 08/21/2018  . INFLUENZA VACCINE  Completed   Fall Risk  08/15/2017 02/18/2016 04/24/2015  Falls in the past year? No No Yes  Number falls in past yr: - - 2 or more  Injury with Fall? - - Yes  Risk Factor Category  - - High Fall Risk  Risk for fall due to : - - History of fall(s);Impaired balance/gait  Follow up - - Falls evaluation completed    Vitals:   02/19/18 1034  BP: 115/72  Pulse: (!) 104  Resp: 18  Temp: 98.9 F (37.2 C)  SpO2: 91%  Weight: 98 lb 14.4 oz (44.9 kg)  Height: 4\' 7"  (1.397 m)   Body mass index is 22.99 kg/m. Physical Exam  Constitutional:  Frail elderly non-verbal in no acute distress   HENT:  Head: Normocephalic.  Mouth/Throat: Oropharynx is clear and moist. No oropharyngeal exudate.  Eyes: Conjunctivae are normal. Pupils are equal, round, and reactive to light. Right eye exhibits no discharge. Left eye exhibits no discharge. No scleral icterus.  Neck: Neck supple. No JVD present. No thyromegaly present.  Cardiovascular: Intact distal pulses. Exam reveals no gallop and no friction rub.  Murmur heard. Pulmonary/Chest: Effort normal and breath sounds normal. No respiratory distress. She has no wheezes. She has no rales.  Abdominal: Soft. Bowel sounds are normal. She exhibits no distension. There is no tenderness. There is no rebound and no guarding.  Musculoskeletal: She exhibits edema. She exhibits no tenderness.  Lymphadenopathy:    She has no cervical adenopathy.  Neurological: She is alert.  Nonverbal during visit   Skin: Skin is warm and dry. No rash noted. No pallor.  Stage 2 pressure ulcer to coccyx area wound bed red surrounding skin tissues without any signs of infections.   Psychiatric: She has a normal mood and affect. Her behavior is normal.   Labs reviewed: Recent Labs    03/24/17 1429  04/02/17 2027 04/04/17 0334 04/05/17 0415  08/21/17 09/06/17 12/03/17  NA  --    < > 133* 134*  136   < > 130* 139 133*  K  --    < > 4.6 4.5 5.3*   < > 4.5 4.4 4.1  CL  --    < > 102 105 107  --   --   --   --   CO2  --    < > 24 23 23   --   --   --   --   GLUCOSE  --    < > 107* 84 67  --   --   --   --   BUN  --    < > 14 12 12    < > 22* 31* 19  CREATININE  --    < > 0.65 0.61 0.62   < > 0.8 0.9 0.9  CALCIUM  --    < > 8.9 8.1* 8.3*  --   --   --   --   MG 1.9  --   --   --   --   --   --   --   --   PHOS 3.6  --   --   --   --   --   --   --   --    < > = values in this interval not displayed.   Recent Labs    03/24/17 1138 04/02/17 2027 07/06/17 07/26/17 08/21/17  AST 30 27 22 22 24   ALT 16 20 17 16 19   ALKPHOS 64 68 68 62 50  BILITOT 0.6 0.7  --   --   --   PROT 5.7* 6.2*  --   --   --   ALBUMIN 3.4* 3.8  --   --   --    Recent Labs    04/02/17 2027 04/04/17 0334 04/05/17 0415  07/26/17 08/21/17 09/06/17  WBC 8.2 6.9 9.5   < > 6.6 8.1 10.8  HGB 12.4 11.2* 12.2   < > 10.3* 10.9* 12.9  HCT 35.4* 32.7* 35.2*   < > 31* 32* 38  MCV 90.8 94.5 94.4  --   --   --   --   PLT 280 231 220   < > 207 211 207   < > = values in this interval not displayed.   Lab Results  Component Value Date  TSH 1.07 10/04/2017   Significant Diagnostic Results in last 30 days:  No results found.  Assessment/Plan 1. Pressure ulcer of coccygeal region, stage 2 Afebrile.wound bed red surrounding skin tissues without any signs of infections.cleanse ulcer with saline pat dry,cover with foam dressing for protection and to provide extra cushion.change dressing every 3 days and as needed if soiled.Provide air mattress overlay.continue to monitor for worsening or signs of infections.   2. Decreased oral intake Progressive decline.Discontinue all oral medication for now. Provide comfort feeds only per patient's POA.  3. Physical deconditioning Decline expected.change Roxanol 20 mg/ml oral solution to 5 mg (0.25 ml) by mouth every 4 hours as needed for pain and shortness of breath.Ativan 0.25 mg  tablet SL every 4 hours as needed for anxiety.continue to follow up with hospice service.  Family/ staff Communication: Reviewed plan of care with patient and facility Nurse.  Labs/tests ordered: None   Dinah C Ngetich, NP

## 2018-03-25 DEATH — deceased
